# Patient Record
Sex: Male | Born: 1965 | Race: Black or African American | Hispanic: No | Marital: Married
Health system: Southern US, Community
[De-identification: ages and names within clinical notes are randomized; demographics above are authoritative.]

## PROBLEM LIST (undated history)

## (undated) DIAGNOSIS — I517 Cardiomegaly: Secondary | ICD-10-CM

## (undated) DIAGNOSIS — E559 Vitamin D deficiency, unspecified: Secondary | ICD-10-CM

## (undated) DIAGNOSIS — I219 Acute myocardial infarction, unspecified: Secondary | ICD-10-CM

## (undated) DIAGNOSIS — I639 Cerebral infarction, unspecified: Secondary | ICD-10-CM

## (undated) DIAGNOSIS — I10 Essential (primary) hypertension: Secondary | ICD-10-CM

## (undated) HISTORY — PX: ANTERIOR CRUCIATE LIGAMENT REPAIR: SHX115

## (undated) HISTORY — PX: LAPAROTOMY: SHX154

## (undated) HISTORY — PX: GASTRIC RESECTION: SHX5248

## (undated) HISTORY — DX: Cerebral infarction, unspecified: I63.9

---

## 2017-04-27 ENCOUNTER — Inpatient Hospital Stay
Admission: EM | Admit: 2017-04-27 | Discharge: 2017-04-27 | DRG: 304 | Disposition: A | Discharge status: Other Acute Inpatient Hospital | Payer: BC Managed Care – PPO | Attending: Internal Medicine | Admitting: Internal Medicine

## 2017-04-27 ENCOUNTER — Other Ambulatory Visit: Payer: Self-pay

## 2017-04-27 ENCOUNTER — Inpatient Hospital Stay: Admit: 2017-04-27 | Payer: BC Managed Care – PPO

## 2017-04-27 ENCOUNTER — Inpatient Hospital Stay: Payer: BC Managed Care – PPO

## 2017-04-27 ENCOUNTER — Emergency Department: Payer: BC Managed Care – PPO

## 2017-04-27 DIAGNOSIS — I7100 Dissection of unspecified site of aorta: Secondary | ICD-10-CM

## 2017-04-27 DIAGNOSIS — J90 Pleural effusion, not elsewhere classified: Secondary | ICD-10-CM | POA: Diagnosis present

## 2017-04-27 DIAGNOSIS — J810 Acute pulmonary edema: Secondary | ICD-10-CM | POA: Diagnosis not present

## 2017-04-27 DIAGNOSIS — N19 Unspecified kidney failure: Secondary | ICD-10-CM

## 2017-04-27 DIAGNOSIS — Z833 Family history of diabetes mellitus: Secondary | ICD-10-CM

## 2017-04-27 DIAGNOSIS — N179 Acute kidney failure, unspecified: Secondary | ICD-10-CM

## 2017-04-27 DIAGNOSIS — I161 Hypertensive emergency: Secondary | ICD-10-CM | POA: Diagnosis not present

## 2017-04-27 DIAGNOSIS — I10 Essential (primary) hypertension: Secondary | ICD-10-CM

## 2017-04-27 DIAGNOSIS — R778 Other specified abnormalities of plasma proteins: Secondary | ICD-10-CM

## 2017-04-27 DIAGNOSIS — I160 Hypertensive urgency: Secondary | ICD-10-CM | POA: Diagnosis not present

## 2017-04-27 DIAGNOSIS — R7989 Other specified abnormal findings of blood chemistry: Secondary | ICD-10-CM

## 2017-04-27 HISTORY — DX: Essential (primary) hypertension: I10

## 2017-04-27 LAB — CBC WITH DIFFERENTIAL/PLATELET
BASOS ABS: 0.1 10*3/uL (ref 0–0.1)
Basophils Relative: 1 %
Eosinophils Absolute: 0.5 10*3/uL (ref 0–0.7)
Eosinophils Relative: 4 %
HEMATOCRIT: 46.2 % (ref 40.0–52.0)
Hemoglobin: 15 g/dL (ref 13.0–18.0)
LYMPHS PCT: 13 %
Lymphs Abs: 1.5 10*3/uL (ref 1.0–3.6)
MCH: 27.5 pg (ref 26.0–34.0)
MCHC: 32.5 g/dL (ref 32.0–36.0)
MCV: 84.6 fL (ref 80.0–100.0)
MONO ABS: 0.8 10*3/uL (ref 0.2–1.0)
Monocytes Relative: 7 %
Neutro Abs: 8.7 10*3/uL — ABNORMAL HIGH (ref 1.4–6.5)
Neutrophils Relative %: 75 %
Platelets: 230 10*3/uL (ref 150–440)
RBC: 5.45 MIL/uL (ref 4.40–5.90)
RDW: 15.1 % — AB (ref 11.5–14.5)
WBC: 11.6 10*3/uL — ABNORMAL HIGH (ref 3.8–10.6)

## 2017-04-27 LAB — BASIC METABOLIC PANEL
Anion gap: 11 (ref 5–15)
BUN: 40 mg/dL — ABNORMAL HIGH (ref 6–20)
CO2: 18 mmol/L — AB (ref 22–32)
Calcium: 8.1 mg/dL — ABNORMAL LOW (ref 8.9–10.3)
Chloride: 106 mmol/L (ref 101–111)
Creatinine, Ser: 3.94 mg/dL — ABNORMAL HIGH (ref 0.61–1.24)
GFR calc Af Amer: 19 mL/min — ABNORMAL LOW (ref >60)
GFR, EST NON AFRICAN AMERICAN: 16 mL/min — AB (ref >60)
GLUCOSE: 131 mg/dL — AB (ref 65–99)
POTASSIUM: 4.3 mmol/L (ref 3.5–5.1)
Sodium: 135 mmol/L (ref 135–145)

## 2017-04-27 LAB — COMPREHENSIVE METABOLIC PANEL
ALBUMIN: 4.1 g/dL (ref 3.5–5.0)
ALT: 52 U/L (ref 17–63)
AST: 73 U/L — AB (ref 15–41)
Alkaline Phosphatase: 82 U/L (ref 38–126)
Anion gap: 11 (ref 5–15)
BUN: 41 mg/dL — AB (ref 6–20)
CHLORIDE: 108 mmol/L (ref 101–111)
CO2: 21 mmol/L — ABNORMAL LOW (ref 22–32)
Calcium: 8.7 mg/dL — ABNORMAL LOW (ref 8.9–10.3)
Creatinine, Ser: 4.26 mg/dL — ABNORMAL HIGH (ref 0.61–1.24)
GFR calc Af Amer: 17 mL/min — ABNORMAL LOW (ref >60)
GFR, EST NON AFRICAN AMERICAN: 15 mL/min — AB (ref >60)
GLUCOSE: 121 mg/dL — AB (ref 65–99)
POTASSIUM: 3.9 mmol/L (ref 3.5–5.1)
Sodium: 140 mmol/L (ref 135–145)
Total Bilirubin: 0.6 mg/dL (ref 0.3–1.2)
Total Protein: 6.8 g/dL (ref 6.5–8.1)

## 2017-04-27 LAB — TROPONIN I
TROPONIN I: 0.12 ng/mL — AB (ref <0.03)
TROPONIN I: 0.11 ng/mL — AB (ref <0.03)

## 2017-04-27 LAB — CBC
HCT: 43.1 % (ref 40.0–52.0)
Hemoglobin: 13.7 g/dL (ref 13.0–18.0)
MCH: 27 pg (ref 26.0–34.0)
MCHC: 31.7 g/dL — ABNORMAL LOW (ref 32.0–36.0)
MCV: 85.1 fL (ref 80.0–100.0)
PLATELETS: 202 10*3/uL (ref 150–440)
RBC: 5.06 MIL/uL (ref 4.40–5.90)
RDW: 15.2 % — ABNORMAL HIGH (ref 11.5–14.5)
WBC: 11.1 10*3/uL — ABNORMAL HIGH (ref 3.8–10.6)

## 2017-04-27 LAB — HEPARIN LEVEL (UNFRACTIONATED)

## 2017-04-27 LAB — PROTIME-INR
INR: 1
Prothrombin Time: 13.1 seconds (ref 11.4–15.2)

## 2017-04-27 LAB — MRSA PCR SCREENING: MRSA by PCR: NEGATIVE

## 2017-04-27 LAB — GLUCOSE, CAPILLARY: Glucose-Capillary: 117 mg/dL — ABNORMAL HIGH (ref 65–99)

## 2017-04-27 LAB — APTT: APTT: 37 s — AB (ref 24–36)

## 2017-04-27 MED ORDER — ACETAMINOPHEN 650 MG RE SUPP: 650.0000 mg | Freq: Four times a day (QID) | RECTAL | Status: DC | PRN

## 2017-04-27 MED ORDER — ACETAMINOPHEN 500 MG PO TABS
ORAL_TABLET | ORAL | Status: AC
  Administered 2017-04-27: 1000 mg via ORAL
  Filled 2017-04-27: qty 2

## 2017-04-27 MED ORDER — HEPARIN BOLUS VIA INFUSION
4000.0000 [IU] | Freq: Once | INTRAVENOUS | Status: AC
  Administered 2017-04-27: 4000 [IU] via INTRAVENOUS
  Filled 2017-04-27: qty 4000

## 2017-04-27 MED ORDER — LABETALOL HCL 5 MG/ML IV SOLN
20.0000 mg | Freq: Once | INTRAVENOUS | Status: AC
  Administered 2017-04-27: 20 mg via INTRAVENOUS
  Filled 2017-04-27: qty 4

## 2017-04-27 MED ORDER — SENNOSIDES-DOCUSATE SODIUM 8.6-50 MG PO TABS: 1.0000 | ORAL_TABLET | Freq: Every evening | ORAL | Status: DC | PRN

## 2017-04-27 MED ORDER — NICARDIPINE HCL IN NACL 20-0.86 MG/200ML-% IV SOLN
3.0000 mg/h | INTRAVENOUS | Status: DC
  Administered 2017-04-27: 8 mg/h via INTRAVENOUS
  Administered 2017-04-27: 10 mg/h via INTRAVENOUS
  Administered 2017-04-27: 5 mg/h via INTRAVENOUS
  Filled 2017-04-27 (×3): qty 200

## 2017-04-27 MED ORDER — SODIUM CHLORIDE 0.9 % IV SOLN: 250.0000 mL | INTRAVENOUS | Status: DC | PRN

## 2017-04-27 MED ORDER — ASPIRIN EC 81 MG PO TBEC: 81.0000 mg | DELAYED_RELEASE_TABLET | Freq: Every day | ORAL | Status: DC

## 2017-04-27 MED ORDER — ACETAMINOPHEN 500 MG PO TABS
1000.0000 mg | ORAL_TABLET | Freq: Once | ORAL | Status: AC
Start: 2017-04-27 — End: 2017-04-27
  Administered 2017-04-27: 1000 mg via ORAL

## 2017-04-27 MED ORDER — ONDANSETRON HCL 4 MG PO TABS: 4.0000 mg | ORAL_TABLET | Freq: Four times a day (QID) | ORAL | Status: DC | PRN

## 2017-04-27 MED ORDER — HYDRALAZINE HCL 20 MG/ML IJ SOLN: 10.0000 mg | INTRAMUSCULAR | Status: DC | PRN

## 2017-04-27 MED ORDER — SODIUM CHLORIDE 0.9% FLUSH: 3.0000 mL | Freq: Two times a day (BID) | INTRAVENOUS | Status: DC

## 2017-04-27 MED ORDER — ONDANSETRON HCL 4 MG/2ML IJ SOLN: 4.0000 mg | Freq: Four times a day (QID) | INTRAMUSCULAR | Status: DC | PRN

## 2017-04-27 MED ORDER — ACETAMINOPHEN 325 MG PO TABS: 650.0000 mg | ORAL_TABLET | Freq: Four times a day (QID) | ORAL | Status: DC | PRN

## 2017-04-27 MED ORDER — IOPAMIDOL (ISOVUE-370) INJECTION 76%
125.0000 mL | Freq: Once | INTRAVENOUS | Status: AC | PRN
  Administered 2017-04-27: 125 mL via INTRAVENOUS

## 2017-04-27 MED ORDER — NITROGLYCERIN IN D5W 200-5 MCG/ML-% IV SOLN
0.0000 ug/min | INTRAVENOUS | Status: DC
Start: 2017-04-27 — End: 2017-04-27
  Administered 2017-04-27: 5 ug/min via INTRAVENOUS
  Filled 2017-04-27: qty 250

## 2017-04-27 MED ORDER — NITROGLYCERIN IN D5W 200-5 MCG/ML-% IV SOLN: 0.0000 ug/min | INTRAVENOUS | Status: DC

## 2017-04-27 MED ORDER — SODIUM CHLORIDE 0.9% FLUSH: 3.0000 mL | INTRAVENOUS | Status: DC | PRN

## 2017-04-27 MED ORDER — HEPARIN (PORCINE) IN NACL 100-0.45 UNIT/ML-% IJ SOLN
1100.0000 [IU]/h | INTRAMUSCULAR | Status: DC
  Administered 2017-04-27: 1100 [IU]/h via INTRAVENOUS
  Filled 2017-04-27: qty 250

## 2017-04-27 NOTE — Consult Note (Signed)
Name: [name] MRN: [id number] DOB: 05/26/1965    ADMISSION DATE:  04/27/2017 CONSULTATION DATE: 04/27/2016  REFERRING MD : Dr. Estanislado Pandy   CHIEF COMPLAINT: Left arm numbness and tingling   BRIEF PATIENT DESCRIPTION:  52 yo male admitted 01/3 with possible amitriptyline reaction, acute renal failure, elevated troponin, and hypertensive urgency requiring nitroglycerin gtt   SIGNIFICANT EVENTS  01/3-Pt admitted to stepdown unit   STUDIES:  CT Angio Chest/Abd/Pel 01/3>>No evidence of aneurysm or dissection of the thoracic or abdominal aorta. Major aortic branch vessels are unremarkable. No evidence of significant pulmonary embolus. Small bilateral pleural effusions. Interstitial edema in the lung bases. Cardiac enlargement with reflux of contrast material into the hepatic veins suggesting right heart failure. No acute process demonstrated in the abdomen pelvis. CT Head 01/3>>Chronic small vessel ischemia without evidence of acute intracranial abnormality. Chronic right frontal sinusitis.  HISTORY OF PRESENT ILLNESS:   This is a 52 yo male with no PMH denies alcohol or recreation drug use and is not a tobacco user presented to [facility] ER 01/3 with c/o left arm numbness and tingling onset of symptoms 2 hours prior to arrival to the ER.  Per ER notes the pt stated he normally does not take any medications, however 5 days ago he had an upper respiratory infection resulting in insomnia.  Therefore, he went to Urgent Care on 01/2 and was prescribed amitriptyline for sleep.  He took his first dose of amitriptyline on 04/26/17 at 09:00 am and was able to fall asleep.  However, on the morning of 01/3 around 01:00 am he noticed left arm numbness and tingling prompting current ER visit.  In the ER his blood pressure was 255/177 and heart rate 118, therefore nitroglycerin gtt initiated. Lab results revealed the pt was in acute renal failure with creatinine 4.26, initial troponin 0.12, and CXR revealing  pulmonary edema.  He was subsequently admitted to the stepdown unit by hospitalist team for further workup and treatment.   PAST MEDICAL HISTORY :   has no past medical history on file.  has no past surgical history on file. Prior to Admission medications   Medication Sig Start Date End Date Taking? Authorizing Provider  amitriptyline (ELAVIL) 25 MG tablet Take 25 mg by mouth at bedtime.   Yes [provider]   No Known Allergies  FAMILY HISTORY:  family history is not on file. SOCIAL HISTORY:    REVIEW OF SYSTEMS: Positives in BOLD  Constitutional: Negative for fever, chills, weight loss, malaise/fatigue and diaphoresis.  HENT: Negative for hearing loss, ear pain, nosebleeds, congestion, sore throat, neck pain, tinnitus and ear discharge.   Eyes: Negative for blurred vision, double vision, photophobia, pain, discharge and redness.  Respiratory: Negative for cough, hemoptysis, sputum production, shortness of breath, wheezing and stridor.   Cardiovascular: Negative for chest pain, palpitations, orthopnea, claudication, leg swelling and PND.  Gastrointestinal: Negative for heartburn, nausea, vomiting, abdominal pain, diarrhea, constipation, blood in stool and melena.  Genitourinary: Negative for dysuria, urgency, frequency, hematuria and flank pain.  Musculoskeletal: Negative for myalgias, back pain, joint pain and falls.  Skin: Negative for itching and rash.  Neurological: left arm numbness and tingling, dizziness, tingling, tremors, sensory change, speech change, focal weakness, seizures, loss of consciousness, weakness and headaches.  Endo/Heme/Allergies: Negative for environmental allergies and polydipsia. Does not bruise/bleed easily.  SUBJECTIVE:  Pt primary complaint is intermittent headache.  VITAL SIGNS: Temp:  [98.1 F (36.7 C)] 98.1 F (36.7 C) (01/03 0223) Pulse Rate:  [  111-118] 111 (01/03 0300) Resp:  [18-20] 18 (01/03 0300) BP: (247-265)/(177-188) 247/181  (01/03 0300) SpO2:  [97 %-99 %] 97 % (01/03 0300) Weight:  [94.3 kg (208 lb)] 94.3 kg (208 lb) (01/03 0223)  PHYSICAL EXAMINATION: General: well developed, well nourished male, NAD  Neuro: alert and oriented, follows commands  HEENT: supple, no JVD Cardiovascular: sinus tach, no M/R/G Lungs: clear throughout, even, non labored  Abdomen: +BS x4, soft, non distended, non tender  Musculoskeletal: normal bulk and tone, no edema  Skin: intact no rashes or lesions   Recent Labs  Lab 04/27/17 0250  NA 140  K 3.9  CL 108  CO2 21*  BUN 41*  CREATININE 4.26*  GLUCOSE 121*   Recent Labs  Lab 04/27/17 0250  HGB 15.0  HCT 46.2  WBC 11.6*  PLT 230   Ct Head Wo Contrast  Result Date: 04/27/2017 CLINICAL DATA:  Altered level of consciousness. EXAM: CT HEAD WITHOUT CONTRAST TECHNIQUE: Contiguous axial images were obtained from the base of the skull through the vertex without intravenous contrast. COMPARISON:  None. FINDINGS: Brain: Brain volume is normal for age. Periventricular white matter hypodensity is nonspecific, commonly secondary to chronic small vessel ischemia. No intracranial hemorrhage, mass effect, or midline shift. No hydrocephalus. Cavum septum pellucidum, normal variant. The basilar cisterns are patent. No evidence of territorial infarct or acute ischemia. No extra-axial or intracranial fluid collection. Vascular: No hyperdense vessel or unexpected calcification. Skull: No fracture or focal lesion. Sinuses/Orbits: Complete opacification of left frontal sinus with internal septations and mild surrounding bony scleroses, a suggesting chronic sinusitis. Scattered mucosal thickening of the ethmoid air cells. Mastoid air cells are well-aerated. Visualized orbits are unremarkable. Other: None. IMPRESSION: 1. Chronic small vessel ischemia without evidence of acute intracranial abnormality. 2. Chronic right frontal sinusitis. Electronically Signed   By: Jeb Levering M.D.   On: 04/27/2017  03:48   Dg Chest Port 1 View  Result Date: 04/27/2017 CLINICAL DATA:  Initial evaluation for acute chest pain. EXAM: PORTABLE CHEST 1 VIEW COMPARISON:  None available. FINDINGS: Cardiomegaly.  Mediastinal silhouette normal. Lungs mildly hypoinflated. Mild basilar predominant interstitial edema. Suspected trace bilateral pleural effusions. No focal infiltrates. No pneumothorax. Visualized osseous structures within normal limits. IMPRESSION: 1. Cardiomegaly with mild basilar predominant interstitial edema. 2. Suspected small bilateral pleural effusions. Electronically Signed   By: Jeannine Boga M.D.   On: 04/27/2017 03:25   Ct Angio Chest/abd/pel For Dissection W And/or W/wo  Result Date: 04/27/2017 CLINICAL DATA:  Patient fell asleep laying on the left side and when he woke up his left arm was tingling and numb. This lasted for about 30 minutes. No residual symptoms. Elevated blood pressure. Chest pain. EXAM: CT ANGIOGRAPHY CHEST, ABDOMEN AND PELVIS TECHNIQUE: Multidetector CT imaging through the chest, abdomen and pelvis was performed using the standard protocol during bolus administration of intravenous contrast. Multiplanar reconstructed images and MIPs were obtained and reviewed to evaluate the vascular anatomy. CONTRAST:  174mL ISOVUE-370 IOPAMIDOL (ISOVUE-370) INJECTION 76% COMPARISON:  None. FINDINGS: CTA CHEST FINDINGS Cardiovascular: Noncontrast images of the chest demonstrate no evidence of significant aortic calcification or intramural hematoma. Images obtained during arterial phase of contrast injection demonstrate normal caliber thoracic aorta. No aortic dissection. Great vessel origins are patent. Cardiac enlargement. No pericardial effusion. Pulmonary artery's are well opacified. No evidence of significant pulmonary embolus. Mediastinum/Nodes: 1.5 cm diameter low-attenuation in the pretracheal space may represent a prominent lymph node or more likely pericardial reflection. No definite  evidence of significant  mediastinal lymphadenopathy. Esophagus is decompressed. Lungs/Pleura: Small bilateral pleural effusions with basilar atelectasis. Mild interstitial pattern to the lung bases may represent edema. Airways are patent. No pneumothorax. Musculoskeletal: Small lipoma in the right lateral chest wall inferiorly. No acute or significant osseous findings. Review of the MIP images confirms the above findings. CTA ABDOMEN AND PELVIS FINDINGS VASCULAR Aorta: Normal caliber aorta without aneurysm, dissection, vasculitis or significant stenosis. Celiac: Patent without evidence of aneurysm, dissection, vasculitis or significant stenosis. SMA: Patent without evidence of aneurysm, dissection, vasculitis or significant stenosis. Renals: Both renal arteries are patent without evidence of aneurysm, dissection, vasculitis, fibromuscular dysplasia or significant stenosis. IMA: Patent without evidence of aneurysm, dissection, vasculitis or significant stenosis. Inflow: Patent without evidence of aneurysm, dissection, vasculitis or significant stenosis. Veins: No obvious venous abnormality within the limitations of this arterial phase study. Review of the MIP images confirms the above findings. NON-VASCULAR Hepatobiliary: No focal liver abnormality is seen. No gallstones, gallbladder wall thickening, or biliary dilatation. Contrast material refluxed into the hepatic veins may represent right heart failure. Pancreas: Unremarkable. No pancreatic ductal dilatation or surrounding inflammatory changes. Spleen: Normal in size without focal abnormality. Adrenals/Urinary Tract: Adrenal glands are unremarkable. Kidneys are normal, without renal calculi, focal lesion, or hydronephrosis. Bladder is unremarkable. Stomach/Bowel: Stomach is within normal limits. Appendix appears normal. No evidence of bowel wall thickening, distention, or inflammatory changes. Lymphatic: No significant lymphadenopathy. Reproductive: Prostate is  unremarkable. Other: No abdominal wall hernia or abnormality. No abdominopelvic ascites. Musculoskeletal: No acute or significant osseous findings. Review of the MIP images confirms the above findings. IMPRESSION: 1. No evidence of aneurysm or dissection of the thoracic or abdominal aorta. Major aortic branch vessels are unremarkable. No evidence of significant pulmonary embolus. 2. Small bilateral pleural effusions. Interstitial edema in the lung bases. Cardiac enlargement with reflux of contrast material into the hepatic veins suggesting right heart failure. 3. No acute process demonstrated in the abdomen pelvis. Electronically Signed   By: Lucienne Capers M.D.   On: 04/27/2017 03:50    ASSESSMENT / PLAN: Hypertensive Urgency Acute renal failure Pulmonary edema  Possible medication reaction  P: Prn O2 for dyspnea and/or hypoxia Repeat CXR in am  Continuous telemetry monitoring Trend troponin's  Will discontinue nitroglycerin gtt and start nicardipine gtt systolic goal <353 Prn hydralazine to maintain systolic >614 and/or diastolic <431  Continue heparin gtt dosing per pharmacy  Echo pending  Nephrology and Cardiology consulted appreciate input  Trend CBC Monitor for s/sx of bleeding  Transfuse for hgb <7 Trend BMP Replace electrolytes as indicated  Monitor UOP Avoid nephrotoxic medications  Renal US results pending  Marda Stalker, [place] Pager [phone] (please enter 7 digits) PCCM Consult Pager [phone] (please enter 7 digits)

## 2017-04-27 NOTE — Progress Notes (Signed)
ANTICOAGULATION CONSULT NOTE - Initial Consult  Pharmacy Consult for heparin Indication: chest pain/ACS  No Known Allergies  Patient Measurements: Height: 6' (182.9 cm) Weight: 208 lb (94.3 kg) IBW/kg (Calculated) : 77.6 Heparin Dosing Weight: 94.3 kg  Vital Signs: Temp: 98.1 F (36.7 C) (01/03 0223) Temp Source: Oral (01/03 0223) BP: 215/170 (01/03 0435) Pulse Rate: 86 (01/03 0435)  Labs: Recent Labs    04/27/17 0250  HGB 15.0  HCT 46.2  PLT 230  CREATININE 4.26*  TROPONINI 0.12*    Estimated Creatinine Clearance: 23.9 mL/min (A) (by C-G formula based on SCr of 4.26 mg/dL (H)).   Medical History: History reviewed. No pertinent past medical history.  Medications:  Scheduled:  . heparin  4,000 Units Intravenous Once    Assessment: Patient admitted for tingling in right arm after falling asleep, CT negative for acute infarct. Patient is in hypertensive crisis w/ trops up to 0.12.  Patient is being started on heparin  Goal of Therapy:  Heparin level 0.3-0.7 units/ml Monitor platelets by anticoagulation protocol: Yes   Plan:  Will bolus w/ heparin 4000 units IV x 1 Will start drip @ 1100 units/hr  Will draw HL @ 1100. Baseline labs ordered. Will monitor daily CBC's and adjust per HL's  Tobie Lords, PharmD, BCPS Clinical Pharmacist 04/27/2017

## 2017-04-27 NOTE — ED Notes (Addendum)
Pt reports headache. MD made aware

## 2017-04-27 NOTE — Progress Notes (Addendum)
[place] up per [employer] for transfer to [facility] for Cardiovascular Thoracic Surgery consult and treatment. Family with patient. [place], who is out of the country, called and questions answered.

## 2017-04-27 NOTE — ED Notes (Signed)
Nitro titrated to 20 mcg/min.

## 2017-04-27 NOTE — Discharge Summary (Signed)
Physician Discharge Summary  Patient ID: OSCEOLA DEPAZ MRN: [id number] DOB/AGE: May 21, 1965 52 y.o.  Admit date: 04/27/2017 Discharge date: 04/27/2017  Admission Diagnoses:HTN emergency with aortic dissection  Discharge Diagnoses:  Active Problems:   Hypertensive urgency   Discharged Condition: stable  Hospital Course Admitted to ICU Nicardipine infusion    Significant Diagnostic Studies:  CTA chest +aortic dissection  Discharge Exam: Blood pressure 134/87, pulse 88, temperature 98.7 F (37.1 C), resp. rate 15, height 6' (1.829 m), weight 208 lb 1.8 oz (94.4 kg), SpO2 95 %.   Disposition TRANSFER TO [facility]  NICARDIPINE INFUSION BP goal SBP 120  Case discussed with [facility] at [facility], they refused patient transferred, [facility] CT surgery has accepted patient and will be transferred to [facility] ER   Signed: Flora Lipps 04/27/2017, 9:19 AM

## 2017-04-27 NOTE — Consult Note (Signed)
[facility] CONSULT NOTE    Date: 04/27/2017                  Patient Name:  AQEEL NORGAARD  MRN: [id number]  DOB: 1965-10-15  Age / Sex: 52 y.o., male         PCP: Tommy Medal, MD                 Service Requesting Consult: Pulmonary/critical care                 Reason for Consult: Acute renal failure            History of Present Illness: Patient is a 52 y.o. male with no significant past medical hisotry, who was admitted to [facility] on 04/27/2017 for evaluation of hypertensive crisis, left arm numbness.  The patient presented with extremely elevated blood pressure.  The highest blood pressure recording during his inpatient stay was 265/188.  He was subsequently started on a nicardipine drip.  His baseline renal function is unknown however his creatinine is currently 3.9.  Renal ultrasound was performed which showed increased echogenicity bilateral.  Patient also had a CT angiogram of the chest which reveals an unusual pattern for descending aortic dissection.  The patient is currently in the process of being transferred to [facility].  He remains on a nicardipine drip at the moment.  His blood pressure has come down significantly.   Medications: Outpatient medications: Medications Prior to Admission  Medication Sig Dispense Refill Last Dose  . amitriptyline (ELAVIL) 25 MG tablet Take 25 mg by mouth at bedtime.   04/26/2017 at Unknown time    Current medications: Current Facility-Administered Medications  Medication Dose Route Frequency Provider Last Rate Last Dose  . 0.9 %  sodium chloride infusion  250 mL Intravenous PRN Pyreddy, Reatha Harps, MD      . acetaminophen (TYLENOL) tablet 650 mg  650 mg Oral Q6H PRN Pyreddy, Reatha Harps, MD       Or  . acetaminophen (TYLENOL) suppository 650 mg  650 mg Rectal Q6H PRN Saundra Shelling, MD      . aspirin EC tablet 81 mg  81 mg Oral Daily Pyreddy, Pavan, MD      . heparin ADULT infusion 100 units/mL  (25000 units/255mL sodium chloride 0.45%)  1,100 Units/hr Intravenous Continuous Saundra Shelling, MD 11 mL/hr at 04/27/17 0638 1,100 Units/hr at 04/27/17 2703  . hydrALAZINE (APRESOLINE) injection 10 mg  10 mg Intravenous Q4H PRN Awilda Bill, NP      . nicardipine (CARDENE) 20mg  in 0.86% saline 261ml IV infusion (0.1 mg/ml)  3-15 mg/hr Intravenous Continuous Awilda Bill, NP 80 mL/hr at 04/27/17 0800 8 mg/hr at 04/27/17 0800  . ondansetron (ZOFRAN) tablet 4 mg  4 mg Oral Q6H PRN Pyreddy, Reatha Harps, MD       Or  . ondansetron (ZOFRAN) injection 4 mg  4 mg Intravenous Q6H PRN Pyreddy, Pavan, MD      . senna-docusate (Senokot-S) tablet 1 tablet  1 tablet Oral QHS PRN Pyreddy, Pavan, MD      . sodium chloride flush (NS) 0.9 % injection 3 mL  3 mL Intravenous Q12H Pyreddy, Pavan, MD      . sodium chloride flush (NS) 0.9 % injection 3 mL  3 mL Intravenous PRN Saundra Shelling, MD          Allergies: No Known Allergies    Past Medical History: History reviewed. No pertinent past  medical history.   Family History: Family History  Problem Relation Age of Onset  . Diabetes Mellitus II Mother   . Diabetes Mellitus II Father      Social History: Social History   Socioeconomic History  . Marital status: Married    Spouse name: Not on file  . Number of children: Not on file  . Years of education: Not on file  . Highest education level: Not on file  Social Needs  . Financial resource strain: Not on file  . Food insecurity - worry: Not on file  . Food insecurity - inability: Not on file  . Transportation needs - medical: Not on file  . Transportation needs - non-medical: Not on file  Occupational History  . Not on file  Tobacco Use  . Smoking status: Never Smoker  . Smokeless tobacco: Never Used  Substance and Sexual Activity  . Alcohol use: No    Frequency: Never  . Drug use: No  . Sexual activity: Not on file  Other Topics Concern  . Not on file  Social History Narrative   . Not on file     Review of Systems: Review of Systems  Constitutional: Positive for malaise/fatigue. Negative for chills, fever and weight loss.  HENT: Negative for hearing loss and nosebleeds.   Eyes: Negative for blurred vision and double vision.  Respiratory: Negative for cough, hemoptysis and sputum production.   Cardiovascular: Negative for chest pain, orthopnea and leg swelling.  Gastrointestinal: Negative for abdominal pain, heartburn, nausea and vomiting.  Genitourinary: Negative for dysuria, frequency and urgency.  Musculoskeletal: Negative for myalgias and neck pain.  Skin: Negative for itching and rash.  Neurological: Positive for tingling.  Endo/Heme/Allergies: Negative for polydipsia. Does not bruise/bleed easily.  Psychiatric/Behavioral: Negative for depression. The patient is nervous/anxious.      Vital Signs: Blood pressure 132/75, pulse 87, temperature 98.7 F (37.1 C), resp. rate (!) 21, height 6' (1.829 m), weight 94.4 kg (208 lb 1.8 oz), SpO2 96 %.  Weight trends: Filed Weights   04/27/17 0223 04/27/17 0628  Weight: 94.3 kg (208 lb) 94.4 kg (208 lb 1.8 oz)    Physical Exam: General: NAD, sitting up in bed  Head: Normocephalic, atraumatic.  Eyes: Anicteric, EOMI  Nose: Mucous membranes moist, not inflammed, nonerythematous.  Throat: Oropharynx nonerythematous, no exudate appreciated.   Neck: Supple, trachea midline.  Lungs:  Normal respiratory effort, basilar rales  Heart: RRR. S1 and S2 normal without gallop, murmur, or rubs.  Abdomen:  BS normoactive. Soft, Nondistended, non-tender.  No masses or organomegaly.  Extremities: No pretibial edema.  Neurologic: A&O X3, Motor strength is 5/5 in the all 4 extremities  Skin: No visible rashes, scars.    Lab results: Basic Metabolic Panel: Recent Labs  Lab 04/27/17 0250 04/27/17 0623  NA 140 135  K 3.9 4.3  CL 108 106  CO2 21* 18*  GLUCOSE 121* 131*  BUN 41* 40*  CREATININE 4.26* 3.94*   CALCIUM 8.7* 8.1*    Liver Function Tests: Recent Labs  Lab 04/27/17 0250  AST 73*  ALT 52  ALKPHOS 82  BILITOT 0.6  PROT 6.8  ALBUMIN 4.1   No results for input(s): LIPASE, AMYLASE in the last 168 hours. No results for input(s): AMMONIA in the last 168 hours.  CBC: Recent Labs  Lab 04/27/17 0250 04/27/17 0623  WBC 11.6* 11.1*  NEUTROABS 8.7*  --   HGB 15.0 13.7  HCT 46.2 43.1  MCV 84.6 85.1  PLT  230 202    Cardiac Enzymes: Recent Labs  Lab 04/27/17 0250 04/27/17 0623  TROPONINI 0.12* 0.11*    BNP: Invalid input(s): POCBNP  CBG: Recent Labs  Lab 04/27/17 0621  GLUCAP 117*    Microbiology: Results for orders placed or performed during the hospital encounter of 04/27/17  MRSA PCR Screening     Status: None   Collection Time: 04/27/17  6:21 AM  Result Value Ref Range Status   MRSA by PCR NEGATIVE NEGATIVE Final    Comment:        The GeneXpert MRSA Assay (FDA approved for NASAL specimens only), is one component of a comprehensive MRSA colonization surveillance program. It is not intended to diagnose MRSA infection nor to guide or monitor treatment for MRSA infections. Performed at [facility], [address] [place], [place] [address]     Coagulation Studies: Recent Labs    04/27/17 0250  LABPROT 13.1  INR 1.00    Urinalysis: No results for input(s): COLORURINE, LABSPEC, PHURINE, GLUCOSEU, HGBUR, BILIRUBINUR, KETONESUR, PROTEINUR, UROBILINOGEN, NITRITE, LEUKOCYTESUR in the last 72 hours.  Invalid input(s): APPERANCEUR    Imaging: Ct Head Wo Contrast  Result Date: 04/27/2017 CLINICAL DATA:  Altered level of consciousness. EXAM: CT HEAD WITHOUT CONTRAST TECHNIQUE: Contiguous axial images were obtained from the base of the skull through the vertex without intravenous contrast. COMPARISON:  None. FINDINGS: Brain: Brain volume is normal for age. Periventricular white matter hypodensity is nonspecific, commonly secondary to  chronic small vessel ischemia. No intracranial hemorrhage, mass effect, or midline shift. No hydrocephalus. Cavum septum pellucidum, normal variant. The basilar cisterns are patent. No evidence of territorial infarct or acute ischemia. No extra-axial or intracranial fluid collection. Vascular: No hyperdense vessel or unexpected calcification. Skull: No fracture or focal lesion. Sinuses/Orbits: Complete opacification of left frontal sinus with internal septations and mild surrounding bony scleroses, a suggesting chronic sinusitis. Scattered mucosal thickening of the ethmoid air cells. Mastoid air cells are well-aerated. Visualized orbits are unremarkable. Other: None. IMPRESSION: 1. Chronic small vessel ischemia without evidence of acute intracranial abnormality. 2. Chronic right frontal sinusitis. Electronically Signed   By: Jeb Levering M.D.   On: 04/27/2017 03:48   US Renal  Result Date: 04/27/2017 CLINICAL DATA:  Renal failure. EXAM: RENAL / URINARY TRACT ULTRASOUND COMPLETE COMPARISON:  None. FINDINGS: Right Kidney: Length: 9.7 cm. Increased renal echogenicity. Small cyst in the mid lateral lower pole measure less than 1 cm. No solid mass or hydronephrosis visualized. Left Kidney: Length: 9.6 cm. Increased renal echogenicity. No mass or hydronephrosis visualized. Bladder: Appears normal for degree of bladder distention. Both ureteral jets are visualized. Bilateral pleural effusions. IMPRESSION: 1. Increased bilateral renal echogenicity suggesting chronic medical renal disease. 2. No obstructive uropathy. Electronically Signed   By: Jeb Levering M.D.   On: 04/27/2017 06:39   Dg Chest Port 1 View  Result Date: 04/27/2017 CLINICAL DATA:  Initial evaluation for acute chest pain. EXAM: PORTABLE CHEST 1 VIEW COMPARISON:  None available. FINDINGS: Cardiomegaly.  Mediastinal silhouette normal. Lungs mildly hypoinflated. Mild basilar predominant interstitial edema. Suspected trace bilateral pleural  effusions. No focal infiltrates. No pneumothorax. Visualized osseous structures within normal limits. IMPRESSION: 1. Cardiomegaly with mild basilar predominant interstitial edema. 2. Suspected small bilateral pleural effusions. Electronically Signed   By: Jeannine Boga M.D.   On: 04/27/2017 03:25   Ct Angio Chest/abd/pel For Dissection W And/or W/wo  Addendum Date: 04/27/2017   ADDENDUM REPORT: 04/27/2017 08:18 ADDENDUM: Contrast within the descending thoracic aorta  and abdominal aorta has an unusual pattern and suggestive for an atypical aortic dissection. There is suggestion for a dissection flap involving the celiac trunk on sequence 5, image 100. There is also unusual low-density within the aorta near the celiac trunk and SMA on images 94 and 102. There may also be a dissection flap extending into the main right renal artery. Concern for dissection extending into the common iliac arteries bilaterally. Sagittal images suggest a dissection flap in the aortic arch on sequence 8, image 90. Difficult to exclude dissection involving the aortic root or ascending thoracic aorta due to the atypical appearance of the dissection elsewhere in the aorta. Consider further evaluation of the ascending thoracic aorta with echocardiography or ECG gated CTA or MRA. In summary, findings are highly concerning for an atypical aortic dissection involving the arch, descending thoracic aorta, abdominal aorta and iliac arteries. Difficult to exclude involvement of the ascending thoracic aorta due to the atypical appearance of this dissection. Critical Value/emergent results were called by telephone at the time of interpretation on 04/27/2017 at 8:17 am to Dr. Flora Lipps, who verbally acknowledged these results. Electronically Signed   By: Markus Daft M.D.   On: 04/27/2017 08:18   Result Date: 04/27/2017 CLINICAL DATA:  Patient fell asleep laying on the left side and when he woke up his left arm was tingling and numb. This lasted  for about 30 minutes. No residual symptoms. Elevated blood pressure. Chest pain. EXAM: CT ANGIOGRAPHY CHEST, ABDOMEN AND PELVIS TECHNIQUE: Multidetector CT imaging through the chest, abdomen and pelvis was performed using the standard protocol during bolus administration of intravenous contrast. Multiplanar reconstructed images and MIPs were obtained and reviewed to evaluate the vascular anatomy. CONTRAST:  176mL ISOVUE-370 IOPAMIDOL (ISOVUE-370) INJECTION 76% COMPARISON:  None. FINDINGS: CTA CHEST FINDINGS Cardiovascular: Noncontrast images of the chest demonstrate no evidence of significant aortic calcification or intramural hematoma. Images obtained during arterial phase of contrast injection demonstrate normal caliber thoracic aorta. No aortic dissection. Great vessel origins are patent. Cardiac enlargement. No pericardial effusion. Pulmonary artery's are well opacified. No evidence of significant pulmonary embolus. Mediastinum/Nodes: 1.5 cm diameter low-attenuation in the pretracheal space may represent a prominent lymph node or more likely pericardial reflection. No definite evidence of significant mediastinal lymphadenopathy. Esophagus is decompressed. Lungs/Pleura: Small bilateral pleural effusions with basilar atelectasis. Mild interstitial pattern to the lung bases may represent edema. Airways are patent. No pneumothorax. Musculoskeletal: Small lipoma in the right lateral chest wall inferiorly. No acute or significant osseous findings. Review of the MIP images confirms the above findings. CTA ABDOMEN AND PELVIS FINDINGS VASCULAR Aorta: Normal caliber aorta without aneurysm, dissection, vasculitis or significant stenosis. Celiac: Patent without evidence of aneurysm, dissection, vasculitis or significant stenosis. SMA: Patent without evidence of aneurysm, dissection, vasculitis or significant stenosis. Renals: Both renal arteries are patent without evidence of aneurysm, dissection, vasculitis, fibromuscular  dysplasia or significant stenosis. IMA: Patent without evidence of aneurysm, dissection, vasculitis or significant stenosis. Inflow: Patent without evidence of aneurysm, dissection, vasculitis or significant stenosis. Veins: No obvious venous abnormality within the limitations of this arterial phase study. Review of the MIP images confirms the above findings. NON-VASCULAR Hepatobiliary: No focal liver abnormality is seen. No gallstones, gallbladder wall thickening, or biliary dilatation. Contrast material refluxed into the hepatic veins may represent right heart failure. Pancreas: Unremarkable. No pancreatic ductal dilatation or surrounding inflammatory changes. Spleen: Normal in size without focal abnormality. Adrenals/Urinary Tract: Adrenal glands are unremarkable. Kidneys are normal, without renal calculi, focal lesion, or  hydronephrosis. Bladder is unremarkable. Stomach/Bowel: Stomach is within normal limits. Appendix appears normal. No evidence of bowel wall thickening, distention, or inflammatory changes. Lymphatic: No significant lymphadenopathy. Reproductive: Prostate is unremarkable. Other: No abdominal wall hernia or abnormality. No abdominopelvic ascites. Musculoskeletal: No acute or significant osseous findings. Review of the MIP images confirms the above findings. IMPRESSION: 1. No evidence of aneurysm or dissection of the thoracic or abdominal aorta. Major aortic branch vessels are unremarkable. No evidence of significant pulmonary embolus. 2. Small bilateral pleural effusions. Interstitial edema in the lung bases. Cardiac enlargement with reflux of contrast material into the hepatic veins suggesting right heart failure. 3. No acute process demonstrated in the abdomen pelvis. Electronically Signed: By: Lucienne Capers M.D. On: 04/27/2017 03:50      Assessment & Plan: Pt is a 52 y.o. male with no significant past medical hisotry, who was admitted to [facility] on 04/27/2017 for evaluation of hypertensive  crisis, left arm numbness.  1.  Acute renal failure, though suspect some element of chronicity. 2.  Hypertensive crisis, on nicardipine drip. 3.  Descending aortic dissection. 4.  Elevated troponin.   Plan:  We were asked to see the patient for acute renal failure.  This is in light of hypertensive crisis which is likely contributing to some element of renal insufficiency.  He will need monitoring of his renal function as he was exposed to contrast during this admission.  No urgent indication for dialysis at the moment.  He will require outpatient follow-up for his acute renal failure and suspected underlying chronic kidney disease as well.  He is in the process of being transferred to [facility].  I did advise the patient to follow-up in our office after discharge.  He verbalized understanding of this.  Continue nicardipine during the transfer process to [facility].  Thanks for consultation.

## 2017-04-27 NOTE — ED Provider Notes (Signed)
[facility] Emergency Department Provider Note  ____________________________________________   First MD Initiated Contact with Patient 04/27/17 0235     (approximate)  I have reviewed the triage vital signs and the nursing notes.   HISTORY  Chief Complaint Medication Reaction    HPI [name] is a 52 y.o. male who self presents to the emergency department with numbness and tingling to his left arm that began roughly 2 hours prior to arrival.  The patient has no past medical history and takes no medications normally.  He says for the past 5 days ago he has had an upper respiratory tract infection and has had difficulty going to sleep.  He went to an urgent care yesterday who prescribed him amitriptyline.  This morning at 9 he took his first dose of amitriptyline and he was able to get some sleep today.  He woke up this morning and 1 in the morning and he thought that he may have slept on his arm wrong because his arm was numb.  His symptoms were sudden onset when he awoke and have gradually improved on their own.  They seem to be worse by moving and somewhat improved with rest.  He denies chest pain or shortness of breath.  He denies abdominal pain nausea or vomiting.  He denies double vision or blurred vision.  He denies headache.  History reviewed. No pertinent past medical history.  There are no active problems to display for this patient.     Prior to Admission medications   Not on File    Allergies Patient has no known allergies.  No family history on file.  Social History Social History   Tobacco Use  . Smoking status: Not on file  Substance Use Topics  . Alcohol use: Not on file  . Drug use: Not on file    Review of Systems Constitutional: No fever/chills Eyes: No visual changes. ENT: No sore throat. Cardiovascular: Denies chest pain. Respiratory: Denies shortness of breath. Gastrointestinal: No abdominal pain.  No nausea, no  vomiting.  No diarrhea.  No constipation. Genitourinary: Negative for dysuria. Musculoskeletal: Negative for back pain. Skin: Negative for rash. Neurological: Positive for numbness and tingling   ____________________________________________   PHYSICAL EXAM:  VITAL SIGNS: ED Triage Vitals  Enc Vitals Group     BP 04/27/17 0225 (!) 255/177     Pulse Rate 04/27/17 0223 (!) 118     Resp 04/27/17 0223 20     Temp 04/27/17 0223 98.1 F (36.7 C)     Temp Source 04/27/17 0223 Oral     SpO2 04/27/17 0223 98 %     Weight 04/27/17 0223 208 lb (94.3 kg)     Height 04/27/17 0223 6' (1.829 m)     Head Circumference --      Peak Flow --      Pain Score --      Pain Loc --      Pain Edu? --      Excl. in [place]? --     Constitutional: Alert and oriented x4 pleasant cooperative speaks in full clear sentences no diaphoresis Head: Atraumatic. Nose: No congestion/rhinnorhea. Mouth/Throat: No trismus Neck: No stridor.   Cardiovascular: Tachycardic regular rhythm no murmurs Respiratory: Slightly increased respiratory effort with crackles at bilateral bases Gastrointestinal: Soft nontender Neurologic:  Normal speech and language. No gross focal neurologic deficits are appreciated.  Skin:  Skin is warm, dry and intact. No rash noted.    ____________________________________________  LABS (all labs ordered are listed, but only abnormal results are displayed)  Labs Reviewed  COMPREHENSIVE METABOLIC PANEL - Abnormal; Notable for the following components:      Result Value   CO2 21 (*)    Glucose, Bld 121 (*)    BUN 41 (*)    Creatinine, Ser 4.26 (*)    Calcium 8.7 (*)    AST 73 (*)    GFR calc non Af Amer 15 (*)    GFR calc Af Amer 17 (*)    All other components within normal limits  TROPONIN I - Abnormal; Notable for the following components:   Troponin I 0.12 (*)    All other components within normal limits  CBC WITH DIFFERENTIAL/PLATELET - Abnormal; Notable for the following  components:   WBC 11.6 (*)    RDW 15.1 (*)    Neutro Abs 8.7 (*)    All other components within normal limits    Lab work reviewed by me with acute kidney injury and elevated troponin __________________________________________  EKG  ED ECG REPORT I, Darel Hong, the attending physician, personally viewed and interpreted this ECG.  Date: 04/27/2017 EKG Time:  Rate: 111 Rhythm: Sinus tachycardia QRS Axis: normal Intervals: normal ST/T Wave abnormalities: Left ventricular hypertrophy with repolarization abnormalities.  He does have some inferior ST depression likely secondary to demand Narrative Interpretation: No obvious ST elevation although he does appear to have ST depression concerning for demand  ____________________________________________  RADIOLOGY  Chest x-ray reviewed by me with acute pulmonary edema CT angios reviewed by me with pulmonary edema and bilateral pleural effusions but no dissection Head CT reviewed by me with no acute disease ____________________________________________   DIFFERENTIAL includes but not limited to  Acute pulmonary edema, aortic dissection, acute kidney injury, medication reaction   PROCEDURES  Procedure(s) performed: no  .Critical Care Performed by: Darel Hong, MD Authorized by: Darel Hong, MD   Critical care provider statement:    Critical care time (minutes):  40   Critical care time was exclusive of:  Separately billable procedures and treating other patients   Critical care was necessary to treat or prevent imminent or life-threatening deterioration of the following conditions:  Renal failure and circulatory failure   Critical care was time spent personally by me on the following activities:  Development of treatment plan with patient or surrogate, discussions with consultants, evaluation of patient's response to treatment, examination of patient, obtaining history from patient or surrogate, ordering and  performing treatments and interventions, ordering and review of laboratory studies, ordering and review of radiographic studies, pulse oximetry, re-evaluation of patient's condition and review of old charts    Critical Care performed: Yes  Observation: no ____________________________________________   INITIAL IMPRESSION / ASSESSMENT AND PLAN / ED COURSE  Pertinent labs & imaging results that were available during my care of the patient were reviewed by me and considered in my medical decision making (see chart for details).  On arrival the patient is quite well-appearing although with some crackles at bilateral bases and she is extraordinarily hypertensive and tachycardic.  His extreme hypertension and left arm symptoms does raise concern for acute aortic dissection so I will get a CT angiogram prior to blood work.  The patient's CT of his head and his chest appear normal to me.  His blood work is extremely abnormal with acute kidney injury and elevated troponin raising concern for hypertensive emergency.  As he does have pulmonary edema as well as start him on  a nitro drip and he will require inpatient admission.  The patient verbalizes understanding and agree with the plan.  I discussed with the hospitalist who is graciously agreed to admit the patient to his service.      ____________________________________________   FINAL CLINICAL IMPRESSION(S) / ED DIAGNOSES  Final diagnoses:  Hypertension, unspecified type  Acute pulmonary edema (HCC)  Elevated troponin  Acute kidney injury ([place])      NEW MEDICATIONS STARTED DURING THIS VISIT:  This SmartLink is deprecated. Use AVSMEDLIST instead to display the medication list for a patient.   Note:  This document was prepared using Dragon voice recognition software and may include unintentional dictation errors.      Darel Hong, MD 04/27/17 [id number]

## 2017-04-27 NOTE — ED Triage Notes (Signed)
Pt started on amitriptyline today took it at 1400 and fell asleep. States was laying on left side and when woke up his left arm felt tingly and numb, states lasted for about 30 min. States no symptoms at this time, just worried about the sensation.

## 2017-04-27 NOTE — ED Notes (Signed)
Per Dr. Mable Paris, no additional treatment needed for elevated labs related to kidney function (CT contrast administered prior to lab results previously per Dr. Mable Paris) after CT contrast.

## 2017-04-27 NOTE — ED Notes (Signed)
Nitro titrated up to 10 mcg/min.

## 2017-04-27 NOTE — ED Notes (Signed)
Nitro titrated to 25 mcg/min.

## 2017-04-27 NOTE — ED Notes (Signed)
Patient transported to Ultrasound 

## 2017-04-27 NOTE — H&P (Addendum)
[facility] [place] NAME: [name]    MR#:  [id number]  DATE OF BIRTH:  22-May-1965  DATE OF ADMISSION:  04/27/2017  PRIMARY CARE PHYSICIAN: Tommy Medal, MD   REQUESTING/REFERRING PHYSICIAN:   CHIEF COMPLAINT:   Chief Complaint  Patient presents with  . Medication Reaction    HISTORY OF PRESENT ILLNESS: [name]  is a 52 y.o. male with no significant past medical history presented to the emergency room for possible medication reaction.  Patient went to urgent care clinic for upper respiratory tract infection 5 days ago.  He was prescribed oral amitriptyline.  After he took the first dose yesterday morning he was able to get some sleep.  He woke up this morning and around 1 AM he thought it he slept on his left arm and his left arm was numb.  He presented to the emergency room with above complaints.  No weakness in any part of the body.  The numbness in the left arm resolved after he came to the emergency room. No complaints of any slurred speech or any difficulty swallowing food.  His blood pressure was high systolic more than 789 and diastolic around 381 mmHg.  Patient was started on IV nitroglycerin drip for uncontrolled hypertension.  Workup in the emergency room also was revealed renal failure.  No complaints of any palpitations, chest pain, blurry vision, denies any headache.  Hospitalist service was consulted for further care.  First set of troponin was also elevated.  CT angiogram of chest showed no dissection and no pulmonary embolism.  Bilateral pleural effusions were noted.  PAST MEDICAL HISTORY:   No history of CAD, diabetes, copd  PAST SURGICAL HISTORY:  Anterior cruciate ligament repair Laparotomy  SOCIAL HISTORY:  Social History   Tobacco Use  . Smoking status: Never Smoker  . Smokeless tobacco: Never Used  Substance Use Topics  . Alcohol use: No    Frequency: Never    FAMILY HISTORY:  Family  History  Problem Relation Age of Onset  . Diabetes Mellitus II Mother   . Diabetes Mellitus II Father     DRUG ALLERGIES: No Known Allergies  REVIEW OF SYSTEMS:   CONSTITUTIONAL: No fever, fatigue or weakness.  EYES: No blurred or double vision.  EARS, NOSE, AND THROAT: No tinnitus or ear pain.  RESPIRATORY: No cough, shortness of breath, wheezing or hemoptysis.  CARDIOVASCULAR: No chest pain, orthopnea, edema.  GASTROINTESTINAL: No nausea, vomiting, diarrhea or abdominal pain.  GENITOURINARY: No dysuria, hematuria.  ENDOCRINE: No polyuria, nocturia,  HEMATOLOGY: No anemia, easy bruising or bleeding SKIN: No rash or lesion. MUSCULOSKELETAL: No joint pain or arthritis.   NEUROLOGIC: No tingling,left arm numbness,  No weakness.  PSYCHIATRY: No anxiety or depression.   MEDICATIONS AT HOME:  Prior to Admission medications   Medication Sig Start Date End Date Taking? Authorizing Provider  amitriptyline (ELAVIL) 25 MG tablet Take 25 mg by mouth at bedtime.   Yes [provider]      PHYSICAL EXAMINATION:   VITAL SIGNS: Blood pressure (!) 215/170, pulse 86, temperature 98.1 F (36.7 C), temperature source Oral, resp. rate (!) 22, height 6' (1.829 m), weight 94.3 kg (208 lb), SpO2 99 %.  GENERAL:  52 y.o.-year-old patient lying in the bed with no acute distress.  EYES: Pupils equal, round, reactive to light and accommodation. No scleral icterus. Extraocular muscles intact.  HEENT: Head atraumatic, normocephalic. Oropharynx and nasopharynx clear.  NECK:  Supple, no jugular venous distention. No thyroid enlargement, no tenderness.  LUNGS: Normal breath sounds bilaterally, no wheezing, rales,rhonchi or crepitation. No use of accessory muscles of respiration.  CARDIOVASCULAR: S1, S2 normal. No murmurs, rubs, or gallops.  ABDOMEN: Soft, nontender, nondistended. Bowel sounds present. No organomegaly or mass.  EXTREMITIES: No pedal edema, cyanosis, or clubbing.  NEUROLOGIC:  Cranial nerves II through XII are intact. Muscle strength 5/5 in all extremities. Sensation intact. Gait not checked.  PSYCHIATRIC: The patient is alert and oriented x 3.  SKIN: No obvious rash, lesion, or ulcer.   LABORATORY PANEL:   CBC Recent Labs  Lab 04/27/17 0250  WBC 11.6*  HGB 15.0  HCT 46.2  PLT 230  MCV 84.6  MCH 27.5  MCHC 32.5  RDW 15.1*  LYMPHSABS 1.5  MONOABS 0.8  EOSABS 0.5  BASOSABS 0.1   ------------------------------------------------------------------------------------------------------------------  Chemistries  Recent Labs  Lab 04/27/17 0250  NA 140  K 3.9  CL 108  CO2 21*  GLUCOSE 121*  BUN 41*  CREATININE 4.26*  CALCIUM 8.7*  AST 73*  ALT 52  ALKPHOS 82  BILITOT 0.6   ------------------------------------------------------------------------------------------------------------------ estimated creatinine clearance is 23.9 mL/min (A) (by C-G formula based on SCr of 4.26 mg/dL (H)). ------------------------------------------------------------------------------------------------------------------ No results for input(s): TSH, T4TOTAL, T3FREE, THYROIDAB in the last 72 hours.  Invalid input(s): FREET3   Coagulation profile Recent Labs  Lab 04/27/17 0250  INR 1.00   ------------------------------------------------------------------------------------------------------------------- No results for input(s): DDIMER in the last 72 hours. -------------------------------------------------------------------------------------------------------------------  Cardiac Enzymes Recent Labs  Lab 04/27/17 0250  TROPONINI 0.12*   ------------------------------------------------------------------------------------------------------------------ Invalid input(s): POCBNP  ---------------------------------------------------------------------------------------------------------------  Urinalysis No results found for: COLORURINE, APPEARANCEUR, LABSPEC,  PHURINE, GLUCOSEU, HGBUR, BILIRUBINUR, KETONESUR, PROTEINUR, UROBILINOGEN, NITRITE, LEUKOCYTESUR   RADIOLOGY: Ct Head Wo Contrast  Result Date: 04/27/2017 CLINICAL DATA:  Altered level of consciousness. EXAM: CT HEAD WITHOUT CONTRAST TECHNIQUE: Contiguous axial images were obtained from the base of the skull through the vertex without intravenous contrast. COMPARISON:  None. FINDINGS: Brain: Brain volume is normal for age. Periventricular white matter hypodensity is nonspecific, commonly secondary to chronic small vessel ischemia. No intracranial hemorrhage, mass effect, or midline shift. No hydrocephalus. Cavum septum pellucidum, normal variant. The basilar cisterns are patent. No evidence of territorial infarct or acute ischemia. No extra-axial or intracranial fluid collection. Vascular: No hyperdense vessel or unexpected calcification. Skull: No fracture or focal lesion. Sinuses/Orbits: Complete opacification of left frontal sinus with internal septations and mild surrounding bony scleroses, a suggesting chronic sinusitis. Scattered mucosal thickening of the ethmoid air cells. Mastoid air cells are well-aerated. Visualized orbits are unremarkable. Other: None. IMPRESSION: 1. Chronic small vessel ischemia without evidence of acute intracranial abnormality. 2. Chronic right frontal sinusitis. Electronically Signed   By: Jeb Levering M.D.   On: 04/27/2017 03:48   Dg Chest Port 1 View  Result Date: 04/27/2017 CLINICAL DATA:  Initial evaluation for acute chest pain. EXAM: PORTABLE CHEST 1 VIEW COMPARISON:  None available. FINDINGS: Cardiomegaly.  Mediastinal silhouette normal. Lungs mildly hypoinflated. Mild basilar predominant interstitial edema. Suspected trace bilateral pleural effusions. No focal infiltrates. No pneumothorax. Visualized osseous structures within normal limits. IMPRESSION: 1. Cardiomegaly with mild basilar predominant interstitial edema. 2. Suspected small bilateral pleural effusions.  Electronically Signed   By: Jeannine Boga M.D.   On: 04/27/2017 03:25   Ct Angio Chest/abd/pel For Dissection W And/or W/wo  Result Date: 04/27/2017 CLINICAL DATA:  Patient fell asleep laying on the left side and when he woke up his left  arm was tingling and numb. This lasted for about 30 minutes. No residual symptoms. Elevated blood pressure. Chest pain. EXAM: CT ANGIOGRAPHY CHEST, ABDOMEN AND PELVIS TECHNIQUE: Multidetector CT imaging through the chest, abdomen and pelvis was performed using the standard protocol during bolus administration of intravenous contrast. Multiplanar reconstructed images and MIPs were obtained and reviewed to evaluate the vascular anatomy. CONTRAST:  163mL ISOVUE-370 IOPAMIDOL (ISOVUE-370) INJECTION 76% COMPARISON:  None. FINDINGS: CTA CHEST FINDINGS Cardiovascular: Noncontrast images of the chest demonstrate no evidence of significant aortic calcification or intramural hematoma. Images obtained during arterial phase of contrast injection demonstrate normal caliber thoracic aorta. No aortic dissection. Great vessel origins are patent. Cardiac enlargement. No pericardial effusion. Pulmonary artery's are well opacified. No evidence of significant pulmonary embolus. Mediastinum/Nodes: 1.5 cm diameter low-attenuation in the pretracheal space may represent a prominent lymph node or more likely pericardial reflection. No definite evidence of significant mediastinal lymphadenopathy. Esophagus is decompressed. Lungs/Pleura: Small bilateral pleural effusions with basilar atelectasis. Mild interstitial pattern to the lung bases may represent edema. Airways are patent. No pneumothorax. Musculoskeletal: Small lipoma in the right lateral chest wall inferiorly. No acute or significant osseous findings. Review of the MIP images confirms the above findings. CTA ABDOMEN AND PELVIS FINDINGS VASCULAR Aorta: Normal caliber aorta without aneurysm, dissection, vasculitis or significant stenosis.  Celiac: Patent without evidence of aneurysm, dissection, vasculitis or significant stenosis. SMA: Patent without evidence of aneurysm, dissection, vasculitis or significant stenosis. Renals: Both renal arteries are patent without evidence of aneurysm, dissection, vasculitis, fibromuscular dysplasia or significant stenosis. IMA: Patent without evidence of aneurysm, dissection, vasculitis or significant stenosis. Inflow: Patent without evidence of aneurysm, dissection, vasculitis or significant stenosis. Veins: No obvious venous abnormality within the limitations of this arterial phase study. Review of the MIP images confirms the above findings. NON-VASCULAR Hepatobiliary: No focal liver abnormality is seen. No gallstones, gallbladder wall thickening, or biliary dilatation. Contrast material refluxed into the hepatic veins may represent right heart failure. Pancreas: Unremarkable. No pancreatic ductal dilatation or surrounding inflammatory changes. Spleen: Normal in size without focal abnormality. Adrenals/Urinary Tract: Adrenal glands are unremarkable. Kidneys are normal, without renal calculi, focal lesion, or hydronephrosis. Bladder is unremarkable. Stomach/Bowel: Stomach is within normal limits. Appendix appears normal. No evidence of bowel wall thickening, distention, or inflammatory changes. Lymphatic: No significant lymphadenopathy. Reproductive: Prostate is unremarkable. Other: No abdominal wall hernia or abnormality. No abdominopelvic ascites. Musculoskeletal: No acute or significant osseous findings. Review of the MIP images confirms the above findings. IMPRESSION: 1. No evidence of aneurysm or dissection of the thoracic or abdominal aorta. Major aortic branch vessels are unremarkable. No evidence of significant pulmonary embolus. 2. Small bilateral pleural effusions. Interstitial edema in the lung bases. Cardiac enlargement with reflux of contrast material into the hepatic veins suggesting right heart  failure. 3. No acute process demonstrated in the abdomen pelvis. Electronically Signed   By: Lucienne Capers M.D.   On: 04/27/2017 03:50    EKG: Orders placed or performed during the hospital encounter of 04/27/17  . ED EKG  . ED EKG  . EKG 12-Lead  . EKG 12-Lead    IMPRESSION AND PLAN: 52 year old male patient with no significant past medical history presented to the emergency room with elevated blood pressure.  Admitting diagnosis 1.  Hypertensive urgency 2.  Acute renal failure 3.  Elevated troponin 4.  Pleural effusions Treatment plan Admit patient to stepdown unit Check echocardiogram to assess for heart failure Control blood pressure with IV nitroglycerin drip Cycle troponin IV  heparin drip for anticoagulation Renal ultrasound Nephrology consultation Cardiology and intensivist consultation Intensivist on call notiifed  All the records are reviewed and case discussed with ED provider. Management plans discussed with the patient, family and they are in agreement.  CODE STATUS: Full code Code Status History    This patient does not have a recorded code status. Please follow your organizational policy for patients in this situation.       TOTAL CRITICAL CARE TIME TAKING CARE OF THIS PATIENT: 1 minutes   Pavan Pyreddy M.D on 04/27/2017 at 5:01 AM  Between 7am to 6pm - Pager - [phone]  After 6pm go to [email or web address] - password [email or web address] [facility]  Office  [phone]  CC: Primary care physician; Tommy Medal, MD

## 2017-04-27 NOTE — ED Notes (Signed)
Nitro titrated to 5mcg/min.

## 2017-04-27 NOTE — Progress Notes (Signed)
I have not seen the patient.  Patient got transferred to [facility] for aortic dissection today

## 2017-04-27 NOTE — Progress Notes (Addendum)
0830 Informed via radiologist that patient has an ascending aortic aneurism. Dr. Mortimer Fries spoke with radiologist to confirm. After being informed by Dr. Mortimer Fries of his issues patient reassured that he would receive thye best of care. All clothes ,jewelry wallet etc sent with dad home. Wife is presently out of the country. 0930 report given to life flight Customer service manager.

## 2017-04-27 NOTE — ED Notes (Signed)
Called lab to add on PT and PTT.

## 2017-04-28 LAB — HIV ANTIBODY (ROUTINE TESTING W REFLEX): HIV SCREEN 4TH GENERATION: NONREACTIVE

## 2017-04-28 MED ORDER — CARVEDILOL 12.5 MG PO TABS
12.50 | ORAL_TABLET | ORAL | Status: DC
Start: 2017-04-28 — End: 2017-04-28

## 2017-04-28 MED ORDER — LIDOCAINE HCL (PF) 1 % IJ SOLN
.50 | INTRAMUSCULAR | Status: DC
Start: ? — End: 2017-04-28

## 2017-07-28 ENCOUNTER — Encounter: Payer: Self-pay | Admitting: *Deleted

## 2017-07-28 ENCOUNTER — Other Ambulatory Visit: Payer: Self-pay

## 2017-07-28 ENCOUNTER — Emergency Department
Admission: EM | Admit: 2017-07-28 | Discharge: 2017-07-28 | Disposition: A | Discharge status: Home / Self Care | Payer: BC Managed Care – PPO | Attending: Emergency Medicine | Admitting: Emergency Medicine

## 2017-07-28 ENCOUNTER — Emergency Department: Payer: BC Managed Care – PPO

## 2017-07-28 DIAGNOSIS — I10 Essential (primary) hypertension: Secondary | ICD-10-CM | POA: Insufficient documentation

## 2017-07-28 DIAGNOSIS — Y9301 Activity, walking, marching and hiking: Secondary | ICD-10-CM | POA: Diagnosis not present

## 2017-07-28 DIAGNOSIS — S63283A Dislocation of proximal interphalangeal joint of left middle finger, initial encounter: Secondary | ICD-10-CM | POA: Insufficient documentation

## 2017-07-28 DIAGNOSIS — W010XXA Fall on same level from slipping, tripping and stumbling without subsequent striking against object, initial encounter: Secondary | ICD-10-CM | POA: Diagnosis not present

## 2017-07-28 DIAGNOSIS — Y998 Other external cause status: Secondary | ICD-10-CM | POA: Diagnosis not present

## 2017-07-28 DIAGNOSIS — Z79899 Other long term (current) drug therapy: Secondary | ICD-10-CM | POA: Insufficient documentation

## 2017-07-28 DIAGNOSIS — S63259A Unspecified dislocation of unspecified finger, initial encounter: Secondary | ICD-10-CM

## 2017-07-28 DIAGNOSIS — S6992XA Unspecified injury of left wrist, hand and finger(s), initial encounter: Secondary | ICD-10-CM | POA: Diagnosis present

## 2017-07-28 DIAGNOSIS — Y9226 Movie house or cinema as the place of occurrence of the external cause: Secondary | ICD-10-CM | POA: Insufficient documentation

## 2017-07-28 MED ORDER — LIDOCAINE HCL (PF) 1 % IJ SOLN: 5.0000 mL | Freq: Once | INTRAMUSCULAR | Status: DC

## 2017-07-28 MED ORDER — LIDOCAINE HCL (PF) 1 % IJ SOLN
INTRAMUSCULAR | Status: AC
  Filled 2017-07-28: qty 5

## 2017-07-28 MED ORDER — TRAMADOL HCL 50 MG PO TABS: 50.0000 mg | ORAL_TABLET | Freq: Four times a day (QID) | ORAL | 0 refills | Status: DC | PRN

## 2017-07-28 MED ORDER — MELOXICAM 15 MG PO TABS: 15.0000 mg | ORAL_TABLET | Freq: Every day | ORAL | 0 refills | Status: DC

## 2017-07-28 NOTE — Discharge Instructions (Signed)
Please follow up with your primary care provider in about a week to check the strength of your finger without the splint.   Return to the ER for symptoms that change or worsen or for new concerns if unable to schedule an appointment.

## 2017-07-28 NOTE — ED Provider Notes (Signed)
[facility] Emergency Department Provider Note ____________________________________________  Time seen: Approximately 9:13 PM  I have reviewed the triage vital signs and the nursing notes.   HISTORY  Chief Complaint Finger Injury    HPI [name] is a 52 y.o. male who presents to the emergency department for evaluation and treatment of left hand injury after a mechanical, non-syncopal fall while [place] of the movie theater prior to arrival.  He states that his foot slipped and he reached back to catch himself and landed awkwardly on his hand.  His left middle finger is now deformed.  No alleviating measures were attempted prior to arrival.  Past Medical History:  Diagnosis Date  . Hypertension     Patient Active Problem List   Diagnosis Date Noted  . Hypertensive urgency 04/27/2017    Past Surgical History:  Procedure Laterality Date  . ANTERIOR CRUCIATE LIGAMENT REPAIR    . LAPAROTOMY      Prior to Admission medications   Medication Sig Start Date End Date Taking? Authorizing Provider  amitriptyline (ELAVIL) 25 MG tablet Take 25 mg by mouth at bedtime.    [provider]  meloxicam (MOBIC) 15 MG tablet Take 1 tablet (15 mg total) by mouth daily. 07/28/17   Vena Bassinger, Johnette Abraham B, FNP  traMADol (ULTRAM) 50 MG tablet Take 1 tablet (50 mg total) by mouth every 6 (six) hours as needed. 07/28/17   Victorino Dike, FNP    Allergies Patient has no known allergies.  Family History  Problem Relation Age of Onset  . Diabetes Mellitus II Mother   . Diabetes Mellitus II Father     Social History Social History   Tobacco Use  . Smoking status: Never Smoker  . Smokeless tobacco: Never Used  Substance Use Topics  . Alcohol use: No    Frequency: Never  . Drug use: No    Review of Systems Constitutional: Negative for fever. Cardiovascular: Negative for chest pain. Respiratory: Negative for shortness of breath. Musculoskeletal:  Positive for left hand pain Skin: Positive for abrasion to the left ring finger.  Neurological: Negative for decrease in sensation  ____________________________________________   PHYSICAL EXAM:  VITAL SIGNS: ED Triage Vitals  Enc Vitals Group     BP 07/28/17 2018 (!) 181/107     Pulse Rate 07/28/17 2018 80     Resp 07/28/17 2018 20     Temp 07/28/17 2018 98.2 F (36.8 C)     Temp Source 07/28/17 2018 Oral     SpO2 07/28/17 2018 99 %     Weight 07/28/17 2020 190 lb (86.2 kg)     Height 07/28/17 2020 6' (1.829 m)     Head Circumference --      Peak Flow --      Pain Score 07/28/17 2019 2     Pain Loc --      Pain Edu? --      Excl. in [place]? --     Constitutional: Alert and oriented. Well appearing and in no acute distress. Eyes: Conjunctivae are clear without discharge or drainage Head: Atraumatic Neck: Supple Respiratory: No cough. Respirations are even and unlabored. Musculoskeletal: Obvious deformity of the left middle finger. Neurologic: Sensory intact  Skin: Superficial abrasion to the PIP of the left ring finger.  Psychiatric: Affect and behavior are appropriate.  ____________________________________________   LABS (all labs ordered are listed, but only abnormal results are displayed)  Labs Reviewed - No data to display ____________________________________________  RADIOLOGY  Obvious dislocation of the PIP of the long finger on the left hand per radiology. I, Sherrie George, personally viewed and evaluated these images (plain radiographs) as part of my medical decision making, as well as reviewing the written report by the radiologist.   ____________________________________________   PROCEDURES  Reduction of dislocation Date/Time: 07/28/2017 9:41 PM Performed by: Victorino Dike, FNP Authorized by: Victorino Dike, FNP  Consent: Verbal consent obtained. Consent given by: patient Patient understanding: patient states understanding of the procedure being  performed Imaging studies: imaging studies available Local anesthesia used: yes Anesthesia: digital block  Anesthesia: Local anesthesia used: yes Local Anesthetic: lidocaine 1% without epinephrine   __________________________________________   INITIAL IMPRESSION / ASSESSMENT AND PLAN / ED COURSE  [name] is a 52 y.o. who presents to the emergency department for treatment and evaluation of left hand injury after a mechanical, non-syncopal fall. Obvious dislocation of the middle finger was reduced after digital block. X-ray confirmed successful reduction. A static finger splint was applied by the ER tech.  Patient instructed to follow-up with his PCP in about a week.  He was also instructed to return to the emergency department for symptoms that change or worsen if unable schedule an appointment with orthopedics or primary care.  Medications  lidocaine (PF) (XYLOCAINE) 1 % injection (has no administration in time range)  lidocaine (PF) (XYLOCAINE) 1 % injection 5 mL (has no administration in time range)    Pertinent labs & imaging results that were available during my care of the patient were reviewed by me and considered in my medical decision making (see chart for details).  _________________________________________   FINAL CLINICAL IMPRESSION(S) / ED DIAGNOSES  Final diagnoses:  Dislocation of finger, initial encounter    ED Discharge Orders        Ordered    traMADol (ULTRAM) 50 MG tablet  Every 6 hours PRN     07/28/17 2148    meloxicam (MOBIC) 15 MG tablet  Daily     07/28/17 2148       If controlled substance prescribed during this visit, 12 month history viewed on the [place] prior to issuing an initial prescription for Schedule II or III opiod.    Victorino Dike, FNP 07/28/17 2346    Orbie Pyo, MD 08/02/17 [phone]

## 2017-07-28 NOTE — ED Triage Notes (Signed)
Pt slipped, caught self on L hand. Pt has deformity to L 3 digit. Pt fell from standing.

## 2018-04-10 ENCOUNTER — Encounter: Payer: Self-pay | Admitting: *Deleted

## 2018-04-11 ENCOUNTER — Encounter: Payer: Self-pay | Admitting: Anesthesiology

## 2018-04-11 ENCOUNTER — Other Ambulatory Visit: Payer: Self-pay

## 2018-04-11 ENCOUNTER — Ambulatory Visit: Payer: BC Managed Care – PPO | Admitting: Anesthesiology

## 2018-04-11 ENCOUNTER — Encounter
Admission: RE | Disposition: A | Discharge status: Home / Self Care | Payer: Self-pay | Source: Home / Self Care | Attending: Internal Medicine

## 2018-04-11 ENCOUNTER — Ambulatory Visit
Admission: RE | Admit: 2018-04-11 | Discharge: 2018-04-11 | Disposition: A | Discharge status: Home / Self Care | Payer: BC Managed Care – PPO | Attending: Internal Medicine | Admitting: Internal Medicine

## 2018-04-11 DIAGNOSIS — K633 Ulcer of intestine: Secondary | ICD-10-CM | POA: Diagnosis not present

## 2018-04-11 DIAGNOSIS — Z7982 Long term (current) use of aspirin: Secondary | ICD-10-CM | POA: Diagnosis not present

## 2018-04-11 DIAGNOSIS — Z888 Allergy status to other drugs, medicaments and biological substances status: Secondary | ICD-10-CM | POA: Insufficient documentation

## 2018-04-11 DIAGNOSIS — E559 Vitamin D deficiency, unspecified: Secondary | ICD-10-CM | POA: Diagnosis not present

## 2018-04-11 DIAGNOSIS — I10 Essential (primary) hypertension: Secondary | ICD-10-CM | POA: Diagnosis not present

## 2018-04-11 DIAGNOSIS — Z791 Long term (current) use of non-steroidal anti-inflammatories (NSAID): Secondary | ICD-10-CM | POA: Diagnosis not present

## 2018-04-11 DIAGNOSIS — Z1211 Encounter for screening for malignant neoplasm of colon: Secondary | ICD-10-CM | POA: Diagnosis present

## 2018-04-11 DIAGNOSIS — D120 Benign neoplasm of cecum: Secondary | ICD-10-CM | POA: Diagnosis not present

## 2018-04-11 DIAGNOSIS — Z79899 Other long term (current) drug therapy: Secondary | ICD-10-CM | POA: Diagnosis not present

## 2018-04-11 DIAGNOSIS — I252 Old myocardial infarction: Secondary | ICD-10-CM | POA: Diagnosis not present

## 2018-04-11 DIAGNOSIS — K640 First degree hemorrhoids: Secondary | ICD-10-CM | POA: Insufficient documentation

## 2018-04-11 HISTORY — DX: Acute myocardial infarction, unspecified: I21.9

## 2018-04-11 HISTORY — DX: Cardiomegaly: I51.7

## 2018-04-11 HISTORY — PX: COLONOSCOPY WITH PROPOFOL: SHX5780

## 2018-04-11 HISTORY — DX: Vitamin D deficiency, unspecified: E55.9

## 2018-04-11 SURGERY — COLONOSCOPY WITH PROPOFOL
Anesthesia: General

## 2018-04-11 MED ORDER — PROPOFOL 500 MG/50ML IV EMUL
INTRAVENOUS | Status: AC
  Filled 2018-04-11: qty 50

## 2018-04-11 MED ORDER — LIDOCAINE HCL (CARDIAC) PF 100 MG/5ML IV SOSY
PREFILLED_SYRINGE | INTRAVENOUS | Status: DC | PRN
Start: 2018-04-11 — End: 2018-04-11
  Administered 2018-04-11: 50 mg via INTRAVENOUS

## 2018-04-11 MED ORDER — LIDOCAINE HCL (PF) 2 % IJ SOLN
INTRAMUSCULAR | Status: AC
  Filled 2018-04-11: qty 10

## 2018-04-11 MED ORDER — PROPOFOL 500 MG/50ML IV EMUL
INTRAVENOUS | Status: DC | PRN
  Administered 2018-04-11: 200 ug/kg/min via INTRAVENOUS

## 2018-04-11 MED ORDER — SODIUM CHLORIDE 0.9 % IV SOLN
INTRAVENOUS | Status: DC
  Administered 2018-04-11: 14:00:00 via INTRAVENOUS

## 2018-04-11 MED ORDER — PROPOFOL 10 MG/ML IV BOLUS
INTRAVENOUS | Status: DC | PRN
  Administered 2018-04-11: 60 mg via INTRAVENOUS

## 2018-04-11 MED ORDER — MIDAZOLAM HCL 5 MG/5ML IJ SOLN
INTRAMUSCULAR | Status: DC | PRN
  Administered 2018-04-11: 2 mg via INTRAVENOUS

## 2018-04-11 MED ORDER — MIDAZOLAM HCL 2 MG/2ML IJ SOLN
INTRAMUSCULAR | Status: AC
  Filled 2018-04-11: qty 2

## 2018-04-11 NOTE — Anesthesia Post-op Follow-up Note (Signed)
Anesthesia QCDR form completed.        

## 2018-04-11 NOTE — Anesthesia Preprocedure Evaluation (Signed)
Anesthesia Evaluation  Patient identified by MRN, date of birth, ID band Patient awake    Reviewed: Allergy & Precautions, NPO status , Patient's Chart, lab work & pertinent test results, reviewed documented beta blocker date and time   Airway Mallampati: II  TM Distance: >3 FB     Dental  (+) Chipped   Pulmonary           Cardiovascular hypertension, Pt. on medications and Pt. on home beta blockers + Past MI       Neuro/Psych    GI/Hepatic   Endo/Other    Renal/GU      Musculoskeletal   Abdominal   Peds  Hematology   Anesthesia Other Findings Hx of severe Hypertension. LVH with inv Ts.  Reproductive/Obstetrics                             Anesthesia Physical Anesthesia Plan  ASA: III  Anesthesia Plan: General   Post-op Pain Management:    Induction: Intravenous  PONV Risk Score and Plan:   Airway Management Planned:   Additional Equipment:   Intra-op Plan:   Post-operative Plan:   Informed Consent: I have reviewed the patients History and Physical, chart, labs and discussed the procedure including the risks, benefits and alternatives for the proposed anesthesia with the patient or authorized representative who has indicated his/her understanding and acceptance.     Plan Discussed with: CRNA  Anesthesia Plan Comments:         Anesthesia Quick Evaluation

## 2018-04-11 NOTE — Op Note (Signed)
[facility] Gastroenterology Patient Name: [name] Procedure Date: 04/11/2018 2:12 PM MRN: [id number] Account #: [id number] Date of Birth: 25-Dec-1965 Admit Type: Outpatient Age: 52 Room: [facility] ENDO ROOM 3 Gender: Male Note Status: Finalized Procedure:            Colonoscopy Indications:          Screening for colorectal malignant neoplasm Providers:            Benay Pike. Alice Reichert MD, MD Referring MD:         No Local Md, MD (Referring MD) Medicines:            Propofol per Anesthesia Complications:        No immediate complications. Procedure:            Pre-Anesthesia Assessment:                       - The risks and benefits of the procedure and the                        sedation options and risks were discussed with the                        patient. All questions were answered and informed                        consent was obtained.                       - Patient identification and proposed procedure were                        verified prior to the procedure by the nurse. The                        procedure was verified in the procedure room.                       - ASA Grade Assessment: II - A patient with mild                        systemic disease.                       - After reviewing the risks and benefits, the patient                        was deemed in satisfactory condition to undergo the                        procedure.                       After obtaining informed consent, the colonoscope was                        passed under direct vision. Throughout the procedure,                        the patient's blood pressure, pulse, and oxygen  saturations were monitored continuously. The                        Colonoscope was introduced through the anus and                        advanced to the the cecum, identified by appendiceal                        orifice and ileocecal valve. The colonoscopy was             performed without difficulty. The patient tolerated the                        procedure well. The quality of the bowel preparation                        was good. Findings:      The perianal and digital rectal examinations were normal. Pertinent       negatives include normal sphincter tone and no palpable rectal lesions.      A 5 mm polyp was found in the cecum. The polyp was sessile. The polyp       was removed with a jumbo cold forceps. Resection and retrieval were       complete.      Non-bleeding internal hemorrhoids were found during retroflexion. The       hemorrhoids were Grade I (internal hemorrhoids that do not prolapse).      The exam was otherwise without abnormality. Impression:           - One 5 mm polyp in the cecum, removed with a jumbo                        cold forceps. Resected and retrieved.                       - Non-bleeding internal hemorrhoids.                       - The examination was otherwise normal. Recommendation:       - Patient has a contact number available for                        emergencies. The signs and symptoms of potential                        delayed complications were discussed with the patient.                        Return to normal activities tomorrow. Written discharge                        instructions were provided to the patient.                       - Resume previous diet.                       - Repeat colonoscopy is recommended for surveillance.  The colonoscopy date will be determined after pathology                        results from today's exam become available for review.                       - Return to GI office PRN.                       - The findings and recommendations were discussed with                        the patient and their family. Procedure Code(s):    --- Professional ---                       [phone], Colonoscopy, flexible; with biopsy, single or                         multiple Diagnosis Code(s):    --- Professional ---                       K64.0, First degree hemorrhoids                       D12.0, Benign neoplasm of cecum                       Z12.11, Encounter for screening for malignant neoplasm                        of colon CPT copyright 2018 American [employer]. All rights reserved. The codes documented in this report are preliminary and upon coder review may  be revised to meet current compliance requirements. Efrain Sella MD, MD 04/11/2018 2:42:45 PM This report has been signed electronically. Number of Addenda: 0 Note Initiated On: 04/11/2018 2:12 PM Scope Withdrawal Time: 0 hours 6 minutes 16 seconds  Total Procedure Duration: 0 hours 9 minutes 49 seconds       [facility]

## 2018-04-11 NOTE — H&P (Signed)
Outpatient short stay form Pre-procedure 04/11/2018 1:39 PM Teodoro K. Alice Reichert, M.D.  Primary Physician: Rudie Meyer, M.D.  Reason for visit:  Colon cancer screening.   History of present illness: Patient presents for colonoscopy for colon cancer screening. The patient denies complaints of abdominal pain, significant change in bowel habits, or rectal bleeding.    Current Facility-Administered Medications:  .  0.9 %  sodium chloride infusion, , Intravenous, Continuous, [place], Benay Pike, MD  Medications Prior to Admission  Medication Sig Dispense Refill Last Dose  . amLODipine (NORVASC) 10 MG tablet Take 10 mg by mouth daily.   04/11/2018 at 0600  . aspirin 81 MG tablet Take 81 mg by mouth daily.     . carvedilol (COREG) 25 MG tablet Take 25 mg by mouth 2 (two) times daily with a meal.   04/11/2018 at [id number] time  . chlorthalidone (HYGROTON) 25 MG tablet Take 25 mg by mouth daily.   04/10/2018 at 0600  . Cholecalciferol 25 MCG (1000 UT) capsule Take 1,000 Units by mouth daily.   04/10/2018 at Unknown time  . hydrALAZINE (APRESOLINE) 10 MG tablet Take 10 mg by mouth 3 (three) times daily.   04/11/2018 at 0600  . amitriptyline (ELAVIL) 25 MG tablet Take 25 mg by mouth at bedtime.   Not Taking at Unknown time  . meloxicam (MOBIC) 15 MG tablet Take 1 tablet (15 mg total) by mouth daily. (Patient not taking: Reported on 04/11/2018) 30 tablet 0 Not Taking at Unknown time  . traMADol (ULTRAM) 50 MG tablet Take 1 tablet (50 mg total) by mouth every 6 (six) hours as needed. (Patient not taking: Reported on 04/11/2018) 12 tablet 0 Not Taking at Unknown time     Allergies  Allergen Reactions  . Elavil [Amitriptyline Hcl]      Past Medical History:  Diagnosis Date  . AKI (acute kidney injury) ([place])   . Hypertension   . LVH (left ventricular hypertrophy)   . Myocardial infarction ([place])   . Vitamin D deficiency     Review of systems:  Otherwise negative.    Physical  Exam  Gen: Alert, oriented. Appears stated age.  HEENT: [place]/AT. PERRLA. Lungs: CTA, no wheezes. CV: RR nl S1, S2. Abd: soft, benign, no masses. BS+ Ext: No edema. Pulses 2+    Planned procedures: Proceed with colonoscopy. The patient understands the nature of the planned procedure, indications, risks, alternatives and potential complications including but not limited to bleeding, infection, perforation, damage to internal organs and possible oversedation/side effects from anesthesia. The patient agrees and gives consent to proceed.  Please refer to procedure notes for findings, recommendations and patient disposition/instructions.     Teodoro K. Alice Reichert, M.D. Gastroenterology 04/11/2018  1:39 PM

## 2018-04-11 NOTE — Transfer of Care (Signed)
Immediate Anesthesia Transfer of Care Note  Patient: [name]  Procedure(s) Performed: COLONOSCOPY WITH PROPOFOL (N/A )  Patient Location: PACU  Anesthesia Type:General  Level of Consciousness: drowsy  Airway & Oxygen Therapy: Patient Spontanous Breathing and Patient connected to nasal cannula oxygen  Post-op Assessment: Report given to RN and Post -op Vital signs reviewed and stable  Post vital signs: Reviewed and stable  Last Vitals:  Vitals Value Taken Time  BP 101/64 04/11/2018  2:43 PM  Temp 36.6 C 04/11/2018  2:42 PM  Pulse 73 04/11/2018  2:43 PM  Resp 15 04/11/2018  2:43 PM  SpO2 100 % 04/11/2018  2:43 PM  Vitals shown include unvalidated device data.  Last Pain:  Vitals:   04/11/18 1442  TempSrc: Tympanic  PainSc:          Complications: No apparent anesthesia complications

## 2018-04-11 NOTE — Interval H&P Note (Signed)
History and Physical Interval Note:  04/11/2018 1:40 PM  [name]  has presented today for surgery, with the diagnosis of Colon cancer screening  The various methods of treatment have been discussed with the patient and family. After consideration of risks, benefits and other options for treatment, the patient has consented to  Procedure(s): COLONOSCOPY WITH PROPOFOL (N/A) as a surgical intervention .  The patient's history has been reviewed, patient examined, no change in status, stable for surgery.  I have reviewed the patient's chart and labs.  Questions were answered to the patient's satisfaction.     [place], [place]

## 2018-04-11 NOTE — Anesthesia Postprocedure Evaluation (Signed)
Anesthesia Post Note  Patient: [name]  Procedure(s) Performed: COLONOSCOPY WITH PROPOFOL (N/A )  Patient location during evaluation: Endoscopy Anesthesia Type: General Level of consciousness: awake and alert Pain management: pain level controlled Vital Signs Assessment: post-procedure vital signs reviewed and stable Respiratory status: spontaneous breathing, nonlabored ventilation, respiratory function stable and patient connected to nasal cannula oxygen Cardiovascular status: blood pressure returned to baseline and stable Postop Assessment: no apparent nausea or vomiting Anesthetic complications: no     Last Vitals:  Vitals:   04/11/18 1404 04/11/18 1442  BP: (!) 155/92 101/64  Pulse:    Resp:  15  Temp:  36.6 C  SpO2:      Last Pain:  Vitals:   04/11/18 1442  TempSrc: Tympanic  PainSc:                  [name] S

## 2018-04-13 LAB — SURGICAL PATHOLOGY

## 2018-07-29 IMAGING — DX DG FINGER MIDDLE 2+V*L*
3 series · 3 of 3 positions shown · non-contrast
Comparison: None.

CLINICAL DATA: Fall from standing height, catching with the left
hand. Deformity of the left middle finger.

EXAM:
LEFT MIDDLE FINGER 2+V

[finger ap]
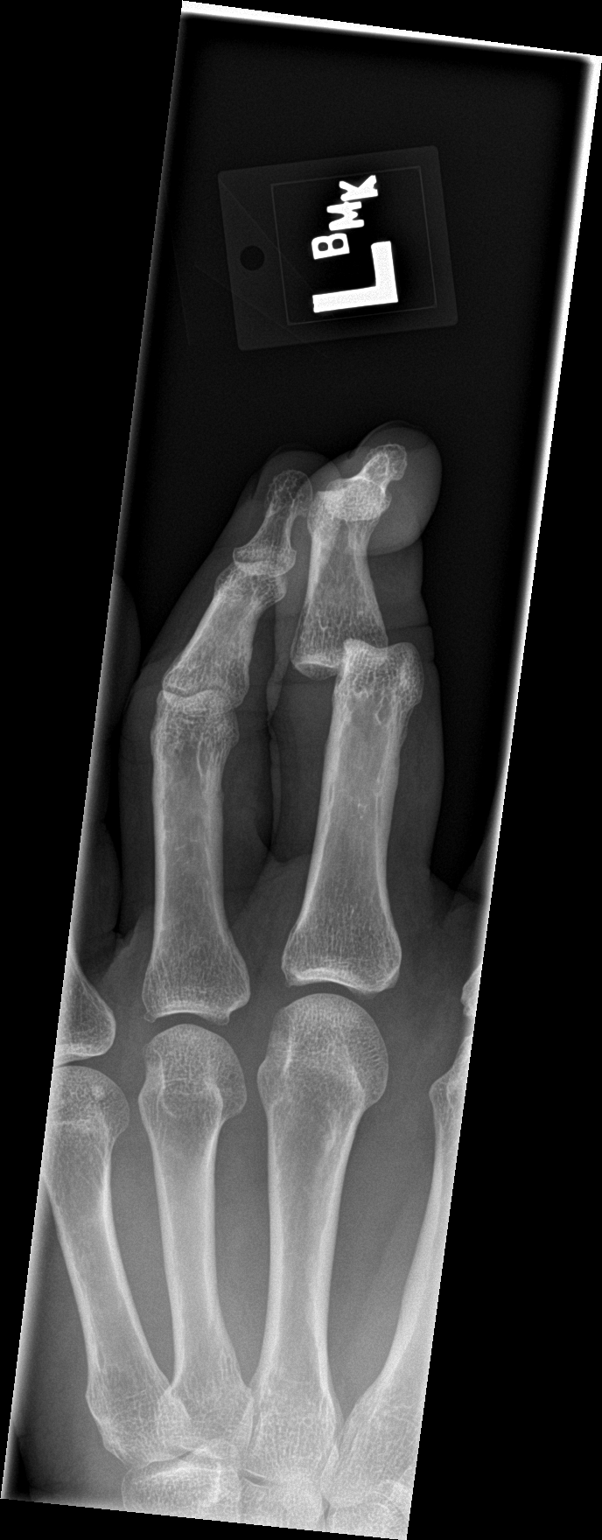

[finger obl]
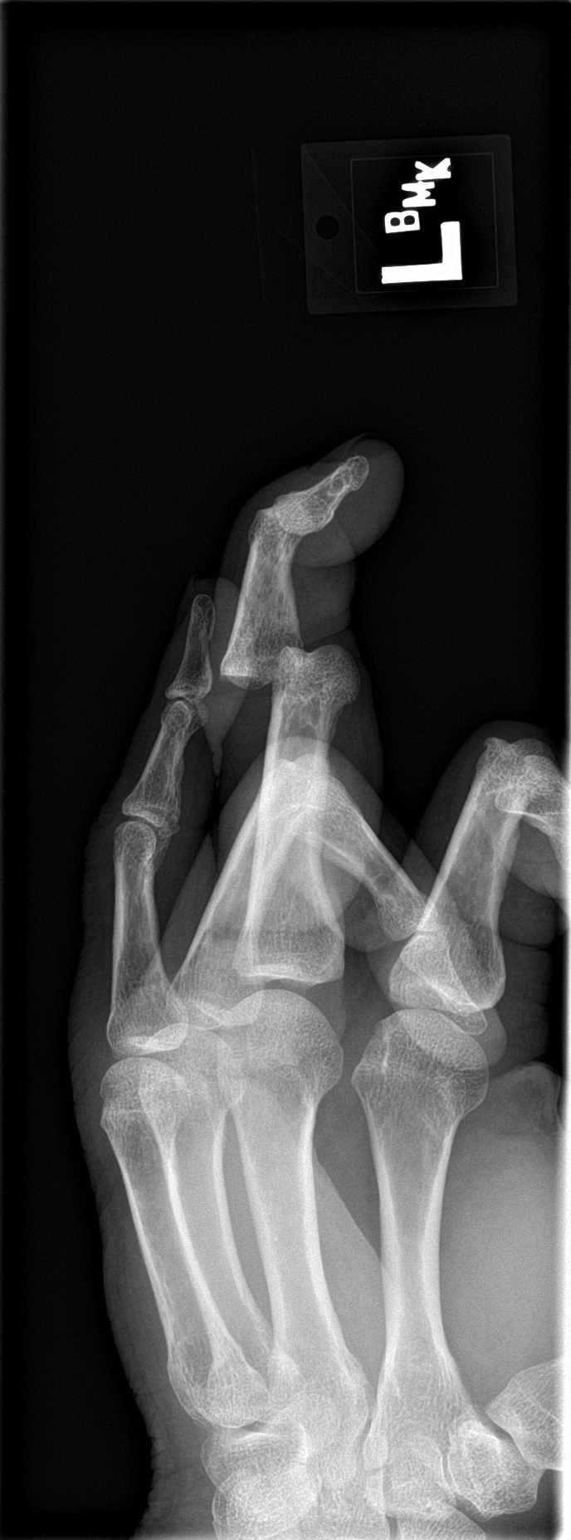

[finger lat]
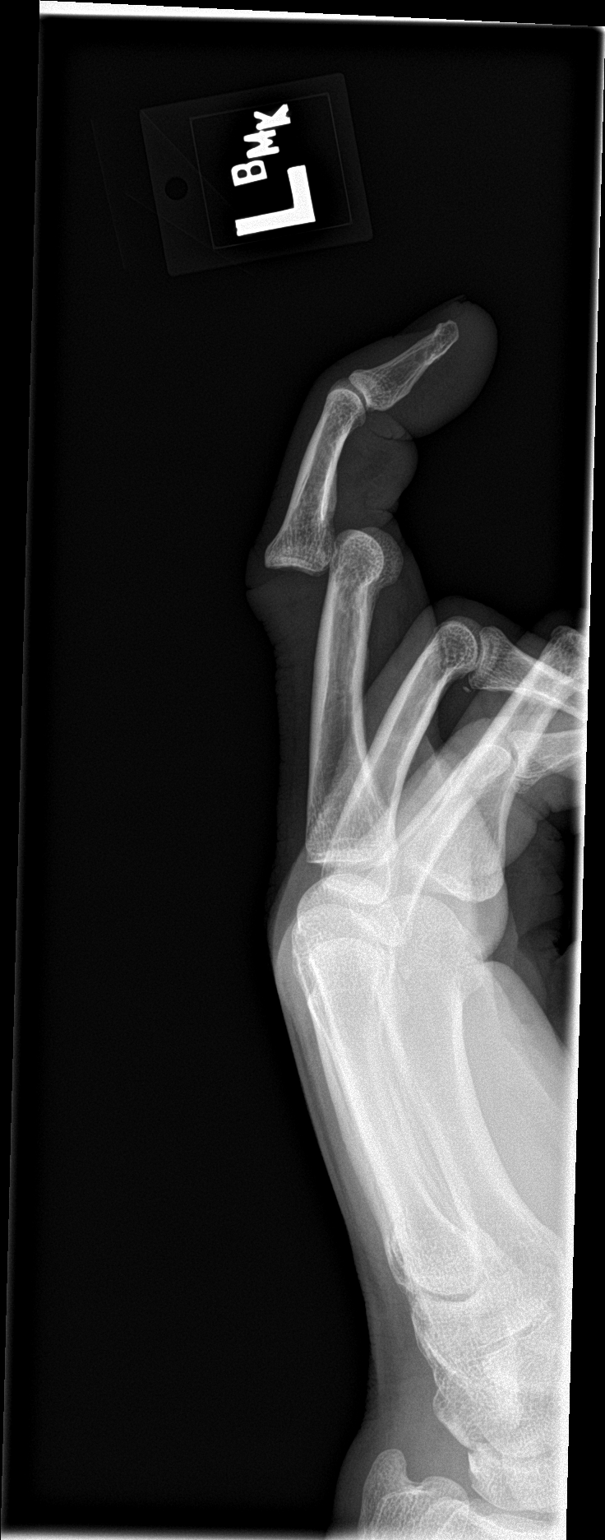

[3 of 3 positions shown; findings below may reference images not displayed]

FINDINGS: The middle phalanx of the middle finger is dislocated posteriorly
with respect to the proximal phalanx. 9 mm bony overlap. The middle
phalanx is also displaced about 7 mm medially.

No well-defined fracture.
IMPRESSION: 1. Dislocated proximal interphalangeal joint, with 1 bone width
posterior displacement of the middle phalanx with respect to
proximal, and about 7 mm medial displacement. There is 9 mm of bony
overlap. No well-defined fracture.

## 2018-07-29 IMAGING — DX DG FINGER MIDDLE 2+V*L*
3 series · 3 of 3 positions shown · non-contrast
Comparison: 07/28/2017

CLINICAL DATA: Postreduction

EXAM:
LEFT MIDDLE FINGER 2+V

[finger ap]
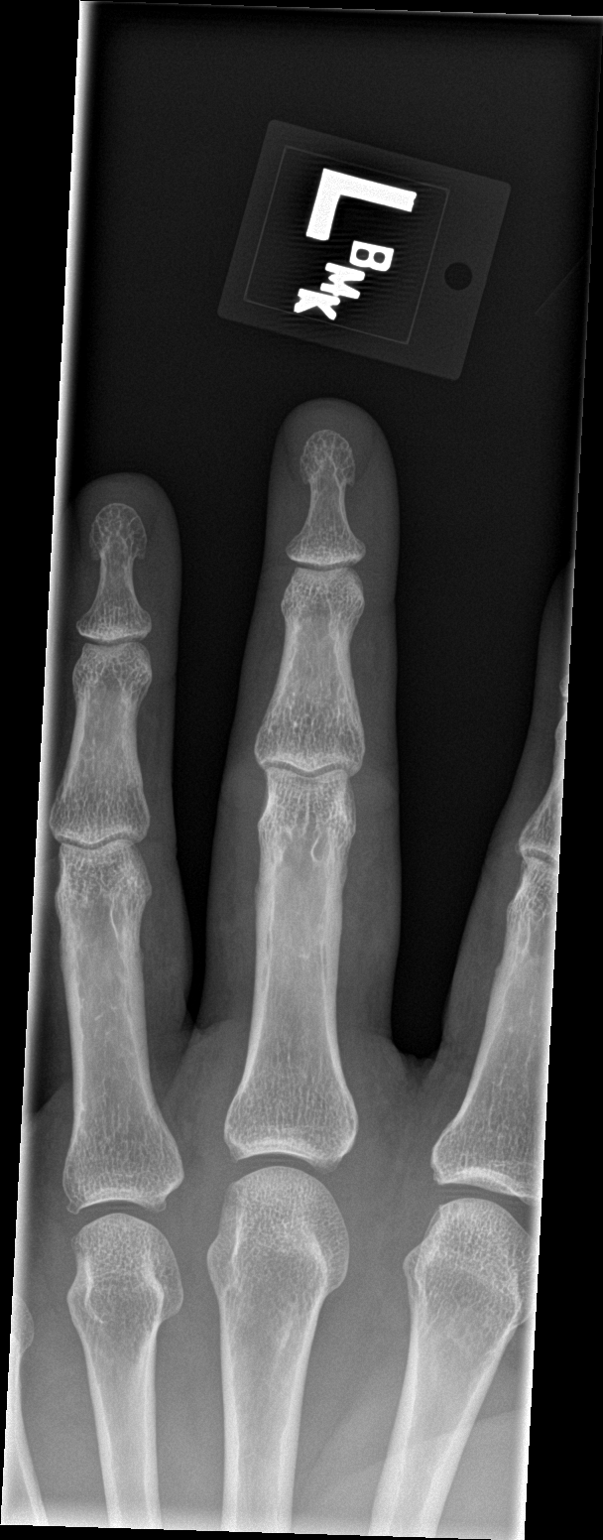

[finger obl]
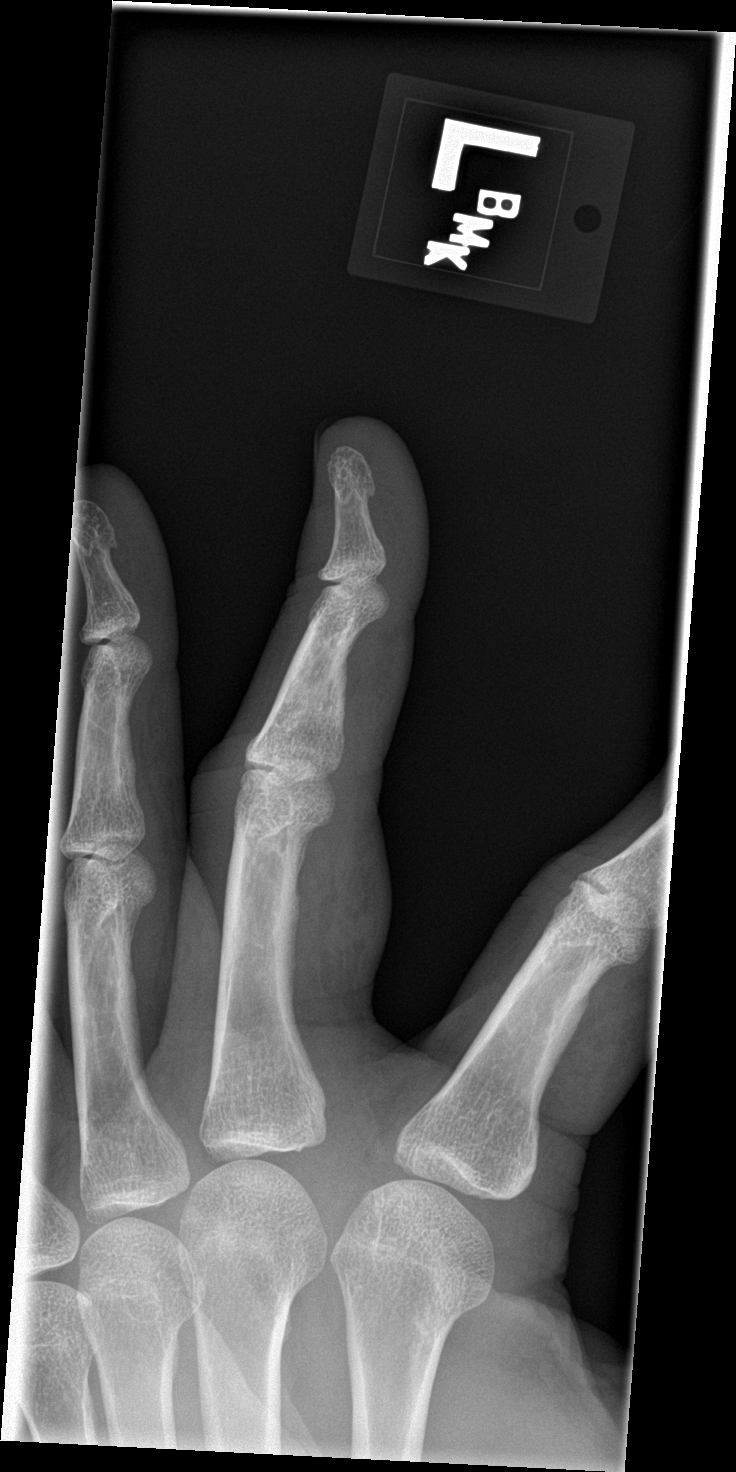

[finger lat]
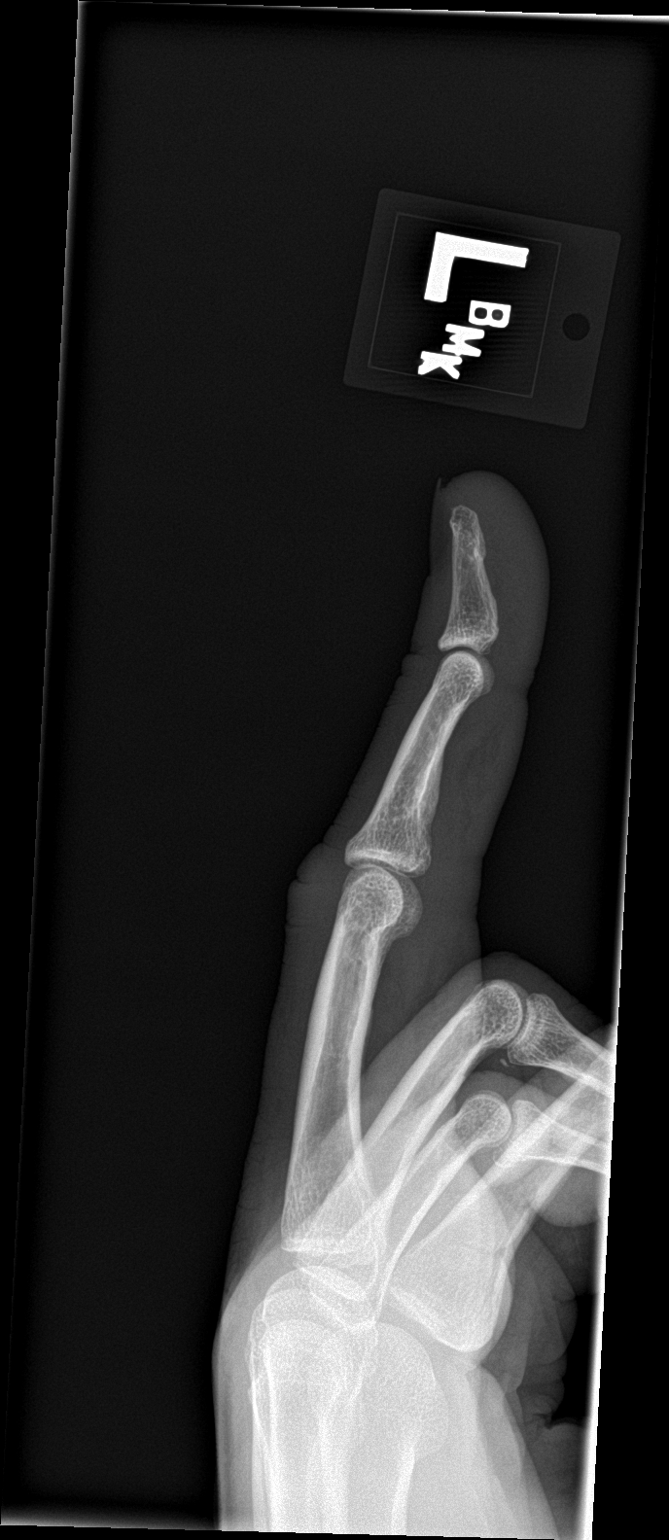

[3 of 3 positions shown; findings below may reference images not displayed]

FINDINGS: Normal anatomic position of the proximal interphalangeal joint of
the left third finger postreduction. No fractures identified.
Diffuse soft tissue swelling.
IMPRESSION: Relocation of the proximal interphalangeal joint of the left third
finger after reduction. Soft tissue swelling.

## 2022-08-14 ENCOUNTER — Inpatient Hospital Stay (HOSPITAL_COMMUNITY)
Admission: AD | Admit: 2022-08-14 | Discharge: 2022-08-18 | DRG: 040 | Disposition: A | Discharge status: Rehabilitation Facility | Payer: BC Managed Care – PPO | Source: Other Acute Inpatient Hospital | Attending: Student in an Organized Health Care Education/Training Program | Admitting: Student in an Organized Health Care Education/Training Program

## 2022-08-14 ENCOUNTER — Emergency Department: Payer: BC Managed Care – PPO

## 2022-08-14 ENCOUNTER — Inpatient Hospital Stay (HOSPITAL_COMMUNITY): Payer: BC Managed Care – PPO | Admitting: Registered Nurse

## 2022-08-14 ENCOUNTER — Encounter (HOSPITAL_COMMUNITY)
Admission: AD | Disposition: A | Discharge status: Rehabilitation Facility | Payer: Self-pay | Source: Other Acute Inpatient Hospital | Attending: Neurology

## 2022-08-14 ENCOUNTER — Emergency Department
Admission: EM | Admit: 2022-08-14 | Discharge: 2022-08-14 | Disposition: A | Discharge status: Other Acute Inpatient Hospital | Payer: BC Managed Care – PPO | Attending: Emergency Medicine | Admitting: Emergency Medicine

## 2022-08-14 ENCOUNTER — Inpatient Hospital Stay
Admit: 2022-08-14 | Payer: BC Managed Care – PPO | Admitting: Student in an Organized Health Care Education/Training Program

## 2022-08-14 ENCOUNTER — Encounter (HOSPITAL_COMMUNITY): Payer: Self-pay

## 2022-08-14 DIAGNOSIS — I9581 Postprocedural hypotension: Secondary | ICD-10-CM | POA: Diagnosis not present

## 2022-08-14 DIAGNOSIS — E669 Obesity, unspecified: Secondary | ICD-10-CM | POA: Diagnosis present

## 2022-08-14 DIAGNOSIS — Z888 Allergy status to other drugs, medicaments and biological substances status: Secondary | ICD-10-CM

## 2022-08-14 DIAGNOSIS — E1165 Type 2 diabetes mellitus with hyperglycemia: Secondary | ICD-10-CM | POA: Diagnosis present

## 2022-08-14 DIAGNOSIS — R531 Weakness: Secondary | ICD-10-CM | POA: Diagnosis present

## 2022-08-14 DIAGNOSIS — D509 Iron deficiency anemia, unspecified: Secondary | ICD-10-CM | POA: Diagnosis present

## 2022-08-14 DIAGNOSIS — T45615A Adverse effect of thrombolytic drugs, initial encounter: Secondary | ICD-10-CM | POA: Diagnosis present

## 2022-08-14 DIAGNOSIS — Z9282 Status post administration of tPA (rtPA) in a different facility within the last 24 hours prior to admission to current facility: Secondary | ICD-10-CM

## 2022-08-14 DIAGNOSIS — G8191 Hemiplegia, unspecified affecting right dominant side: Secondary | ICD-10-CM | POA: Diagnosis present

## 2022-08-14 DIAGNOSIS — J95821 Acute postprocedural respiratory failure: Secondary | ICD-10-CM | POA: Diagnosis not present

## 2022-08-14 DIAGNOSIS — E785 Hyperlipidemia, unspecified: Secondary | ICD-10-CM | POA: Diagnosis present

## 2022-08-14 DIAGNOSIS — I2510 Atherosclerotic heart disease of native coronary artery without angina pectoris: Secondary | ICD-10-CM | POA: Diagnosis present

## 2022-08-14 DIAGNOSIS — I252 Old myocardial infarction: Secondary | ICD-10-CM | POA: Diagnosis not present

## 2022-08-14 DIAGNOSIS — I63412 Cerebral infarction due to embolism of left middle cerebral artery: Secondary | ICD-10-CM | POA: Diagnosis present

## 2022-08-14 DIAGNOSIS — N184 Chronic kidney disease, stage 4 (severe): Secondary | ICD-10-CM | POA: Diagnosis present

## 2022-08-14 DIAGNOSIS — G9349 Other encephalopathy: Secondary | ICD-10-CM | POA: Diagnosis present

## 2022-08-14 DIAGNOSIS — Z6830 Body mass index (BMI) 30.0-30.9, adult: Secondary | ICD-10-CM

## 2022-08-14 DIAGNOSIS — I639 Cerebral infarction, unspecified: Secondary | ICD-10-CM

## 2022-08-14 DIAGNOSIS — D689 Coagulation defect, unspecified: Secondary | ICD-10-CM | POA: Diagnosis present

## 2022-08-14 DIAGNOSIS — Z7982 Long term (current) use of aspirin: Secondary | ICD-10-CM

## 2022-08-14 DIAGNOSIS — I10 Essential (primary) hypertension: Secondary | ICD-10-CM | POA: Diagnosis present

## 2022-08-14 DIAGNOSIS — Z5309 Procedure and treatment not carried out because of other contraindication: Secondary | ICD-10-CM | POA: Diagnosis not present

## 2022-08-14 DIAGNOSIS — I129 Hypertensive chronic kidney disease with stage 1 through stage 4 chronic kidney disease, or unspecified chronic kidney disease: Secondary | ICD-10-CM | POA: Diagnosis present

## 2022-08-14 DIAGNOSIS — N179 Acute kidney failure, unspecified: Secondary | ICD-10-CM | POA: Diagnosis present

## 2022-08-14 DIAGNOSIS — Q2112 Patent foramen ovale: Secondary | ICD-10-CM

## 2022-08-14 DIAGNOSIS — M109 Gout, unspecified: Secondary | ICD-10-CM | POA: Diagnosis present

## 2022-08-14 DIAGNOSIS — Z7682 Awaiting organ transplant status: Secondary | ICD-10-CM

## 2022-08-14 DIAGNOSIS — Y900 Blood alcohol level of less than 20 mg/100 ml: Secondary | ICD-10-CM | POA: Diagnosis not present

## 2022-08-14 DIAGNOSIS — I63511 Cerebral infarction due to unspecified occlusion or stenosis of right middle cerebral artery: Secondary | ICD-10-CM | POA: Diagnosis not present

## 2022-08-14 DIAGNOSIS — E1122 Type 2 diabetes mellitus with diabetic chronic kidney disease: Secondary | ICD-10-CM | POA: Diagnosis present

## 2022-08-14 DIAGNOSIS — R29710 NIHSS score 10: Secondary | ICD-10-CM | POA: Diagnosis present

## 2022-08-14 DIAGNOSIS — Z79899 Other long term (current) drug therapy: Secondary | ICD-10-CM

## 2022-08-14 DIAGNOSIS — J9600 Acute respiratory failure, unspecified whether with hypoxia or hypercapnia: Secondary | ICD-10-CM | POA: Diagnosis not present

## 2022-08-14 DIAGNOSIS — I63512 Cerebral infarction due to unspecified occlusion or stenosis of left middle cerebral artery: Secondary | ICD-10-CM | POA: Diagnosis not present

## 2022-08-14 DIAGNOSIS — T783XXA Angioneurotic edema, initial encounter: Secondary | ICD-10-CM | POA: Diagnosis not present

## 2022-08-14 DIAGNOSIS — R4701 Aphasia: Secondary | ICD-10-CM | POA: Diagnosis present

## 2022-08-14 DIAGNOSIS — R5381 Other malaise: Secondary | ICD-10-CM | POA: Diagnosis not present

## 2022-08-14 DIAGNOSIS — G4733 Obstructive sleep apnea (adult) (pediatric): Secondary | ICD-10-CM | POA: Diagnosis present

## 2022-08-14 DIAGNOSIS — E119 Type 2 diabetes mellitus without complications: Secondary | ICD-10-CM

## 2022-08-14 DIAGNOSIS — R451 Restlessness and agitation: Secondary | ICD-10-CM | POA: Diagnosis present

## 2022-08-14 HISTORY — DX: Essential (primary) hypertension: I10

## 2022-08-14 HISTORY — PX: RADIOLOGY WITH ANESTHESIA: SHX6223

## 2022-08-14 LAB — COMPREHENSIVE METABOLIC PANEL
ALT: 19 U/L (ref 0–44)
AST: 30 U/L (ref 15–41)
Alkaline Phosphatase: 64 U/L (ref 38–126)
BUN: 44 mg/dL — ABNORMAL HIGH (ref 6–20)
CO2: 21 mmol/L — ABNORMAL LOW (ref 22–32)
Chloride: 108 mmol/L (ref 98–111)
Creatinine, Ser: 3.9 mg/dL — ABNORMAL HIGH (ref 0.61–1.24)
Glucose, Bld: 111 mg/dL — ABNORMAL HIGH (ref 70–99)
Potassium: 4.2 mmol/L (ref 3.5–5.1)
Total Bilirubin: 0.8 mg/dL (ref 0.3–1.2)
Total Protein: 7.7 g/dL (ref 6.5–8.1)

## 2022-08-14 LAB — APTT: aPTT: 34 seconds (ref 24–36)

## 2022-08-14 LAB — PROTIME-INR
INR: 1.1 (ref 0.8–1.2)
Prothrombin Time: 14.4 seconds (ref 11.4–15.2)

## 2022-08-14 LAB — DIFFERENTIAL
Basophils Absolute: 0.1 10*3/uL (ref 0.0–0.1)
Basophils Relative: 1 %
Eosinophils Absolute: 0.3 10*3/uL (ref 0.0–0.5)
Eosinophils Relative: 3 %
Immature Granulocytes: 1 %
Lymphocytes Relative: 21 %
Lymphs Abs: 2 10*3/uL (ref 0.7–4.0)
Monocytes Absolute: 0.8 10*3/uL (ref 0.1–1.0)
Neutro Abs: 6.4 10*3/uL (ref 1.7–7.7)
Neutrophils Relative %: 66 %

## 2022-08-14 LAB — CBC
HCT: 38.2 % — ABNORMAL LOW (ref 39.0–52.0)
Hemoglobin: 12.1 g/dL — ABNORMAL LOW (ref 13.0–17.0)
MCHC: 31.7 g/dL (ref 30.0–36.0)
RDW: 14.3 % (ref 11.5–15.5)
WBC: 9.5 10*3/uL (ref 4.0–10.5)
nRBC: 0 % (ref 0.0–0.2)

## 2022-08-14 LAB — ETHANOL: Alcohol, Ethyl (B): <10 mg/dL (ref <10)

## 2022-08-14 LAB — CBG MONITORING, ED: Glucose-Capillary: 99 mg/dL (ref 70–99)

## 2022-08-14 SURGERY — IR WITH ANESTHESIA
Anesthesia: General

## 2022-08-14 MED ORDER — ACETAMINOPHEN 325 MG PO TABS: 650.0000 mg | ORAL_TABLET | ORAL | Status: DC | PRN

## 2022-08-14 MED ORDER — ALTEPLASE 100 MG IV SOLR
INTRAVENOUS | Status: AC
  Filled 2022-08-14: qty 100

## 2022-08-14 MED ORDER — PANTOPRAZOLE SODIUM 40 MG IV SOLR
40.0000 mg | Freq: Every day | INTRAVENOUS | Status: DC
Start: 2022-08-15 — End: 2022-08-15

## 2022-08-14 MED ORDER — LACTATED RINGERS IV SOLN: INTRAVENOUS | Status: DC | PRN

## 2022-08-14 MED ORDER — IOHEXOL 350 MG/ML SOLN
100.0000 mL | Freq: Once | INTRAVENOUS | Status: AC | PRN
  Administered 2022-08-14: 100 mL via INTRAVENOUS

## 2022-08-14 MED ORDER — ACETAMINOPHEN 650 MG RE SUPP: 650.0000 mg | RECTAL | Status: DC | PRN

## 2022-08-14 MED ORDER — TENECTEPLASE FOR STROKE
PACK | INTRAVENOUS | Status: AC
  Administered 2022-08-14: 24.5 mg via INTRAVENOUS
  Filled 2022-08-14: qty 10

## 2022-08-14 MED ORDER — ACETAMINOPHEN 160 MG/5ML PO SOLN: 650.0000 mg | ORAL | Status: DC | PRN

## 2022-08-14 MED ORDER — TENECTEPLASE FOR STROKE: 0.2500 mg/kg | PACK | Freq: Once | INTRAVENOUS | Status: AC

## 2022-08-14 MED ORDER — SENNOSIDES-DOCUSATE SODIUM 8.6-50 MG PO TABS: 1.0000 | ORAL_TABLET | Freq: Every evening | ORAL | Status: DC | PRN

## 2022-08-14 MED ORDER — FENTANYL CITRATE (PF) 100 MCG/2ML IJ SOLN
INTRAMUSCULAR | Status: AC
  Filled 2022-08-14: qty 2

## 2022-08-14 MED ORDER — LABETALOL HCL 5 MG/ML IV SOLN
INTRAVENOUS | Status: AC
  Filled 2022-08-14: qty 4

## 2022-08-14 MED ORDER — STROKE: EARLY STAGES OF RECOVERY BOOK
Freq: Once | Status: AC
  Filled 2022-08-14 (×2): qty 1

## 2022-08-14 MED ORDER — SODIUM CHLORIDE 0.9 % IV SOLN: INTRAVENOUS | Status: DC

## 2022-08-14 NOTE — ED Notes (Addendum)
Pt opened mouth and blood was noted, Pt wife reported new small swollen spot to face, ERP notified and examine. [facility] at bedside. No new orders.

## 2022-08-15 ENCOUNTER — Inpatient Hospital Stay (HOSPITAL_COMMUNITY): Payer: BC Managed Care – PPO

## 2022-08-15 ENCOUNTER — Encounter (HOSPITAL_COMMUNITY): Payer: Self-pay | Admitting: Interventional Radiology

## 2022-08-15 DIAGNOSIS — I63511 Cerebral infarction due to unspecified occlusion or stenosis of right middle cerebral artery: Secondary | ICD-10-CM

## 2022-08-15 HISTORY — PX: IR ANGIO INTRA EXTRACRAN SEL INTERNAL CAROTID UNI L MOD SED: IMG5361

## 2022-08-15 HISTORY — PX: IR US GUIDE VASC ACCESS RIGHT: IMG2390

## 2022-08-15 LAB — BASIC METABOLIC PANEL
BUN: 43 mg/dL — ABNORMAL HIGH (ref 6–20)
CO2: 18 mmol/L — ABNORMAL LOW (ref 22–32)
Calcium: 8.8 mg/dL — ABNORMAL LOW (ref 8.9–10.3)
Chloride: 109 mmol/L (ref 98–111)
Creatinine, Ser: 3.65 mg/dL — ABNORMAL HIGH (ref 0.61–1.24)

## 2022-08-15 LAB — ECHOCARDIOGRAM COMPLETE
AV Peak grad: 3.1 mmHg
Ao pk vel: 0.88 m/s
Area-P 1/2: 3.77 cm2
Height: 72 in
Weight: 3456.81 oz

## 2022-08-15 LAB — LIPID PANEL
Cholesterol: 172 mg/dL (ref 0–200)
HDL: 36 mg/dL — ABNORMAL LOW (ref >40)
LDL Cholesterol: 96 mg/dL (ref 0–99)
Total CHOL/HDL Ratio: 4.8 RATIO
Triglycerides: 199 mg/dL — ABNORMAL HIGH (ref <150)

## 2022-08-15 LAB — HIV ANTIBODY (ROUTINE TESTING W REFLEX): HIV Screen 4th Generation wRfx: NONREACTIVE

## 2022-08-15 LAB — GLUCOSE, CAPILLARY: Glucose-Capillary: 124 mg/dL — ABNORMAL HIGH (ref 70–99)

## 2022-08-15 LAB — HEMOGLOBIN A1C: Hgb A1c MFr Bld: 6.3 % — ABNORMAL HIGH (ref 4.8–5.6)

## 2022-08-15 LAB — MRSA NEXT GEN BY PCR, NASAL: MRSA by PCR Next Gen: NOT DETECTED

## 2022-08-15 MED ORDER — PROPOFOL 500 MG/50ML IV EMUL
INTRAVENOUS | Status: DC | PRN
  Administered 2022-08-15: 50 ug/kg/min via INTRAVENOUS

## 2022-08-15 MED ORDER — DIPHENHYDRAMINE HCL 50 MG/ML IJ SOLN
12.5000 mg | Freq: Four times a day (QID) | INTRAMUSCULAR | Status: DC
  Administered 2022-08-15 – 2022-08-16 (×7): 12.5 mg via INTRAVENOUS
  Filled 2022-08-15 (×8): qty 1

## 2022-08-15 MED ORDER — FENTANYL CITRATE (PF) 100 MCG/2ML IJ SOLN
INTRAMUSCULAR | Status: DC | PRN
  Administered 2022-08-15: 100 ug via INTRAVENOUS

## 2022-08-15 MED ORDER — ACETAMINOPHEN 160 MG/5ML PO SOLN
650.0000 mg | ORAL | Status: DC | PRN
  Administered 2022-08-16: 650 mg
  Filled 2022-08-15: qty 20.3

## 2022-08-15 MED ORDER — DEXAMETHASONE SODIUM PHOSPHATE 4 MG/ML IJ SOLN
4.0000 mg | Freq: Four times a day (QID) | INTRAMUSCULAR | Status: AC
  Administered 2022-08-15 – 2022-08-16 (×8): 4 mg via INTRAVENOUS
  Filled 2022-08-15 (×8): qty 1

## 2022-08-15 MED ORDER — ORAL CARE MOUTH RINSE
15.0000 mL | OROMUCOSAL | Status: DC
  Administered 2022-08-15 – 2022-08-16 (×14): 15 mL via OROMUCOSAL

## 2022-08-15 MED ORDER — CLOPIDOGREL BISULFATE 75 MG PO TABS: 300.0000 mg | ORAL_TABLET | Freq: Once | ORAL | Status: DC

## 2022-08-15 MED ORDER — INSULIN ASPART 100 UNIT/ML IJ SOLN
0.0000 [IU] | INTRAMUSCULAR | Status: DC
  Administered 2022-08-16 (×4): 1 [IU] via SUBCUTANEOUS

## 2022-08-15 MED ORDER — LACTATED RINGERS IV SOLN: INTRAVENOUS | Status: DC | PRN

## 2022-08-15 MED ORDER — CHLORHEXIDINE GLUCONATE CLOTH 2 % EX PADS
6.0000 | MEDICATED_PAD | Freq: Every day | CUTANEOUS | Status: DC
  Administered 2022-08-15 – 2022-08-16 (×2): 6 via TOPICAL

## 2022-08-15 MED ORDER — PROPOFOL 1000 MG/100ML IV EMUL
INTRAVENOUS | Status: AC
  Administered 2022-08-15: 50 ug
  Filled 2022-08-15: qty 100

## 2022-08-15 MED ORDER — PROPOFOL 10 MG/ML IV BOLUS
INTRAVENOUS | Status: DC | PRN
  Administered 2022-08-15: 50 mg via INTRAVENOUS

## 2022-08-15 MED ORDER — DIPHENHYDRAMINE HCL 50 MG/ML IJ SOLN
INTRAMUSCULAR | Status: DC | PRN
  Administered 2022-08-15: 12.5 mg via INTRAVENOUS

## 2022-08-15 MED ORDER — DIPHENHYDRAMINE HCL 50 MG/ML IJ SOLN
50.0000 mg | INTRAMUSCULAR | Status: AC
  Administered 2022-08-15: 50 mg via INTRAVENOUS
  Filled 2022-08-15: qty 1

## 2022-08-15 MED ORDER — LIDOCAINE 2% (20 MG/ML) 5 ML SYRINGE
INTRAMUSCULAR | Status: DC | PRN
  Administered 2022-08-15: 60 mg via INTRAVENOUS

## 2022-08-15 MED ORDER — HEPARIN SODIUM (PORCINE) 5000 UNIT/ML IJ SOLN
5000.0000 [IU] | Freq: Three times a day (TID) | INTRAMUSCULAR | Status: DC
  Administered 2022-08-15 – 2022-08-18 (×9): 5000 [IU] via SUBCUTANEOUS
  Filled 2022-08-15 (×9): qty 1

## 2022-08-15 MED ORDER — DEXAMETHASONE SODIUM PHOSPHATE 10 MG/ML IJ SOLN
INTRAMUSCULAR | Status: DC | PRN
  Administered 2022-08-15: 10 mg via INTRAVENOUS

## 2022-08-15 MED ORDER — PROPOFOL 1000 MG/100ML IV EMUL
5.0000 ug/kg/min | INTRAVENOUS | Status: DC
  Administered 2022-08-15: 60 ug/kg/min via INTRAVENOUS
  Administered 2022-08-15: 30 ug/kg/min via INTRAVENOUS
  Administered 2022-08-15: 50 ug/kg/min via INTRAVENOUS
  Administered 2022-08-15 – 2022-08-16 (×3): 40 ug/kg/min via INTRAVENOUS
  Administered 2022-08-16: 35 ug/kg/min via INTRAVENOUS
  Filled 2022-08-15 (×7): qty 100

## 2022-08-15 MED ORDER — PHENYLEPHRINE HCL-NACL 20-0.9 MG/250ML-% IV SOLN
0.0000 ug/min | INTRAVENOUS | Status: DC
  Administered 2022-08-15 (×2): 20 ug/min via INTRAVENOUS
  Filled 2022-08-15 (×2): qty 250

## 2022-08-15 MED ORDER — IOHEXOL 300 MG/ML  SOLN: 150.0000 mL | Freq: Once | INTRAMUSCULAR | Status: DC | PRN

## 2022-08-15 MED ORDER — FAMOTIDINE IN NACL 20-0.9 MG/50ML-% IV SOLN
20.0000 mg | INTRAVENOUS | Status: AC
  Administered 2022-08-15: 20 mg via INTRAVENOUS
  Filled 2022-08-15: qty 50

## 2022-08-15 MED ORDER — ROCURONIUM BROMIDE 10 MG/ML (PF) SYRINGE
PREFILLED_SYRINGE | INTRAVENOUS | Status: DC | PRN
  Administered 2022-08-15: 50 mg via INTRAVENOUS

## 2022-08-15 MED ORDER — DIPHENHYDRAMINE HCL 50 MG/ML IJ SOLN
INTRAMUSCULAR | Status: AC
  Filled 2022-08-15: qty 1

## 2022-08-15 MED ORDER — FENTANYL CITRATE PF 50 MCG/ML IJ SOSY
25.0000 ug | PREFILLED_SYRINGE | INTRAMUSCULAR | Status: DC | PRN
  Administered 2022-08-15: 25 ug via INTRAVENOUS
  Filled 2022-08-15: qty 1

## 2022-08-15 MED ORDER — INSULIN ASPART 100 UNIT/ML IJ SOLN: 0.0000 [IU] | INTRAMUSCULAR | Status: DC

## 2022-08-15 MED ORDER — ACETAMINOPHEN 650 MG RE SUPP: 650.0000 mg | RECTAL | Status: DC | PRN

## 2022-08-15 MED ORDER — CLEVIDIPINE BUTYRATE 0.5 MG/ML IV EMUL: 0.0000 mg/h | INTRAVENOUS | Status: DC

## 2022-08-15 MED ORDER — IOHEXOL 300 MG/ML  SOLN
50.0000 mL | Freq: Once | INTRAMUSCULAR | Status: AC | PRN
  Administered 2022-08-15: 25 mL via INTRA_ARTERIAL

## 2022-08-15 MED ORDER — PHENYLEPHRINE HCL-NACL 20-0.9 MG/250ML-% IV SOLN
INTRAVENOUS | Status: DC | PRN
  Administered 2022-08-15: 30 ug/min via INTRAVENOUS

## 2022-08-15 MED ORDER — ASPIRIN 300 MG RE SUPP: 300.0000 mg | Freq: Every day | RECTAL | Status: DC

## 2022-08-15 MED ORDER — FAMOTIDINE IN NACL 20-0.9 MG/50ML-% IV SOLN
20.0000 mg | INTRAVENOUS | Status: DC
  Administered 2022-08-16: 20 mg via INTRAVENOUS
  Filled 2022-08-15: qty 50

## 2022-08-15 MED ORDER — METHYLPREDNISOLONE SODIUM SUCC 125 MG IJ SOLR
125.0000 mg | INTRAMUSCULAR | Status: AC
  Administered 2022-08-15: 125 mg via INTRAVENOUS
  Filled 2022-08-15: qty 2

## 2022-08-15 MED ORDER — FAMOTIDINE IN NACL 20-0.9 MG/50ML-% IV SOLN
20.0000 mg | Freq: Two times a day (BID) | INTRAVENOUS | Status: DC
  Administered 2022-08-15: 20 mg via INTRAVENOUS
  Filled 2022-08-15 (×2): qty 50

## 2022-08-15 MED ORDER — INSULIN ASPART 100 UNIT/ML IJ SOLN
0.0000 [IU] | Freq: Three times a day (TID) | INTRAMUSCULAR | Status: DC
  Administered 2022-08-15 (×2): 2 [IU] via SUBCUTANEOUS
  Administered 2022-08-15: 1 [IU] via SUBCUTANEOUS

## 2022-08-15 MED ORDER — ASPIRIN 81 MG PO TBEC: 81.0000 mg | DELAYED_RELEASE_TABLET | Freq: Every day | ORAL | Status: DC

## 2022-08-15 MED ORDER — SUCCINYLCHOLINE CHLORIDE 200 MG/10ML IV SOSY
PREFILLED_SYRINGE | INTRAVENOUS | Status: DC | PRN
  Administered 2022-08-15: 100 mg via INTRAVENOUS

## 2022-08-15 MED ORDER — CLOPIDOGREL BISULFATE 75 MG PO TABS
300.0000 mg | ORAL_TABLET | Freq: Once | ORAL | Status: AC
  Administered 2022-08-15: 300 mg
  Filled 2022-08-15: qty 4

## 2022-08-15 MED ORDER — ASPIRIN 81 MG PO CHEW: 81.0000 mg | CHEWABLE_TABLET | Freq: Every day | ORAL | Status: DC

## 2022-08-15 MED ORDER — ORAL CARE MOUTH RINSE: 15.0000 mL | OROMUCOSAL | Status: DC | PRN

## 2022-08-15 MED ORDER — ACETAMINOPHEN 325 MG PO TABS
650.0000 mg | ORAL_TABLET | ORAL | Status: DC | PRN
  Administered 2022-08-16: 650 mg via ORAL
  Filled 2022-08-15: qty 2

## 2022-08-15 MED ORDER — MIDAZOLAM HCL 2 MG/2ML IJ SOLN
1.0000 mg | Freq: Once | INTRAMUSCULAR | Status: DC
  Filled 2022-08-15: qty 2

## 2022-08-15 MED ORDER — FENTANYL CITRATE PF 50 MCG/ML IJ SOSY
25.0000 ug | PREFILLED_SYRINGE | INTRAMUSCULAR | Status: DC | PRN
  Administered 2022-08-15 – 2022-08-16 (×4): 25 ug via INTRAVENOUS
  Filled 2022-08-15 (×5): qty 1

## 2022-08-15 MED ORDER — FENTANYL CITRATE (PF) 250 MCG/5ML IJ SOLN
INTRAMUSCULAR | Status: DC | PRN
  Administered 2022-08-15: 100 ug via INTRAVENOUS

## 2022-08-15 MED ORDER — ASPIRIN 81 MG PO CHEW
81.0000 mg | CHEWABLE_TABLET | Freq: Every day | ORAL | Status: DC
  Administered 2022-08-15: 81 mg
  Filled 2022-08-15: qty 1

## 2022-08-16 ENCOUNTER — Encounter (HOSPITAL_COMMUNITY): Payer: BC Managed Care – PPO

## 2022-08-16 ENCOUNTER — Other Ambulatory Visit (HOSPITAL_COMMUNITY): Payer: BC Managed Care – PPO

## 2022-08-16 DIAGNOSIS — I63511 Cerebral infarction due to unspecified occlusion or stenosis of right middle cerebral artery: Secondary | ICD-10-CM | POA: Diagnosis not present

## 2022-08-16 LAB — BASIC METABOLIC PANEL
Anion gap: 13 (ref 5–15)
CO2: 18 mmol/L — ABNORMAL LOW (ref 22–32)
Calcium: 8.4 mg/dL — ABNORMAL LOW (ref 8.9–10.3)
Creatinine, Ser: 4.25 mg/dL — ABNORMAL HIGH (ref 0.61–1.24)
Potassium: 4.7 mmol/L (ref 3.5–5.1)
Sodium: 137 mmol/L (ref 135–145)

## 2022-08-16 LAB — RAPID URINE DRUG SCREEN, HOSP PERFORMED
Barbiturates: NOT DETECTED
Cocaine: NOT DETECTED
Opiates: NOT DETECTED
Tetrahydrocannabinol: NOT DETECTED

## 2022-08-16 LAB — GLUCOSE, CAPILLARY
Glucose-Capillary: 127 mg/dL — ABNORMAL HIGH (ref 70–99)
Glucose-Capillary: 139 mg/dL — ABNORMAL HIGH (ref 70–99)
Glucose-Capillary: 138 mg/dL — ABNORMAL HIGH (ref 70–99)
Glucose-Capillary: 139 mg/dL — ABNORMAL HIGH (ref 70–99)

## 2022-08-16 LAB — C1 ESTERASE INHIBITOR: C1INH SerPl-mCnc: 35 mg/dL (ref 21–39)

## 2022-08-16 LAB — TRIGLYCERIDES: Triglycerides: 125 mg/dL (ref <150)

## 2022-08-16 LAB — C4 COMPLEMENT: Complement C4, Body Fluid: 28 mg/dL (ref 12–38)

## 2022-08-16 MED ORDER — ASPIRIN 81 MG PO CHEW
81.0000 mg | CHEWABLE_TABLET | Freq: Every day | ORAL | Status: DC
  Administered 2022-08-16 – 2022-08-18 (×3): 81 mg via ORAL
  Filled 2022-08-16 (×3): qty 1

## 2022-08-16 MED ORDER — CLOPIDOGREL BISULFATE 75 MG PO TABS
75.0000 mg | ORAL_TABLET | Freq: Every day | ORAL | Status: DC
  Administered 2022-08-16 – 2022-08-18 (×3): 75 mg via ORAL
  Filled 2022-08-16 (×3): qty 1

## 2022-08-16 MED ORDER — ORAL CARE MOUTH RINSE: 15.0000 mL | OROMUCOSAL | Status: DC | PRN

## 2022-08-16 MED ORDER — FAMOTIDINE 20 MG PO TABS
20.0000 mg | ORAL_TABLET | Freq: Once | ORAL | Status: AC
  Administered 2022-08-17: 20 mg via ORAL
  Filled 2022-08-16: qty 1

## 2022-08-16 MED ORDER — DIPHENHYDRAMINE HCL 12.5 MG/5ML PO ELIX
12.5000 mg | ORAL_SOLUTION | Freq: Four times a day (QID) | ORAL | Status: DC
  Administered 2022-08-16 – 2022-08-17 (×3): 12.5 mg via ORAL
  Filled 2022-08-16 (×4): qty 5

## 2022-08-16 MED ORDER — INSULIN ASPART 100 UNIT/ML IJ SOLN
0.0000 [IU] | Freq: Three times a day (TID) | INTRAMUSCULAR | Status: DC
  Administered 2022-08-17 (×2): 1 [IU] via SUBCUTANEOUS
  Administered 2022-08-18: 2 [IU] via SUBCUTANEOUS

## 2022-08-16 MED ORDER — CHLORHEXIDINE GLUCONATE CLOTH 2 % EX PADS
6.0000 | MEDICATED_PAD | Freq: Every day | CUTANEOUS | Status: DC
  Administered 2022-08-17 – 2022-08-18 (×2): 6 via TOPICAL

## 2022-08-16 MED ORDER — CARVEDILOL 12.5 MG PO TABS
12.5000 mg | ORAL_TABLET | Freq: Two times a day (BID) | ORAL | Status: DC
  Administered 2022-08-16 – 2022-08-18 (×5): 12.5 mg via ORAL
  Filled 2022-08-16 (×5): qty 1

## 2022-08-16 MED ORDER — ATORVASTATIN CALCIUM 10 MG PO TABS
20.0000 mg | ORAL_TABLET | Freq: Every day | ORAL | Status: DC
  Administered 2022-08-16 – 2022-08-18 (×3): 20 mg via ORAL
  Filled 2022-08-16 (×3): qty 2

## 2022-08-16 MED ORDER — ASPIRIN 300 MG RE SUPP: 300.0000 mg | Freq: Every day | RECTAL | Status: DC

## 2022-08-17 ENCOUNTER — Inpatient Hospital Stay (HOSPITAL_COMMUNITY): Payer: BC Managed Care – PPO

## 2022-08-17 ENCOUNTER — Other Ambulatory Visit: Payer: Self-pay

## 2022-08-17 DIAGNOSIS — I63511 Cerebral infarction due to unspecified occlusion or stenosis of right middle cerebral artery: Secondary | ICD-10-CM | POA: Diagnosis not present

## 2022-08-17 DIAGNOSIS — I639 Cerebral infarction, unspecified: Secondary | ICD-10-CM | POA: Diagnosis not present

## 2022-08-17 LAB — BASIC METABOLIC PANEL
Anion gap: 10 (ref 5–15)
Calcium: 8.3 mg/dL — ABNORMAL LOW (ref 8.9–10.3)
Creatinine, Ser: 3.82 mg/dL — ABNORMAL HIGH (ref 0.61–1.24)
GFR, Estimated: 17 mL/min — ABNORMAL LOW (ref >60)
Potassium: 4.2 mmol/L (ref 3.5–5.1)
Sodium: 137 mmol/L (ref 135–145)

## 2022-08-17 LAB — GLUCOSE, CAPILLARY
Glucose-Capillary: 121 mg/dL — ABNORMAL HIGH (ref 70–99)
Glucose-Capillary: 126 mg/dL — ABNORMAL HIGH (ref 70–99)
Glucose-Capillary: 107 mg/dL — ABNORMAL HIGH (ref 70–99)

## 2022-08-18 ENCOUNTER — Encounter (HOSPITAL_COMMUNITY)
Admission: AD | Disposition: A | Discharge status: Rehabilitation Facility | Payer: Self-pay | Source: Other Acute Inpatient Hospital | Attending: Neurology

## 2022-08-18 ENCOUNTER — Inpatient Hospital Stay (HOSPITAL_COMMUNITY): Payer: BC Managed Care – PPO

## 2022-08-18 ENCOUNTER — Encounter (HOSPITAL_COMMUNITY): Payer: Self-pay | Admitting: Student in an Organized Health Care Education/Training Program

## 2022-08-18 ENCOUNTER — Inpatient Hospital Stay (HOSPITAL_COMMUNITY)
Admission: RE | Admit: 2022-08-18 | Discharge: 2022-08-24 | DRG: 945 | Disposition: A | Discharge status: Home / Self Care | Payer: BC Managed Care – PPO | Source: Intra-hospital | Attending: Physical Medicine & Rehabilitation | Admitting: Physical Medicine & Rehabilitation

## 2022-08-18 ENCOUNTER — Encounter: Payer: Self-pay | Admitting: Anesthesiology

## 2022-08-18 ENCOUNTER — Inpatient Hospital Stay (HOSPITAL_COMMUNITY): Payer: BC Managed Care – PPO | Admitting: Certified Registered"

## 2022-08-18 DIAGNOSIS — I2510 Atherosclerotic heart disease of native coronary artery without angina pectoris: Secondary | ICD-10-CM | POA: Diagnosis present

## 2022-08-18 DIAGNOSIS — I63512 Cerebral infarction due to unspecified occlusion or stenosis of left middle cerebral artery: Secondary | ICD-10-CM | POA: Diagnosis not present

## 2022-08-18 DIAGNOSIS — I639 Cerebral infarction, unspecified: Secondary | ICD-10-CM

## 2022-08-18 DIAGNOSIS — I129 Hypertensive chronic kidney disease with stage 1 through stage 4 chronic kidney disease, or unspecified chronic kidney disease: Secondary | ICD-10-CM | POA: Diagnosis present

## 2022-08-18 DIAGNOSIS — R5381 Other malaise: Secondary | ICD-10-CM | POA: Diagnosis present

## 2022-08-18 DIAGNOSIS — G4733 Obstructive sleep apnea (adult) (pediatric): Secondary | ICD-10-CM | POA: Diagnosis present

## 2022-08-18 DIAGNOSIS — I63511 Cerebral infarction due to unspecified occlusion or stenosis of right middle cerebral artery: Secondary | ICD-10-CM | POA: Diagnosis not present

## 2022-08-18 DIAGNOSIS — E669 Obesity, unspecified: Secondary | ICD-10-CM | POA: Diagnosis present

## 2022-08-18 DIAGNOSIS — Q2112 Patent foramen ovale: Secondary | ICD-10-CM

## 2022-08-18 DIAGNOSIS — N184 Chronic kidney disease, stage 4 (severe): Secondary | ICD-10-CM | POA: Diagnosis present

## 2022-08-18 DIAGNOSIS — Z7982 Long term (current) use of aspirin: Secondary | ICD-10-CM | POA: Diagnosis not present

## 2022-08-18 DIAGNOSIS — I69390 Apraxia following cerebral infarction: Secondary | ICD-10-CM

## 2022-08-18 DIAGNOSIS — E1122 Type 2 diabetes mellitus with diabetic chronic kidney disease: Secondary | ICD-10-CM | POA: Diagnosis present

## 2022-08-18 DIAGNOSIS — I69320 Aphasia following cerebral infarction: Secondary | ICD-10-CM

## 2022-08-18 DIAGNOSIS — E785 Hyperlipidemia, unspecified: Secondary | ICD-10-CM | POA: Diagnosis present

## 2022-08-18 DIAGNOSIS — J9600 Acute respiratory failure, unspecified whether with hypoxia or hypercapnia: Secondary | ICD-10-CM | POA: Diagnosis not present

## 2022-08-18 DIAGNOSIS — Z888 Allergy status to other drugs, medicaments and biological substances status: Secondary | ICD-10-CM

## 2022-08-18 DIAGNOSIS — D631 Anemia in chronic kidney disease: Secondary | ICD-10-CM | POA: Diagnosis present

## 2022-08-18 DIAGNOSIS — I10 Essential (primary) hypertension: Secondary | ICD-10-CM | POA: Diagnosis present

## 2022-08-18 DIAGNOSIS — E119 Type 2 diabetes mellitus without complications: Secondary | ICD-10-CM

## 2022-08-18 DIAGNOSIS — Z79899 Other long term (current) drug therapy: Secondary | ICD-10-CM | POA: Diagnosis not present

## 2022-08-18 HISTORY — PX: LOOP RECORDER INSERTION: EP1214

## 2022-08-18 HISTORY — PX: TEE WITHOUT CARDIOVERSION: SHX5443

## 2022-08-18 LAB — GLUCOSE, CAPILLARY
Glucose-Capillary: 110 mg/dL — ABNORMAL HIGH (ref 70–99)
Glucose-Capillary: 102 mg/dL — ABNORMAL HIGH (ref 70–99)
Glucose-Capillary: 157 mg/dL — ABNORMAL HIGH (ref 70–99)

## 2022-08-18 LAB — CBC
HCT: 35.2 % — ABNORMAL LOW (ref 39.0–52.0)
Hemoglobin: 11.5 g/dL — ABNORMAL LOW (ref 13.0–17.0)
MCH: 27.7 pg (ref 26.0–34.0)
MCHC: 32.7 g/dL (ref 30.0–36.0)
MCV: 84.8 fL (ref 80.0–100.0)
Platelets: 218 10*3/uL (ref 150–400)
RBC: 4.15 MIL/uL — ABNORMAL LOW (ref 4.22–5.81)
RDW: 14 % (ref 11.5–15.5)
WBC: 9.1 10*3/uL (ref 4.0–10.5)
nRBC: 0 % (ref 0.0–0.2)

## 2022-08-18 LAB — CREATININE, SERUM
Creatinine, Ser: 3.27 mg/dL — ABNORMAL HIGH (ref 0.61–1.24)
GFR, Estimated: 21 mL/min — ABNORMAL LOW (ref >60)

## 2022-08-18 LAB — ECHO TEE

## 2022-08-18 SURGERY — ECHOCARDIOGRAM, TRANSESOPHAGEAL
Anesthesia: Monitor Anesthesia Care

## 2022-08-18 SURGERY — LOOP RECORDER INSERTION

## 2022-08-18 MED ORDER — CLOPIDOGREL BISULFATE 75 MG PO TABS: 75.0000 mg | ORAL_TABLET | Freq: Every day | ORAL | Status: DC

## 2022-08-18 MED ORDER — ATORVASTATIN CALCIUM 10 MG PO TABS
20.0000 mg | ORAL_TABLET | Freq: Every day | ORAL | Status: DC
  Administered 2022-08-19 – 2022-08-24 (×6): 20 mg via ORAL
  Filled 2022-08-18 (×6): qty 2

## 2022-08-18 MED ORDER — HEPARIN SODIUM (PORCINE) 5000 UNIT/ML IJ SOLN: 5000.0000 [IU] | Freq: Three times a day (TID) | INTRAMUSCULAR | Status: DC

## 2022-08-18 MED ORDER — ASPIRIN 81 MG PO CHEW: 81.0000 mg | CHEWABLE_TABLET | Freq: Every day | ORAL | Status: DC

## 2022-08-18 MED ORDER — ATORVASTATIN CALCIUM 20 MG PO TABS: 20.0000 mg | ORAL_TABLET | Freq: Every day | ORAL | Status: DC

## 2022-08-18 MED ORDER — BUTAMBEN-TETRACAINE-BENZOCAINE 2-2-14 % EX AERO
INHALATION_SPRAY | CUTANEOUS | Status: DC | PRN
  Administered 2022-08-18: 1 via TOPICAL

## 2022-08-18 MED ORDER — LIDOCAINE HCL (CARDIAC) PF 100 MG/5ML IV SOSY
PREFILLED_SYRINGE | INTRAVENOUS | Status: DC | PRN
  Administered 2022-08-18: 60 mg via INTRAVENOUS

## 2022-08-18 MED ORDER — SODIUM CHLORIDE 0.9 % IV SOLN: INTRAVENOUS | Status: DC

## 2022-08-18 MED ORDER — ACETAMINOPHEN 325 MG PO TABS: 650.0000 mg | ORAL_TABLET | ORAL | Status: DC | PRN

## 2022-08-18 MED ORDER — PHENYLEPHRINE 80 MCG/ML (10ML) SYRINGE FOR IV PUSH (FOR BLOOD PRESSURE SUPPORT)
PREFILLED_SYRINGE | INTRAVENOUS | Status: DC | PRN
  Administered 2022-08-18 (×5): 80 ug via INTRAVENOUS

## 2022-08-18 MED ORDER — SORBITOL 70 % SOLN: 30.0000 mL | Freq: Every day | Status: DC | PRN

## 2022-08-18 MED ORDER — TRAZODONE HCL 50 MG PO TABS: 25.0000 mg | ORAL_TABLET | Freq: Every evening | ORAL | Status: DC | PRN

## 2022-08-18 MED ORDER — LIDOCAINE-EPINEPHRINE 1 %-1:100000 IJ SOLN
INTRAMUSCULAR | Status: DC | PRN
  Administered 2022-08-18: 30 mL

## 2022-08-18 MED ORDER — CARVEDILOL 12.5 MG PO TABS
12.5000 mg | ORAL_TABLET | Freq: Two times a day (BID) | ORAL | Status: DC
  Administered 2022-08-18 – 2022-08-24 (×12): 12.5 mg via ORAL
  Filled 2022-08-18 (×12): qty 1

## 2022-08-18 MED ORDER — LIDOCAINE-EPINEPHRINE 1 %-1:100000 IJ SOLN
INTRAMUSCULAR | Status: AC
  Filled 2022-08-18: qty 1

## 2022-08-18 MED ORDER — INSULIN ASPART 100 UNIT/ML IJ SOLN
0.0000 [IU] | Freq: Three times a day (TID) | INTRAMUSCULAR | Status: DC
  Administered 2022-08-20 – 2022-08-22 (×3): 1 [IU] via SUBCUTANEOUS

## 2022-08-18 MED ORDER — DEXMEDETOMIDINE HCL IN NACL 80 MCG/20ML IV SOLN
INTRAVENOUS | Status: DC | PRN
  Administered 2022-08-18: 4 ug via INTRAVENOUS
  Administered 2022-08-18: 8 ug via INTRAVENOUS

## 2022-08-18 MED ORDER — FLEET ENEMA 7-19 GM/118ML RE ENEM: 1.0000 | ENEMA | Freq: Once | RECTAL | Status: DC | PRN

## 2022-08-18 MED ORDER — METHOCARBAMOL 500 MG PO TABS: 500.0000 mg | ORAL_TABLET | Freq: Four times a day (QID) | ORAL | Status: DC | PRN

## 2022-08-18 MED ORDER — ASPIRIN 300 MG RE SUPP
300.0000 mg | Freq: Every day | RECTAL | Status: DC
  Filled 2022-08-18: qty 1

## 2022-08-18 MED ORDER — SENNOSIDES-DOCUSATE SODIUM 8.6-50 MG PO TABS: 1.0000 | ORAL_TABLET | Freq: Every evening | ORAL | Status: DC | PRN

## 2022-08-18 MED ORDER — ALUM & MAG HYDROXIDE-SIMETH 200-200-20 MG/5ML PO SUSP: 30.0000 mL | ORAL | Status: DC | PRN

## 2022-08-18 MED ORDER — HEPARIN SODIUM (PORCINE) 5000 UNIT/ML IJ SOLN
5000.0000 [IU] | Freq: Three times a day (TID) | INTRAMUSCULAR | Status: DC
  Administered 2022-08-18 – 2022-08-23 (×15): 5000 [IU] via SUBCUTANEOUS
  Filled 2022-08-18 (×15): qty 1

## 2022-08-18 MED ORDER — POLYETHYLENE GLYCOL 3350 17 G PO PACK: 17.0000 g | PACK | Freq: Every day | ORAL | Status: DC | PRN

## 2022-08-18 MED ORDER — GUAIFENESIN-DM 100-10 MG/5ML PO SYRP: 5.0000 mL | ORAL_SOLUTION | Freq: Four times a day (QID) | ORAL | Status: DC | PRN

## 2022-08-18 MED ORDER — PROPOFOL 500 MG/50ML IV EMUL
INTRAVENOUS | Status: DC | PRN
  Administered 2022-08-18: 150 ug/kg/min via INTRAVENOUS

## 2022-08-18 MED ORDER — INSULIN ASPART 100 UNIT/ML IJ SOLN: 0.0000 [IU] | Freq: Three times a day (TID) | INTRAMUSCULAR | 11 refills | Status: DC

## 2022-08-18 MED ORDER — ASPIRIN 81 MG PO CHEW
81.0000 mg | CHEWABLE_TABLET | Freq: Every day | ORAL | Status: DC
  Administered 2022-08-19 – 2022-08-24 (×6): 81 mg via ORAL
  Filled 2022-08-18 (×6): qty 1

## 2022-08-18 MED ORDER — CLOPIDOGREL BISULFATE 75 MG PO TABS
75.0000 mg | ORAL_TABLET | Freq: Every day | ORAL | Status: DC
  Administered 2022-08-19 – 2022-08-24 (×6): 75 mg via ORAL
  Filled 2022-08-18 (×6): qty 1

## 2022-08-18 MED ORDER — PROPOFOL 10 MG/ML IV BOLUS
INTRAVENOUS | Status: DC | PRN
  Administered 2022-08-18: 60 mg via INTRAVENOUS
  Administered 2022-08-18 (×3): 20 mg via INTRAVENOUS
  Administered 2022-08-18: 10 mg via INTRAVENOUS
  Administered 2022-08-18: 20 mg via INTRAVENOUS
  Administered 2022-08-18: 10 mg via INTRAVENOUS
  Administered 2022-08-18: 20 mg via INTRAVENOUS
  Administered 2022-08-18: 10 mg via INTRAVENOUS
  Administered 2022-08-18 (×2): 20 mg via INTRAVENOUS

## 2022-08-18 SURGICAL SUPPLY — 2 items
PACK LOOP INSERTION (CUSTOM PROCEDURE TRAY) ×1 IMPLANT
SYSTEM MONITOR REVEAL LINQ II (Prosthesis & Implant Heart) IMPLANT

## 2022-08-18 NOTE — Progress Notes (Signed)
Attempted to see patient prior to admission to IPR x3; was out of room getting TEE, then loop recorder. Records reviewed, discussed with family expectations of IPR, all questions answered. Per chart review and pending post-op recovery, patient still cleared for admission to IPR.    Angelina Sheriff, DO 08/18/2022

## 2022-08-18 NOTE — Progress Notes (Signed)
Physical Therapy Treatment Patient Details Name: [name] MRN: [id number] DOB: 1965-06-14 Today's Date: 08/18/2022   History of Present Illness 57 yo male transferred to [facility] from OSH on 4/22 for endovascular thrombectomy. Wife found him in the bathroom weak on the R, confused, and unable to talk. 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 Pt also with angioedema s/p TNK. PMH: CKD4, htn, hyperlipidemia, t2dm and gout    PT Comments    Pt received in bed. Mod I bed mobility, supervision transfers, and min guard assist amb 300' without AD. Pt continues to demo R drift during gait with decreased awareness of obstacles on R. Pt following simple commands 60% of trials. Pt more successful with demonstration cues as opposed to verbal cues. Pt very motivated to progress to PLOF. He has good family support. Pt scheduled for TEE today. Current POC remains appropriate.    Recommendations for follow up therapy are one component of a multi-disciplinary discharge planning process, led by the attending physician.  Recommendations may be updated based on patient status, additional functional criteria and insurance authorization.  Follow Up Recommendations       Assistance Recommended at Discharge Frequent or constant Supervision/Assistance  Patient can return home with the following A little help with walking and/or transfers;A little help with bathing/dressing/bathroom;Direct supervision/assist for medications management;Direct supervision/assist for financial management;Assist for transportation;Help with stairs or ramp for entrance;Assistance with cooking/housework   Equipment Recommendations  None recommended by PT    Recommendations for Other Services       Precautions / Restrictions Precautions Precautions: Fall;Other (comment) Precaution Comments: expressive and receptive difficulties     Mobility  Bed Mobility Overal bed mobility: Needs Assistance Bed Mobility:  Supine to Sit, Sit to Supine Rolling: Modified independent (Device/Increase time)   Supine to sit: Supervision Sit to supine: Supervision   General bed mobility comments: supervision/cues for initiation    Transfers Overall transfer level: Needs assistance Equipment used: None Transfers: Sit to/from Stand Sit to Stand: Min guard   Step pivot transfers: Min guard       General transfer comment: cues for initiation    Ambulation/Gait Ambulation/Gait assistance: Min guard Gait Distance (Feet): 300 Feet Assistive device: None Gait Pattern/deviations: Step-through pattern, Drifts right/left Gait velocity: mildly decreased Gait velocity interpretation: 1.31 - 2.62 ft/sec, indicative of limited community ambulator   General Gait Details: Drifting R. Decreased awareness of obstacles on R, requiring verbal/tactile cues to avoid.   Stairs             Wheelchair Mobility    Modified Rankin (Stroke Patients Only) Modified Rankin (Stroke Patients Only) Pre-Morbid Rankin Score: No symptoms Modified Rankin: Moderate disability     Balance Overall balance assessment: Needs assistance Sitting-balance support: Feet supported, No upper extremity supported Sitting balance-Leahy Scale: Normal     Standing balance support: No upper extremity supported, During functional activity Standing balance-Leahy Scale: Good                              Cognition Arousal/Alertness: Awake/alert Behavior During Therapy: Flat affect, Impulsive Overall Cognitive Status: Difficult to assess Area of Impairment: Safety/judgement, Awareness, Problem solving, Following commands                       Following Commands: Follows one step commands inconsistently Safety/Judgement: Decreased awareness of safety, Decreased awareness of deficits Awareness: Emergent Problem Solving:  Requires tactile cues, Difficulty sequencing General Comments: Following simple commands 60%  of trials. Does best with demonstration as opposed to verbal cues. Verbal and motor perseveration noted but appears to be improving.        Exercises      General Comments General comments (skin integrity, edema, etc.): VSS on RA      Pertinent Vitals/Pain Pain Assessment Pain Assessment: Faces Faces Pain Scale: No hurt    Home Living                          Prior Function            PT Goals (current goals can now be found in the care plan section) Acute Rehab PT Goals Patient Stated Goal: home per wife Progress towards PT goals: Progressing toward goals    Frequency    Min 4X/week      PT Plan Current plan remains appropriate    Co-evaluation              AM-PAC PT "6 Clicks" Mobility   Outcome Measure  Help needed turning from your back to your side while in a flat bed without using bedrails?: None Help needed moving from lying on your back to sitting on the side of a flat bed without using bedrails?: None Help needed moving to and from a bed to a chair (including a wheelchair)?: A Little Help needed standing up from a chair using your arms (e.g., wheelchair or bedside chair)?: A Little Help needed to walk in hospital room?: A Little Help needed climbing 3-5 steps with a railing? : A Lot 6 Click Score: 19    End of Session Equipment Utilized During Treatment: Gait belt Activity Tolerance: Patient tolerated treatment well Patient left: in bed;with call bell/phone within reach;with family/visitor present Nurse Communication: Mobility status PT Visit Diagnosis: Unsteadiness on feet (R26.81);Difficulty in walking, not elsewhere classified (R26.2);Other symptoms and signs involving the nervous system (Z61.096)     Time: 0454-0981 PT Time Calculation (min) (ACUTE ONLY): 15 min  Charges:  $Gait Training: 8-22 mins                     [name]., PT  Office # [phone]    [name] 08/18/2022, 8:47 AM

## 2022-08-18 NOTE — Progress Notes (Signed)
  Echocardiogram Echocardiogram Transesophageal has been performed.  Janalyn Harder 08/18/2022, 12:39 PM

## 2022-08-18 NOTE — CV Procedure (Signed)
INDICATIONS: Stroke  PROCEDURE:   Informed consent was obtained prior to the procedure. The risks, benefits and alternatives for the procedure were discussed and the patient comprehended these risks.  Risks include, but are not limited to, cough, sore throat, vomiting, nausea, somnolence, esophageal and stomach trauma or perforation, bleeding, low blood pressure, aspiration, pneumonia, infection, trauma to the teeth and death.    After a procedural time-out, the oropharynx was anesthetized with 20% benzocaine spray.   During this procedure the patient was administered 300 mg propofol to achieve and maintain moderate conscious sedation.  The patient's heart rate, blood pressure, and oxygen saturationweare monitored continuously during the procedure. The period of conscious sedation was 15 minutes, of which I was present face-to-face 100% of this time.  The transesophageal probe was inserted in the esophagus and stomach without difficulty and multiple views were obtained.  The patient was kept under observation until the patient left the procedure room.  The patient left the procedure room in stable condition.   Agitated microbubble saline contrast was administered.  COMPLICATIONS:    There were no immediate complications.  FINDINGS:  Normal LV and RV function No significant valve dysfunction No LAA thrombus Small PFO   RECOMMENDATIONS:     Continue with loop recorder implantation  Can consider structural eval for small PFO closure  Time Spent Directly with the Patient:  15 minutes   [name] 08/18/2022, 12:21 PM

## 2022-08-18 NOTE — Anesthesia Postprocedure Evaluation (Signed)
Anesthesia Post Note  Patient: [name]  Procedure(s) Performed: TRANSESOPHAGEAL ECHOCARDIOGRAM     Patient location during evaluation: PACU Anesthesia Type: MAC Level of consciousness: awake and alert Pain management: pain level controlled Vital Signs Assessment: post-procedure vital signs reviewed and stable Respiratory status: spontaneous breathing, nonlabored ventilation, respiratory function stable and patient connected to nasal cannula oxygen Cardiovascular status: stable and blood pressure returned to baseline Postop Assessment: no apparent nausea or vomiting Anesthetic complications: no  No notable events documented.  Last Vitals:  Vitals:   08/18/22 1220 08/18/22 1225  BP: 112/81 107/83  Pulse: 72 76  Resp: 14 17  Temp:    SpO2: 96% 96%    Last Pain:  Vitals:   08/18/22 1215  TempSrc: Temporal  PainSc: Asleep    LLE Motor Response: Responds to commands (08/18/22 1223)   RLE Motor Response: Responds to commands (08/18/22 1223)        Crit Obremski S

## 2022-08-18 NOTE — Progress Notes (Signed)
0115 patient asleep wife at bedside

## 2022-08-18 NOTE — Anesthesia Preprocedure Evaluation (Addendum)
Anesthesia Evaluation  Patient identified by MRN, date of birth, ID band Patient awake    Reviewed: Allergy & Precautions, H&P , NPO status , Patient's Chart, lab work & pertinent test results  Airway Mallampati: II  TM Distance: >3 FB Neck ROM: Full    Dental no notable dental hx.    Pulmonary sleep apnea    Pulmonary exam normal breath sounds clear to auscultation       Cardiovascular hypertension, Normal cardiovascular exam Rhythm:Regular Rate:Normal     Neuro/Psych aphasic CVA, Residual Symptoms  negative psych ROS   GI/Hepatic negative GI ROS, Neg liver ROS,,,  Endo/Other  negative endocrine ROS    Renal/GU negative Renal ROS  negative genitourinary   Musculoskeletal negative musculoskeletal ROS (+)    Abdominal   Peds negative pediatric ROS (+)  Hematology negative hematology ROS (+)   Anesthesia Other Findings   Reproductive/Obstetrics negative OB ROS                             Anesthesia Physical Anesthesia Plan  ASA: 3  Anesthesia Plan: MAC   Post-op Pain Management: Minimal or no pain anticipated   Induction: Intravenous  PONV Risk Score and Plan: 1 and Propofol infusion and Treatment may vary due to age or medical condition  Airway Management Planned: Simple Face Mask  Additional Equipment:   Intra-op Plan:   Post-operative Plan:   Informed Consent: I have reviewed the patients History and Physical, chart, labs and discussed the procedure including the risks, benefits and alternatives for the proposed anesthesia with the patient or authorized representative who has indicated his/her understanding and acceptance.     Dental advisory given  Plan Discussed with: CRNA and Surgeon  Anesthesia Plan Comments:        Anesthesia Quick Evaluation

## 2022-08-18 NOTE — Progress Notes (Signed)
Inpatient Rehabilitation Admission Medication Review by a Pharmacist  A complete drug regimen review was completed for this patient to identify any potential clinically significant medication issues.  High Risk Drug Classes Is patient taking? Indication by Medication  Antipsychotic No   Anticoagulant Yes Heparin- vte pps  Antibiotic No   Opioid No   Antiplatelet Yes Asa, plavix- CVA ppx x3 weeks f/b Plavix alone (last day of ASA 09/05/2022)  Hypoglycemics/insulin Yes Insulin- T2DM  Vasoactive Medication Yes Coreg- HTN  Chemotherapy No   Other Yes Lipitor- HLD Trazodone- sleep     Type of Medication Issue Identified Description of Issue Recommendation(s)  Drug Interaction(s) (clinically significant)     Duplicate Therapy     Allergy     No Medication Administration End Date     Incorrect Dose     Additional Drug Therapy Needed     Significant med changes from prior encounter (inform family/care partners about these prior to discharge).    Other  PTA meds: Norvasc Lisinopril Febuxostat Vitamin D [place] Restart PTA meds when and if necessary during CIR admission or at time of discharge, if warranted     Clinically significant medication issues were identified that warrant physician communication and completion of prescribed/recommended actions by midnight of the next day:  No   Time spent performing this drug regimen review (minutes):  30   Sabastion Hrdlicka BS, PharmD, BCPS Clinical Pharmacist 08/18/2022 7:01 PM  Contact: [phone] after 3 PM  "Be curious, not judgmental..." -Debbora Dus

## 2022-08-18 NOTE — Progress Notes (Addendum)
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 Active Problems: Expressive and receptive aphasia   Essential hypertension, benign   Hyperlipidemia   DM (diabetes mellitus), type 2   Obesity (BMI 30-39.9)   CAD (coronary artery disease)   OSA (obstructive sleep apnea) PFO Hyperlipidemia    Medications to be continued on Rehab Allergies as of 08/18/2022       Reactions   Amitriptyline Anaphylaxis   Wife reported he had a cardiac arrest in the setting of starting amitriptyline in 2019   Tenecteplase Anaphylaxis   Hives and oral edema necessitating intubation following administration of Tenecteplase   Lisinopril Other (See Comments)   Angioedema suspected from TNK d/t timing of reaction; however, adding lisinopril so providers could be informed/cautious with future care (was on lisinopril pta).        Medication List     STOP taking these medications    amLODipine 10 MG tablet Commonly known as: NORVASC   aspirin EC 81 MG tablet Replaced by: aspirin 81 MG chewable tablet   chlorthalidone 25 MG tablet Commonly known as: HYGROTON   febuxostat 40 MG tablet Commonly known as: ULORIC   lisinopril 10 MG tablet Commonly known as: ZESTRIL   VITAMIN D PO       TAKE these medications    acetaminophen 325 MG tablet Commonly known as: TYLENOL Take 2 tablets (650 mg total) by mouth every 4 (four) hours as needed for mild pain (or temp > 37.5 C (99.5 F)). What changed:  medication strength how much to take when to take this reasons to take  this   aspirin 81 MG chewable tablet Chew 1 tablet (81 mg total) by mouth daily. Start taking on: August 19, 2022 Replaces: aspirin EC 81 MG tablet   atorvastatin 20 MG tablet Commonly known as: LIPITOR Take 1 tablet (20 mg total) by mouth daily. Start taking on: August 19, 2022   carvedilol 25 MG tablet Commonly known as: COREG Take 12.5 mg by mouth 2 (two) times daily.   clopidogrel 75 MG tablet Commonly known as: PLAVIX Take 1 tablet (75 mg total) by mouth daily. Start taking on: August 19, 2022   heparin 5000 UNIT/ML injection Inject 1 mL (5,000 Units total) into the skin every 8 (eight) hours.   insulin aspart 100 UNIT/ML injection Commonly known as: novoLOG Inject 0-9 Units into the skin 4 (four) times daily -  with meals and at bedtime.   senna-docusate 8.6-50 MG tablet Commonly known as: Senokot-S Take 1 tablet by mouth at bedtime as needed for moderate constipation or mild constipation.        LABORATORY STUDIES CBC    Component Value Date/Time   WBC 9.5 08/14/2022 2216   RBC 4.40 08/14/2022 2216   HGB 12.1 (L) 08/14/2022 2216   HCT 38.2 (L) 08/14/2022 2216   PLT 235 08/14/2022 2216   MCV 86.8 08/14/2022 2216   MCH 27.5 08/14/2022 2216   MCHC 31.7 08/14/2022 2216   RDW 14.3 08/14/2022 2216   LYMPHSABS 2.0 08/14/2022 2216   MONOABS 0.8 08/14/2022  2216   EOSABS 0.3 08/14/2022 2216   BASOSABS 0.1 08/14/2022 2216   CMP    Component Value Date/Time   NA 137 08/17/2022 0740   K 4.2 08/17/2022 0740   CL 107 08/17/2022 0740   CO2 20 (L) 08/17/2022 0740   GLUCOSE 134 (H) 08/17/2022 0740   BUN 59 (H) 08/17/2022 0740   CREATININE 3.82 (H) 08/17/2022 0740   CALCIUM 8.3 (L) 08/17/2022 0740   PROT 7.7 08/14/2022 2216   ALBUMIN 4.3 08/14/2022 2216   AST 30 08/14/2022 2216   ALT 19 08/14/2022 2216   ALKPHOS 64 08/14/2022 2216   BILITOT 0.8 08/14/2022 2216   GFRNONAA 17 (L) 08/17/2022 0740   COAGS Lab Results  Component Value Date   INR 1.1 08/14/2022    Lipid Panel    Component Value Date/Time   CHOL 172 08/15/2022 0424   TRIG 125 08/16/2022 0647   HDL 36 (L) 08/15/2022 0424   CHOLHDL 4.8 08/15/2022 0424   VLDL 40 08/15/2022 0424   LDLCALC 96 08/15/2022 0424   HgbA1C  Lab Results  Component Value Date   HGBA1C 6.3 (H) 08/15/2022   Urinalysis No results found for: "COLORURINE", "APPEARANCEUR", "LABSPEC", "PHURINE", "GLUCOSEU", "HGBUR", "BILIRUBINUR", "KETONESUR", "PROTEINUR", "UROBILINOGEN", "NITRITE", "LEUKOCYTESUR" Urine Drug Screen     Component Value Date/Time   LABOPIA NONE DETECTED 08/16/2022 0718   COCAINSCRNUR NONE DETECTED 08/16/2022 0718   LABBENZ NONE DETECTED 08/16/2022 0718   AMPHETMU NONE DETECTED 08/16/2022 0718   THCU NONE DETECTED 08/16/2022 0718   LABBARB NONE DETECTED 08/16/2022 0718    Alcohol Level    Component Value Date/Time   ETH <10 08/14/2022 2216     SIGNIFICANT DIAGNOSTIC STUDIES EP STUDY  Result Date: 08/18/2022 See surgical note for result.  VAS [place] LOWER EXTREMITY VENOUS (DVT)  Result Date: 08/17/2022  Lower Venous DVT Study Patient Name:  [name]  Date of Exam:   08/17/2022 Medical Rec #: [id number]               Accession #:    [id number] Date of Birth: 01-28-1966              Patient Gender: M Patient Age:   57 years Exam Location:  [facility] Procedure:      VAS [place] LOWER EXTREMITY VENOUS (DVT) Referring Phys: Hetty Blend --------------------------------------------------------------------------------  Indications: Stroke. Positive TCD bubble study.  Comparison Study: No prior studies. Performing Technologist: Jean Rosenthal RDMS, RVT  Examination Guidelines: A complete evaluation includes B-mode imaging, spectral Doppler, color Doppler, and power Doppler as needed of all accessible portions of each vessel. Bilateral testing is considered an integral part of a complete examination. Limited examinations for reoccurring indications may be performed as noted. The reflux  portion of the exam is performed with the patient in reverse Trendelenburg.  +---------+---------------+---------+-----------+----------+--------------+ RIGHT    CompressibilityPhasicitySpontaneityPropertiesThrombus Aging +---------+---------------+---------+-----------+----------+--------------+ CFV      Full           Yes      Yes                                 +---------+---------------+---------+-----------+----------+--------------+ SFJ      Full                                                        +---------+---------------+---------+-----------+----------+--------------+  FV Prox  Full                                                        +---------+---------------+---------+-----------+----------+--------------+ FV Mid   Full                                                        +---------+---------------+---------+-----------+----------+--------------+ FV DistalFull                                                        +---------+---------------+---------+-----------+----------+--------------+ PFV      Full                                                        +---------+---------------+---------+-----------+----------+--------------+ POP      Full           Yes      Yes                                 +---------+---------------+---------+-----------+----------+--------------+ PTV      Full                                                        +---------+---------------+---------+-----------+----------+--------------+ PERO     Full                                                        +---------+---------------+---------+-----------+----------+--------------+   +---------+---------------+---------+-----------+----------+--------------+ LEFT     CompressibilityPhasicitySpontaneityPropertiesThrombus Aging +---------+---------------+---------+-----------+----------+--------------+ CFV      Full           Yes      Yes                                  +---------+---------------+---------+-----------+----------+--------------+ SFJ      Full                                                        +---------+---------------+---------+-----------+----------+--------------+ FV Prox  Full                                                        +---------+---------------+---------+-----------+----------+--------------+  FV Mid   Full                                                        +---------+---------------+---------+-----------+----------+--------------+ FV DistalFull                                                        +---------+---------------+---------+-----------+----------+--------------+ PFV      Full                                                        +---------+---------------+---------+-----------+----------+--------------+ POP      Full           Yes      Yes                                 +---------+---------------+---------+-----------+----------+--------------+ PTV      Full                                                        +---------+---------------+---------+-----------+----------+--------------+ PERO     Full                                                        +---------+---------------+---------+-----------+----------+--------------+     Summary: RIGHT: - There is no evidence of deep vein thrombosis in the lower extremity.  - No cystic structure found in the popliteal fossa.  LEFT: - There is no evidence of deep vein thrombosis in the lower extremity.  - No cystic structure found in the popliteal fossa.  *See table(s) above for measurements and observations. Electronically signed by Sherald Hess MD on 08/17/2022 at 4:25:35 PM.    Final    VAS [place] TRANSCRANIAL DOPPLER W BUBBLES  Result Date: 08/17/2022  Transcranial Doppler with Bubble Patient Name:  [name]  Date of Exam:   08/17/2022 Medical Rec #: [id number]                Accession #:    [id number] Date of Birth: 28-Mar-1966              Patient Gender: M Patient Age:   40 years Exam Location:  [facility] Procedure:      VAS [place] TRANSCRANIAL DOPPLER W BUBBLES Referring Phys: Hetty Blend --------------------------------------------------------------------------------  Indications: Stroke. Limitations: Unable to insonate transtemporal windows Comparison Study: No prior studies. Performing Technologist: Jean Rosenthal RDMS, RVT  Examination Guidelines: A complete evaluation includes B-mode imaging, spectral Doppler, color Doppler, and power Doppler as needed of all accessible portions of each vessel. Bilateral testing is considered an integral part of a  complete examination. Limited examinations for reoccurring indications may be performed as noted.   Summary:  A vascular evaluation was performed. The left vertebral and carotid siphon was studied. An IV was inserted into the patient's right forearm. Verbal informed consent was obtained.  A full curtain of high intensity transient signals (HITS) was observed with valsalva, indicating a Spencer Grade 5 patent foramen ovale (PFO). *See table(s) above for TCD measurements and observations.    Preliminary    DG Abd Portable 1V  Result Date: 08/15/2022 CLINICAL DATA:  Enteric catheter placement EXAM: PORTABLE ABDOMEN - 1 VIEW COMPARISON:  None Available. FINDINGS: Frontal view of the lower chest and upper abdomen demonstrates enteric catheter passing below diaphragm, tip and side port projecting over the gastric fundus. Visualized bowel gas is unremarkable. Lung bases are clear. IMPRESSION: 1. Enteric catheter tip projecting over the gastric fundus. Electronically Signed   By: Sharlet Salina M.D.   On: 08/15/2022 16:39   MR BRAIN WO CONTRAST  Result Date: 08/15/2022 CLINICAL DATA:  Stroke, hemorrhagic. EXAM: MRI HEAD WITHOUT CONTRAST TECHNIQUE: Multiplanar, multiecho pulse sequences of the brain and surrounding structures were  obtained without intravenous contrast. COMPARISON:  CTA head/neck 08/14/2022. FINDINGS: Brain: Acute cortical infarcts along the left frontal operculum and left anterior insula with additional small area of acute cortical infarct within left parietal lobe. Other areas of faint cortical DWI hyperintensity throughout the left MCA territory, including the left putamen, with corresponding decreased signal on ADC may reflect additional areas of infarct or ischemia. Moderate chronic small-vessel disease. No hydrocephalus or extra-axial collection. No foci of abnormal susceptibility. Vascular: Loss of the left transverse sinus flow void is favored to reflect slow flow. Skull and upper cervical spine: Normal marrow signal. Sinuses/Orbits: Unremarkable. Other: None. IMPRESSION: 1. Acute cortical infarcts along the left frontal operculum and left anterior insula with additional small area of acute cortical infarct within left parietal lobe. Other areas of faint cortical DWI hyperintensity throughout the left MCA territory, including the left putamen, may reflect additional areas of infarct or ischemia. 2. Loss of the left transverse sinus flow void is favored to reflect slow flow. If clinical suspicion for thrombosis, CT venography could be performed for further characterization. Electronically Signed   By: Orvan Falconer M.D.   On: 08/15/2022 13:50   IR US Guide Vasc Access Right  Result Date: 08/15/2022 INDICATION: 57 year old male presenting [facility] with right-sided weakness and aphasia; NIHSS 20. His last known well was 9 p.m. on 08/14/2022. Past medical history significant for CKD 4, hypertension, type 2 diabetes, hypertension, hyperlipidemia, gout; modified Rankin scale 0. Head CT showed early ischemic changes in the left frontal operculum, insula and temporal lobe (ASPECTS 7) and CT angiogram of the head and neck showed occlusion of the left M1/MCA segment with 111 mL of penumbra. He received intravenous TNK and was  then transferred to [facility] for mechanical thrombectomy. At arrival, improvement of his NIHSS was 10. EXAM: ULTRASOUND-GUIDED VASCULAR ACCESS DIAGNOSTIC CEREBRAL ANGIOGRAM COMPARISON:  CT/CT angiogram head neck August 14, 2022. MEDICATIONS: No antibiotic was administered within 1 hour of the procedure. ANESTHESIA/SEDATION: The procedure was performed under general anesthesia. CONTRAST:  25 mL of Omnipaque 300 milligram/mL. FLUOROSCOPY: Radiation Exposure Index (as provided by the fluoroscopic device): 218 mGy Kerma COMPLICATIONS: None immediate. TECHNIQUE: Informed written consent was obtained from the patient's wife after a thorough discussion of the procedural risks, benefits and alternatives. All questions were addressed. Maximal Sterile Barrier Technique was utilized including caps, mask, sterile gowns,  sterile gloves, sterile drape, hand hygiene and skin antiseptic. A timeout was performed prior to the initiation of the procedure. The right groin was prepped and draped in the usual sterile fashion. Using a micropuncture kit and the modified Seldinger technique, access was gained to the right common femoral artery and an 8 French sheath was placed. Real-time ultrasound guidance was utilized for vascular access including the acquisition of a permanent ultrasound image documenting patency of the accessed vessel. Under fluoroscopy, an 8 [place] Walrus balloon guide catheter was navigated over a 6 [place] Berenstein 2 catheter and a 0.035" Terumo Glidewire into the aortic arch. The catheter was placed into the left common carotid artery and then advanced into the left internal carotid artery. The diagnostic catheter was removed. Frontal and lateral angiograms of the head were obtained followed by magnified left anterior oblique and magnified Schuller's view. The catheter was subsequently withdrawn. Right common femoral artery angiogram was obtained in right anterior oblique view. The puncture is at the level of the  common femoral artery. The artery has normal caliber, adequate for closure device. The sheath was exchanged over the wire for a Perclose prostyle which was utilized for access closure. Immediate hemostasis was achieved. FINDINGS: Left ICA angiograms: There is brisk vascular contrast filling of the left ICA, left ACA and MCA vascular trees without evidence of occlusion, stenosis or filling defect. Luminal caliber is smooth and tapering. No aneurysms or abnormally high-flow, early draining veins are seen. No regions of abnormal hypervascularity are noted. The visualized dural sinuses are patent. Right common femoral artery angiograms: The access is at the level of the mid right common femoral artery. The femoral artery has normal caliber, adequate for closure device. PROCEDURE: No intervention performed. IMPRESSION: Interval recanalization of the left M1/MCA occlusion seen on prior CT angiogram likely as a result of TNK administration. Therefore, no intervention performed. PLAN: Transfer patient to ICU under mechanical ventilation due to angioedema. Electronically Signed   By: Baldemar Lenis M.D.   On: 08/15/2022 13:35   IR ANGIO INTRA EXTRACRAN SEL INTERNAL CAROTID UNI L MOD SED  Result Date: 08/15/2022 INDICATION: 57 year old male presenting [facility] with right-sided weakness and aphasia; NIHSS 20. His last known well was 9 p.m. on 08/14/2022. Past medical history significant for CKD 4, hypertension, type 2 diabetes, hypertension, hyperlipidemia, gout; modified Rankin scale 0. Head CT showed early ischemic changes in the left frontal operculum, insula and temporal lobe (ASPECTS 7) and CT angiogram of the head and neck showed occlusion of the left M1/MCA segment with 111 mL of penumbra. He received intravenous TNK and was then transferred to [facility] for mechanical thrombectomy. At arrival, improvement of his NIHSS was 10. EXAM: ULTRASOUND-GUIDED VASCULAR ACCESS DIAGNOSTIC CEREBRAL ANGIOGRAM  COMPARISON:  CT/CT angiogram head neck August 14, 2022. MEDICATIONS: No antibiotic was administered within 1 hour of the procedure. ANESTHESIA/SEDATION: The procedure was performed under general anesthesia. CONTRAST:  25 mL of Omnipaque 300 milligram/mL. FLUOROSCOPY: Radiation Exposure Index (as provided by the fluoroscopic device): 218 mGy Kerma COMPLICATIONS: None immediate. TECHNIQUE: Informed written consent was obtained from the patient's wife after a thorough discussion of the procedural risks, benefits and alternatives. All questions were addressed. Maximal Sterile Barrier Technique was utilized including caps, mask, sterile gowns, sterile gloves, sterile drape, hand hygiene and skin antiseptic. A timeout was performed prior to the initiation of the procedure. The right groin was prepped and draped in the usual sterile fashion. Using a micropuncture kit and the modified Seldinger technique,  access was gained to the right common femoral artery and an 8 French sheath was placed. Real-time ultrasound guidance was utilized for vascular access including the acquisition of a permanent ultrasound image documenting patency of the accessed vessel. Under fluoroscopy, an 8 [place] Walrus balloon guide catheter was navigated over a 6 [place] Berenstein 2 catheter and a 0.035" Terumo Glidewire into the aortic arch. The catheter was placed into the left common carotid artery and then advanced into the left internal carotid artery. The diagnostic catheter was removed. Frontal and lateral angiograms of the head were obtained followed by magnified left anterior oblique and magnified Schuller's view. The catheter was subsequently withdrawn. Right common femoral artery angiogram was obtained in right anterior oblique view. The puncture is at the level of the common femoral artery. The artery has normal caliber, adequate for closure device. The sheath was exchanged over the wire for a Perclose prostyle which was utilized for access  closure. Immediate hemostasis was achieved. FINDINGS: Left ICA angiograms: There is brisk vascular contrast filling of the left ICA, left ACA and MCA vascular trees without evidence of occlusion, stenosis or filling defect. Luminal caliber is smooth and tapering. No aneurysms or abnormally high-flow, early draining veins are seen. No regions of abnormal hypervascularity are noted. The visualized dural sinuses are patent. Right common femoral artery angiograms: The access is at the level of the mid right common femoral artery. The femoral artery has normal caliber, adequate for closure device. PROCEDURE: No intervention performed. IMPRESSION: Interval recanalization of the left M1/MCA occlusion seen on prior CT angiogram likely as a result of TNK administration. Therefore, no intervention performed. PLAN: Transfer patient to ICU under mechanical ventilation due to angioedema. Electronically Signed   By: Baldemar Lenis M.D.   On: 08/15/2022 13:35   ECHOCARDIOGRAM COMPLETE  Result Date: 08/15/2022    ECHOCARDIOGRAM REPORT   Patient Name:   [name] Date of Exam: 08/15/2022 Medical Rec #:  [id number]              Height:       72.0 in Accession #:    [id number]             Weight:       216.0 lb Date of Birth:  [birth year]             BSA:          2.202 m Patient Age:    56 years               BP:           105/79 mmHg Patient Gender: M                      HR:           79 bpm. Exam Location:  Inpatient Procedure: 2D Echo, Cardiac Doppler and Color Doppler Indications:    Stroke I63.9  History:        Patient has no prior history of Echocardiogram examinations.                 Stroke; Risk Factors:Hypertension.  Sonographer:    Lucendia Herrlich Referring Phys: [id number] ASHISH ARORA IMPRESSIONS  1. Left ventricular ejection fraction, by estimation, is 60 to 65%. The left ventricle has normal function. The left ventricle has no regional wall motion abnormalities. Left ventricular diastolic  parameters were normal.  2. Right ventricular systolic function is normal. The right ventricular size  is normal.  3. The mitral valve is normal in structure. Trivial mitral valve regurgitation. No evidence of mitral stenosis.  4. The aortic valve is normal in structure. Aortic valve regurgitation is not visualized. No aortic stenosis is present.  5. The inferior vena cava is normal in size with greater than 50% respiratory variability, suggesting right atrial pressure of 3 mmHg.  6. Technically limited study due to poor sound wave transmission. FINDINGS  Left Ventricle: Left ventricular ejection fraction, by estimation, is 60 to 65%. The left ventricle has normal function. The left ventricle has no regional wall motion abnormalities. The left ventricular internal cavity size was normal in size. There is  no left ventricular hypertrophy. Left ventricular diastolic parameters were normal. Right Ventricle: The right ventricular size is normal. No increase in right ventricular wall thickness. Right ventricular systolic function is normal. Left Atrium: Left atrial size was normal in size. Right Atrium: Right atrial size was normal in size. Pericardium: There is no evidence of pericardial effusion. Mitral Valve: The mitral valve is normal in structure. There is mild calcification of the mitral valve leaflet(s). Trivial mitral valve regurgitation. No evidence of mitral valve stenosis. Tricuspid Valve: The tricuspid valve is normal in structure. Tricuspid valve regurgitation is not demonstrated. No evidence of tricuspid stenosis. Aortic Valve: The aortic valve is normal in structure. Aortic valve regurgitation is not visualized. No aortic stenosis is present. Aortic valve peak gradient measures 3.1 mmHg. Pulmonic Valve: The pulmonic valve was normal in structure. Pulmonic valve regurgitation is not visualized. No evidence of pulmonic stenosis. Aorta: The aortic root is normal in size and structure. Venous: The inferior vena  cava is normal in size with greater than 50% respiratory variability, suggesting right atrial pressure of 3 mmHg. IAS/Shunts: No atrial level shunt detected by color flow Doppler.  LEFT VENTRICLE PLAX 2D LVIDd:         4.30 cm Diastology LVIDs:         3.20 cm LV e' medial:    4.29 cm/s LV PW:         0.80 cm LV E/e' medial:  11.7 LV IVS:        1.00 cm LV e' lateral:   7.36 cm/s                        LV E/e' lateral: 6.8  RIGHT VENTRICLE             IVC RV S prime:     11.10 cm/s  IVC diam: 1.70 cm TAPSE (M-mode): 1.3 cm LEFT ATRIUM           Index       RIGHT ATRIUM          Index LA diam:      2.80 cm 1.27 cm/m  RA Area:     9.72 cm LA Vol (A2C): 16.4 ml 7.45 ml/m  RA Volume:   16.00 ml 7.27 ml/m  AORTIC VALVE AV Vmax:      88.00 cm/s AV Peak Grad: 3.1 mmHg LVOT Vmax:    71.50 cm/s LVOT Vmean:   45.767 cm/s LVOT VTI:     0.120 m MITRAL VALVE MV Area (PHT): 3.77 cm    SHUNTS MV Decel Time: 201 msec    Systemic VTI: 0.12 m MV E velocity: 50.30 cm/s MV A velocity: 65.10 cm/s MV E/A ratio:  0.77 Arvilla Meres MD Electronically signed by Arvilla Meres MD Signature Date/Time: 08/15/2022/11:36:00 AM  Final    CT ANGIO HEAD NECK W WO CM W PERF (CODE STROKE)  Addendum Date: 08/15/2022   ADDENDUM REPORT: 08/15/2022 00:55 ADDENDUM: CT Brain Perfusion Findings: CBF (<30%) Volume: 43mL Perfusion (Tmax>6.0s) volume: Mismatch Volume: Infarction Location:Left MCA territory Electronically Signed   By: Deatra Robinson M.D.   On: 08/15/2022 00:55   Result Date: 08/15/2022 CLINICAL DATA:  Right-sided weakness EXAM: CT ANGIOGRAPHY HEAD AND NECK TECHNIQUE: Multidetector CT imaging of the head and neck was performed using the standard protocol during bolus administration of intravenous contrast. Multiplanar CT image reconstructions and MIPs were obtained to evaluate the vascular anatomy. Carotid stenosis measurements (when applicable) are obtained utilizing NASCET criteria, using the distal internal carotid  diameter as the denominator. RADIATION DOSE REDUCTION: This exam was performed according to the departmental dose-optimization program which includes automated exposure control, adjustment of the mA and/or kV according to patient size and/or use of iterative reconstruction technique. CONTRAST:  OMNIPAQUE IOHEXOL 350 MG/ML SOLN COMPARISON:  None Available. FINDINGS: CTA NECK FINDINGS SKELETON: There is no bony spinal canal stenosis. No lytic or blastic lesion. OTHER NECK: Normal pharynx, larynx and major salivary glands. No cervical lymphadenopathy. Unremarkable thyroid gland. UPPER CHEST: No pneumothorax or pleural effusion. No nodules or masses. AORTIC ARCH: There is no calcific atherosclerosis of the aortic arch. There is no aneurysm, dissection or hemodynamically significant stenosis of the visualized portion of the aorta. Conventional 3 vessel aortic branching pattern. The visualized proximal subclavian arteries are widely patent. RIGHT CAROTID SYSTEM: Normal without aneurysm, dissection or stenosis. LEFT CAROTID SYSTEM: Normal without aneurysm, dissection or stenosis. VERTEBRAL ARTERIES: Left dominant configuration. Both origins are clearly patent. There is no dissection, occlusion or flow-limiting stenosis to the skull base (V1-V3 segments). CTA HEAD FINDINGS POSTERIOR CIRCULATION: --Vertebral arteries: Normal V4 segments. --Inferior cerebellar arteries: Normal. --Basilar artery: Normal. --Superior cerebellar arteries: Normal. --Posterior cerebral arteries (PCA): Normal. ANTERIOR CIRCULATION: --Intracranial internal carotid arteries: Normal. --Anterior cerebral arteries (ACA): Normal. Both A1 segments are present. Patent anterior communicating artery (a-comm). --Middle cerebral arteries (MCA): Proximal occlusion of the left MCA M1 segment with poor collateralization throughout the MCA territory. VENOUS SINUSES: As permitted by contrast timing, patent. ANATOMIC VARIANTS: Fetal origins of both posterior  cerebral arteries. Review of the MIP images confirms the above findings. IMPRESSION: Proximal occlusion of the left MCA M1 segment with poor collateralization throughout the MCA territory. Critical Value/emergent results were called by telephone at the time of interpretation on 08/14/2022 at 10:47 pm to provider QUALE , who verbally acknowledged these results. Electronically Signed: By: Deatra Robinson M.D. On: 08/14/2022 22:47   CT HEAD CODE STROKE WO CONTRAST  Result Date: 08/14/2022 CLINICAL DATA:  Code stroke.  Right-sided neglect EXAM: CT HEAD WITHOUT CONTRAST TECHNIQUE: Contiguous axial images were obtained from the base of the skull through the vertex without intravenous contrast. RADIATION DOSE REDUCTION: This exam was performed according to the departmental dose-optimization program which includes automated exposure control, adjustment of the mA and/or kV according to patient size and/or use of iterative reconstruction technique. COMPARISON:  None Available. FINDINGS: Brain: There is no mass, hemorrhage or extra-axial collection. The size and configuration of the ventricles and extra-axial CSF spaces are normal. Decreased gray-white differentiation at the left insular cortex. Vascular: Mild hyperdensity of the left MCA. Skull: The visualized skull base, calvarium and extracranial soft tissues are normal. Sinuses/Orbits: No fluid levels or advanced mucosal thickening of the visualized paranasal sinuses. No mastoid or middle ear effusion. The orbits are  normal. ASPECTS (Alberta Stroke Program Early CT Score) - Ganglionic level infarction (caudate, lentiform nuclei, internal capsule, insula, M1-M3 cortex): 6 (left insula) - Supraganglionic infarction (M4-M6 cortex): 3 Total score (0-10 with 10 being normal): 9 IMPRESSION: 1. Decreased gray-white differentiation at the left insular cortex, concerning for acute/subacute infarct. ASPECTS is 9. 2. Mild hyperdensity of the left MCA could indicate thrombus.  Recommend CTA. 3. No acute hemorrhage These results were called by telephone at the time of interpretation on 08/14/2022 at 10:07 pm to provider MARK QUALE , who verbally acknowledged these results. Electronically Signed   By: Deatra Robinson M.D.   On: 08/14/2022 22:09       HISTORY OF PRESENT ILLNESS Mr. Beckett Maden is a 57 y.o. male with history of CKD 4, on kidney transplant list, hypertension, DM 2, hyperlipidemia BIB EMS 4/21 due to sudden onset of confusion, inability to talk and weakness on the right side.  Was 2100.  TNK was admitted admitted ministered at outside hospital and patient was transferred to [facility] for IR procedure.  On first run of the arteriogram it appeared that the left M1 occlusion had spontaneously recanalized, likely secondary to TNK and no thrombectomy was required.  Patient remained intubated postprocedure due to angioedema, possibly due to reaction from TNK.    HOSPITAL COURSE Left acute frontal, left anterior insula acute cortical infarcts Small acute cortical small acute left parietal cortical infarct Left M1 Proximal Occlusion with TNK revascularization Etiology: Cryptogenic.   Code Stroke CT head  Decreased gray-white differentiation at the left insular cortex, concerning for acute/subacute infarct. ASPECTS on read is noted as 9, but on review this looks to be more like 7. Mild hyperdensity of the left MCA could indicate thrombus. No acute hemorrhage CTA head & neck/Perfusion Proximal occlusion of the left MCA M1 segment with poor collateralization throughout the MCA territory. Left MCA infarction. Mismatch volume: . MRI: Acute cortical infarcts along the left frontal operculum and left anterior insula with additional small area of acute cortical infarct within left parietal lobe. Other areas of faint cortical DWI hyperintensity throughout the left MCA territory, including the left putamen, may reflect additional areas of infarct or ischemia.    2D Echo: EF 60 to 65%, trivial MVR. TEE scheduled small PFO.  No other cardiac source of embolism  TCD [place] w/bubbles: Positive for PFO  LE DVT US: Negative Loop recorder to be placed prior to rehab LDL 96 HgbA1c 6.3 VTE prophylaxis - heparin SQ  aspirin 81 mg daily prior to admission, now on aspirin 81mg  and clopidogrel daily.  Recommend aspirin and Plavix for 3 weeks then Plavix alone. (Patient previously on aspirin at home before stroke) Therapy recommendations: AIR Disposition:  pending   Acute Respiratory Failure, post-procedure requiring mechanical ventiliation Angioedema s/p TNK Extubated 4/23 AM Improved swelling Patient on lisinopril at home with no history of angioedema   Hypertension Home meds: Amlodipine 10 mg, carvedilol 25 mg, lisinopril 10 mg Coreg 25mg  reordered. Stable SBP goal <180, avoid hypotension.  Long-term BP goal normotensive   Hyperlipidemia Home meds:  none LDL 96, goal < 70 Add Lipitor 20mg   Continue statin at discharge   Diabetes type II Home meds:  none HgbA1c 6.3, goal < 7.0 CBGs Recent Labs (last 2 labs)       Recent Labs    08/16/22 1546 08/16/22 2216 08/17/22 0749  GLUCAP 137* 139* 121*       SSI Recommend close follow-up with PCP as patient is  close to the DM diagnosis level.    Other Stroke Risk Factors Obesity, BMI: 29.30 BMI >/= 30 associated with increased stroke risk, recommend weight loss, diet and exercise as appropriate  OSA Pending sleep study consent (wife)   Other Active Problems CKD 4 No dialysis currently, on transplant list per wife Follows with [facility]    DISCHARGE EXAM Blood pressure (!) 121/99, pulse 72, temperature 98 F (36.7 C), temperature source Temporal, resp. rate 18, SpO2 96 %.  PHYSICAL EXAM Patient awake and alert. Global aphasia.Intermittently follows very simple commands, but consistently imitates/mimics. EOMI, tracks examiner. Symmetric facial movement. Spontaneous movement of all  extremities with no drift present. No ataxia or tremors seen. FTN intact bilaterally.     Discharge Diet      There are no active orders of the following types: Diet, Nourishments.   liquids  DISCHARGE PLAN Disposition:  Transfer to [facility] for ongoing PT, OT and ST Aspirin 81 mg daily and Plavix 75 mg daily for secondary stroke prevention for 3 weeks then aspirin 81 mg daily alone. Recommend ongoing stroke risk factor control by Primary Care Physician at time of discharge from inpatient rehabilitation. Follow-up PCP Gauger, Hermenia Fiscal, NP in 2 weeks following discharge from rehab. Follow-up in [facility] with Dr. Caffie Damme in 8 weeks following discharge from rehab, office to schedule an appointment.  Outpatient follow-up with Dr. Excell Seltzer interventional cardiologist for elective PFO closure  50 minutes were spent preparing discharge  Gevena Mart DNP, ACNPC-AG  [facility]  I have personally obtained history,examined this patient, reviewed notes, independently viewed imaging studies, participated in medical decision making and plan of care.ROS completed by me personally and pertinent positives fully documented  I have made any additions or clarifications directly to the above note. Agree with note above.    Delia Heady, MD Medical Director [facility] Pager: [phone] 08/18/2022 2:35 PM

## 2022-08-18 NOTE — Consult Note (Addendum)
ELECTROPHYSIOLOGY CONSULT NOTE  Patient ID: [name] MRN: [id number], DOB/AGE: Jun 23, 1965   Admit date: 08/14/2022 Date of Consult: 08/18/2022  Primary Physician: Myrene Buddy, NP Primary Cardiologist: None  Primary Electrophysiologist: New to Dr. Elberta Fortis Reason for Consultation: Cryptogenic stroke; recommendations regarding Implantable Loop Recorder Insurance: BC[employer]  History of Present Illness EP has been asked to evaluate [name] Age for placement of an implantable loop recorder to monitor for atrial fibrillation by Dr Pearlean Brownie.  The patient was admitted on 08/14/2022 with sudden confusion, R sided weakness, and aphasia.    TNK was administered at outside hospital and transferred to [facility] for IR procedure. On first run of the arteriogram it appeared that the left M1 occlusion had spontaneously recanalized, likely secondary to TNK and no thrombectomy was required. Patient remained intubated postprocedure due to angioedema, possibly due to reaction from TNK.   Imaging demonstrated Left acute frontal, left anterior insula acute cortical infarcts ;  Small acute cortical small acute left parietal cortical infarct ; Left M1 Proximal Occlusion with TNK revascularization  He has undergone workup for stroke including:  Code Stroke CT head  Decreased gray-white differentiation at the left insular cortex, concerning for acute/subacute infarct. ASPECTS on read is noted as 9, but on review this looks to be more like 7. Mild hyperdensity of the left MCA could indicate thrombus. No acute hemorrhage CTA head & neck/Perfusion Proximal occlusion of the left MCA M1 segment with poor collateralization throughout the MCA territory. Left MCA infarction. Mismatch volume: . MRI: Acute cortical infarcts along the left frontal operculum and left anterior insula with additional small area of acute cortical infarct within left parietal lobe. Other areas of faint cortical DWI  hyperintensity throughout the left MCA territory, including the left putamen, may reflect additional areas of infarct or ischemia.   2D Echo: EF 60 to 65%, trivial MVR. TEE scheduled for 4/25 TCD [place] w/bubbles: pending LE DVT US: pending Possible Loop Recorder at discharge LDL 96 HgbA1c 6.3 VTE prophylaxis - heparin SQ aspirin 81 mg daily prior to admission, now on aspirin  and clopidogrel daily.  Recommend aspirin and Plavix for 3 weeks then Plavix alone. (Patient previously on aspirin at home before stroke) Therapy recommendations: AIR Disposition:  CIR today.   The patient has been monitored on telemetry which has demonstrated sinus rhythm with no arrhythmias.  Inpatient stroke work-up Tokiko Diefenderfer require a TEE per Neurology.   Echocardiogram as above. Lab work is reviewed.  Prior to admission, the patient denies chest pain, shortness of breath, dizziness, palpitations, or syncope.  He is recovering from his stroke with plans to attend CIR  at discharge.  Allergies, Past Medical, Surgical, Social, and Family Histories have been reviewed and are referenced here-in when relevant for medical decision making.   Inpatient Medications:   aspirin  81 mg Oral Daily   Or   aspirin  300 mg Rectal Daily   atorvastatin  20 mg Oral Daily   carvedilol  12.5 mg Oral BID   Chlorhexidine Gluconate Cloth  6 each Topical Q0600   clopidogrel  75 mg Oral Daily   heparin injection (subcutaneous)  5,000 Units Subcutaneous Q8H   insulin aspart  0-9 Units Subcutaneous TID WC & HS    Physical Exam: Vitals:   08/17/22 2058 08/18/22 0027 08/18/22 0412 08/18/22 0900  BP: (!) 153/95 (!) 125/92 (!) 144/97 (!) 133/102  Pulse: 83 66 61 75  Resp: Temp: 98.9 F (37.2  C) 98.4 F (36.9 C) 98 F (36.7 C) 98.3 F (36.8 C)  TempSrc: Oral Oral Oral Oral  SpO2: 99% 100% 99% 100%    GEN- NAD. A&O x 3. Normal affect. HEENT: Normocephalic, atraumatic Lungs- CTAB, Normal effort.  Heart- Regular  rate and rhythm rate and rhythm. No M/G/R.  Extremities- No peripheral edema. no clubbing or cyanosis Skin- warm and dry, no rash or lesion. Neuro - Global aphasia   12-lead ECG on arrival showed NSR (personally reviewed) No prior EKG's in EPIC. No reported h/o AF  Telemetry NSR 60-80s (personally reviewed)  Assessment and Plan:  Cryptogenic stroke EF normal DVT US negative; would plan loop even if PFO present, which TCD was suggestive of.  The patient presents with cryptogenic stroke.  The patient does have a TEE planned for this AM.  I spoke at length with the patient about monitoring for afib with an implantable loop recorder.  Risks, benefits, and alteratives to implantable loop recorder were discussed with the patient today.   At this time, the patient is very clear in their decision to proceed with implantable loop recorder.   CKD IV On kidney transplant list per chart.  Wound care was reviewed with the patient (keep incision clean and dry for 3 days). Please call with questions.   [name], PA-C 08/18/2022 11:16 AM  I have seen and examined this patient with [name].  Agree with above, note added to reflect my findings.  On exam, RRR, no murmurs, lungs clear.  Patient presented to the hospital with cryptogenic stroke. To date, no cause has been found. TEE negative for thrombus. [name] plan for LINQ monitor to look for atrial fibrillation. Risks and benefits discussed. Risks include but not limited to bleeding and infection. The patient understands the risks and has agreed to the procedure.  [name] MD 08/18/2022 2:49 PM

## 2022-08-18 NOTE — Interval H&P Note (Signed)
History and Physical Interval Note:  08/18/2022 11:30 AM  [name]  has presented today for surgery, with the diagnosis of STROKE.  The various methods of treatment have been discussed with the patient and family. After consideration of risks, benefits and other options for treatment, the patient has consented to  Procedure(s): TRANSESOPHAGEAL ECHOCARDIOGRAM (N/A) as a surgical intervention.  The patient's history has been reviewed, patient examined, no change in status, stable for surgery.  I have reviewed the patient's chart and labs.  Questions were answered to the patient's satisfaction.     [name]

## 2022-08-18 NOTE — Transfer of Care (Signed)
Immediate Anesthesia Transfer of Care Note  Patient: [name]  Procedure(s) Performed: TRANSESOPHAGEAL ECHOCARDIOGRAM  Patient Location: PACU and Cath Lab  Anesthesia Type:MAC  Level of Consciousness: drowsy  Airway & Oxygen Therapy: Patient Spontanous Breathing and Patient connected to nasal cannula oxygen  Post-op Assessment: Report given to RN and Post -op Vital signs reviewed and stable  Post vital signs: Reviewed and stable  Last Vitals:  Vitals Value Taken Time  BP    Temp    Pulse    Resp    SpO2      Last Pain:  Vitals:   08/18/22 0900  TempSrc: Oral  PainSc: 0-No pain      Patients Stated Pain Goal: 0 (08/18/22 0900)  Complications: No notable events documented.

## 2022-08-18 NOTE — Significant Event (Signed)
Patient is discharged to inpatient rehab, room 10. Ambulated there with spouse and NT. All personal belongings taken with patient's spouse. Report given to receiving RN.

## 2022-08-18 NOTE — Plan of Care (Signed)
Problem: Education: Goal: Knowledge of disease or condition will improve 08/18/2022 0131 by Aretta Nip, RN Outcome: Progressing 08/18/2022 0130 by Aretta Nip, RN Outcome: Not Progressing Goal: Knowledge of secondary prevention will improve (MUST DOCUMENT ALL) 08/18/2022 0131 by Aretta Nip, RN Outcome: Progressing 08/18/2022 0130 by Aretta Nip, RN Outcome: Not Progressing Goal: Knowledge of patient specific risk factors will improve Loraine Leriche N/A or DELETE if not current risk factor) 08/18/2022 0131 by Aretta Nip, RN Outcome: Progressing 08/18/2022 0130 by Aretta Nip, RN Outcome: Not Progressing   Problem: Ischemic Stroke/TIA Tissue Perfusion: Goal: Complications of ischemic stroke/TIA will be minimized 08/18/2022 0131 by Aretta Nip, RN Outcome: Progressing 08/18/2022 0130 by Aretta Nip, RN Outcome: Not Progressing   Problem: Coping: Goal: Will verbalize positive feelings about self 08/18/2022 0131 by Aretta Nip, RN Outcome: Progressing 08/18/2022 0130 by Aretta Nip, RN Outcome: Not Progressing Goal: Will identify appropriate support needs 08/18/2022 0131 by Aretta Nip, RN Outcome: Progressing 08/18/2022 0130 by Aretta Nip, RN Outcome: Not Progressing   Problem: Health Behavior/Discharge Planning: Goal: Ability to manage health-related needs will improve 08/18/2022 0131 by Aretta Nip, RN Outcome: Progressing 08/18/2022 0130 by Aretta Nip, RN Outcome: Not Progressing Goal: Goals will be collaboratively established with patient/family 08/18/2022 0131 by Aretta Nip, RN Outcome: Progressing 08/18/2022 0130 by Aretta Nip, RN Outcome: Not Progressing   Problem: Self-Care: Goal: Ability to participate in self-care as condition permits will improve 08/18/2022 0131 by Aretta Nip, RN Outcome: Progressing 08/18/2022 0130 by Aretta Nip, RN Outcome: Not Progressing Goal: Verbalization of feelings and  concerns over difficulty with self-care will improve 08/18/2022 0131 by Aretta Nip, RN Outcome: Progressing 08/18/2022 0130 by Aretta Nip, RN Outcome: Not Progressing Goal: Ability to communicate needs accurately will improve 08/18/2022 0131 by Aretta Nip, RN Outcome: Progressing 08/18/2022 0130 by Aretta Nip, RN Outcome: Not Progressing   Problem: Nutrition: Goal: Risk of aspiration will decrease 08/18/2022 0131 by Aretta Nip, RN Outcome: Progressing 08/18/2022 0130 by Aretta Nip, RN Outcome: Not Progressing Goal: Dietary intake will improve 08/18/2022 0131 by Aretta Nip, RN Outcome: Progressing 08/18/2022 0130 by Aretta Nip, RN Outcome: Not Progressing   Problem: Education: Goal: Understanding of CV disease, CV risk reduction, and recovery process will improve 08/18/2022 0131 by Aretta Nip, RN Outcome: Progressing 08/18/2022 0130 by Aretta Nip, RN Outcome: Not Progressing Goal: Individualized Educational Video(s) 08/18/2022 0131 by Aretta Nip, RN Outcome: Progressing 08/18/2022 0130 by Aretta Nip, RN Outcome: Not Progressing   Problem: Cardiovascular: Goal: Ability to achieve and maintain adequate cardiovascular perfusion will improve 08/18/2022 0131 by Aretta Nip, RN Outcome: Progressing 08/18/2022 0130 by Aretta Nip, RN Outcome: Not Progressing Goal: Vascular access site(s) Level 0-1 will be maintained 08/18/2022 0131 by Aretta Nip, RN Outcome: Progressing 08/18/2022 0130 by Aretta Nip, RN Outcome: Not Progressing   Problem: Activity: Goal: Ability to return to baseline activity level will improve 08/18/2022 0131 by Aretta Nip, RN Outcome: Progressing 08/18/2022 0130 by Aretta Nip, RN Outcome: Not Progressing   Problem: Health Behavior/Discharge Planning: Goal: Ability to safely manage health-related needs after discharge will improve 08/18/2022 0131 by Aretta Nip, RN Outcome:  Progressing 08/18/2022 0130 by Aretta Nip, RN Outcome: Not Progressing   Problem: Education: Goal: Ability to describe self-care measures that may prevent or decrease complications (Diabetes Survival Skills  Education) will improve 08/18/2022 0131 by Aretta Nip, RN Outcome: Progressing 08/18/2022 0130 by Aretta Nip, RN Outcome: Not Progressing Goal: Individualized Educational Video(s) 08/18/2022 0131 by Aretta Nip, RN Outcome: Progressing 08/18/2022 0130 by Aretta Nip, RN Outcome: Not Progressing   Problem: Coping: Goal: Ability to adjust to condition or change in health will improve 08/18/2022 0131 by Aretta Nip, RN Outcome: Progressing 08/18/2022 0130 by Aretta Nip, RN Outcome: Not Progressing   Problem: Fluid Volume: Goal: Ability to maintain a balanced intake and output will improve 08/18/2022 0131 by Aretta Nip, RN Outcome: Progressing 08/18/2022 0130 by Aretta Nip, RN Outcome: Not Progressing   Problem: Health Behavior/Discharge Planning: Goal: Ability to identify and utilize available resources and services will improve 08/18/2022 0131 by Aretta Nip, RN Outcome: Progressing 08/18/2022 0130 by Aretta Nip, RN Outcome: Not Progressing Goal: Ability to manage health-related needs will improve 08/18/2022 0131 by Aretta Nip, RN Outcome: Progressing 08/18/2022 0130 by Aretta Nip, RN Outcome: Not Progressing   Problem: Metabolic: Goal: Ability to maintain appropriate glucose levels will improve 08/18/2022 0131 by Aretta Nip, RN Outcome: Progressing 08/18/2022 0130 by Aretta Nip, RN Outcome: Not Progressing   Problem: Nutritional: Goal: Maintenance of adequate nutrition will improve 08/18/2022 0131 by Aretta Nip, RN Outcome: Progressing 08/18/2022 0130 by Aretta Nip, RN Outcome: Not Progressing Goal: Progress toward achieving an optimal weight will improve 08/18/2022 0131 by Aretta Nip,  RN Outcome: Progressing 08/18/2022 0130 by Aretta Nip, RN Outcome: Not Progressing   Problem: Skin Integrity: Goal: Risk for impaired skin integrity will decrease 08/18/2022 0131 by Aretta Nip, RN Outcome: Progressing 08/18/2022 0130 by Aretta Nip, RN Outcome: Not Progressing   Problem: Tissue Perfusion: Goal: Adequacy of tissue perfusion will improve 08/18/2022 0131 by Aretta Nip, RN Outcome: Progressing 08/18/2022 0130 by Aretta Nip, RN Outcome: Not Progressing   Problem: Education: Goal: Knowledge of General Education information will improve Description: Including pain rating scale, medication(s)/side effects and non-pharmacologic comfort measures 08/18/2022 0131 by Aretta Nip, RN Outcome: Progressing 08/18/2022 0130 by Aretta Nip, RN Outcome: Not Progressing   Problem: Health Behavior/Discharge Planning: Goal: Ability to manage health-related needs will improve 08/18/2022 0131 by Aretta Nip, RN Outcome: Progressing 08/18/2022 0130 by Aretta Nip, RN Outcome: Not Progressing   Problem: Clinical Measurements: Goal: Ability to maintain clinical measurements within normal limits will improve 08/18/2022 0131 by Aretta Nip, RN Outcome: Progressing 08/18/2022 0130 by Aretta Nip, RN Outcome: Not Progressing Goal: Will remain free from infection 08/18/2022 0131 by Aretta Nip, RN Outcome: Progressing 08/18/2022 0130 by Aretta Nip, RN Outcome: Not Progressing Goal: Diagnostic test results will improve 08/18/2022 0131 by Aretta Nip, RN Outcome: Progressing 08/18/2022 0130 by Aretta Nip, RN Outcome: Not Progressing Goal: Respiratory complications will improve 08/18/2022 0131 by Aretta Nip, RN Outcome: Progressing 08/18/2022 0130 by Aretta Nip, RN Outcome: Not Progressing Goal: Cardiovascular complication will be avoided 08/18/2022 0131 by Aretta Nip, RN Outcome: Progressing 08/18/2022 0130 by  Aretta Nip, RN Outcome: Not Progressing   Problem: Activity: Goal: Risk for activity intolerance will decrease 08/18/2022 0131 by Aretta Nip, RN Outcome: Progressing 08/18/2022 0130 by Aretta Nip, RN Outcome: Not Progressing   Problem: Nutrition: Goal: Adequate nutrition will be maintained 08/18/2022 0131 by Aretta Nip, RN Outcome: Progressing 08/18/2022 0130 by Aretta Nip, RN Outcome:  Not Progressing   Problem: Coping: Goal: Level of anxiety will decrease 08/18/2022 0131 by Aretta Nip, RN Outcome: Progressing 08/18/2022 0130 by Aretta Nip, RN Outcome: Not Progressing   Problem: Elimination: Goal: Will not experience complications related to bowel motility 08/18/2022 0131 by Aretta Nip, RN Outcome: Progressing 08/18/2022 0130 by Aretta Nip, RN Outcome: Not Progressing Goal: Will not experience complications related to urinary retention 08/18/2022 0131 by Aretta Nip, RN Outcome: Progressing 08/18/2022 0130 by Aretta Nip, RN Outcome: Not Progressing   Problem: Pain Managment: Goal: General experience of comfort will improve 08/18/2022 0131 by Aretta Nip, RN Outcome: Progressing 08/18/2022 0130 by Aretta Nip, RN Outcome: Not Progressing   Problem: Safety: Goal: Ability to remain free from injury will improve 08/18/2022 0131 by Aretta Nip, RN Outcome: Progressing 08/18/2022 0130 by Aretta Nip, RN Outcome: Not Progressing   Problem: Skin Integrity: Goal: Risk for impaired skin integrity will decrease 08/18/2022 0131 by Aretta Nip, RN Outcome: Progressing 08/18/2022 0130 by Aretta Nip, RN Outcome: Not Progressing

## 2022-08-18 NOTE — Progress Notes (Signed)
Inpatient Rehabilitation Admissions Coordinator   I have insurance approval and CIR bed to admit patient today after TEE and possible LOOP placement. I met at bedside with patient , wife and Dr Pearlean Brownie. I will make the arrangements.   Ottie Glazier, RN, MSN Rehab Admissions Coordinator [phone] 08/18/2022 10:35 AM

## 2022-08-19 ENCOUNTER — Telehealth: Payer: Self-pay

## 2022-08-19 ENCOUNTER — Encounter (HOSPITAL_COMMUNITY): Payer: Self-pay | Admitting: Cardiology

## 2022-08-19 DIAGNOSIS — I63512 Cerebral infarction due to unspecified occlusion or stenosis of left middle cerebral artery: Secondary | ICD-10-CM

## 2022-08-19 LAB — GLUCOSE, CAPILLARY
Glucose-Capillary: 115 mg/dL — ABNORMAL HIGH (ref 70–99)
Glucose-Capillary: 99 mg/dL (ref 70–99)
Glucose-Capillary: 113 mg/dL — ABNORMAL HIGH (ref 70–99)
Glucose-Capillary: 109 mg/dL — ABNORMAL HIGH (ref 70–99)

## 2022-08-19 LAB — CBC WITH DIFFERENTIAL/PLATELET
Abs Immature Granulocytes: 0.07 10*3/uL (ref 0.00–0.07)
Basophils Absolute: 0 10*3/uL (ref 0.0–0.1)
Basophils Relative: 0 %
Eosinophils Absolute: 0.2 10*3/uL (ref 0.0–0.5)
Eosinophils Relative: 2 %
HCT: 36.2 % — ABNORMAL LOW (ref 39.0–52.0)
Hemoglobin: 11.7 g/dL — ABNORMAL LOW (ref 13.0–17.0)
Immature Granulocytes: 1 %
Lymphocytes Relative: 19 %
Lymphs Abs: 1.9 10*3/uL (ref 0.7–4.0)
MCH: 27.5 pg (ref 26.0–34.0)
MCHC: 32.3 g/dL (ref 30.0–36.0)
MCV: 85.2 fL (ref 80.0–100.0)
Monocytes Absolute: 1.2 10*3/uL — ABNORMAL HIGH (ref 0.1–1.0)
Monocytes Relative: 11 %
Neutro Abs: 6.9 10*3/uL (ref 1.7–7.7)
Neutrophils Relative %: 67 %
Platelets: 206 10*3/uL (ref 150–400)
RBC: 4.25 MIL/uL (ref 4.22–5.81)
RDW: 14 % (ref 11.5–15.5)
WBC: 10.2 10*3/uL (ref 4.0–10.5)
nRBC: 0 % (ref 0.0–0.2)

## 2022-08-19 LAB — COMPREHENSIVE METABOLIC PANEL
ALT: 18 U/L (ref 0–44)
AST: 27 U/L (ref 15–41)
Albumin: 3.5 g/dL (ref 3.5–5.0)
Alkaline Phosphatase: 50 U/L (ref 38–126)
Anion gap: 9 (ref 5–15)
BUN: 50 mg/dL — ABNORMAL HIGH (ref 6–20)
CO2: 21 mmol/L — ABNORMAL LOW (ref 22–32)
Calcium: 8.8 mg/dL — ABNORMAL LOW (ref 8.9–10.3)
Chloride: 110 mmol/L (ref 98–111)
Creatinine, Ser: 3.33 mg/dL — ABNORMAL HIGH (ref 0.61–1.24)
GFR, Estimated: 21 mL/min — ABNORMAL LOW (ref >60)
Glucose, Bld: 117 mg/dL — ABNORMAL HIGH (ref 70–99)
Potassium: 4.1 mmol/L (ref 3.5–5.1)
Sodium: 140 mmol/L (ref 135–145)
Total Bilirubin: 0.8 mg/dL (ref 0.3–1.2)
Total Protein: 6.5 g/dL (ref 6.5–8.1)

## 2022-08-19 NOTE — Telephone Encounter (Signed)
Pt remains admitted.  Continue to follow.  Loop Recorder Follow up   Is patient connected to [facility]? Pt is in [facility], but has not yet transmitted.  Have steri-strips fallen off or been removed?   Does the patient need in office follow up? No   Please continue to monitor your cardiac device site for redness, swelling, and drainage. Call the device clinic at [phone] if you experience these symptoms, fever/chills, or have questions about your device.   Remote monitoring is used to monitor your cardiac device from home. This monitoring is scheduled every month by our office. It allows [place] to keep an eye on the functioning of your device to ensure it is working properly.

## 2022-08-19 NOTE — Progress Notes (Signed)
Inpatient Rehabilitation Center Individual Statement of Services  Patient Name:  [name]  Date:  08/19/2022  Welcome to the Inpatient Rehabilitation Center.  Our goal is to provide you with an individualized program based on your diagnosis and situation, designed to meet your specific needs.  With this comprehensive rehabilitation program, you will be expected to participate in at least 3 hours of rehabilitation therapies Monday-Friday, with modified therapy programming on the weekends.  Your rehabilitation program will include the following services:  Physical Therapy (PT), Occupational Therapy (OT), Speech Therapy (ST), 24 hour per day rehabilitation nursing, Therapeutic Recreaction (TR), Neuropsychology, Care Coordinator, Rehabilitation Medicine, Nutrition Services, Pharmacy Services, and Other  Weekly team conferences will be held on Wednesdays to discuss your progress.  Your Inpatient Rehabilitation Care Coordinator will talk with you frequently to get your input and to update you on team discussions.  Team conferences with you and your family in attendance may also be held.  Expected length of stay: 5-7 Days  Overall anticipated outcome: mod I to supervision   Depending on your progress and recovery, your program may change. Your Inpatient Rehabilitation Care Coordinator will coordinate services and will keep you informed of any changes. Your Inpatient Rehabilitation Care Coordinator's name and contact numbers are listed  below.  The following services may also be recommended but are not provided by the Inpatient Rehabilitation Center:   Home Health Rehabiltiation Services Outpatient Rehabilitation Services    Arrangements will be made to provide these services after discharge if needed.  Arrangements include referral to agencies that provide these services.  Your insurance has been verified to be:   uninsured Your primary doctor is:   Librarian, academic, Hermenia Fiscal, NP  Pertinent  information will be shared with your doctor and your insurance company.  Inpatient Rehabilitation Care Coordinator:  Lavera Guise, [place] [phone] or [phone]  Information discussed with and copy given to patient by: Andria Rhein, 08/19/2022, 11:38 AM

## 2022-08-19 NOTE — Plan of Care (Signed)
  Problem: RH Bathing Goal: LTG Patient will bathe all body parts with assist levels (OT) Description: LTG: Patient will bathe all body parts with assist levels (OT) Flowsheets (Taken 08/19/2022 1254) LTG: Pt will perform bathing with assistance level/cueing: Independent LTG: Position pt will perform bathing: Shower   Problem: RH Dressing Goal: LTG Patient will perform lower body dressing w/assist (OT) Description: LTG: Patient will perform lower body dressing with assist, with/without cues in positioning using equipment (OT) Flowsheets (Taken 08/19/2022 1254) LTG: Pt will perform lower body dressing with assistance level of: Independent   Problem: RH Toileting Goal: LTG Patient will perform toileting task (3/3 steps) with assistance level (OT) Description: LTG: Patient will perform toileting task (3/3 steps) with assistance level (OT)  Flowsheets (Taken 08/19/2022 1254) LTG: Pt will perform toileting task (3/3 steps) with assistance level: Independent   Problem: RH Simple Meal Prep Goal: LTG Patient will perform simple meal prep w/assist (OT) Description: LTG: Patient will perform simple meal prep with assistance, with/without cues (OT). Flowsheets (Taken 08/19/2022 1254) LTG: Pt will perform simple meal prep with assistance level of: Supervision/Verbal cueing LTG: Pt will perform simple meal prep w/level of: Ambulate without device   Problem: RH Light Housekeeping Goal: LTG Patient will perform light housekeeping w/assist (OT) Description: LTG: Patient will perform light housekeeping with assistance, with/without cues (OT). Flowsheets (Taken 08/19/2022 1254) LTG: Pt will perform light housekeeping with assistance level of: Supervision/Verbal cueing LTG: Pt will perform light housekeeping w/level of: Ambulate without device   Problem: RH Toilet Transfers Goal: LTG Patient will perform toilet transfers w/assist (OT) Description: LTG: Patient will perform toilet transfers with assist,  with/without cues using equipment (OT) Flowsheets (Taken 08/19/2022 1254) LTG: Pt will perform toilet transfers with assistance level of: Independent   Problem: RH Tub/Shower Transfers Goal: LTG Patient will perform tub/shower transfers w/assist (OT) Description: LTG: Patient will perform tub/shower transfers with assist, with/without cues using equipment (OT) Flowsheets (Taken 08/19/2022 1254) LTG: Pt will perform tub/shower stall transfers with assistance level of: Independent LTG: Pt will perform tub/shower transfers from: Tub/shower combination

## 2022-08-19 NOTE — Progress Notes (Signed)
Patient ID: [name], male   DOB: 1965/06/02, 57 y.o.   MRN: [id number] Met with the patient and wife to review current situation, rehab process,team conference and plan of care.Discussed secondary risks including HTN on coreg, HLD  (LDL 96/Trig 125) on lipitor, prediabetes (A1C 6.5) and CKD (Nephrologist following). Reviewed DAPT x 3 weeks then Plavix solo per neurology. Discussed dietary modification recommendations, stroke support group. Also reviewed loop recorder care with phone monitoring set up in place. Continue to follow along to address educational needs to facilitate preparation for discharge. [name]

## 2022-08-19 NOTE — H&P (Signed)
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 ED on 08/14/2022 noted by wife to be very weak and confused and halfway enter out of the bathtub at home.  Symptoms were sudden in onset.  Last known well 9 PM.  Patient presented with global aphasia.  Code stroke activated.  CT head negative for hemorrhage, aspects of 9, mild hyperdensity left MCA could indicate thrombus.  Tenecteplase administered.  CTA showed left M1 occlusion and neurointerventional radiology contacted.  Patient transferred to [facility] for thrombectomy.  On route he was noted to have periorbital welts and uvular swelling.  Was concern of possible angioedema secondary to tenecteplase.  He was intubated for airway protection and underwent cerebral angiogram by Dr. Tommie Sams. Recanalization of the left MCA vascular tree noted.  Critical care medicine consulted for management and placed on low-dose Neo-Synephrine infusion.  Extubated on 4/23.  2D echo with EF of approximately 60 to 65% with trivial MVR.  LDL 96, hemoglobin A1c 6.3%.  He was started on heparin subcutaneously for VTE prophylaxis.  The patient was maintained on aspirin 81 mg daily prior to admission and was started on Plavix and aspirin restarted.  Recommend aspirin and Plavix for 3 weeks then Plavix alone.  Cleared for oral diet and therapy evaluations carried out.  He underwent TCD with bubble study on 4/24.  Bilateral lower extremity venous duplex negative for DVT.  His transcranial Doppler was significant for Spencer grade 5 patent foramen ovale. EP consult obtained on 4/25 and TEE performed. Mod I bed mobility, supervision transfers, and min guard assist amb 300' without AD. Pt continues to demo R drift during gait with decreased awareness of obstacles  on R. Pt following simple commands 60% of trials. 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     Review of Systems  Constitutional:  Negative for chills and fever.  HENT:  Negative for hearing loss and sore throat.   Eyes:  Negative for blurred vision and double vision.  Respiratory:  Negative for cough and shortness of breath.   Cardiovascular:  Negative for chest pain and palpitations.  Gastrointestinal:  Negative for nausea and vomiting.  Genitourinary:  Negative for dysuria and urgency.  Musculoskeletal:  Negative for myalgias and neck pain.  Neurological:  Negative for dizziness and headaches.  Psychiatric/Behavioral:  Negative for depression. The patient does not have insomnia.         Past Medical History:  Diagnosis Date   Hypertension       The histories are not reviewed yet. Please review them in the "History" navigator section and refresh this SmartLink. No family history on file. Social History:  reports that he does not currently use alcohol. He reports that he does not currently use drugs. No history on file for tobacco use. Allergies:       Allergies  Allergen Reactions   Amitriptyline Anaphylaxis      Wife reported he had a cardiac arrest in the setting of starting amitriptyline in 2019   Tenecteplase Anaphylaxis      Hives and oral edema necessitating intubation following administration of Tenecteplase   Lisinopril Other (See Comments)  Angioedema suspected from TNK d/t timing of reaction; however, adding lisinopril so providers could be informed/cautious with future care (was on lisinopril pta).          Medications Prior to Admission  Medication Sig Dispense Refill   acetaminophen (TYLENOL) 500 MG tablet Take 500 mg by mouth every 6 (six) hours as needed for moderate pain or  mild pain.       amLODipine (NORVASC) 10 MG tablet Take 10 mg by mouth daily.       aspirin EC 81 MG tablet Take 81 mg by mouth daily.       carvedilol (COREG) 25 MG tablet Take 12.5 mg by mouth 2 (two) times daily.       chlorthalidone (HYGROTON) 25 MG tablet Take 12.5 mg by mouth daily.       febuxostat (ULORIC) 40 MG tablet Take 40 mg by mouth daily.       lisinopril (ZESTRIL) 10 MG tablet Take 10 mg by mouth daily.       VITAMIN D PO Take 1 tablet by mouth daily.              Home: Home Living Family/patient expects to be discharged to:: Private residence Living Arrangements: Spouse/significant other Available Help at Discharge: Family, Available 24 hours/day Type of Home: House Home Access: Stairs to enter [employer] of Steps: 3 Entrance Stairs-Rails: Can reach both Home Layout: Two level, Able to live on main level with bedroom/bathroom Alternate Level Stairs-Number of Steps: flight Bathroom Shower/Tub: Associate Professor: Yes Home Equipment: Crutches  Lives With: Spouse, Son, Daughter   Functional History: Prior Function Prior Level of Function : Independent/Modified Independent, Working/employed Mobility Comments: indep ADLs Comments: indep   Functional Status:  Mobility: Bed Mobility Overal bed mobility: Needs Assistance Bed Mobility: Supine to Sit, Sit to Supine Rolling: Modified independent (Device/Increase time) Sidelying to sit: Min assist Supine to sit: Supervision Sit to supine: Supervision General bed mobility comments: supervision/cues for initiation Transfers Overall transfer level: Needs assistance Equipment used: None Transfers: Sit to/from Stand Sit to Stand: Min guard Bed to/from chair/wheelchair/BSC transfer type:: Step pivot Step pivot transfers: Min guard General transfer comment: cues for initiation Ambulation/Gait Ambulation/Gait assistance: Min guard Gait Distance (Feet): 300  Feet Assistive device: None Gait Pattern/deviations: Step-through pattern, Drifts right/left General Gait Details: Drifting R. Decreased awareness of obstacles on R, requiring verbal/tactile cues to avoid. Gait velocity: mildly decreased Gait velocity interpretation: 1.31 - 2.62 ft/sec, indicative of limited community ambulator   ADL: ADL Overall ADL's : Needs assistance/impaired Eating/Feeding: Set up, Sitting Grooming: Oral care, Sitting, Minimal assistance Upper Body Bathing Details (indicate cue type and reason): setup simulated Lower Body Bathing: Sit to/from stand, Minimal assistance Lower Body Bathing Details (indicate cue type and reason): simulated Lower Body Dressing: Minimal assistance Lower Body Dressing Details (indicate cue type and reason): sitting to donn gripper socks Toilet Transfer: Ambulation, Minimal assistance Toilet Transfer Details (indicate cue type and reason): simulated Functional mobility during ADLs: Minimal assistance (hand held assist for ambulation around the unit) General ADL Comments: Pt with good motor movement noted in the RUE.  He did exhibit dropping of the toothbrush when attempting to hold it and remove the cap with the RUE.  When given items such as toothbrush and gripper socks he was able to integrate them appropriately, however when asked to tie his gown he was unable to attempt, even with therapist demonstration.  Needs visual cueing to follow for most  tasks.  Oxygen 100% on room air with HR at 115 BPM in sitting increasing into the mid 130s.  BP 148/96 in sitting.   Cognition: Cognition Overall Cognitive Status: Difficult to assess Arousal/Alertness: Awake/alert Orientation Level: Other (comment), Oriented to person, Oriented to time, Oriented to place, Oriented to situation (nods to questions) Attention: Sustained Sustained Attention: Impaired Sustained Attention Impairment: Functional basic Cognition Arousal/Alertness:  Awake/alert Behavior During Therapy: Flat affect, Impulsive Overall Cognitive Status: Difficult to assess Area of Impairment: Safety/judgement, Awareness, Problem solving, Following commands Following Commands: Follows one step commands inconsistently Safety/Judgement: Decreased awareness of safety, Decreased awareness of deficits Awareness: Emergent Problem Solving: Requires tactile cues, Difficulty sequencing General Comments: Following simple commands 60% of trials. Does best with demonstration as opposed to verbal cues. Verbal and motor perseveration noted but appears to be improving. Difficult to assess due to: Impaired communication   Physical Exam: Blood pressure (!) 133/102, pulse 75, temperature 98.3 F (36.8 C), temperature source Oral, resp. rate 18, SpO2 100 %. Physical Exam Constitutional:      General: He is not in acute distress.    Appearance: He is normal weight.  HENT:     Head: Normocephalic and atraumatic.  Eyes:     Extraocular Movements: Extraocular movements intact.     Pupils: Pupils are equal, round, and reactive to light.  Cardiovascular:     Rate and Rhythm: Normal rate and regular rhythm.  Pulmonary:     Effort: Pulmonary effort is normal.     Breath sounds: Normal breath sounds.  Abdominal:     General: Bowel sounds are normal.     Palpations: Abdomen is soft.  Musculoskeletal:     Right lower leg: No edema.     Left lower leg: No edema.  Neurological:     Mental Status: He is oriented to person, place, and time.     Comments: aphasic  Psychiatric:        Mood and Affect: Mood normal.        Behavior: Behavior normal.     Neuro:  Eyes without evidence of nystagmus  Tone is normal without evidence of spasticity Cerebellar exam shows no evidence of ataxia on finger nose finger or heel to shin testing No evidence of trunkal ataxia  Motor strength is 5/5 in bilateral deltoid, biceps, triceps, finger flexors and extensors, wrist flexors and  extensors, hip flexors, knee flexors and extensors, ankle dorsiflexors, plantar flexors, invertors and evertors, toe flexors and extensors Unable to close eyes to verbal command  Sensory exam is normal to pinprick, proprioception and light touch in the upper and lower limbs   Cranial nerves II- cannot assess III- no evidence of ptosis, upward, downward and medial gaze intact IV- no vertical diplopia or head tilt V- cannot assess VI- no pupil abduction weakness VII- no facial droop, good lid closure VII-cannot assess  X- limited verbal output assessment limited no hoarseness XI- no trap or SCM weakness XII- no glossal weakness     Lab Results Last 48 Hours        Results for orders placed or performed during the hospital encounter of 08/14/22 (from the past 48 hour(s))  Glucose, capillary     Status: Abnormal    Collection Time: 08/16/22 11:14 AM  Result Value Ref Range    Glucose-Capillary 138 (H) 70 - 99 mg/dL      Comment: Glucose reference range applies only to samples taken after fasting for at least 8 hours.  Glucose, capillary  Status: Abnormal    Collection Time: 08/16/22  3:46 PM  Result Value Ref Range    Glucose-Capillary 137 (H) 70 - 99 mg/dL      Comment: Glucose reference range applies only to samples taken after fasting for at least 8 hours.  Glucose, capillary     Status: Abnormal    Collection Time: 08/16/22 10:16 PM  Result Value Ref Range    Glucose-Capillary 139 (H) 70 - 99 mg/dL      Comment: Glucose reference range applies only to samples taken after fasting for at least 8 hours.  Basic metabolic panel     Status: Abnormal    Collection Time: 08/17/22  7:40 AM  Result Value Ref Range    Sodium 137 135 - 145 mmol/L    Potassium 4.2 3.5 - 5.1 mmol/L    Chloride 107 98 - 111 mmol/L    CO2 20 (L) 22 - 32 mmol/L    Glucose, Bld 134 (H) 70 - 99 mg/dL      Comment: Glucose reference range applies only to samples taken after fasting for at least 8 hours.     BUN 59 (H) 6 - 20 mg/dL    Creatinine, Ser 8.46 (H) 0.61 - 1.24 mg/dL    Calcium 8.3 (L) 8.9 - 10.3 mg/dL    GFR, Estimated 17 (L) >60 mL/min      Comment: (NOTE) Calculated using the CKD-EPI Creatinine Equation (2021)      Anion gap 10 5 - 15      Comment: Performed at [facility], [address] [place], [place] [address]  Glucose, capillary     Status: Abnormal    Collection Time: 08/17/22  7:49 AM  Result Value Ref Range    Glucose-Capillary 121 (H) 70 - 99 mg/dL      Comment: Glucose reference range applies only to samples taken after fasting for at least 8 hours.  Glucose, capillary     Status: Abnormal    Collection Time: 08/17/22 12:06 PM  Result Value Ref Range    Glucose-Capillary 126 (H) 70 - 99 mg/dL      Comment: Glucose reference range applies only to samples taken after fasting for at least 8 hours.  Glucose, capillary     Status: Abnormal    Collection Time: 08/17/22  5:02 PM  Result Value Ref Range    Glucose-Capillary 107 (H) 70 - 99 mg/dL      Comment: Glucose reference range applies only to samples taken after fasting for at least 8 hours.  Glucose, capillary     Status: Abnormal    Collection Time: 08/17/22  8:55 PM  Result Value Ref Range    Glucose-Capillary 107 (H) 70 - 99 mg/dL      Comment: Glucose reference range applies only to samples taken after fasting for at least 8 hours.    Comment 1 Notify RN         Imaging Results (Last 48 hours)  VAS [place] LOWER EXTREMITY VENOUS (DVT)   Result Date: 08/17/2022  Lower Venous DVT Study Patient Name:  DEEPAK BLESS Decarli  Date of Exam:   08/17/2022 Medical Rec #: [id number]               Accession #:    [id number] Date of Birth: [birth year]              Patient Gender: M Patient Age:   8 years Exam Location:  [facility] Procedure:  VAS [place] LOWER EXTREMITY VENOUS (DVT) Referring Phys: Hetty Blend --------------------------------------------------------------------------------  Indications:  Stroke. Positive TCD bubble study.  Comparison Study: No prior studies. Performing Technologist: Jean Rosenthal RDMS, RVT  Examination Guidelines: A complete evaluation includes B-mode imaging, spectral Doppler, color Doppler, and power Doppler as needed of all accessible portions of each vessel. Bilateral testing is considered an integral part of a complete examination. Limited examinations for reoccurring indications may be performed as noted. The reflux portion of the exam is performed with the patient in reverse Trendelenburg.  +---------+---------------+---------+-----------+----------+--------------+ RIGHT    CompressibilityPhasicitySpontaneityPropertiesThrombus Aging +---------+---------------+---------+-----------+----------+--------------+ CFV      Full           Yes      Yes                                 +---------+---------------+---------+-----------+----------+--------------+ SFJ      Full                                                        +---------+---------------+---------+-----------+----------+--------------+ FV Prox  Full                                                        +---------+---------------+---------+-----------+----------+--------------+ FV Mid   Full                                                        +---------+---------------+---------+-----------+----------+--------------+ FV DistalFull                                                        +---------+---------------+---------+-----------+----------+--------------+ PFV      Full                                                        +---------+---------------+---------+-----------+----------+--------------+ POP      Full           Yes      Yes                                 +---------+---------------+---------+-----------+----------+--------------+ PTV      Full                                                         +---------+---------------+---------+-----------+----------+--------------+ PERO     Full                                                        +---------+---------------+---------+-----------+----------+--------------+   +---------+---------------+---------+-----------+----------+--------------+  LEFT     CompressibilityPhasicitySpontaneityPropertiesThrombus Aging +---------+---------------+---------+-----------+----------+--------------+ CFV      Full           Yes      Yes                                 +---------+---------------+---------+-----------+----------+--------------+ SFJ      Full                                                        +---------+---------------+---------+-----------+----------+--------------+ FV Prox  Full                                                        +---------+---------------+---------+-----------+----------+--------------+ FV Mid   Full                                                        +---------+---------------+---------+-----------+----------+--------------+ FV DistalFull                                                        +---------+---------------+---------+-----------+----------+--------------+ PFV      Full                                                        +---------+---------------+---------+-----------+----------+--------------+ POP      Full           Yes      Yes                                 +---------+---------------+---------+-----------+----------+--------------+ PTV      Full                                                        +---------+---------------+---------+-----------+----------+--------------+ PERO     Full                                                        +---------+---------------+---------+-----------+----------+--------------+     Summary: RIGHT: - There is no evidence of deep vein thrombosis in the lower extremity.  - No cystic structure found in  the popliteal fossa.  LEFT: - There is no evidence of deep vein thrombosis in the lower extremity.  - No  cystic structure found in the popliteal fossa.  *See table(s) above for measurements and observations. Electronically signed by Sherald Hess MD on 08/17/2022 at 4:25:35 PM.    Final     VAS [place] TRANSCRANIAL DOPPLER W BUBBLES   Result Date: 08/17/2022  Transcranial Doppler with Bubble Patient Name:  COLLIN RENGEL Linzy  Date of Exam:   08/17/2022 Medical Rec #: [id number]               Accession #:    [id number] Date of Birth: [birth year]              Patient Gender: M Patient Age:   89 years Exam Location:  [facility] Procedure:      VAS [place] TRANSCRANIAL DOPPLER W BUBBLES Referring Phys: Hetty Blend --------------------------------------------------------------------------------  Indications: Stroke. Limitations: Unable to insonate transtemporal windows Comparison Study: No prior studies. Performing Technologist: Jean Rosenthal RDMS, RVT  Examination Guidelines: A complete evaluation includes B-mode imaging, spectral Doppler, color Doppler, and power Doppler as needed of all accessible portions of each vessel. Bilateral testing is considered an integral part of a complete examination. Limited examinations for reoccurring indications may be performed as noted.   Summary:  A vascular evaluation was performed. The left vertebral and carotid siphon was studied. An IV was inserted into the patient's right forearm. Verbal informed consent was obtained.  A full curtain of high intensity transient signals (HITS) was observed with valsalva, indicating a Spencer Grade 5 patent foramen ovale (PFO). *See table(s) above for TCD measurements and observations.    Preliminary            Blood pressure (!) 133/102, pulse 75, temperature 98.3 F (36.8 C), temperature source Oral, resp. rate 18, SpO2 100 %.   Medical Problem List and Plan: 1. Functional deficits secondary to left frontal and left anterior  insula acute cortical infarcts with additional small left parietal infarct, PFO. S/p tenecteplase, thrombectomy; has global aphasia             -patient may  shower             -ELOS/Goals: 5-7 d   2.  Antithrombotics: -DVT/anticoagulation:  Pharmaceutical: Heparin             -antiplatelet therapy: Aspirin and Plavix for three weeks followed by Plavix alone (DAPT started 4/22)   3. Pain Management: Tylenol as needed   4. Mood/Behavior/Sleep: LCSW to evaluate and provide emotional support             -antipsychotic agents: n/a   5. Neuropsych/cognition: This patient is not  capable of making decisions on his  own behalf.   6. Skin/Wound Care: Routine skin care checks   7. Fluids/Electrolytes/Nutrition: Routine Is and Os and follow-up chemistries   8: Hypertension: monitor TID and prn (Home meds: Amlodipine 10 mg, carvedilol 25 mg, lisinopril 10 mg)             -continue carvedilol 12.5 mg BID             -lisinopril now on allergy list due to #15   9: Hyperlipidemia: continue statin   10: Global aphasia: SLP eval   11: OSA: continue CPAP at night   12: DM-2: A1c 6.3%; CBGs better off steroids (no home meds)             -carb modified diet             -? DC SSI   13: CKD stage IV: baseline  serum creatinine ~3.5                 Latest Ref Rng & Units 08/19/2022    6:23 AM 08/18/2022    7:12 PM 08/17/2022    7:40 AM  BMP  Glucose 70 - 99 mg/dL 161   096   BUN 6 - 20 mg/dL 50   59   Creatinine 0.45 - 1.24 mg/dL 4.09  8.11  9.14   Sodium 135 - 145 mmol/L 140   137   Potassium 3.5 - 5.1 mmol/L 4.1   4.2   Chloride 98 - 111 mmol/L 110   107   CO2 22 - 32 mmol/L 21   20   Calcium 8.9 - 10.3 mg/dL 8.8   8.3    Stable 7/82   14: PFO: Spencer grade 5 s/p TEE- per Dr Excell Seltzer will f/u in office ~56mo, if no Afib, likely will need PFO closure    15: Angioedema s/p tenecteplase admin>>resolved   16: Anemia; mild: follow-up CBC   17.  Receptive aphasia plus severe speech apraxia   Milinda Antis, PA-C 08/18/2022  "I have personally performed a face to face diagnostic evaluation of this patient.  Additionally, I have reviewed and concur with the physician assistant's documentation above." Erick Colace M.D. [facility] Fellow Am Acad of Phys Med and Rehab Diplomate Am Board of Electrodiagnostic Med Fellow Am Board of Interventional Pain

## 2022-08-19 NOTE — Progress Notes (Signed)
With patient's consent, admission questions asked with patient and Wife due to patient's expressive aphasia.    Jon Dome, LPN

## 2022-08-19 NOTE — Progress Notes (Signed)
Angelina Sheriff, DO  Physician Physical Medicine and Rehabilitation   PMR Pre-admission     Signed   Date of Service: 08/17/2022  4:14 PM  Related encounter: Admission (Discharged) from 08/14/2022 in [facility]   Signed      Show:Clear all [x] Written[x] Templated[] Copied  Added by: [x] Beckie Salts Tye [place], RN[x] Angelina Sheriff, DO  [] Hover for details PMR Admission Coordinator Pre-Admission Assessment   Patient: [name] is an 57 y.o., male MRN: [id number] DOB: [birth year] Height:   Weight:     Insurance Information HMO:     PPO:      PCP:      IPA:      80/20:      OTHER:  PRIMARY: [place] [employer] [place]      Policy#: [id number]      Subscriber: pt CM Name: via fax      Phone#: [phone]     Fax#: [phone] Pre-Cert#: [id number] approved 4/24 until 5/7      Employer: [employer] Benefits:  Phone #: [phone]     Name: 4/24 Eff. Date: 04/25/22     Deduct: $1500      Out of Pocket Max: $5900      Life Max: none CIR: 70%      SNF: 70% 100 days Outpatient: 0 to $72 per visit     Co-Pay: visits per medical neccesity Home Health: 70%      Co-Pay: visits per medical neccesity DME: 70%     Co-Pay: 30% Providers: in network  SECONDARY: none   Financial Counselor:       Phone#:    The Data processing manager" for patients in Inpatient Rehabilitation Facilities with attached "Privacy Act Statement-Health Care Records" was provided and verbally reviewed with: N/A   Emergency Contact Information Contact Information       Name Relation Home Work Mobile    Krawczyk,Nicole Spouse     [phone]    Newland,Deja Daughter     [phone]         Current Medical History  Patient Admitting Diagnosis: CVA   History of Present Illness: 57 year old male with history of CKD stage 4, HTN, type 2 DM, HTN, HLD and gout. Found by wife weak on the right side, confused and unable to talk. Presented to [facility] via EMS on 4/21. Tele  stroke consulted, TNK administered and transferred to [facility] for endovascular thrombectomy but the CT angiogram showed a right M1 occlusion.    Imaging revealed left acute frontal, left anterior insula acute cortical infarcts. Small acute cortical small acute left parietal cortical infarct. ON first run of arteriogram it appeared that the left M1 occlusion had spontaneously recanalized, likely secondary to the TNK and no thrombectomy ws required. Initially remained intubated after procedure due to angioedema  felt possible due to reaction from TNK.   2D echo EF 60 to 65%, trivial MVR. TEE scheduled for 4/25 pending prior to admit to CIR. TCD bubble study positive/ LE DVT negative. Possible LOOP recorder at discharge. ASA prior to admit, now on ASA and clopidogrel daily. For 3 weeks followed by Plavix only. Extubated 4/23. Patient on lisinopril at home with no history of angioedema. Home med for HTN amlodipine, carvedilol and Lisinopril. LDL 96 with Lipitor added. Nop meds for type 2 DM, Hgb A1c 6.3. Possible OSA with possible sleep study during hospitalization. CKD 4 follow with [facility] and possible tranplant list.  Complete NIHSS TOTAL: 6   Patient's medical record from [facility] and [facility] has been reviewed by the rehabilitation admission coordinator and physician.   Past Medical History      Past Medical History:  Diagnosis Date   Hypertension      Has the patient had major surgery during 100 days prior to admission? No   Family History   family history is not on file.   Current Medications   Current Facility-Administered Medications:    0.9 %  sodium chloride infusion, , Intravenous, Continuous, Yvonna Alanis L, PA-C   acetaminophen (TYLENOL) tablet 650 mg, 650 mg, Oral, Q4H PRN, 650 mg at 08/16/22 1305 **OR** acetaminophen (TYLENOL) 160 MG/5ML solution 650 mg, 650 mg, Per Tube, Q4H PRN, 650 mg at 08/16/22 0738 **OR** acetaminophen (TYLENOL) suppository 650  mg, 650 mg, Rectal, Q4H PRN, de Melchor Amour, Jerilynn Mages, MD   aspirin chewable tablet 81 mg, 81 mg, Oral, Daily, 81 mg at 08/17/22 1032 **OR** aspirin suppository 300 mg, 300 mg, Rectal, Daily, Sethi, Pramod S, MD   atorvastatin (LIPITOR) tablet 20 mg, 20 mg, Oral, Daily, Richardo Priest, Erin C, NP, 20 mg at 08/17/22 1032   carvedilol (COREG) tablet 12.5 mg, 12.5 mg, Oral, BID, Richardo Priest, Erin C, NP, 12.5 mg at 08/17/22 2101   Chlorhexidine Gluconate Cloth 2 % PADS 6 each, 6 each, Topical, Q0600, Bhagat, Srishti L, MD, 6 each at 08/17/22 0800   clopidogrel (PLAVIX) tablet 75 mg, 75 mg, Oral, Daily, Pearlean Brownie, Pramod S, MD, 75 mg at 08/17/22 1032   fentaNYL (SUBLIMAZE) injection 25 mcg, 25 mcg, Intravenous, Q1H PRN, Carilyn Goodpasture, MD, 25 mcg at 08/16/22 0423   heparin injection 5,000 Units, 5,000 Units, Subcutaneous, Q8H, Lehner, Erin C, NP, 5,000 Units at 08/18/22 9147   insulin aspart (novoLOG) injection 0-9 Units, 0-9 Units, Subcutaneous, TID WC & HS, Paliwal, Aditya, MD, 1 Units at 08/17/22 1212   iohexol (OMNIPAQUE) 300 MG/ML solution 150 mL, 150 mL, Intra-arterial, Once PRN, de Melchor Amour, Jerilynn Mages, MD   Oral care mouth rinse, 15 mL, Mouth Rinse, PRN, Bhagat, Srishti L, MD   senna-docusate (Senokot-S) tablet 1 tablet, 1 tablet, Oral, QHS PRN, Milon Dikes, MD   Patients Current Diet:  Diet Order                  Diet NPO time specified Except for: Sips with Meds  Diet effective midnight                     NPO for TEE 4/25 only.   Precautions / Restrictions Precautions Precautions: Fall, Other (comment) Precaution Comments: expressive and receptive difficulties Restrictions Weight Bearing Restrictions: No    Has the patient had 2 or more falls or a fall with injury in the past year? 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 Independent   Indoor Mobility: Did the patient need assistance with walking from room to room (with or without device)? Independent   Stairs: Did the patient need assistance with internal or external stairs (with or without device)? Independent   Functional Cognition: Did the patient need help planning regular tasks such as shopping or remembering to take medications? Independent   Patient Information Are you of Hispanic, Latino/a,or Spanish origin?: X. Patient unable to respond (white states black) What is your race?:  B. Black or African American Do you need or want an interpreter to communicate with a doctor or health care staff?: 9. Unable to respond (wife states no)   Patient's Response To:  Health Literacy and Transportation Is the patient able to respond to health literacy and transportation needs?: No Health Literacy - How often do you need to have someone help you when you read instructions, pamphlets, or other written material from your doctor or pharmacy?: Patient unable to respond In the past 12 months, has lack of transportation kept you from medical appointments or from getting medications?: No (wife states no) In the past 12 months, has lack of transportation kept you from meetings, work, or from getting things needed for daily living?: No (wife states no)   Journalist, newspaper / Equipment Home Equipment: Crutches   Prior Device Use: Indicate devices/aids used by the patient prior to current illness, exacerbation or injury? None of the above   Current Functional Level Cognition   Arousal/Alertness: Awake/alert Overall Cognitive Status: Difficult to assess Difficult to assess due to: Impaired communication Orientation Level: Other (comment), Oriented to person, Oriented to time, Oriented to place, Oriented to situation (nods to questions) Following Commands: Follows one step commands inconsistently Safety/Judgement: Decreased awareness of safety,  Decreased awareness of deficits General Comments: Following simple commands 60% of trials. Does best with demonstration as opposed to verbal cues. Verbal and motor perseveration noted but appears to be improving. Attention: Sustained Sustained Attention: Impaired Sustained Attention Impairment: Functional basic    Extremity Assessment (includes Sensation/Coordination)   Upper Extremity Assessment: RUE deficits/detail RUE Deficits / Details: slight decreased functional use noted with pt dropping the toothbrush when attempting to hold it in his hand while unscrewing the cap on the toothpaste.  Strength overall at least 4/5 but difficult to accurately assess secondary to redeptive deficits. RUE Sensation:  (difficult to determine secondary to receptive deficits.) RUE Coordination: decreased fine motor  Lower Extremity Assessment: Defer to PT evaluation RLE Deficits / Details: weaker than L based on functional assessment, noted R knee weakness during ambulation, tested functionally as pt unable to follow commands for MMT RLE Coordination: decreased gross motor (heavy footed with R LE during ambulation)     ADLs   Overall ADL's : Needs assistance/impaired Eating/Feeding: Set up, Sitting Grooming: Oral care, Sitting, Minimal assistance Upper Body Bathing Details (indicate cue type and reason): setup simulated Lower Body Bathing: Sit to/from stand, Minimal assistance Lower Body Bathing Details (indicate cue type and reason): simulated Lower Body Dressing: Minimal assistance Lower Body Dressing Details (indicate cue type and reason): sitting to donn gripper socks Toilet Transfer: Ambulation, Minimal assistance Toilet Transfer Details (indicate cue type and reason): simulated Functional mobility during ADLs: Minimal assistance (hand held assist for ambulation around the unit) General ADL Comments: Pt with good motor movement noted in the RUE.  He did exhibit dropping of the toothbrush when  attempting to hold it and remove the cap with the RUE.  When given items such as toothbrush and gripper socks he was able to integrate them appropriately, however when asked to tie his gown he was unable to attempt, even with therapist demonstration.  Needs visual cueing to follow for most tasks.  Oxygen 100% on room air with HR at 115 BPM in sitting increasing into the mid 130s.  BP 148/96 in sitting.     Mobility   Overal bed mobility: Needs Assistance Bed Mobility: Supine to Sit, Sit to Supine Rolling: Modified independent (Device/Increase time) Sidelying  to sit: Min assist Supine to sit: Supervision Sit to supine: Supervision General bed mobility comments: supervision/cues for initiation     Transfers   Overall transfer level: Needs assistance Equipment used: None Transfers: Sit to/from Stand Sit to Stand: Min guard Bed to/from chair/wheelchair/BSC transfer type:: Step pivot Step pivot transfers: Min guard General transfer comment: cues for initiation     Ambulation / Gait / Stairs / Wheelchair Mobility   Ambulation/Gait Ambulation/Gait assistance: Land (Feet): 300 Feet Assistive device: None Gait Pattern/deviations: Step-through pattern, Drifts right/left General Gait Details: Drifting R. Decreased awareness of obstacles on R, requiring verbal/tactile cues to avoid. Gait velocity: mildly decreased Gait velocity interpretation: 1.31 - 2.62 ft/sec, indicative of limited community ambulator     Posture / Balance Dynamic Sitting Balance Sitting balance - Comments: pt able to don socks, min guard due to quick movements Balance Overall balance assessment: Needs assistance Sitting-balance support: Feet supported, No upper extremity supported Sitting balance-Leahy Scale: Normal Sitting balance - Comments: pt able to don socks, min guard due to quick movements Standing balance support: No upper extremity supported, During functional activity Standing balance-Leahy  Scale: Good Standing balance comment: Pt able to stand at the sink and maintain balance with completion of oral hygiene at the sink.     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    Previous Home Environment  Living Arrangements: Spouse/significant other  Lives With: Spouse, Son, Daughter Available Help at Discharge: Family, Available 24 hours/day Type of Home: House Home Layout: Two level, Able to live on main level with bedroom/bathroom Alternate Level Stairs-Number of Steps: flight Home Access: Stairs to enter Entrance Stairs-Rails: Can reach both Entrance Stairs-Number of Steps: 3 Bathroom Shower/Tub: Associate Professor: Yes How Accessible: Accessible via walker Home Care Services: No   Discharge Living Setting Plans for Discharge Living Setting: Patient's home, Lives with (comment) (wife son, and daughter) Type of Home at Discharge: House Discharge Home Layout: Two level, Able to live on main level with bedroom/bathroom Alternate Level Stairs-Number of Steps: flight Discharge Home Access: Stairs to enter Entrance Stairs-Rails: Can reach both Entrance Stairs-Number of Steps: 3 Discharge Bathroom Shower/Tub: Tub/shower unit Discharge Bathroom Toilet: Standard Discharge Bathroom Accessibility: Yes How Accessible: Accessible via walker Does the patient have any problems obtaining your medications?: No   Social/Family/Support Systems Patient Roles: Spouse, Parent, Other (Comment) (employee) Contact Information: wife, Joni Reining Anticipated Caregiver: wife and daughter Anticipated Industrial/product designer Information: see contacts Ability/Limitations of Caregiver: wife can work remote; dtr can Public house manager Availability: 24/7 Discharge Plan Discussed with Primary Caregiver: Yes Is Caregiver In Agreement with Plan?: Yes Does Caregiver/Family have Issues with Lodging/Transportation  while Pt is in Rehab?: No   Goals Patient/Family Goal for Rehab: mod I to supervision with PT, OT and SLP Expected length of stay: ELOS 5 to 7 days Pt/Family Agrees to Admission and willing to participate: Yes Program Orientation Provided & Reviewed with Pt/Caregiver Including Roles  & Responsibilities: Yes   Decrease burden of Care through IP rehab admission: n/a   Possible need for SNF placement upon discharge: not anticipated   Patient Condition: I have reviewed medical records from [facility] and [facility]  hospital, spoken with  patient, spouse, and daughter. I met with patient at the bedside for inpatient rehabilitation assessment.  Patient will benefit from ongoing PT, OT, and SLP, can actively participate in 3 hours of therapy a day 5 days of the week,  and can make measurable gains during the admission.  Patient will also benefit from the coordinated team approach during an Inpatient Acute Rehabilitation admission.  The patient will receive intensive therapy as well as Rehabilitation physician, nursing, social worker, and care management interventions.  Due to bladder management, bowel management, safety, skin/wound care, disease management, medication administration, pain management, and patient education the patient requires 24 hour a day rehabilitation nursing.  The patient is currently min guard assist overall with global aphasia with mobility and basic ADLs.  Discharge setting and therapy post discharge at home with outpatient is anticipated.  Patient has agreed to participate in the Acute Inpatient Rehabilitation Program and will admit today.   Preadmission Screen Completed By:  Clois Dupes, RN MSN 08/18/2022 10:42 AM ______________________________________________________________________   Discussed status with Dr. Shearon Stalls on 08/18/22 at 1043 and received approval for admission today.   Admission Coordinator:  Clois Dupes, RN MSN time 1043 Date 08/18/22     Assessment/Plan: Diagnosis: Does the need for close, 24 hr/day Medical supervision in concert with the patient's rehab needs make it unreasonable for this patient to be served in a less intensive setting? 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 Yes Does the patient require coordinated care of a physician, rehab nurse, PT, OT, and SLP to address physical and functional deficits in the context of the above medical diagnosis(es)? Yes Addressing deficits in the following areas: balance, endurance, locomotion, strength, transferring, bowel/bladder control, bathing, dressing, feeding, grooming, toileting, cognition, speech, and language Can the patient actively participate in an intensive therapy program of at least 3 hrs of therapy 5 days a week? Yes The potential for patient to make measurable gains while on inpatient rehab is good Anticipated functional outcomes upon discharge from inpatient rehab: supervision PT, supervision OT, supervision SLP Estimated rehab length of stay to reach the above functional goals is: 5-7 days Anticipated discharge destination: Home 10. Overall Rehab/Functional Prognosis: good     MD Signature:   Angelina Sheriff, DO 08/18/2022          Revision History

## 2022-08-19 NOTE — Evaluation (Signed)
Occupational Therapy Assessment and Plan  Patient Details  Name: [name] MRN: [id number] Date of Birth: 21-Mar-1966  OT Diagnosis: altered mental status, apraxia, and cognitive deficits Rehab Potential: Rehab Potential (ACUTE ONLY): Good ELOS: 5-7 days   Today's Date: 08/19/2022 OT Individual Time: 2841-3244 OT Individual Time Calculation (min): 75 min     Hospital Problem: Principal Problem:   Acute ischemic left MCA stroke [facility])   Past Medical History:  Past Medical History:  Diagnosis Date   Hypertension    LVH (left ventricular hypertrophy)    Myocardial infarction (HCC)    Vitamin D deficiency    Past Surgical History:  Past Surgical History:  Procedure Laterality Date   ANTERIOR CRUCIATE LIGAMENT REPAIR     COLONOSCOPY WITH PROPOFOL N/A 04/11/2018   Procedure: COLONOSCOPY WITH PROPOFOL;  Surgeon: Toledo, Boykin Nearing, MD;  Location: [facility] ENDOSCOPY;  Service: Gastroenterology;  Laterality: N/A;   GASTRIC RESECTION     IR ANGIO INTRA EXTRACRAN SEL INTERNAL CAROTID UNI L MOD SED  08/15/2022   IR US GUIDE VASC ACCESS RIGHT  08/15/2022   LAPAROTOMY     LOOP RECORDER INSERTION N/A 08/18/2022   Procedure: LOOP RECORDER INSERTION;  Surgeon: Regan Lemming, MD;  Location: [facility] INVASIVE CV LAB;  Service: Cardiovascular;  Laterality: N/A;   RADIOLOGY WITH ANESTHESIA N/A 08/14/2022   Procedure: IR WITH ANESTHESIA;  Surgeon: Julieanne Cotton, MD;  Location: [facility] OR;  Service: Radiology;  Laterality: N/A;   TEE WITHOUT CARDIOVERSION N/A 08/18/2022   Procedure: TRANSESOPHAGEAL ECHOCARDIOGRAM;  Surgeon: [name], MD;  Location: [facility] INVASIVE CV LAB;  Service: Cardiovascular;  Laterality: N/A;    Assessment & Plan Clinical Impression: [name] is 57 year old male who presented to the [facility] ED on 08/14/2022 noted by wife to be very weak and confused and halfway enter out of the bathtub at home. Symptoms were sudden in onset. Last known well 9 PM. Patient presented with  global aphasia. Code stroke activated. CT head negative for hemorrhage, aspects of 9, mild hyperdensity left MCA could indicate thrombus. Tenecteplase administered. CTA showed left M1 occlusion and neurointerventional radiology contacted. Patient transferred to [facility] for thrombectomy. On route he was noted to have periorbital welts and uvular swelling. Was concern of possible angioedema secondary to tenecteplase. He was intubated for airway protection and underwent cerebral angiogram by Dr. Tommie Sams. Recanalization of the left MCA vascular tree noted. Critical care medicine consulted for management and placed on low-dose Neo-Synephrine infusion. Extubated on 4/23. 2D echo with EF of approximately 60 to 65% with trivial MVR. LDL 96, hemoglobin A1c 6.3%. He was started on heparin subcutaneously for VTE prophylaxis. The patient was maintained on aspirin 81 mg daily prior to admission and was started on Plavix and aspirin restarted. Recommend aspirin and Plavix for 3 weeks then Plavix alone. Cleared for oral diet and therapy evaluations carried out. He underwent TCD with bubble study on 4/24. Bilateral lower extremity venous duplex negative for DVT. His transcranial Doppler was significant for Spencer grade 5 patent foramen ovale. EP consult obtained on 4/25 and TEE performed. Mod I bed mobility, supervision transfers, and min guard assist amb 300' without AD. Pt continues to demo R drift during gait with decreased awareness of obstacles on R. Pt following simple commands 60% of trials. 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 Patient  transferred to CIR on 08/18/2022 .    Patient currently requires  CGA  with basic self-care skills and IADL secondary to decreased cardiorespiratoy endurance, motor apraxia and  decreased motor planning, ideational apraxia, decreased safety awareness, and decreased standing balance and decreased balance strategies.  Prior to hospitalization, patient could complete BADLs, IADLs, work with independent .  Patient will benefit from skilled intervention to decrease level of assist with basic self-care skills, increase independence with basic self-care skills, and increase level of independence with iADL prior to discharge home with care partner.  Anticipate patient will require intermittent supervision and no further OT follow recommended.  OT - End of Session Activity Tolerance: Improving Endurance Deficit: Yes Endurance Deficit Description: mild OT Assessment Rehab Potential (ACUTE ONLY): Good OT Patient demonstrates impairments in the following area(s): Safety;Balance;Cognition;Skin Integrity;Motor;Perception OT Basic ADL's Functional Problem(s): Bathing;Dressing;Toileting;Grooming OT Advanced ADL's Functional Problem(s): Laundry;Light Housekeeping;Simple Meal Preparation OT Transfers Functional Problem(s): Tub/Shower OT Plan OT Intensity: Minimum of 1-2 x/day, 45 to 90 minutes OT Frequency: 5 out of 7 days OT Duration/Estimated Length of Stay: 5-7 days OT Treatment/Interventions: Balance/vestibular training;Discharge planning;Pain management;Self Care/advanced ADL retraining;Therapeutic Activities;UE/LE Coordination activities;Cognitive remediation/compensation;Functional mobility training;Patient/family education;Skin care/wound managment;Therapeutic Exercise;Visual/perceptual remediation/compensation;Community reintegration;DME/adaptive equipment instruction;Neuromuscular re-education;Psychosocial support;UE/LE Strength taining/ROM OT Self Feeding Anticipated Outcome(s): Independent OT Basic Self-Care Anticipated Outcome(s): Independent OT Toileting Anticipated Outcome(s): Independent OT Bathroom Transfers Anticipated Outcome(s): Independent OT  Recommendation Patient destination: Home Follow Up Recommendations: None Equipment Recommended: To be determined   OT Evaluation Precautions/Restrictions  Precautions Precautions: Fall;Other (comment) Precaution Comments: global aphasia, loop recorder - keep cellphone with pt Restrictions Weight Bearing Restrictions: No General Chart Reviewed: Yes Additional Pertinent History: CKD stage 4, HTN, type 2 DM, HTN, HLD and gout Family/Caregiver Present: Yes (wife, nikki) Vital Signs  Pain Pain Assessment Pain Scale: 0-10 Pain Score: 0-No pain Home Living/Prior Functioning Home Living Family/patient expects to be discharged to:: Private residence Living Arrangements: Spouse/significant other, Children Available Help at Discharge: Family, Available 24 hours/day Type of Home: House Home Access: Stairs to enter [employer] of Steps: 3 Entrance Stairs-Rails: Can reach both Home Layout: Two level, Able to live on main level with bedroom/bathroom Alternate Level Stairs-Number of Steps: flight Bathroom Shower/Tub: Armed forces operational officer Accessibility: Yes  Lives With: Spouse, Son, Daughter IADL History Homemaking Responsibilities: Yes Meal Prep Responsibility: Secondary Laundry Responsibility: Secondary Cleaning Responsibility: Secondary Bill Paying/Finance Responsibility: Secondary Shopping Responsibility: Secondary Child Care Responsibility: No Current License: Yes Mode of Transportation: Car Occupation: Full time employment Type of Occupation: Clinical biochemist Leisure and Hobbies: Enjoys coaching basketball Prior Function Level of Independence: Independent with basic ADLs, Independent with homemaking with ambulation  Able to Take Stairs?: Yes Driving: Yes Vocation: Full time employment Vision Baseline Vision/History: 1 Wears glasses Ability to See in Adequate Light: 0 Adequate Patient Visual Report: Other (comment) (Pt unable to  report d/t aphasia) Vision Assessment?: Yes Eye Alignment: Within Functional Limits Ocular Range of Motion: Within Functional Limits Alignment/Gaze Preference: Within Defined Limits Tracking/Visual Pursuits: Able to track stimulus in all quads without difficulty Saccades: Within functional limits Convergence: Within functional limits Visual Fields: No apparent deficits Additional Comments: Unable to accurately assess d/t aphasia Perception  Perception: Impaired Praxis Praxis: Impaired (potential for mild motor planning defict or ideation apraxia; required visual model for finger to nose assessment. Difficult to assess motor praxis vs. aphasia) Praxis Impairment Details: Ideation;Motor planning Cognition Cognition Overall Cognitive Status: Difficult to assess Arousal/Alertness: Awake/alert Orientation Level: Other(comment) (presents to be alert, difficult to  assess d/t aphasia) Memory: Appears intact Attention: Selective Selective Attention: Appears intact Awareness: Appears intact Problem Solving: Appears intact Executive Function:  (diffiuclt to assess d/t aphaisa, will continue to evaluate during hospial stay) Safety/Judgment: Impaired (VB cues required to sit during LB dressing) Brief Interview for Mental Status (BIMS) Repetition of Three Words (First Attempt): Nonsensical (difficult to complete d/t aphasia) Temporal Orientation: Year: Nonsensical Temporal Orientation: Month: Nonsensical Temporal Orientation: Day: Nonsensical Recall: "Sock": Nonsensical Recall: "Blue": Nonsensical Recall: "Bed": Nonsensical BIMS Summary Score: 99 Sensation Sensation Light Touch: Appears Intact Hot/Cold: Appears Intact Proprioception: Appears Intact Stereognosis: Not tested Coordination Fine Motor Movements are Fluid and Coordinated: Yes Finger Nose Finger Test: Smooth equal movements BUEs Motor  Motor Motor: Within Functional Limits  Trunk/Postural Assessment  Cervical  Assessment Cervical Assessment: Within Functional Limits Thoracic Assessment Thoracic Assessment: Within Functional Limits Lumbar Assessment Lumbar Assessment: Within Functional Limits Postural Control Postural Control: Within Functional Limits  Balance Balance Balance Assessed: Yes Static Sitting Balance Static Sitting - Balance Support: Feet supported Static Sitting - Level of Assistance: 6: Modified independent (Device/Increase time) Dynamic Sitting Balance Dynamic Sitting - Level of Assistance: 5: Stand by assistance Static Standing Balance Static Standing - Level of Assistance: 5: Stand by assistance Dynamic Standing Balance Dynamic Standing - Level of Assistance: 5: Stand by assistance Extremity/Trunk Assessment RUE Assessment RUE Assessment: Within Functional Limits LUE Assessment LUE Assessment: Within Functional Limits  Care Tool Care Tool Self Care Eating   Eating Assist Level: Set up assist    Oral Care    Oral Care Assist Level: Supervision/Verbal cueing    Bathing   Body parts bathed by patient: Right arm;Left arm;Chest;Abdomen;Front perineal area;Buttocks;Right upper leg;Left upper leg;Face;Left lower leg;Right lower leg     Assist Level: Supervision/Verbal cueing    Upper Body Dressing(including orthotics)   What is the patient wearing?: Pull over shirt   Assist Level: Supervision/Verbal cueing    Lower Body Dressing (excluding footwear)   What is the patient wearing?: Pants Assist for lower body dressing: Supervision/Verbal cueing    Putting on/Taking off footwear   What is the patient wearing?: Socks;Shoes Assist for footwear: Supervision/Verbal cueing       Care Tool Toileting Toileting activity   Assist for toileting: Contact Guard/Touching assist     Care Tool Bed Mobility Roll left and right activity   Roll left and right assist level: Supervision/Verbal cueing    Sit to lying activity   Sit to lying assist level: Supervision/Verbal  cueing    Lying to sitting on side of bed activity   Lying to sitting on side of bed assist level: the ability to move from lying on the back to sitting on the side of the bed with no back support.: Supervision/Verbal cueing     Care Tool Transfers Sit to stand transfer   Sit to stand assist level: Supervision/Verbal cueing    Chair/bed transfer   Chair/bed transfer assist level: Contact Guard/Touching assist     Toilet transfer   Assist Level: Contact Guard/Touching assist     Care Tool Cognition  Expression of Ideas and Wants Expression of Ideas and Wants: 1. Rarely/Never expressess or very difficult - rarely/never expresses self or speech is very difficult to understand  Understanding Verbal and Non-Verbal Content Understanding Verbal and Non-Verbal Content: 2. Sometimes understands - understands only basic conversations or simple, direct phrases. Frequently requires cues to understand   Memory/Recall Ability Memory/Recall Ability :  (difficult to assess d/t phasia)   Refer to  Care Plan for Long Term Goals  SHORT TERM GOAL WEEK 1 OT Short Term Goal 1 (Week 1): STG=LTG d/t Pt ELOS  Recommendations for other services: None    Skilled Therapeutic Intervention Skilled Therapeutic Interventions/Progress Updates:  1:1 OT evaluation and treatment initiated with skilled education provided on OT role, goals, and POC. Pt's wife present throughout evaluation to provide PLOF information d/t Pt continuing to present with global aphasia with expressive>receptive. Utilized gestural cues, simple statements, and pictures to support communication. Completed BIMs, MMT, sensation, strength, and coordination assessments at beginning of session. Pt able to complete BADLs at levels listed below completing sponge bath this session d/t recent loop recorder placement. Pt completed functional mobility this session without AD close supervision to occasional CGA.  Pt completed grip strength assessment seated  EOM in therapy gym. Although RUE impacted by CVA, no hemiparesis present as noted by grip strength assessment.  L Nondominant Hand:  Trial 1: 102 lbs Trial 2: 102 lbs Trial 3: 95 lbs Average: 99.66 lbs R Dominant Hand:  Trial 1: 100 lbs Trial 2: 115 lbs Trial 3: 110lbs Average: 108.33 lbs Pt completed 9-hole peg test as a measure for FM coordination while seated in chair at table.  R Hand:  Trial 1: 30.75 sec Trail 2: 20. 43 sec Trial 3: 22.03 sec Average: 24.4 sec L Hand:  Trail 1: 28.45 sec Trial 2: 21.31 sec Trial 3: 23.43 sec Average: 24.4 sec Pt able to ambulate to and from therapy gym without AD with no SOB or LOB noted and rest break provided following. Pt left resting in bed at end of session with call bell in reach, bed alarm on, and all needs met.   ADL ADL Eating: Set up Where Assessed-Eating: Edge of bed Grooming: Supervision/safety;Contact guard Where Assessed-Grooming: Standing at sink Upper Body Bathing: Supervision/safety Where Assessed-Upper Body Bathing: Sitting at sink Lower Body Bathing: Contact guard Where Assessed-Lower Body Bathing: Sitting at sink;Standing at sink Upper Body Dressing: Supervision/safety Where Assessed-Upper Body Dressing: Edge of bed Lower Body Dressing: Supervision/safety Where Assessed-Lower Body Dressing: Edge of bed Toileting: Contact guard;Supervision/safety Where Assessed-Toileting: Teacher, adult education: Furniture conservator/restorer Method: Proofreader: Engineer, technical sales: Not assessed Film/video editor: Administrator, arts Method: Designer, industrial/product: Grab bars Mobility  Bed Mobility Bed Mobility: Rolling Right;Rolling Left;Right Sidelying to [employer] Right: Supervision/verbal cueing Rolling Left: Supervision/Verbal cueing Right Sidelying to Sit: Supervision/Verbal cueing Transfers Sit to Stand: Supervision/Verbal cueing Stand to Sit:  Supervision/Verbal cueing   Discharge Criteria: Patient will be discharged from OT if patient refuses treatment 3 consecutive times without medical reason, if treatment goals not met, if there is a change in medical status, if patient makes no progress towards goals or if patient is discharged from hospital.  The above assessment, treatment plan, treatment alternatives and goals were discussed and mutually agreed upon: by patient and by family  Army Fossa 08/19/2022, 12:50 PM

## 2022-08-19 NOTE — Plan of Care (Signed)
  Problem: RH Comprehension Communication Goal: LTG Patient will comprehend basic/complex auditory (SLP) Description: LTG: Patient will comprehend basic/complex auditory information with cues (SLP). Flowsheets (Taken 08/19/2022 1401) LTG: Patient will comprehend auditory information with cueing (SLP): Minimal Assistance - Patient > 75%   Problem: RH Expression Communication Goal: LTG Patient will express needs/wants via multi-modal(SLP) Description: LTG:  Patient will express needs/wants via multi-modal communication (gestures/written, etc) with cues (SLP) Flowsheets (Taken 08/19/2022 1401) LTG: Patient will express needs/wants via multimodal communication (gestures/written, etc) with cueing (SLP): Minimal Assistance - Patient > 75% Goal: LTG Patient will verbally express basic/complex needs(SLP) Description: LTG:  Patient will verbally express basic/complex needs, wants or ideas with cues  (SLP) Flowsheets (Taken 08/19/2022 1401) LTG: Patient will verbally express basic/complex needs, wants or ideas (SLP): Minimal Assistance - Patient > 75% Goal: LTG Patient will increase word finding of common (SLP) Description: LTG:  Patient will increase word finding of common objects/daily info/abstract thoughts with cues using compensatory strategies (SLP). Flowsheets (Taken 08/19/2022 1401) LTG: Patient will increase word finding of common (SLP): Minimal Assistance - Patient > 75%

## 2022-08-19 NOTE — Evaluation (Signed)
Speech Language Pathology Assessment and Plan  Patient Details  Name: [name] MRN: [id number] Date of Birth: [birth year]  SLP Diagnosis: Aphasia;Apraxia;Speech and Language deficits  Rehab Potential: Good ELOS: 5-7 days    Today's Date: 08/19/2022 SLP Individual Time: 4098-1191 SLP Individual Time Calculation (min): 57 min   Hospital Problem: Principal Problem:   Acute ischemic left MCA stroke [facility])  Past Medical History:  Past Medical History:  Diagnosis Date   Hypertension    LVH (left ventricular hypertrophy)    Myocardial infarction (HCC)    Vitamin D deficiency    Past Surgical History:  Past Surgical History:  Procedure Laterality Date   ANTERIOR CRUCIATE LIGAMENT REPAIR     COLONOSCOPY WITH PROPOFOL N/A 04/11/2018   Procedure: COLONOSCOPY WITH PROPOFOL;  Surgeon: Toledo, Boykin Nearing, MD;  Location: [facility] ENDOSCOPY;  Service: Gastroenterology;  Laterality: N/A;   GASTRIC RESECTION     IR ANGIO INTRA EXTRACRAN SEL INTERNAL CAROTID UNI L MOD SED  08/15/2022   IR US GUIDE VASC ACCESS RIGHT  08/15/2022   LAPAROTOMY     LOOP RECORDER INSERTION N/A 08/18/2022   Procedure: LOOP RECORDER INSERTION;  Surgeon: Regan Lemming, MD;  [facility]: [facility] INVASIVE CV LAB;  Service: Cardiovascular;  Laterality: N/A;   RADIOLOGY WITH ANESTHESIA N/A 08/14/2022   Procedure: IR WITH ANESTHESIA;  Surgeon: Julieanne Cotton, MD;  Location: [facility] OR;  Service: Radiology;  Laterality: N/A;   TEE WITHOUT CARDIOVERSION N/A 08/18/2022   Procedure: TRANSESOPHAGEAL ECHOCARDIOGRAM;  Surgeon: [name], MD;  Location: [facility] INVASIVE CV LAB;  Service: Cardiovascular;  Laterality: N/A;    Assessment / Plan / Recommendation Clinical Impression CC: Functional deficits secondary to left frontal and left anterior insula acute cortical infarcts with additional small left parietal infarct    [name] is 57 year old male who presented to the [facility] ED on 08/14/2022 noted by wife to be very weak and  confused and halfway enter out of the bathtub at home. Symptoms were sudden in onset. Last known well 9 PM. Patient presented with global aphasia. Code stroke activated. CT head negative for hemorrhage, aspects of 9, mild hyperdensity left MCA could indicate thrombus. Tenecteplase administered. CTA showed left M1 occlusion and neurointerventional radiology contacted. Patient transferred to [facility] for thrombectomy. On route he was noted to have periorbital welts and uvular swelling. Was concern of possible angioedema secondary to tenecteplase. He was intubated for airway protection and underwent cerebral angiogram by Dr. Tommie Sams. Recanalization of the left MCA vascular tree noted. Critical care medicine consulted for management and placed on low-dose Neo-Synephrine infusion. Extubated on 4/23. 2D echo with EF of approximately 60 to 65% with trivial MVR. LDL 96, hemoglobin A1c 6.3%. He was started on heparin subcutaneously for VTE prophylaxis. The patient was maintained on aspirin 81 mg daily prior to admission and was started on Plavix and aspirin restarted. Recommend aspirin and Plavix for 3 weeks then Plavix alone. Cleared for oral diet and therapy evaluations carried out. He underwent TCD with bubble study on 4/24. Bilateral lower extremity venous duplex negative for DVT. His transcranial Doppler was significant for Spencer grade 5 patent foramen ovale. EP consult obtained on 4/25 and TEE performed. Mod I bed mobility, supervision transfers, and min guard assist amb 300' without AD. Pt continues to demo R drift during gait with decreased awareness of obstacles on R. Pt following simple commands 60% of trials. The patient requires inpatient medicine and rehabilitation evaluations and services for ongoing dysfunction  secondary to left frontal and left anterior insula acute cortical infarcts with additional small left parietal infarct.    Skilled Therapeutic Interventions          Pt seen for  skilled SLP speech and language evaluation. Evaluation conducted with informal measures. Due to the severity of pt's aphasia, pt unable to complete formal aphasia assessment at this time.   Pt presents with expressive and receptive aphasia most consistent with global aphasia in addition to suspicion for severe apraxia of speech.   Expressive language deficits characterized by effortful verbalizations that were often unintelligible an jargon-like. Pt able to complete simple automatic sequences such as counting 1-10 and a portion of days of the week when given max cues to initiate sequence. He is able to complete repetition tasks with good approximation at the word level. Pt often observed to perseverate on prior target and needed mod cues to discontinue perseveration. Concerns for apraxia of speech included inconsistent speech sound errors, difficulty initiating speech, speech distortions, and oral groping.   Receptive language deficits characterized by reduced visual and auditory comprehension of basic commands and single words. Comprehension slightly improved when command was paired with gesture or direct demonstration. Pt often says "yes" to all questions, therefore, unable to gauge comprehension of Y/N questions. Pt's wife reported f=2 choice has been helpful for determining pt's basic needs.   Pt left sitting upright in bed with wife at bedside and bed alarm activated. All needs in reach. Continue SLP PoC.    SLP Assessment  Patient will need skilled Speech Lanaguage Pathology Services during CIR admission    Recommendations  SLP Diet Recommendations: Age appropriate regular solids;Thin (continue current diet) Medication Administration: Whole meds with liquid Oral Care Recommendations: Oral care BID Patient destination: Home Follow up Recommendations: Outpatient SLP Equipment Recommended: None recommended by SLP    SLP Frequency 1 to 3 out of 7 days   SLP Duration  SLP Intensity  SLP  Treatment/Interventions 5-7 days  Minumum of 1-2 x/day, 30 to 90 minutes   Receptive, expressive language, Motor speech    Pain Pain Assessment Pain Scale: 0-10 Pain Score: 0-No pain  Prior Functioning Cognitive/Linguistic Baseline: Within functional limits Type of Home: House  Lives With: Spouse;Son;Daughter Available Help at Discharge: Family;Available 24 hours/day Vocation: Full time employment  SLP Evaluation Cognition Overall Cognitive Status: Difficult to assess Arousal/Alertness: Awake/alert Orientation Level:  (unable to assess due to global aphasia) Attention: Selective Selective Attention: Appears intact Memory: Appears intact Awareness: Appears intact Problem Solving: Appears intact Executive Function:  (diffiuclt to assess d/t aphaisa, will continue to evaluate during hospial stay) Safety/Judgment: Impaired (VB cues required to sit during LB dressing)  Comprehension Auditory Comprehension Overall Auditory Comprehension: Impaired Yes/No Questions: Impaired Basic Biographical Questions: 0-25% accurate Basic Immediate Environment Questions: 0-24% accurate Commands: Impaired One Step Basic Commands: 25-49% accurate Conversation: Simple Interfering Components: Motor planning EffectiveTechniques: Pausing;Visual/Gestural cues Visual Recognition/Discrimination Common Objects: Unable to indentify Reading Comprehension Reading Status: Not tested Expression Expression Primary Mode of Expression: Verbal Verbal Expression Overall Verbal Expression: Impaired Automatic Speech: Name;Counting;Day of week;Social Response Level of Generative/Spontaneous Verbalization: Word Repetition: Impaired Level of Impairment: Word level;Phrase level Naming: Impairment Responsive: 0-25% accurate Confrontation: Impaired Common Objects: Unable to indentify Verbal Errors: Perseveration;Phonemic paraphasias Pragmatics: No impairment Effective Techniques: Phonemic cues;Sentence  completion;Articulatory cues Non-Verbal Means of Communication: Gestures Written Expression Dominant Hand: Right Written Expression: Not tested Oral Motor Motor Speech Overall Motor Speech: Impaired Respiration: Within functional limits Resonance: Within functional limits Articulation: Impaired (  suspect apraxia of speech vs dysarthria) Level of Impairment: Word Motor Planning: Impaired Level of Impairment: Word Motor Speech Errors: Groping for words  Care Tool Care Tool Cognition Ability to hear (with hearing aid or hearing appliances if normally used Ability to hear (with hearing aid or hearing appliances if normally used): 0. Adequate - no difficulty in normal conservation, social interaction, listening to TV   Expression of Ideas and Wants Expression of Ideas and Wants: 1. Rarely/Never expressess or very difficult - rarely/never expresses self or speech is very difficult to understand   Understanding Verbal and Non-Verbal Content Understanding Verbal and Non-Verbal Content: 2. Sometimes understands - understands only basic conversations or simple, direct phrases. Frequently requires cues to understand  Memory/Recall Ability Memory/Recall Ability :  (unable to assess due to aphasia)   Swallow screen:  Pt's wife reported good tolerance of regular consistency diet and thin liquids. She denied need for formal clinical swallow assessment at this time. Clinical swallow assessment completed on 08/17/22 in acute care with recommendation of regular diet and thin liquids.   Short Term Goals: Week 1: SLP Short Term Goal 1 (Week 1): STG=LTGs due to ELOS  Refer to Care Plan for Long Term Goals  Recommendations for other services: None   Discharge Criteria: Patient will be discharged from SLP if patient refuses treatment 3 consecutive times without medical reason, if treatment goals not met, if there is a change in medical status, if patient makes no progress towards goals or if patient is  discharged from hospital.  The above assessment, treatment plan, treatment alternatives and goals were discussed and mutually agreed upon: by patient and by family  Ellery Plunk 08/19/2022, 2:23 PM

## 2022-08-19 NOTE — Progress Notes (Addendum)
Cell phone at bedside ( loop recorder app).   Tilden Dome, LPN

## 2022-08-19 NOTE — Progress Notes (Signed)
Inpatient Rehabilitation Care Coordinator Assessment and Plan Patient Details  Name: [name] MRN: [id number] Date of Birth: 02-23-1966  Today's Date: 08/19/2022  Hospital Problems: Principal Problem:   Acute ischemic left MCA stroke [facility])  Past Medical History:  Past Medical History:  Diagnosis Date   Hypertension    LVH (left ventricular hypertrophy)    Myocardial infarction (HCC)    Vitamin D deficiency    Past Surgical History:  Past Surgical History:  Procedure Laterality Date   ANTERIOR CRUCIATE LIGAMENT REPAIR     COLONOSCOPY WITH PROPOFOL N/A 04/11/2018   Procedure: COLONOSCOPY WITH PROPOFOL;  Surgeon: Toledo, Boykin Nearing, MD;  Location: [facility] ENDOSCOPY;  Service: Gastroenterology;  Laterality: N/A;   GASTRIC RESECTION     IR ANGIO INTRA EXTRACRAN SEL INTERNAL CAROTID UNI L MOD SED  08/15/2022   IR US GUIDE VASC ACCESS RIGHT  08/15/2022   LAPAROTOMY     LOOP RECORDER INSERTION N/A 08/18/2022   Procedure: LOOP RECORDER INSERTION;  Surgeon: Regan Lemming, MD;  Location: [facility] INVASIVE CV LAB;  Service: Cardiovascular;  Laterality: N/A;   RADIOLOGY WITH ANESTHESIA N/A 08/14/2022   Procedure: IR WITH ANESTHESIA;  Surgeon: Julieanne Cotton, MD;  Location: [facility] OR;  Service: Radiology;  Laterality: N/A;   TEE WITHOUT CARDIOVERSION N/A 08/18/2022   Procedure: TRANSESOPHAGEAL ECHOCARDIOGRAM;  Surgeon: [name], MD;  Location: [facility] INVASIVE CV LAB;  Service: Cardiovascular;  Laterality: N/A;   Social History:  reports that he has an unknown smoking status. He has never used smokeless tobacco. He reports that he does not currently use alcohol. He reports that he does not currently use drugs.  Family / Support Systems Patient Roles: Spouse Spouse/Significant Other: Lynden Ang Children: Deja, daughter Other Supports: N/A Anticipated Caregiver: Spouse and daughter. 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[employer], counseling and coach Return to Work Plans: TBD Marine scientist Issues: N/A Guardian/Conservator: Occupational hygienist   Abuse/Neglect Abuse/Neglect Assessment Can Be Completed: Yes (Spouse and daughter at bedside) Physical Abuse: Denies Verbal Abuse: Denies Sexual Abuse: Denies Exploitation of patient/patient's resources: Denies Self-Neglect: Denies  Patient response to: Social Isolation - How often do you feel lonely or isolated from those around you?: Patient unable to respond  Emotional Status Recent Psychosocial Issues: coping Psychiatric History: N/A Substance Abuse History: N/A  Patient / Family Perceptions, Expectations & Goals Pt/Family understanding of illness & functional limitations: yes Premorbid pt/family roles/activities: Independent Anticipated changes in roles/activities/participation: Spouse and daughter able to assist at d/c Pt/family expectations/goals: Supervision to MOD I  [employer]: None Premorbid Home Care/DME Agencies: None Transportation available at discharge: spouse Is the patient able to respond to transportation needs?: No Resource referrals recommended: Neuropsychology  Discharge Planning Living Arrangements: Spouse/significant other, Children Support Systems: Spouse/significant other, Children Type of Residence: Private residence (2 level home. 3 steps to enter) Insurance Resources: Media planner (specify) Financial Resources: Employment Financial Screen Referred: No Living  Expenses: Psychologist, sport and exercise Management: Patient, Spouse Does the patient have any problems obtaining your medications?: No Home Management: Independent Patient/Family Preliminary Plans: Family able to assist  Care Coordinator Barriers to Discharge: Decreased caregiver support, Insurance for SNF coverage Care Coordinator Anticipated Follow Up Needs: HH/OP Expected length of stay: 5-7 Days  Clinical Impression SW met with patient, spouse and daughter in room. Introduced self and explained role. Patient anticipates discharging home with assistance from spouse (anticipates [facility]), daughter and son. Patient and family live in a 2 level home, 3 steps to enter the home. Patient has no previously owned DME. No additional questions or concerns.   Andria Rhein 08/19/2022, 1:15 PM

## 2022-08-19 NOTE — Evaluation (Incomplete)
Physical Therapy Assessment and Plan  Patient Details  Name: [name] MRN: [id number] Date of Birth: [birth year]  PT Diagnosis: Coordination disorder and Impaired cognition Rehab Potential: Good ELOS: 7 days   Today's Date: 08/19/2022 PT Individual Time: 4098-1191 PT Individual Time Calculation (min): 74 min    Hospital Problem: Principal Problem:   Acute ischemic left MCA stroke [facility])   Past Medical History:  Past Medical History:  Diagnosis Date   Hypertension    LVH (left ventricular hypertrophy)    Myocardial infarction (HCC)    Vitamin D deficiency    Past Surgical History:  Past Surgical History:  Procedure Laterality Date   ANTERIOR CRUCIATE LIGAMENT REPAIR     COLONOSCOPY WITH PROPOFOL N/A 04/11/2018   Procedure: COLONOSCOPY WITH PROPOFOL;  Surgeon: Toledo, Boykin Nearing, MD;  Location: [facility] ENDOSCOPY;  Service: Gastroenterology;  Laterality: N/A;   GASTRIC RESECTION     IR ANGIO INTRA EXTRACRAN SEL INTERNAL CAROTID UNI L MOD SED  08/15/2022   IR US GUIDE VASC ACCESS RIGHT  08/15/2022   LAPAROTOMY     LOOP RECORDER INSERTION N/A 08/18/2022   Procedure: LOOP RECORDER INSERTION;  Surgeon: Regan Lemming, MD;  Location: [facility] INVASIVE CV LAB;  Service: Cardiovascular;  Laterality: N/A;   RADIOLOGY WITH ANESTHESIA N/A 08/14/2022   Procedure: IR WITH ANESTHESIA;  Surgeon: Julieanne Cotton, MD;  Location: [facility] OR;  Service: Radiology;  Laterality: N/A;   TEE WITHOUT CARDIOVERSION N/A 08/18/2022   Procedure: TRANSESOPHAGEAL ECHOCARDIOGRAM;  Surgeon: [name], MD;  Location: [facility] INVASIVE CV LAB;  Service: Cardiovascular;  Laterality: N/A;    Assessment & Plan Clinical Impression: Patient is a 57 y.o. ymale who presented to the [facility] ED on 08/14/2022 noted by wife to be very weak and confused and halfway enter out of the bathtub at home. Symptoms were sudden in onset. Last known well 9 PM. Patient presented with global aphasia. Code stroke activated. CT head negative for  hemorrhage, aspects of 9, mild hyperdensity left MCA could indicate thrombus. Tenecteplase administered. CTA showed left M1 occlusion and neurointerventional radiology contacted. Patient transferred to [facility] for thrombectomy. On route he was noted to have periorbital welts and uvular swelling. Was concern of possible angioedema secondary to tenecteplase. He was intubated for airway protection and underwent cerebral angiogram by Dr. Tommie Sams. Recanalization of the left MCA vascular tree noted. Critical care medicine consulted for management and placed on low-dose Neo-Synephrine infusion. Extubated on 4/23. 2D echo with EF of approximately 60 to 65% with trivial MVR. LDL 96, hemoglobin A1c 6.3%. He was started on heparin subcutaneously for VTE prophylaxis. The patient was maintained on aspirin 81 mg daily prior to admission and was started on Plavix and aspirin restarted. Recommend aspirin and Plavix for 3 weeks then Plavix alone. Cleared for oral diet and therapy evaluations carried out. He underwent TCD with bubble study on 4/24. Bilateral lower extremity venous duplex negative for DVT. His transcranial Doppler was significant for Spencer grade 5 patent foramen ovale. EP consult obtained on 4/25 and TEE performed. Mod I bed mobility, supervision transfers, and min guard assist amb 300' without AD. Pt continues to demo R drift during gait with decreased awareness of obstacles on R. Pt following simple commands 60% of trials. 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   Patient transferred to CIR on 08/18/2022 .  Patient currently requires supervision assist with mobility secondary to decreased coordination, ,, decreased problem solving, decreased memory, and delayed processing, and , .  Prior to hospitalization,  patient was independent  with mobility and lived with Spouse, Son, Daughter in a House home.  Home access is 3Stairs to enter.  Patient will benefit from skilled PT intervention to maximize safe functional mobility, minimize fall risk, and decrease caregiver burden for planned discharge home with 24 hour supervision.  Anticipate patient will benefit from follow up HH at discharge.  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) PT Patient demonstrates impairments in the following area(s): Motor;Safety;Perception PT Transfers Functional Problem(s): Bed to Chair;Furniture PT Locomotion Functional Problem(s): Ambulation;Stairs PT Plan PT Intensity: Minimum of 1-2 x/day ,45 to 90 minutes PT Frequency: 5 out of 7 days PT Duration Estimated Length of Stay: 7 days PT Treatment/Interventions: Ambulation/gait training;Community reintegration;Neuromuscular re-education;Psychosocial support;Stair training;UE/LE Strength taining/ROM;Balance/vestibular training;Discharge planning;Therapeutic Activities;UE/LE Coordination activities;Functional mobility training;Patient/family education;Therapeutic Exercise PT Transfers Anticipated Outcome(s): IND PT Locomotion Anticipated Outcome(s): IND PT Recommendation Follow Up Recommendations: 24 hour supervision/assistance Patient destination: Home Equipment Recommended: None recommended by PT   PT Evaluation Precautions/Restrictions Precautions Precautions: Fall;Other (comment) Precaution Comments: 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 Does not apply - I have not had any pain or hurting in the past 5 days Pain Interference with Therapy Activities: 0. Does not apply - I have not received rehabilitationtherapy in the past 5 days Pain Interference with Day-to-Day Activities: 1. Rarely or not at all Home Living/Prior Functioning Home Living Available Help at Discharge: Family;Available 24 hours/day Type of Home: House Home Access: Stairs to enter [employer] of Steps: 3 Entrance Stairs-Rails: Can reach both Home Layout: Two level;Able to live on main level with bedroom/bathroom Alternate Level Stairs-Number of Steps: 16 Bathroom Shower/Tub: Engineer, manufacturing systems: Standard Bathroom Accessibility: Yes  Lives With: Spouse;Son;Daughter Prior Function Level of Independence: Independent with basic ADLs;Independent with homemaking with ambulation  Able to Take Stairs?: Yes Driving: Yes Vocation: Full time employment 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; 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 Vision/Perception  Vision - History Ability to See in Adequate Light: 0 Adequate Vision - Assessment Eye Alignment: Within Functional Limits Ocular Range of Motion: Within Functional Limits Alignment/Gaze Preference: Within Defined Limits Tracking/Visual Pursuits: Able to track stimulus in all quads without difficulty Convergence: Within functional limits Perception Perception: Within Functional Limits Praxis Praxis: Impaired Praxis-Other Comments: rote activites with no apparent apraxia; pt unable to follow verbal instructions for new tasks and requires modeling to perform  Cognition Overall Cognitive Status: Difficult to assess Arousal/Alertness: Awake/alert Orientation Level: Oriented to person Attention: Selective;Focused Focused Attention: Impaired Sustained Attention:  Impaired Memory: Appears intact Awareness: Appears intact Problem Solving: Appears intact Executive Function:  (diffiuclt to assess d/t aphaisa, will continue to evaluate during hospial stay) Safety/Judgment: Appears intact Sensation Sensation Light Touch: Appears Intact Coordination Gross Motor Movements are Fluid and Coordinated: Yes Fine Motor Movements are Fluid and Coordinated: Yes Heel Shin Test: unable to perform with vc only. Then able to mimic with modleing of therapist and  perform [facility] Motor  Motor Motor: Within Functional Limits   Trunk/Postural Assessment  Cervical Assessment Cervical Assessment: Within Functional Limits Thoracic Assessment Thoracic Assessment: Within Functional Limits Lumbar Assessment Lumbar Assessment: Within Functional Limits Postural Control Postural Control: Within Functional Limits  Balance Balance Balance Assessed: Yes Static Sitting Balance Static Sitting - Balance Support: Feet supported Static Sitting - Level of Assistance: 7: Independent Dynamic Sitting Balance Dynamic Sitting - Balance Support: No upper extremity supported;Feet supported Dynamic Sitting - Level of Assistance: 5: Stand by assistance Static Standing Balance Static Standing - Balance Support: During functional activity Static Standing - Level of Assistance: 5: Stand by assistance Dynamic Standing Balance Dynamic Standing - Balance Support: During functional activity Dynamic Standing - Level of Assistance: 5: Stand by assistance;Other (comment) (CGA) Extremity Assessment      RLE Assessment RLE Assessment: Within Functional Limits LLE Assessment LLE Assessment: Within Functional Limits  Care Tool Care Tool Bed Mobility Roll left and right activity   Roll left and right assist level: Supervision/Verbal cueing    Sit to lying activity   Sit to lying assist level: Supervision/Verbal cueing    Lying to sitting on side of bed activity   Lying to sitting on side  of bed assist level: the ability to move from lying on the back to sitting on the side of the bed with no back support.: Supervision/Verbal cueing     Care Tool Transfers Sit to stand transfer   Sit to stand assist level: Supervision/Verbal cueing    Chair/bed transfer   Chair/bed transfer assist level: Supervision/Verbal cueing     Toilet transfer   Assist Level: Supervision/Verbal cueing    Car transfer   Car transfer assist level: Supervision/Verbal cueing      Care Tool Locomotion Ambulation   Assist level: Contact Guard/Touching assist Assistive device: No Device Max distance: 400 ft  Walk 10 feet activity   Assist level: Supervision/Verbal cueing Assistive device: No Device   Walk 50 feet with 2 turns activity   Assist level: Contact Guard/Touching assist Assistive device: No Device  Walk 150 feet activity   Assist level: Contact Guard/Touching assist Assistive device: No Device  Walk 10 feet on uneven surfaces activity   Assist level: Contact Guard/Touching assist Assistive device: Other (comment) (no device)  Stairs   Assist level: Contact Guard/Touching assist Stairs assistive device: 2 hand rails Max number of stairs: 20  Walk up/down 1 step activity   Walk up/down 1 step (curb) assist level: Supervision/Verbal cueing Walk up/down 1 step or curb assistive device: No device  Walk up/down 4 steps activity   Walk up/down 4 steps assist level: Supervision/Verbal cueing Walk up/down 4 steps assistive device: 2 hand rails  Walk up/down 12 steps activity   Walk up/down 12 steps assist level: Contact Guard/Touching assist Walk up/down 12 steps assistive device: 2 hand rails  Pick up small objects from floor   Pick up small object from the floor assist level: Supervision/Verbal cueing    Wheelchair Is the patient using a wheelchair?: No   Wheelchair activity did not occur: N/A      Wheel 50 feet with 2 turns activity Wheelchair 50 feet with 2 turns activity  did not occur: N/A    Wheel 150 feet activity Wheelchair 150 feet activity did not occur: N/A      Refer to Care Plan for Long Term Goals  SHORT TERM GOAL WEEK 1 PT Short Term Goal 1 (Week 1): STG = LTG d/t ELOS  Recommendations for other services: None   Skilled Therapeutic Intervention Mobility Bed Mobility Bed Mobility: Rolling Right;Rolling Left;Right Sidelying to Sit Rolling Right: Supervision/verbal cueing Rolling Left: Supervision/Verbal cueing Right Sidelying to Sit: Supervision/Verbal cueing Transfers Transfers: Sit to Stand;Stand to Sit;Stand Pivot Transfers Sit to Stand: Supervision/Verbal cueing Stand to Sit: Supervision/Verbal cueing Stand Pivot Transfers: Supervision/Verbal cueing;Contact Guard/Touching assist Transfer (Assistive device): None Locomotion  Gait Ambulation: Yes Gait Distance (Feet): 400 Feet Assistive device: None Gait Gait: Yes Gait Pattern: Within Functional Limits Gait velocity: mildly decreased for age 50 / Additional Locomotion Stairs: Yes Stairs Assistance: Supervision/Verbal cueing;Contact Guard/Touching assist Stair Management Technique: Two rails Number of Stairs: 20 Height of Stairs: 6 Ramp: Supervision/Verbal cueing Curb: Supervision/Verbal cueing Wheelchair Mobility Wheelchair Mobility: No  Skilled Intervention: PT Evaluation completed; see above for results. PT educated patient in roles of PT vs OT, PT POC, rehab potential, rehab goals, and discharge recommendations along with recommendation for follow-up rehabilitation services. Individual treatment initiated:  Patient completing toileting with wife providing supervision upon PT arrival. Patient alert and agreeable to PT session. No pain complaint during session. D/t difficulty with speaking, pt demonstrates wide opening of his mouth intermittently as though he is working through numbness after a dental visit.   Therapeutic Activity: Pt is able to perform bed mobility  and transfers with distant supervision. With rote activity, he follows instructions well. With more novel activities, he is unable to perform with verbal cueing only and does require visual demonstration in order to model.   Gait Training:  Patient ambulated community distances around department including climbing stairwell to 5th floor and back down to 4th floor with supervision. Although he doesn't appear to require it, he does reach for both handrails on ascent. One HR for descent. With head turns, pt does demo some mild adjustment in speed and path.   During distance gait, pt asked if he wants to return home soon and pt responds perfectly with "you know it".   Relates that he has a tall vehicle, possibly a truck. Performs car transfer in/ out of a tall vehicle with distant supervision.   Neuromuscular Re-ed: NMR facilitated during session with focus on producing speech, balance, following instruction. Pt guided in stance on lateral balance board. He quickly finds balance and is able to reach cones on L and pass to R hand to place on table to R without LOB. With introduction of grasping called color, pt is unable to determine any color correctly. Is able to match color with vc and minimal visual demonstration. Pt demo'd extreme difficulty in following instructions to step off of balance board. Therapist holds board to assist with stepping off but pt unable to follow instructions to step off. Instead demos ability to stand on one LE with arms out and contralateral leg outstretched posteriorly. Requires repositioning of therapist in front of pt and facilitation of forward weight shift in order to promote step forward to floor.    BITS used for attempt to sequence alphabet when locating letters on screen, but pt is unable to complete past "C" without vc for next letter and only finds ~75% of called letters correctly. Performed second time on [employer] with similar results. No LOB noted.   Performed  retrieval of single cone from seated position on bed with cones laid behind pt. Grabs one at a time, unable to state color, but is able to search room for matching color dot and place on dot. With repeat of word after therapist stating, pt is  able to attempt and comes close with all. Demos difficulty with "l" sound in blue. With description of candy sucker, instructed pt to repeat "lollipop" and is able to perfectly state.   Pt also noted to demo verbal perseveration when he identifies his basketball. Is able to enunciate "basketball" with emphasis but then answers the next following questions with "basketball" until corrected.   Demos bounce pass, chest pass with vc only, then vc/ visual cues for around the body pass of ball from hand to hand, then figure 8 around BLE. No LOB.  NMR performed for improvements in motor control and coordination, balance, sequencing, judgement, and self confidence/ efficacy in performing all aspects of mobility at highest level of independence.   Patient supine  in bed at end of session with brakes locked, bed alarm set, and all needs within reach. Dtr and wife are cleared to supervise pt to bathroom - showed dtr how to turn off bed alarm when pt needs to toilet.    Discharge Criteria: Patient will be discharged from PT if patient refuses treatment 3 consecutive times without medical reason, if treatment goals not met, if there is a change in medical status, if patient makes no progress towards goals or if patient is discharged from hospital.  The above assessment, treatment plan, treatment alternatives and goals were discussed and mutually agreed upon: by patient and by family  Loel Dubonnet PT, DPT, CSRS 08/19/2022, 7:04 PM

## 2022-08-19 NOTE — Progress Notes (Signed)
Inpatient Rehabilitation  Patient information reviewed and entered into eRehab system by Stacyann Mcconaughy M. Kamarri Fischetti, M.A., CCC/SLP, PPS Coordinator.  Information including medical coding, functional ability and quality indicators will be reviewed and updated through discharge.    

## 2022-08-19 NOTE — IPOC Note (Signed)
Overall Plan of Care [facility]) Patient Details Name: [name] MRN: [id number] DOB: July 06, 1965  Admitting Diagnosis: Acute ischemic left MCA stroke [facility])  Hospital Problems: Principal Problem:   Acute ischemic left MCA stroke [facility])     Functional Problem List: Nursing Bowel, Safety, Medication Management, Endurance, Pain  PT Motor, Safety, Perception  OT Safety, Balance, Cognition, Skin Integrity, Motor, Perception  SLP Linguistic, Motor  TR         Basic ADL's: OT Bathing, Dressing, Toileting, Grooming     Advanced  ADL's: OT Laundry, Light Housekeeping, Simple Meal Preparation     Transfers: PT Bed to Chair, Furniture  OT Tub/Shower     Locomotion: PT Ambulation, Stairs     Additional Impairments: OT    SLP Communication comprehension, expression    TR      Anticipated Outcomes Item Anticipated Outcome  Self Feeding Independent  Swallowing      Basic self-care  Independent  Toileting  Independent   Bathroom Transfers Independent  Bowel/Bladder  manage bowel w mod I assist  Transfers  IND  Locomotion  IND  Communication  Min assist  Cognition     Pain  < 4 with prns  Safety/Judgment  manage w cues   Therapy Plan: PT Intensity: Minimum of 1-2 x/day ,45 to 90 minutes PT Frequency: 5 out of 7 days PT Duration Estimated Length of Stay: 7 days OT Intensity: Minimum of 1-2 x/day, 45 to 90 minutes OT Frequency: 5 out of 7 days OT Duration/Estimated Length of Stay: 5-7 days SLP Intensity: Minumum of 1-2 x/day, 30 to 90 minutes SLP Frequency: 1 to 3 out of 7 days SLP Duration/Estimated Length of Stay: 5-7 days   Team Interventions: Nursing Interventions Patient/Family Education, Bowel Management, Pain Management, Bladder Management, Disease Management/Prevention, Medication Management, Discharge Planning  PT interventions Ambulation/gait training, Community reintegration, Neuromuscular re-education, Psychosocial support, Stair training, UE/LE  Strength taining/ROM, Warden/ranger, Discharge planning, Therapeutic Activities, UE/LE Coordination activities, Functional mobility training, Patient/family education, Therapeutic Exercise  OT Interventions Balance/vestibular training, Discharge planning, Pain management, Self Care/advanced ADL retraining, Therapeutic Activities, UE/LE Coordination activities, Cognitive remediation/compensation, Functional mobility training, Patient/family education, Skin care/wound managment, Therapeutic Exercise, Visual/perceptual remediation/compensation, Community reintegration, Fish farm manager, Neuromuscular re-education, Psychosocial support, UE/LE Strength taining/ROM  SLP Interventions    TR Interventions    SW/CM Interventions Discharge Planning, Psychosocial Support, Patient/Family Education, Disease Management/Prevention   Barriers to Discharge MD   Aphasia, labile BP  Nursing Decreased caregiver support, Home environment access/layout 2 level main B+B, 3ste bil rails w wife  PT Home environment access/layout, Incontinence, Insurance for SNF coverage, Other (comments) (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)    OT      SLP      SW Decreased caregiver support, Community education officer for SNF coverage     Team Discharge Planning: Destination: PT-Home ,OT- Home , SLP-Home Projected Follow-up: PT-24 hour supervision/assistance, OT-  None, SLP-Outpatient SLP Projected Equipment Needs: PT-None recommended by PT, OT- To be determined, SLP-None recommended by SLP Equipment Details: PT- , OT-  Patient/family involved in discharge planning: PT- Family member/caregiver,  OT-Patient, Family member/caregiver, SLP-Patient, Family member/caregiver  MD ELOS: 5-7d Medical Rehab Prognosis:  Good Assessment: The patient has been admitted for CIR therapies with the diagnosis of Left MCA infarct . The team will be addressing functional mobility, strength, stamina, balance, safety, adaptive techniques and  equipment, self-care, bowel and bladder mgt, patient and caregiver education, Aphasia. Goals have been set at Mod I. Anticipated discharge destination is Home .  See Team Conference Notes for weekly updates to the plan of care

## 2022-08-20 DIAGNOSIS — I63512 Cerebral infarction due to unspecified occlusion or stenosis of left middle cerebral artery: Secondary | ICD-10-CM | POA: Diagnosis not present

## 2022-08-20 LAB — GLUCOSE, CAPILLARY
Glucose-Capillary: 120 mg/dL — ABNORMAL HIGH (ref 70–99)
Glucose-Capillary: 107 mg/dL — ABNORMAL HIGH (ref 70–99)
Glucose-Capillary: 103 mg/dL — ABNORMAL HIGH (ref 70–99)
Glucose-Capillary: 130 mg/dL — ABNORMAL HIGH (ref 70–99)

## 2022-08-20 NOTE — Plan of Care (Signed)
  Problem: RH Bed to Chair Transfers Goal: LTG Patient will perform bed/chair transfers w/assist (PT) Description: LTG: Patient will perform bed to chair transfers with assistance (PT). Flowsheets (Taken 08/19/2022 1827) LTG: Pt will perform Bed to Chair Transfers with assistance level: Independent   Problem: RH Car Transfers Goal: LTG Patient will perform car transfers with assist (PT) Description: LTG: Patient will perform car transfers with assistance (PT). Flowsheets (Taken 08/19/2022 1827) LTG: Pt will perform car transfers with assist:: Independent   Problem: RH Furniture Transfers Goal: LTG Patient will perform furniture transfers w/assist (OT/PT) Description: LTG: Patient will perform furniture transfers  with assistance (OT/PT). Flowsheets (Taken 08/20/2022 1327) LTG: Pt will perform furniture transfers with assist:: Independent   Problem: RH Ambulation Goal: LTG Patient will ambulate in community environment (PT) Description: LTG: Patient will ambulate in community environment, # of feet with assistance (PT). Flowsheets (Taken 08/19/2022 1827) LTG: Pt will ambulate in community environ  assist needed:: Independent LTG: Ambulation distance in community environment: >1000 ft with no AD over all surfaces   Problem: RH Stairs Goal: LTG Patient will ambulate up and down stairs w/assist (PT) Description: LTG: Patient will ambulate up and down # of stairs with assistance (PT) Flowsheets (Taken 08/19/2022 1827) LTG: Pt will ambulate up/down stairs assist needed:: Independent LTG: Pt will  ambulate up and down number of stairs: at least 16 without use of HR   Problem: RH Pre-functional/Other (Specify) Goal: RH LTG PT (Specify) 1 Description:  RH LTG PT (Specify) 1 Flowsheets (Taken 08/19/2022 1827) LTG: Other PT (Specify) 1: Pt will demonstrate dynamic balance with IND during dual task activities with vc and 50% visual demonstration required. Goal: RH LTG PT (Specify) 2 Description:  RH LTG PT (Specify) 2 Flowsheets (Taken 08/19/2022 1827) LTG: Other PT (Specify) 2: Pt will demonstrate ability to perform novel tasks correctly with vc and <50% need for visual demonstration.

## 2022-08-20 NOTE — Progress Notes (Signed)
Physical Therapy Session Note  Patient Details  Name: [name] MRN: [id number] Date of Birth: 1965-05-23  Today's Date: 08/20/2022 PT Individual Time: 0807-0903 PT Individual Time Calculation (min): 56 min   Short Term Goals: Week 1:  PT Short Term Goal 1 (Week 1): STG = LTG d/t ELOS  Skilled Therapeutic Interventions/Progress Updates:    Pt received long sitting in bed with his wife, [name], present and pt agreeable to therapy session. Transitioned to sitting EOB mod-I. Sit<>stands with distant supervision progressed to independent throughout session. Gait training >1,041ft throughout CIR, no AD, with distant supervision. Participated in dynamic balance and dual-task of shooting a basketball from numbered 2, 3, 4 targets on the ground with pt having to say the number prior to shooting - pt able to retrieve his rebound with supervision. Reviewed the numbers prior to adding the dual-task and pt frequently requires therapist to say the number before him to improve accuracy otherwise he perseverates on the wrong words such as "forty-five." Transitioned to gait training in ADL apartment while participating in dual-task of naming familiar objects such as fork, spoon, bowl, and plate with pt having a difficult time making "s" and "p" sounds, often starting with an "f" sound instead as well as adding a "d" sound at end of "bowl" and having challenge with "k" or "c" sounds. Therapist wrote the words down and would say the word for him to help motor plan how to say it correctly - eventually was able to say "fork" and "plate" correctly but not successful with the others. Dynamic gait training task of forward fast walking, backwards walking, and side stepping at least 73ft of each with supervision and 1x slight instability when side stepping towards R. At end of session, pt left sitting EOB with his wife and MD present for morning assessment.  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   Therapy/Group: Individual Therapy  Ginny Forth , PT, DPT, NCS, CSRS 08/20/2022, 7:48 AM

## 2022-08-20 NOTE — Evaluation (Incomplete)
Physical Therapy Assessment and Plan  Patient Details  Name: [name] MRN: [id number] Date of Birth: 1965-06-29  PT Diagnosis: Coordination disorder and Impaired cognition Rehab Potential: Good ELOS: 7 days   Today's Date: 08/19/2022 PT Individual Time: 4098-1191 PT Individual Time Calculation (min): 74 min    Hospital Problem: Principal Problem:   Acute ischemic left MCA stroke [facility])   Past Medical History:  Past Medical History:  Diagnosis Date  . Hypertension   . LVH (left ventricular hypertrophy)   . Myocardial infarction (HCC)   . Vitamin D deficiency    Past Surgical History:  Past Surgical History:  Procedure Laterality Date  . ANTERIOR CRUCIATE LIGAMENT REPAIR    . COLONOSCOPY WITH PROPOFOL N/A 04/11/2018   Procedure: COLONOSCOPY WITH PROPOFOL;  Surgeon: Toledo, Boykin Nearing, MD;  Location: [facility] ENDOSCOPY;  Service: Gastroenterology;  Laterality: N/A;  . GASTRIC RESECTION    . IR ANGIO INTRA EXTRACRAN SEL INTERNAL CAROTID UNI L MOD SED  08/15/2022  . IR US GUIDE VASC ACCESS RIGHT  08/15/2022  . LAPAROTOMY    . LOOP RECORDER INSERTION N/A 08/18/2022   Procedure: LOOP RECORDER INSERTION;  Surgeon: Regan Lemming, MD;  Location: [facility] INVASIVE CV LAB;  Service: Cardiovascular;  Laterality: N/A;  . RADIOLOGY WITH ANESTHESIA N/A 08/14/2022   Procedure: IR WITH ANESTHESIA;  Surgeon: Julieanne Cotton, MD;  Location: [facility] OR;  Service: Radiology;  Laterality: N/A;  . TEE WITHOUT CARDIOVERSION N/A 08/18/2022   Procedure: TRANSESOPHAGEAL ECHOCARDIOGRAM;  Surgeon: [name], MD;  Location: [facility] INVASIVE CV LAB;  Service: Cardiovascular;  Laterality: N/A;    Assessment & Plan Clinical Impression: Patient is a 57 y.o. ymale who presented to the [facility] ED on 08/14/2022 noted by wife to be very weak and confused and halfway enter out of the bathtub at home. Symptoms were sudden in onset. Last known well 9 PM. Patient presented with global aphasia. Code stroke activated. CT head  negative for hemorrhage, aspects of 9, mild hyperdensity left MCA could indicate thrombus. Tenecteplase administered. CTA showed left M1 occlusion and neurointerventional radiology contacted. Patient transferred to [facility] for thrombectomy. On route he was noted to have periorbital welts and uvular swelling. Was concern of possible angioedema secondary to tenecteplase. He was intubated for airway protection and underwent cerebral angiogram by Dr. Tommie Sams. Recanalization of the left MCA vascular tree noted. Critical care medicine consulted for management and placed on low-dose Neo-Synephrine infusion. Extubated on 4/23. 2D echo with EF of approximately 60 to 65% with trivial MVR. LDL 96, hemoglobin A1c 6.3%. He was started on heparin subcutaneously for VTE prophylaxis. The patient was maintained on aspirin 81 mg daily prior to admission and was started on Plavix and aspirin restarted. Recommend aspirin and Plavix for 3 weeks then Plavix alone. Cleared for oral diet and therapy evaluations carried out. He underwent TCD with bubble study on 4/24. Bilateral lower extremity venous duplex negative for DVT. His transcranial Doppler was significant for Spencer grade 5 patent foramen ovale. EP consult obtained on 4/25 and TEE performed. Mod I bed mobility, supervision transfers, and min guard assist amb 300' without AD. Pt continues to demo R drift during gait with decreased awareness of obstacles on R. Pt following simple commands 60% of trials. 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   Patient transferred to CIR on 08/18/2022 .  Patient currently requires supervision assist with mobility secondary to decreased coordination, ,, decreased problem solving, decreased memory, and delayed processing, and , .  Prior to  hospitalization, patient was independent  with mobility and lived with Spouse, Son, Daughter in a House home.  Home access is 3Stairs to enter.  Patient will benefit from skilled PT intervention to maximize safe functional mobility, minimize fall risk, and decrease caregiver burden for planned discharge home with 24 hour supervision.  Anticipate patient will benefit from follow up HH at discharge.  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) PT Patient demonstrates impairments in the following area(s): Motor;Safety;Perception PT Transfers Functional Problem(s): Bed to Chair;Furniture PT Locomotion Functional Problem(s): Ambulation;Stairs PT Plan PT Intensity: Minimum of 1-2 x/day ,45 to 90 minutes PT Frequency: 5 out of 7 days PT Duration Estimated Length of Stay: 7 days PT Treatment/Interventions: Ambulation/gait training;Community reintegration;Neuromuscular re-education;Psychosocial support;Stair training;UE/LE Strength taining/ROM;Balance/vestibular training;Discharge planning;Therapeutic Activities;UE/LE Coordination activities;Functional mobility training;Patient/family education;Therapeutic Exercise PT Transfers Anticipated Outcome(s): IND PT Locomotion Anticipated Outcome(s): IND PT Recommendation Follow Up Recommendations: 24 hour supervision/assistance Patient destination: Home Equipment Recommended: None recommended by PT   PT Evaluation Precautions/Restrictions Precautions Precautions: Fall;Other (comment) Precaution Comments: 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 Does not apply - I have not had any pain or hurting in the past 5 days Pain Interference with Therapy Activities: 0. Does not apply - I have not received rehabilitationtherapy in the past 5 days Pain Interference with Day-to-Day Activities: 1. Rarely or not at all Home Living/Prior Functioning Home Living Available Help at Discharge: Family;Available 24 hours/day Type of Home: House Home Access: Stairs to enter [employer] of Steps: 3 Entrance Stairs-Rails: Can reach both Home Layout: Two level;Able to live on main level with bedroom/bathroom Alternate Level Stairs-Number of Steps: 16 Bathroom Shower/Tub: Engineer, manufacturing systems: Standard Bathroom Accessibility: Yes  Lives With: Spouse;Son;Daughter Prior Function Level of Independence: Independent with basic ADLs;Independent with homemaking with ambulation  Able to Take Stairs?: Yes Driving: Yes Vocation: Full time employment 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; 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 Vision/Perception  Vision - History Ability to See in Adequate Light: 0 Adequate Vision - Assessment Eye Alignment: Within Functional Limits Ocular Range of Motion: Within Functional Limits Alignment/Gaze Preference: Within Defined Limits Tracking/Visual Pursuits: Able to track stimulus in all quads without difficulty Convergence: Within functional limits Perception Perception: Within Functional Limits Praxis Praxis: Impaired Praxis-Other Comments: rote activites with no apparent apraxia; pt unable to follow verbal instructions for new tasks and requires modeling to perform  Cognition Overall Cognitive Status: Difficult to assess Arousal/Alertness: Awake/alert Orientation Level: Oriented to person Attention: Selective;Focused Focused Attention: Impaired Sustained  Attention: Impaired Memory: Appears intact Awareness: Appears intact Problem Solving: Appears intact Executive Function:  (diffiuclt to assess d/t aphaisa, will continue to evaluate during hospial stay) Safety/Judgment: Appears intact Sensation Sensation Light Touch: Appears Intact Coordination Gross Motor Movements are Fluid and Coordinated: Yes Fine Motor Movements are Fluid and Coordinated: Yes Heel Shin Test: unable to perform with vc only. Then able to mimic with modleing of therapist and  perform [facility] Motor  Motor Motor: Within Functional Limits   Trunk/Postural Assessment  Cervical Assessment Cervical Assessment: Within Functional Limits Thoracic Assessment Thoracic Assessment: Within Functional Limits Lumbar Assessment Lumbar Assessment: Within Functional Limits Postural Control Postural Control: Within Functional Limits  Balance Balance Balance Assessed: Yes Static Sitting Balance Static Sitting - Balance Support: Feet supported Static Sitting - Level of Assistance: 7: Independent Dynamic Sitting Balance Dynamic Sitting - Balance Support: No upper extremity supported;Feet supported Dynamic Sitting - Level of Assistance: 5: Stand by assistance Static Standing Balance Static Standing - Balance Support: During functional activity Static Standing - Level of Assistance: 5: Stand by assistance Dynamic Standing Balance Dynamic Standing - Balance Support: During functional activity Dynamic Standing - Level of Assistance: 5: Stand by assistance;Other (comment) (CGA) Extremity Assessment      RLE Assessment RLE Assessment: Within Functional Limits LLE Assessment LLE Assessment: Within Functional Limits  Care Tool Care Tool Bed Mobility Roll left and right activity   Roll left and right assist level: Supervision/Verbal cueing    Sit to lying activity   Sit to lying assist level: Supervision/Verbal cueing    Lying to sitting on side of bed activity   Lying to  sitting on side of bed assist level: the ability to move from lying on the back to sitting on the side of the bed with no back support.: Supervision/Verbal cueing     Care Tool Transfers Sit to stand transfer   Sit to stand assist level: Supervision/Verbal cueing    Chair/bed transfer   Chair/bed transfer assist level: Supervision/Verbal cueing     Toilet transfer   Assist Level: Supervision/Verbal cueing    Car transfer   Car transfer assist level: Supervision/Verbal cueing      Care Tool Locomotion Ambulation   Assist level: Contact Guard/Touching assist Assistive device: No Device Max distance: 400 ft  Walk 10 feet activity   Assist level: Supervision/Verbal cueing Assistive device: No Device   Walk 50 feet with 2 turns activity   Assist level: Contact Guard/Touching assist Assistive device: No Device  Walk 150 feet activity   Assist level: Contact Guard/Touching assist Assistive device: No Device  Walk 10 feet on uneven surfaces activity   Assist level: Contact Guard/Touching assist Assistive device: Other (comment) (no device)  Stairs   Assist level: Contact Guard/Touching assist Stairs assistive device: 2 hand rails Max number of stairs: 20  Walk up/down 1 step activity   Walk up/down 1 step (curb) assist level: Supervision/Verbal cueing Walk up/down 1 step or curb assistive device: No device  Walk up/down 4 steps activity   Walk up/down 4 steps assist level: Supervision/Verbal cueing Walk up/down 4 steps assistive device: 2 hand rails  Walk up/down 12 steps activity   Walk up/down 12 steps assist level: Contact Guard/Touching assist Walk up/down 12 steps assistive device: 2 hand rails  Pick up small objects from floor   Pick up small object from the floor assist level: Supervision/Verbal cueing    Wheelchair Is the patient using a wheelchair?: No   Wheelchair activity did not occur: N/A      Wheel 50 feet with 2 turns activity Wheelchair 50 feet with 2  turns activity did not occur: N/A    Wheel 150 feet activity Wheelchair 150 feet activity did not occur: N/A      Refer to Care Plan for Long Term Goals  SHORT TERM GOAL WEEK 1 PT Short Term Goal 1 (Week 1): STG = LTG d/t ELOS  Recommendations for other services: None   Skilled Therapeutic Intervention Mobility Bed Mobility Bed Mobility: Rolling Right;Rolling Left;Right Sidelying to Sit Rolling Right: Supervision/verbal cueing Rolling Left: Supervision/Verbal cueing Right Sidelying to Sit: Supervision/Verbal cueing Transfers Transfers: Sit to Stand;Stand to Sit;Stand Pivot Transfers Sit to Stand: Supervision/Verbal cueing Stand to Sit: Supervision/Verbal cueing Stand Pivot Transfers: Supervision/Verbal cueing;Contact Guard/Touching assist Transfer (Assistive device): None Locomotion  Gait Ambulation: Yes Gait Distance (Feet): 400 Feet Assistive device: None Gait Gait: Yes Gait Pattern: Within Functional Limits Gait velocity: mildly decreased for age 62 / Additional Locomotion Stairs: Yes Stairs Assistance: Supervision/Verbal cueing;Contact Guard/Touching assist Stair Management Technique: Two rails Number of Stairs: 20 Height of Stairs: 6 Ramp: Supervision/Verbal cueing Curb: Supervision/Verbal cueing Wheelchair Mobility Wheelchair Mobility: No    Discharge Criteria: Patient will be discharged from PT if patient refuses treatment 3 consecutive times without medical reason, if treatment goals not met, if there is a change in medical status, if patient makes no progress towards goals or if patient is discharged from hospital.  The above assessment, treatment plan, treatment alternatives and goals were discussed and mutually agreed upon: by patient and by family  Loel Dubonnet PT, DPT, CSRS 08/19/2022, 7:04 PM

## 2022-08-20 NOTE — Progress Notes (Signed)
Speech Language Pathology Daily Session Note  Patient Details  Name: REI MEDLEN MRN: [id number] Date of Birth: 1965-06-05  Today's Date: 08/20/2022 SLP Individual Time: 1116-1202 SLP Individual Time Calculation (min): 46 min  Short Term Goals: Week 1: SLP Short Term Goal 1 (Week 1): STG=LTGs due to ELOS  Skilled Therapeutic Interventions: Pt seen for skilled ST with focus on expressive and receptive language goals, pt in room and agreeable to ambulate to Speech room for tx session. Wife present throughout. Initiated session with automatics, pt able to count 1-12 x3, unable to say "13" and starts back at "1". Pt able to state first name with some distortion, able to say last name with max multimodal cues. Pt benefits from mirror during remainder of session to increase awareness of proper placement of articulators. Pt with most difficulty with labials and bilabials. With mod-max multimodal cues and mass practice, pt able to inconsistently produce simple speech sounds (no blends, affricates, etc) in initial, medial and final position. Pt able to repeat CVC words with max cues ~60% accuracy. Pt able to match object to picture in Fo6 100% accurate, able to match object to word in Fo6 100% accurate. Pt continues with significant difficulty answering yes/no questions accuracy despite max cues and yes/no communication board, inconsistent accuracy and perseveration. Discussed with patient and wife ongoing independent practice in room to promote expressive language. At end of session, pt repeating "young, strong, man!"X2. Pt walked back to room and left with all needs met, cont ST POC.   Pain Pain Assessment Pain Scale: 0-10 Pain Score: 0-No pain  Therapy/Group: Individual Therapy  Tacey Ruiz 08/20/2022, 12:07 PM

## 2022-08-20 NOTE — Progress Notes (Signed)
Occupational Therapy Session Note  Patient Details  Name: [name] MRN: [id number] Date of Birth: [birth year]  Today's Date: 08/20/2022 AM Session:  OT Individual Time: 1000-1045 OT Individual Time Calculation (min): 45 min   PM session:  OT Individual Time: 1350-1445 OT Individual Time Calculation (min): 55 min  Short Term Goals: Week 1:  OT Short Term Goal 1 (Week 1): STG=LTG d/t Pt ELOS  Skilled Therapeutic Interventions/Progress Updates:     AM Session: Pt received sitting up in bed with wife, [name], present in room. Pt in good spirits reporting 0/10 pain and receptive to skilled OT session with focus functional cognition. Pt continuing to present with expressing and receptive aphasia. Pt transitioned to EOB and ambulated to ADL apartment mod I without AD. While walking through hallway, engaged Pt in letter identification activity with Pt unable to initially state numbers, however able to repeat numbers after hearing pronunciation from therapist with multiple attempts provided. In ADL apartment, engaged Pt in word identification/object identification activity using common household items (fork, cup, spoon, plate, bowl). Initially instructed Pt to read and repeat word after therapist with multiple attempts required to say correct word. Pt often saying the word "often" vs. requested word on paper. Worked on written word recognition and matching words to same object with Pt able to correctly match all words to items after reviewing objects with therapists with min A to correct mistake of mixing up "fork" and "spoon". Pt emotional during activity, requiring multiple breaks to rest and reset before attempting activity again. Transitioned to therapy gym for remainder of session for color identification activity. Provided written words of "blue, red, green, yellow" and instructed to match colored legos to same colored words with Pt able to complete with 100% accuracy. Practiced repeating colors  after therapist with Pt perseverating on color "yellow" requiring max VB cues to transition to repeating new word. Pt ambulated back to room mod I while therapist engaged Pt in counting activity. Pt initially repeated each number after therapist counting from 1-20 then Pt was able to independently count from 1-13. Pt tearful following activity and hugging therapist with OT providing therapeutic support and motivation. Pt left resting in bed at end of session with call bell in reach, wife present in room, and all needs met.   PM Session:  Pt received sitting up in bed with family present in room. Pt in good spirits and receptive to skilled OT session reporting 0/10 pain. Pt's wife inquiring about grounds pass at beginning of session. Provided education on purpose of grounds pass and hospital policies with both Pt and Pt's wife receptive to education. Messaged PA who granted approval for grounds pass for remainder of Pt's hospital stay. Pt receptive to completing session outside to provide change in environment and support Pt moral and motivation for therapy session. Engaged Pt and Pt's wife in light hearted conversation discussing pt's interest with Pt able to verbalize "basket ball" and answer yes/no questions  with ~50% accuracy this session. Worked on matching written words to item outdoors such as "flower, rock, tree, leaf, bench, sun, etc." with Pt able to complete with 100% accuracy. Then engaged pt in pronouncing each word with massed practice and OT modeling letter articulation. Massed practice of color pronunciation to support carryover from previous sessions and also add additional colors such as "purple and orange" with Pt requiring max modeling cues and therapist providing words broken down into syllables. Pt presenting with challenges pronouncing "green" and the second half of "  yellow" (low) despite maximal cueing and modeling. Pt able to say "wow", "like", and "dang" this sessio when demonstrating  frustration. Educated Pt and Pt's wife on importance of continuing to practice expressive language outside of therapy sessions and listening/singing along to Pt desired music as simple strategy for language return. Also discussed strategy of using mirror to provide visual feedback to Pt when attempting to articulate words. Pt ambulated back to room mod I and was left resting in bed with call bell in reach, family present in room, and all needs met.   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

## 2022-08-20 NOTE — Progress Notes (Signed)
PROGRESS NOTE   Subjective/Complaints:  Pt reports "yes" and nods head- wife reports LBM last night.  Pt "doesn't have DM" so was asking if needs to get CBGs checked- has prediabetes, so will con't BID-    ROS: Limited due to aphasia/communication  Objective:   No results found. Recent Labs    08/18/22 1912 08/19/22 0623  WBC 9.1 10.2  HGB 11.5* 11.7*  HCT 35.2* 36.2*  PLT 218 206   Recent Labs    08/18/22 1912 08/19/22 0623  NA  --  140  K  --  4.1  CL  --  110  CO2  --  21*  GLUCOSE  --  117*  BUN  --  50*  CREATININE 3.27* 3.33*  CALCIUM  --  8.8*    Intake/Output Summary (Last 24 hours) at 08/20/2022 1555 Last data filed at 08/20/2022 1300 Gross per 24 hour  Intake 708 ml  Output --  Net 708 ml        Physical Exam: Vital Signs Blood pressure (!) 160/93, pulse 91, temperature 98.3 F (36.8 C), temperature source Oral, resp. rate 18, height 6' (1.829 m), weight 92.4 kg, SpO2 100 %.   General: awake, alert, constantly nodding head; wife at bedside; came back to room with PT; NAD HENT: ; oropharynx moist CV: regular rate; no JVD Pulmonary: CTA B/L; no W/R/R- good air movement GI: soft, NT, ND, (+)BS Psychiatric: calm; nods head to respond or says yes, no other responses Neurological: nonverbal except yes/nodding head- severe aphasia Musculoskeletal:     Right lower leg: No edema.     Left lower leg: No edema.  Neurological:     Comments: aphasic  Psychiatric:        Mood and Affect: Mood normal.        Behavior: Behavior normal.     Neuro:  Eyes without evidence of nystagmus  Tone is normal without evidence of spasticity Cerebellar exam shows no evidence of ataxia on finger nose finger or heel to shin testing No evidence of trunkal ataxia   Motor strength is 5/5 in bilateral deltoid, biceps, triceps, finger flexors and extensors, wrist flexors and extensors, hip flexors, knee flexors  and extensors, ankle dorsiflexors, plantar flexors, invertors and evertors, toe flexors and extensors Unable to close eyes to verbal command   Sensory exam is normal to pinprick, proprioception and light touch in the upper and lower limbs    Cranial nerves II- cannot assess III- no evidence of ptosis, upward, downward and medial gaze intact IV- no vertical diplopia or head tilt V- cannot assess VI- no pupil abduction weakness VII- no facial droop, good lid closure VII-cannot assess   X- limited verbal output assessment limited no hoarseness XI- no trap or SCM weakness XII- no glossal weakness  Assessment/Plan: 1. Functional deficits which require 3+ hours per day of interdisciplinary therapy in a comprehensive inpatient rehab setting. Physiatrist is providing close team supervision and 24 hour management of active medical problems listed below. Physiatrist and rehab team continue to assess barriers to discharge/monitor patient progress toward functional and medical goals  Care Tool:  Bathing    Body parts bathed by patient:  Right arm, Left arm, Chest, Abdomen, Front perineal area, Buttocks, Right upper leg, Left upper leg, Face, Left lower leg, Right lower leg         Bathing assist Assist Level: Supervision/Verbal cueing     Upper Body Dressing/Undressing Upper body dressing   What is the patient wearing?: Pull over shirt    Upper body assist Assist Level: Supervision/Verbal cueing    Lower Body Dressing/Undressing Lower body dressing      What is the patient wearing?: Pants     Lower body assist Assist for lower body dressing: Supervision/Verbal cueing     Toileting Toileting    Toileting assist Assist for toileting: Supervision/Verbal cueing     Transfers Chair/bed transfer  Transfers assist     Chair/bed transfer assist level: Supervision/Verbal cueing     Locomotion Ambulation   Ambulation assist      Assist level: Contact Guard/Touching  assist Assistive device: No Device Max distance: 400 ft   Walk 10 feet activity   Assist     Assist level: Supervision/Verbal cueing Assistive device: No Device   Walk 50 feet activity   Assist    Assist level: Contact Guard/Touching assist Assistive device: No Device    Walk 150 feet activity   Assist    Assist level: Contact Guard/Touching assist Assistive device: No Device    Walk 10 feet on uneven surface  activity   Assist     Assist level: Contact Guard/Touching assist Assistive device: Other (comment) (no device)   Wheelchair     Assist Is the patient using a wheelchair?: No   Wheelchair activity did not occur: N/A         Wheelchair 50 feet with 2 turns activity    Assist    Wheelchair 50 feet with 2 turns activity did not occur: N/A       Wheelchair 150 feet activity     Assist  Wheelchair 150 feet activity did not occur: N/A       Blood pressure (!) 160/93, pulse 91, temperature 98.3 F (36.8 C), temperature source Oral, resp. rate 18, height 6' (1.829 m), weight 92.4 kg, SpO2 100 %.  Medical Problem List and Plan: 1. 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 S/p tenecteplase, thrombectomy; has global aphasia             -patient may  shower             -ELOS/Goals: 5-7 d   First day of evaluations- con't CIR, PT, OT and SLP 2.  Antithrombotics: -DVT/anticoagulation:  Pharmaceutical: Heparin             -antiplatelet therapy: Aspirin and Plavix for three weeks followed by Plavix alone (DAPT started 4/22)   3. Pain Management: Tylenol as needed   4. Mood/Behavior/Sleep: LCSW to evaluate and provide emotional support             -antipsychotic agents: n/a   5. Neuropsych/cognition: This patient is not  capable of making decisions on his  own behalf.   6. Skin/Wound Care: Routine skin care checks   7.  Fluids/Electrolytes/Nutrition: Routine Is and Os and follow-up chemistries   8: Hypertension: monitor TID and prn (Home meds: Amlodipine 10 mg, carvedilol 25 mg, lisinopril 10 mg)             -continue carvedilol 12.5 mg BID             -  lisinopril now on allergy list due to #15   4/27- usually controlled, however somewhat elevated this Afternoon at 160s systolic- but was 120s-130s- con't to monitor trend 9: Hyperlipidemia: continue statin   10: Global aphasia: SLP eval   11: OSA: continue CPAP at night   12: DM-2: A1c 6.3%; CBGs better off steroids (no home meds)             -carb modified diet             -? DC SSI   4/27- switched to CBGs BID-AC- will let primary team on Monday assess.  13: CKD stage IV: baseline serum creatinine ~3.5                  Latest Ref Rng & Units 08/19/2022    6:23 AM 08/18/2022    7:12 PM 08/17/2022    7:40 AM  BMP  Glucose 70 - 99 mg/dL 440    102   BUN 6 - 20 mg/dL 50    59   Creatinine 7.25 - 1.24 mg/dL 3.66  4.40  3.47   Sodium 135 - 145 mmol/L 140    137   Potassium 3.5 - 5.1 mmol/L 4.1    4.2   Chloride 98 - 111 mmol/L 110    107   CO2 22 - 32 mmol/L 21    20   Calcium 8.9 - 10.3 mg/dL 8.8    8.3     4/25- Cr 3.33- will recheck Monday- BUN 50- both are better than when admitted to acute hospital 14: PFO: Spencer grade 5 s/p TEE- per Dr Excell Seltzer will f/u in office ~57mo, if no Afib, likely will need PFO closure    15: Angioedema s/p tenecteplase admin>>resolved   16: Anemia; mild: follow-up CBC    17.  Receptive aphasia plus severe speech apraxia   18. Will d/c IV since not using  I spent a total of 35   minutes on total care today- >50% coordination of care- due to  Discussing pt's aphasia with wife and ways to work on it.   LOS: 2 days A FACE TO FACE EVALUATION WAS PERFORMED  Wolfgang Finigan 08/20/2022, 3:55 PM

## 2022-08-21 DIAGNOSIS — I63512 Cerebral infarction due to unspecified occlusion or stenosis of left middle cerebral artery: Secondary | ICD-10-CM | POA: Diagnosis not present

## 2022-08-21 LAB — GLUCOSE, CAPILLARY
Glucose-Capillary: 112 mg/dL — ABNORMAL HIGH (ref 70–99)
Glucose-Capillary: 129 mg/dL — ABNORMAL HIGH (ref 70–99)

## 2022-08-21 NOTE — Progress Notes (Addendum)
PROGRESS NOTE   Subjective/Complaints:  Pt keeps saying yes Wife noted pt ate all tray except 1/2 of the banana.  Had loop placed Thursday- asking about shower?  LBM yesterday  ROS: Limited due to communication/aphasia Objective:   No results found. Recent Labs    08/18/22 1912 08/19/22 0623  WBC 9.1 10.2  HGB 11.5* 11.7*  HCT 35.2* 36.2*  PLT 218 206   Recent Labs    08/18/22 1912 08/19/22 0623  NA  --  140  K  --  4.1  CL  --  110  CO2  --  21*  GLUCOSE  --  117*  BUN  --  50*  CREATININE 3.27* 3.33*  CALCIUM  --  8.8*    Intake/Output Summary (Last 24 hours) at 08/21/2022 1101 Last data filed at 08/21/2022 1610 Gross per 24 hour  Intake 952 ml  Output --  Net 952 ml        Physical Exam: Vital Signs Blood pressure (!) 119/95, pulse 69, temperature 97.6 F (36.4 C), temperature source Oral, resp. rate 16, height 6' (1.829 m), weight 92.4 kg, SpO2 100 %.    General: awake, alert, watching TV; pt lying in bed; wife at bedside; NAD HENT: conjugate gaze; oropharynx moist CV: regular rate; no JVD Pulmonary: CTA B/L; no W/R/R- good air movement GI: soft, NT, ND, (+)BS Psychiatric: calm Neurological: kept saying yes- perseveration- couldn't shake head no or say no Musculoskeletal:     Right lower leg: No edema.     Left lower leg: No edema.  Neurological:     Comments: aphasic  Psychiatric:        Mood and Affect: Mood normal.        Behavior: Behavior normal.     Neuro:  Eyes without evidence of nystagmus  Tone is normal without evidence of spasticity Cerebellar exam shows no evidence of ataxia on finger nose finger or heel to shin testing No evidence of trunkal ataxia   Motor strength is 5/5 in bilateral deltoid, biceps, triceps, finger flexors and extensors, wrist flexors and extensors, hip flexors, knee flexors and extensors, ankle dorsiflexors, plantar flexors, invertors and evertors,  toe flexors and extensors Unable to close eyes to verbal command   Sensory exam is normal to pinprick, proprioception and light touch in the upper and lower limbs    Cranial nerves II- cannot assess III- no evidence of ptosis, upward, downward and medial gaze intact IV- no vertical diplopia or head tilt V- cannot assess VI- no pupil abduction weakness VII- no facial droop, good lid closure VII-cannot assess   X- limited verbal output assessment limited no hoarseness XI- no trap or SCM weakness XII- no glossal weakness  Assessment/Plan: 1. Functional deficits which require 3+ hours per day of interdisciplinary therapy in a comprehensive inpatient rehab setting. Physiatrist is providing close team supervision and 24 hour management of active medical problems listed below. Physiatrist and rehab team continue to assess barriers to discharge/monitor patient progress toward functional and medical goals  Care Tool:  Bathing    Body parts bathed by patient: Right arm, Left arm, Chest, Abdomen, Front perineal area, Buttocks, Right upper leg, Left  upper leg, Face, Left lower leg, Right lower leg         Bathing assist Assist Level: Supervision/Verbal cueing     Upper Body Dressing/Undressing Upper body dressing   What is the patient wearing?: Pull over shirt    Upper body assist Assist Level: Supervision/Verbal cueing    Lower Body Dressing/Undressing Lower body dressing      What is the patient wearing?: Pants     Lower body assist Assist for lower body dressing: Supervision/Verbal cueing     Toileting Toileting    Toileting assist Assist for toileting: Supervision/Verbal cueing     Transfers Chair/bed transfer  Transfers assist     Chair/bed transfer assist level: Supervision/Verbal cueing     Locomotion Ambulation   Ambulation assist      Assist level: Contact Guard/Touching assist Assistive device: No Device Max distance: 400 ft   Walk 10 feet  activity   Assist     Assist level: Supervision/Verbal cueing Assistive device: No Device   Walk 50 feet activity   Assist    Assist level: Contact Guard/Touching assist Assistive device: No Device    Walk 150 feet activity   Assist    Assist level: Contact Guard/Touching assist Assistive device: No Device    Walk 10 feet on uneven surface  activity   Assist     Assist level: Contact Guard/Touching assist Assistive device: Other (comment) (no device)   Wheelchair     Assist Is the patient using a wheelchair?: No   Wheelchair activity did not occur: N/A         Wheelchair 50 feet with 2 turns activity    Assist    Wheelchair 50 feet with 2 turns activity did not occur: N/A       Wheelchair 150 feet activity     Assist  Wheelchair 150 feet activity did not occur: N/A       Blood pressure (!) 119/95, pulse 69, temperature 97.6 F (36.4 C), temperature source Oral, resp. rate 16, height 6' (1.829 m), weight 92.4 kg, SpO2 100 %.  Medical Problem List and Plan: 1. 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 S/p tenecteplase, thrombectomy; has global aphasia             -patient may  shower on Monday 4/29             -ELOS/Goals: 5-7 d   Con't CIR PT, OT and SLP- will get communication board? 2.  Antithrombotics: -DVT/anticoagulation:  Pharmaceutical: Heparin             -antiplatelet therapy: Aspirin and Plavix for three weeks followed by Plavix alone (DAPT started 4/22)   3. Pain Management: Tylenol as needed   4. Mood/Behavior/Sleep: LCSW to evaluate and provide emotional support             -antipsychotic agents: n/a   5. Neuropsych/cognition: This patient is not  capable of making decisions on his  own behalf.   6. Skin/Wound Care: Routine skin care checks   7. Fluids/Electrolytes/Nutrition: Routine Is and Os and follow-up  chemistries   8: Hypertension: monitor TID and prn (Home meds: Amlodipine 10 mg, carvedilol 25 mg, lisinopril 10 mg)             -continue carvedilol 12.5 mg BID             -lisinopril now on allergy list due to #15  4/27- usually controlled, however somewhat elevated this Afternoon at 160s systolic- but was 120s-130s- con't to monitor trend  4/28- Doing better- but DBP somewhat elevated- will con't to monitor trend 9: Hyperlipidemia: continue statin   10: Global aphasia: SLP eval   11: OSA: continue CPAP at night   12: DM-2: A1c 6.3%; CBGs better off steroids (no home meds)             -carb modified diet             -? DC SSI   4/27- switched to CBGs BID-AC- will let primary team on Monday assess.  13: CKD stage IV: baseline serum creatinine ~3.5                  Latest Ref Rng & Units 08/19/2022    6:23 AM 08/18/2022    7:12 PM 08/17/2022    7:40 AM  BMP  Glucose 70 - 99 mg/dL 098    119   BUN 6 - 20 mg/dL 50    59   Creatinine 1.47 - 1.24 mg/dL 8.29  5.62  1.30   Sodium 135 - 145 mmol/L 140    137   Potassium 3.5 - 5.1 mmol/L 4.1    4.2   Chloride 98 - 111 mmol/L 110    107   CO2 22 - 32 mmol/L 21    20   Calcium 8.9 - 10.3 mg/dL 8.8    8.3     8/65- Cr 3.33- will recheck Monday- BUN 50- both are better than when admitted to acute hospital 14: PFO: Spencer grade 5 s/p TEE- per Dr Excell Seltzer will f/u in office ~8mo, if no Afib, likely will need PFO closure    15: Angioedema s/p tenecteplase admin>>resolved   16: Anemia; mild: follow-up CBC    17.  Receptive aphasia plus severe speech apraxia  4/28- asked nursing to get communication board to see if he could use   18. Will d/c IV since not using  I spent a total of 36    minutes on total care today- >50% coordination of care- due to  D/w pt's wife as well as nursing about communication board and loop recorder- can have shower in AM  LOS: 3 days A FACE TO FACE EVALUATION WAS PERFORMED  Doshie Maggi 08/21/2022, 11:01 AM

## 2022-08-21 NOTE — Progress Notes (Signed)
Speech Language Pathology Daily Session Note  Patient Details  Name: [name] MRN: [id number] Date of Birth: Nov 15, 1965  Today's Date: 08/21/2022 SLP Individual Time: 1030-1115 1400- 1445 SLP Individual Time Calculation (min): 45 min 45 min  Short Term Goals: Week 1: SLP Short Term Goal 1 (Week 1): STG=LTGs due to ELOS  Skilled Therapeutic Interventions:   Session 1: Pt was seen in am to address aphasia. Pt was alert and encountered in his room with his wife present. Pt agreeable for ST session and transitioned to ST office with wife accompanying and participating intermittently throughout. SLP addressing pt response to biographical yes/ no questions with ~15% acc with max verbal and visual cues including writing key word. Session transitioned to completion of automatic speech, pt counted to 100 given min A. He also recited days of week with min A. Pt presents with mostly spared repetition occasionally impacted by motor planning. Pt verbalized his name throughout session with min A for hitting articulatory targets. SLP also addressed verbalizing his wife's name and DOB consistently requiring min A with cues unable to be faded. Pt given intermittent breaks with SLP playing familiar songs. Pt able to sing the lyrics spontaneously. In additional minutes of session, SLP addressed identifying objects. Pt identified objects in a FO2 with 71% acc. He identified objects given FO3 with 82% acc improving to 100% with ability to self correct given repetition and additional time. Pt transitioned back to his room with his wife present and call button within reach.   Session 2: Pt seen in PM to address aphasia. Again, pt and his wife transitioned to ST office for completion of session. SLP educated pt and his wife on apraxia in relation to aphasia. SLP trialed low tech AAC core board with pt identifying symbols with min to mod A on core board with FO12 with 70% acc with mod A. Pt may benefit from continued  training with core board for successful communication. Per wife she has been able to anticipate some of pt's needs as well as pt providing some gestures for needs. In additional minutes of session, mirror provided to facilitate visual feedback and support hitting articulatory targets. Pt verbalized his name, wife's name, and DOB with min to mod A. Utilizing the Language Activity Resource Kit [name]) and only 4 objects, SLP focused on naming items. Pt completed task with ~45% acc with mod to max cues including carrier phrase, phonemic and/ or syllabic cues. Education presented to pt and his wife regarding some community resources as well as apps that may Technical brewer. Pt was transitioned back to his room where his wife was present. SLP to continue POC.   Pain  No pain  Therapy/Group: Individual Therapy  Renaee Munda 08/21/2022, 3:55 PM

## 2022-08-22 DIAGNOSIS — I63512 Cerebral infarction due to unspecified occlusion or stenosis of left middle cerebral artery: Secondary | ICD-10-CM | POA: Diagnosis not present

## 2022-08-22 LAB — GLUCOSE, CAPILLARY
Glucose-Capillary: 128 mg/dL — ABNORMAL HIGH (ref 70–99)
Glucose-Capillary: 102 mg/dL — ABNORMAL HIGH (ref 70–99)
Glucose-Capillary: 123 mg/dL — ABNORMAL HIGH (ref 70–99)
Glucose-Capillary: 117 mg/dL — ABNORMAL HIGH (ref 70–99)

## 2022-08-22 LAB — BASIC METABOLIC PANEL
Anion gap: 10 (ref 5–15)
BUN: 49 mg/dL — ABNORMAL HIGH (ref 6–20)
CO2: 20 mmol/L — ABNORMAL LOW (ref 22–32)
Calcium: 8.8 mg/dL — ABNORMAL LOW (ref 8.9–10.3)
Chloride: 107 mmol/L (ref 98–111)
Creatinine, Ser: 3.22 mg/dL — ABNORMAL HIGH (ref 0.61–1.24)
GFR, Estimated: 21 mL/min — ABNORMAL LOW (ref >60)
Glucose, Bld: 122 mg/dL — ABNORMAL HIGH (ref 70–99)
Potassium: 4.1 mmol/L (ref 3.5–5.1)
Sodium: 137 mmol/L (ref 135–145)

## 2022-08-22 LAB — CBC
HCT: 36.4 % — ABNORMAL LOW (ref 39.0–52.0)
Hemoglobin: 12.2 g/dL — ABNORMAL LOW (ref 13.0–17.0)
MCH: 28 pg (ref 26.0–34.0)
MCHC: 33.5 g/dL (ref 30.0–36.0)
MCV: 83.5 fL (ref 80.0–100.0)
Platelets: 234 10*3/uL (ref 150–400)
RBC: 4.36 MIL/uL (ref 4.22–5.81)
RDW: 14.1 % (ref 11.5–15.5)
WBC: 13.7 10*3/uL — ABNORMAL HIGH (ref 4.0–10.5)
nRBC: 0 % (ref 0.0–0.2)

## 2022-08-22 NOTE — Telephone Encounter (Signed)
Pt was D/C from hospital today, maybe try ILR Post implant fu 08/23/22 to allow patient to get home and settled.

## 2022-08-22 NOTE — Discharge Instructions (Signed)
Inpatient Rehab Discharge Instructions  [facility] Discharge date and time:  08/24/2022  Activities/Precautions/ Functional Status: Activity: no lifting, driving, or strenuous exercise until cleared by MD Diet: diabetic diet Wound Care: keep wound clean and dry  Functional status:  ___ No restrictions     ___ Walk up steps independently _X__ 24/7 supervision/assistance   ___ Walk up steps with assistance ___ Intermittent supervision/assistance  ___ Bathe/dress independently ___ Walk with walker     ___ Bathe/dress with assistance ___ Walk Independently    ___ Shower independently ___ Walk with assistance    __x_ Shower with assistance __x_ No alcohol  ___ Return to work/school ________  Special Instructions:  No driving, alcohol consumption or tobacco use. 2.  Family needs to manage/administer medications 3. Aspirin will stop after 11 days. Will need to continue taking Plavix indefinitely to prevent another stroke.    COMMUNITY REFERRALS UPON DISCHARGE:     Outpatient: PT     OT    ST                Agency: [facility]  Phone:[phone]              Appointment Date/Time: Please allow 3-5 days for scheduling to reach out.     STROKE/TIA DISCHARGE INSTRUCTIONS SMOKING Cigarette smoking nearly doubles your risk of having a stroke & is the single most alterable risk factor  If you smoke or have smoked in the last 12 months, you are advised to quit smoking for your health. Most of the excess cardiovascular risk related to smoking disappears within a year of stopping. Ask you doctor about anti-smoking medications  Quit Line: 1-800-QUIT NOW Free Smoking Cessation Classes [phone]  CHOLESTEROL Know your levels; limit fat & cholesterol in your diet  Lipid Panel     Component Value Date/Time   CHOL 172 08/15/2022 0424   TRIG 125 08/16/2022 0647   HDL 36 (L) 08/15/2022 0424   CHOLHDL 4.8 08/15/2022 0424   VLDL 40 08/15/2022 0424   LDLCALC 96 08/15/2022  0424     Many patients benefit from treatment even if their cholesterol is at goal. Goal: Total Cholesterol (CHOL) less than 160 Goal:  Triglycerides (TRIG) less than 150 Goal:  HDL greater than 40 Goal:  LDL (LDLCALC) less than 100   BLOOD PRESSURE American Stroke Association blood pressure target is less that 120/80 mm/Hg  Your discharge blood pressure is:  BP: 111/89 Monitor your blood pressure Limit your salt and alcohol intake Many individuals will require more than one medication for high blood pressure  DIABETES (A1c is a blood sugar average for last 3 months) Goal HGBA1c is under 7% (HBGA1c is blood sugar average for last 3 months)  Diabetes: Diagnosis of diabetes:  Your A1c:6.3 %    Lab Results  Component Value Date   HGBA1C 6.3 (H) 08/15/2022    Your HGBA1c can be lowered with medications, healthy diet, and exercise. Check your blood sugar as directed by your physician Call your physician if you experience unexplained or low blood sugars.  PHYSICAL ACTIVITY/REHABILITATION Goal is 30 minutes at least 4 days per week  Activity: Increase activity slowly, Therapies: Physical Therapy: Outpatient, Occupational Therapy: Outpatient, and Speech Therapy: Outpatient Return to work: when cleared by MD Activity decreases your risk of heart attack and stroke and makes your heart stronger.  It helps control your weight and blood pressure; helps you relax and can improve your mood. Participate in a regular exercise  program. Talk with your doctor about the best form of exercise for you (dancing, walking, swimming, cycling).  DIET/WEIGHT Goal is to maintain a healthy weight  Your discharge diet is:  Diet Order             Diet Heart Room service appropriate? Yes; Fluid consistency: Thin  Diet effective now                  thin liquids Your height is:  Height: 6' (182.9 cm) Your current weight is: Weight: 92.4 kg Your Body Mass Index (BMI) is:  BMI (Calculated): 27.62 Following  the type of diet specifically designed for you will help prevent another stroke. Your goal weight range is:   Your goal Body Mass Index (BMI) is 19-24. Healthy food habits can help reduce 3 risk factors for stroke:  High cholesterol, hypertension, and excess weight.  RESOURCES Stroke/Support Group:  Call [phone]   STROKE EDUCATION PROVIDED/REVIEWED AND GIVEN TO PATIENT Stroke warning signs and symptoms How to activate emergency medical system (call 911). Medications prescribed at discharge. Need for follow-up after discharge. Personal risk factors for stroke. Pneumonia vaccine given: No Flu vaccine given: No My questions have been answered, the writing is legible, and I understand these instructions.  I will adhere to these goals & educational materials that have been provided to me after my discharge from the hospital.     My questions have been answered and I understand these instructions. I will adhere to these goals and the provided educational materials after my discharge from the hospital.  Patient/Caregiver Signature _______________________________ Date __________  Clinician Signature _______________________________________ Date __________  Please bring this form and your medication list with you to all your follow-up doctor's appointments.

## 2022-08-22 NOTE — Discharge Summary (Signed)
Physician Discharge Summary  Patient ID: [name] MRN: [id number] DOB/AGE: 06-22-1965 57 y.o.  Admit date: 08/18/2022 Discharge date: 08/24/2022  Discharge Diagnoses:  Principal Problem:   Acute ischemic left MCA stroke [facility]) Active problems: Functional deficits secondary to left MCA stroke PFO Hypertension Hyperlipidemia OSA Pre-DM-2 CKD stage IV Angioedema s/p tenecteplase Anemia  Discharged Condition: good  Significant Diagnostic Studies: none  Labs:  Basic Metabolic Panel: Recent Labs  Lab 08/18/22 1912 08/19/22 0623 08/22/22 0546  NA  --  140 137  K  --  4.1 4.1  CL  --  110 107  CO2  --  21* 20*  GLUCOSE  --  117* 122*  BUN  --  50* 49*  CREATININE 3.27* 3.33* 3.22*  CALCIUM  --  8.8* 8.8*    CBC: Recent Labs  Lab 08/18/22 1912 08/19/22 0623 08/22/22 0546  WBC 9.1 10.2 13.7*  NEUTROABS  --  6.9  --   HGB 11.5* 11.7* 12.2*  HCT 35.2* 36.2* 36.4*  MCV 84.8 85.2 83.5  PLT 218 206 234    CBG: Recent Labs  Lab 08/22/22 1628 08/22/22 2023 08/23/22 0618 08/23/22 1144 08/23/22 1624  GLUCAP 123* 117* 121* 112* 92    Brief HPI:   [name] is a 57 y.o. male who presented to the [facility] ED on 08/14/2022 noted by wife to be very weak and confused and halfway enter out of the bathtub at home. Symptoms were sudden in onset. Last known well 9 PM. Patient presented with global aphasia. Code stroke activated. CT head negative for hemorrhage, aspects of 9, mild hyperdensity left MCA could indicate thrombus. Tenecteplase administered. CTA showed left M1 occlusion and neurointerventional radiology contacted. Patient transferred to [facility] for thrombectomy. On route he was noted to have periorbital welts and uvular swelling. Was concern of possible angioedema secondary to tenecteplase. He was intubated for airway protection and underwent cerebral angiogram by Dr. Tommie Sams. Recanalization of the left MCA vascular tree noted. Critical care  medicine consulted for management and placed on low-dose Neo-Synephrine infusion. Extubated on 4/23. 2D echo with EF of approximately 60 to 65% with trivial MVR. LDL 96, hemoglobin A1c 6.3%. He was started on heparin subcutaneously for VTE prophylaxis. The patient was maintained on aspirin 81 mg daily prior to admission and was started on Plavix and aspirin restarted. Recommend aspirin and Plavix for 3 weeks then Plavix alone. Cleared for oral diet and therapy evaluations carried out. He underwent TCD with bubble study on 4/24. Bilateral lower extremity venous duplex negative for DVT. His transcranial Doppler was significant for Spencer grade 5 patent foramen ovale. EP consult obtained on 4/25 and TEE performed. Loop recrder placedMod I bed mobility, supervision transfers, and min guard assist amb 300' without AD. Pt continues to demo R drift during gait with decreased awareness of obstacles on R. Pt following simple commands 60% of trials.     Hospital Course: [name] was admitted to rehab 08/18/2022 for inpatient therapies to consist of PT, ST and OT at least three hours five days a week. Past admission physiatrist, therapy team and rehab RN have worked together to provide customized collaborative inpatient rehab. Labs remained stable on 4/29 with serum creatinine now 3.22, BUN 49. Hemoglobin improved. CBG continued BID due to A1c of prediabetes. Receptive aphasia plus severe speech apraxia. Communication board attempted but due to impairment not able to be utilized. Mod I in room on 4/29.   Blood pressures were monitored  on TID basis and carvedilol 12.5 mg BID continued. Lisinopril placed on allergy list secondary to angioedema with tenecteplase.  Diabetes has been monitored with ac/hs CBG checks and SSI was use prn for tighter BS control. Switched to CBGs BID on 4/27. No other home anti-hypertensives restarted.   Rehab course: During patient's stay in rehab weekly team conferences were held to  monitor patient's progress, set goals and discuss barriers to discharge. At admission, patient required CGA  with basic self-care skills, supervision assist with mobility. OP SLP referral sent to [place].  He has had improvement in activity tolerance, balance, postural control as well as ability to compensate for deficits. He has had improvement in functional use RUE/LUE  and RLE/LLE as well as improvement in awareness. Mod I in room. Pt named 4 items given mod to max A including phonemic and semantic cues switching between all 4 multiple times.   Discharge disposition: 01-Home or Self Care      Diet: carb modified, heart healthy  Special Instructions: No driving, alcohol consumption or tobacco use.  Aspirin and Plavix for three weeks followed by Plavix alone (DAPT started 4/22) Will have aspirin therapy completed with last dose on 09/04/2022.  30-35 minutes were spent on discharge planning and discharge summary.  Discharge Instructions     Ambulatory referral to Neurology   Complete by: As directed    An appointment is requested in approximately: 4 weeks   Ambulatory referral to Physical Medicine Rehab   Complete by: As directed    Hospital follow-up     Aspirin and Plavix for three weeks followed by Plavix alone (DAPT started 4/22) Will have aspirin therapy completed with last dose on 09/04/2022.   Allergies as of 08/24/2022       Reactions   Amitriptyline Anaphylaxis   Wife reported he had a cardiac arrest in the setting of starting amitriptyline in 2019   Tenecteplase Anaphylaxis   Hives and oral edema necessitating intubation following administration of Tenecteplase   Elavil [amitriptyline Hcl]    Lisinopril Other (See Comments)   Angioedema suspected from TNK d/t timing of reaction; however, adding lisinopril so providers could be informed/cautious with future care (was on lisinopril pta).        Medication List     STOP taking these medications    acetaminophen  325 MG tablet Commonly known as: TYLENOL   amitriptyline 25 MG tablet Commonly known as: ELAVIL   amLODipine 10 MG tablet Commonly known as: NORVASC   chlorthalidone 25 MG tablet Commonly known as: HYGROTON   Cholecalciferol 25 MCG (1000 UT) capsule   febuxostat 40 MG tablet Commonly known as: ULORIC   heparin 5000 UNIT/ML injection   hydrALAZINE 10 MG tablet Commonly known as: APRESOLINE   insulin aspart 100 UNIT/ML injection Commonly known as: novoLOG   lisinopril 10 MG tablet Commonly known as: ZESTRIL   meloxicam 15 MG tablet Commonly known as: MOBIC   senna-docusate 8.6-50 MG tablet Commonly known as: Senokot-S   traMADol 50 MG tablet Commonly known as: Ultram       TAKE these medications    aspirin 81 MG tablet Take 1 tablet (81 mg total) by mouth daily for 11 days. What changed: Another medication with the same name was removed. Continue taking this medication, and follow the directions you see here.   atorvastatin 20 MG tablet Commonly known as: LIPITOR Take 1 tablet (20 mg total) by mouth daily.   carvedilol 12.5 MG tablet Commonly known as: COREG  Take 1 tablet (12.5 mg total) by mouth 2 (two) times daily. What changed:  medication strength Another medication with the same name was removed. Continue taking this medication, and follow the directions you see here.   clopidogrel 75 MG tablet Commonly known as: PLAVIX Take 1 tablet (75 mg total) by mouth daily.        Follow-up Information     Gauger, Hermenia Fiscal, NP Follow up.   Specialty: Internal Medicine Why: Call the office in 1-2 days to make arrangements for hospital follow-up appointment. Contact information: [address] [place] [address] [phone]         Erick Colace, MD Follow up.   Specialty: Physical Medicine and Rehabilitation Why: office will call you to arrange your appt (sent) Contact information: [address] [place]  [address] [phone]         Tonny Bollman, MD Follow up.   Specialty: Cardiology Why: Call the office in 1-2 days to make arrangements for hospital follow-up appointment. Contact information: [address] [place] [address] [phone]         [facility] Follow up.   Why: Call the office in 1-2 days to make arrangements for hospital follow-up appointment. Contact information: [address]     [address] [place] [address] [phone]                Signed: Milinda Antis 08/25/2022, 10:00 AM

## 2022-08-22 NOTE — Progress Notes (Addendum)
Physical Therapy Session Note  Patient Details  Name: [name] MRN: [id number] Date of Birth: 07-Jan-1966  Today's Date: 08/22/2022 PT Individual Time: 1003-1043 PT Individual Time Calculation (min): 40 min   Short Term Goals: Week 1:  PT Short Term Goal 1 (Week 1): STG = LTG d/t ELOS  Skilled Therapeutic Interventions/Progress Updates:    Pt presents in room in bed, indicates agreeable to PT, pt wife [name] present at bedside. Session focused on gait training over uneven surfaces, up/down stairs, with directional cueing as well as therapeutic activity for word finding and cognitive dual tasking with gait. Pt completes all transfers and bed mobility independently throughout session.  Pt ambulates inside/outside hospital 0.92 mi for total time of 30:15 min within hospital up/down 4 flights of stairs, and outside over sidewalk, up/down ramps, over mulch, gravel. Pt provided with cues for safety and completes indoor ambulation over even surfaces independently, ambulates outdoors over uneven ground with distant supervision only for safety cues with navigating street and stairs as well as directional cueing.   Pt takes multiple standing rest breaks during ambulation for recall and word finding as well as visual scanning with therapist directing pt attention to an object and pt asked to recall name of object as well as color of object, requires choice of two with pt able to repeat correct choice 50% of time. Pt tending to repeat latter choice. Pt provided with recall of similar objects throughout walk (tree, sign, car, traffic cone, trash can, yellow, pink, red). Completed to promote improved dual tasking with ambulation as well as word finding.  Pt returns to room and remains supine in bed with all needs met (provided with wet washcloth to wipe off sweat as well as ice water to encourage fluid intake) and wife at bedside at end of session.  Therapy Documentation Precautions:   Precautions Precautions: Fall, Other (comment) Precaution Comments: 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, loop recorder - keep cellphone with pt Restrictions Weight Bearing Restrictions: No  Therapy/Group: Individual Therapy  Edwin Cap PT, DPT 08/22/2022, 10:46 AM

## 2022-08-22 NOTE — Progress Notes (Signed)
PROGRESS NOTE   Subjective/Complaints:  Remains severely aphasic , mod I in room   ROS: Limited due to communication/aphasia Objective:   No results found. Recent Labs    08/22/22 0546  WBC 13.7*  HGB 12.2*  HCT 36.4*  PLT 234    Recent Labs    08/22/22 0546  NA 137  K 4.1  CL 107  CO2 20*  GLUCOSE 122*  BUN 49*  CREATININE 3.22*  CALCIUM 8.8*     Intake/Output Summary (Last 24 hours) at 08/22/2022 0939 Last data filed at 08/22/2022 0737 Gross per 24 hour  Intake 692 ml  Output --  Net 692 ml         Physical Exam: Vital Signs Blood pressure (!) 140/88, pulse 81, temperature 98.9 F (37.2 C), temperature source Oral, resp. rate 16, height 6' (1.829 m), weight 92.4 kg, SpO2 100 %.   General: No acute distress Mood and affect are appropriate Heart: Regular rate and rhythm no rubs murmurs or extra sounds Lungs: Clear to auscultation, breathing unlabored, no rales or wheezes Abdomen: Positive bowel sounds, soft nontender to palpation, nondistended Extremities: No clubbing, cyanosis, or edema Skin: No evidence of breakdown, no evidence of rash    Neuro:  Eyes without evidence of nystagmus  Tone is normal without evidence of spasticity Cerebellar exam shows no evidence of ataxia on finger nose finger or heel to shin testing No evidence of trunkal ataxia   Motor strength is 5/5 in bilateral deltoid, biceps, triceps, finger flexors and extensors, wrist flexors and extensors, hip flexors, knee flexors and extensors, ankle dorsiflexors, plantar flexors, invertors and evertors, toe flexors and extensors Following simple commands and using hand gestures for Y/N appropriately but verbal Y/N inaccurate     Assessment/Plan: 1. Functional deficits which require 3+ hours per day of interdisciplinary therapy in a comprehensive inpatient rehab setting. Physiatrist is providing close team supervision and 24  hour management of active medical problems listed below. Physiatrist and rehab team continue to assess barriers to discharge/monitor patient progress toward functional and medical goals  Care Tool:  Bathing    Body parts bathed by patient: Right arm, Left arm, Chest, Abdomen, Front perineal area, Buttocks, Right upper leg, Left upper leg, Face, Left lower leg, Right lower leg         Bathing assist Assist Level: Supervision/Verbal cueing     Upper Body Dressing/Undressing Upper body dressing   What is the patient wearing?: Pull over shirt    Upper body assist Assist Level: Supervision/Verbal cueing    Lower Body Dressing/Undressing Lower body dressing      What is the patient wearing?: Pants     Lower body assist Assist for lower body dressing: Supervision/Verbal cueing     Toileting Toileting    Toileting assist Assist for toileting: Supervision/Verbal cueing     Transfers Chair/bed transfer  Transfers assist     Chair/bed transfer assist level: Supervision/Verbal cueing     Locomotion Ambulation   Ambulation assist      Assist level: Contact Guard/Touching assist Assistive device: No Device Max distance: 400 ft   Walk 10 feet activity   Assist  Assist level: Supervision/Verbal cueing Assistive device: No Device   Walk 50 feet activity   Assist    Assist level: Contact Guard/Touching assist Assistive device: No Device    Walk 150 feet activity   Assist    Assist level: Contact Guard/Touching assist Assistive device: No Device    Walk 10 feet on uneven surface  activity   Assist     Assist level: Contact Guard/Touching assist Assistive device: Other (comment) (no device)   Wheelchair     Assist Is the patient using a wheelchair?: No   Wheelchair activity did not occur: N/A         Wheelchair 50 feet with 2 turns activity    Assist    Wheelchair 50 feet with 2 turns activity did not occur: N/A        Wheelchair 150 feet activity     Assist  Wheelchair 150 feet activity did not occur: N/A       Blood pressure (!) 140/88, pulse 81, temperature 98.9 F (37.2 C), temperature source Oral, resp. rate 16, height 6' (1.829 m), weight 92.4 kg, SpO2 100 %.  Medical Problem List and Plan: 1. 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 S/p tenecteplase, thrombectomy; has global aphasia             -patient may  shower on Monday 4/29             -ELOS/Goals: 5-7 d will touch base with team prior to conf to discuss d/c date    Con't CIR PT, OT and SLP-  2.  Antithrombotics: -DVT/anticoagulation:  Pharmaceutical: Heparin             -antiplatelet therapy: Aspirin and Plavix for three weeks followed by Plavix alone (DAPT started 4/22)   3. Pain Management: Tylenol as needed   4. Mood/Behavior/Sleep: LCSW to evaluate and provide emotional support             -antipsychotic agents: n/a   5. Neuropsych/cognition: This patient is not  capable of making decisions on his  own behalf.   6. Skin/Wound Care: Routine skin care checks   7. Fluids/Electrolytes/Nutrition: Routine Is and Os and follow-up chemistries   8: Hypertension: monitor TID and prn (Home meds: Amlodipine 10 mg, carvedilol 25 mg, lisinopril 10 mg)             -continue carvedilol 12.5 mg BID             -lisinopril now on allergy list due to #15   4/27- usually controlled, however somewhat elevated this Afternoon at 160s systolic- but was 120s-130s- con't to monitor trend  4/28- Doing better- but DBP somewhat elevated- will con't to monitor trend Vitals:   08/21/22 1313 08/21/22 1922  BP: (!) 144/89 (!) 140/88  Pulse: 82 81  Resp: 18 16  Temp: 98.3 F (36.8 C) 98.9 F (37.2 C)  SpO2: 99% 100%    9: Hyperlipidemia: continue statin   10: Global aphasia: SLP eval   11: OSA: continue CPAP at night   12: DM-2: A1c  6.3%; CBGs better off steroids (no home meds)             -carb modified diet             -? DC SSI   4/27- switched to CBGs BID-AC- will let primary team on Monday assess.  13: CKD stage IV: baseline serum  creatinine ~3.5                  Latest Ref Rng & Units 08/19/2022    6:23 AM 08/18/2022    7:12 PM 08/17/2022    7:40 AM  BMP  Glucose 70 - 99 mg/dL 161    096   BUN 6 - 20 mg/dL 50    59   Creatinine 0.45 - 1.24 mg/dL 4.09  8.11  9.14   Sodium 135 - 145 mmol/L 140    137   Potassium 3.5 - 5.1 mmol/L 4.1    4.2   Chloride 98 - 111 mmol/L 110    107   CO2 22 - 32 mmol/L 21    20   Calcium 8.9 - 10.3 mg/dL 8.8    8.3     7/82- Cr 3.33- will recheck Monday- BUN 50- both are better than when admitted to acute hospital 14: PFO: Spencer grade 5 s/p TEE- per Dr Excell Seltzer will f/u in office ~50mo, if no Afib, likely will need PFO closure    15: Angioedema s/p tenecteplase admin>>resolved   16: Anemia; mild: follow-up CBC    17.  Receptive aphasia plus severe speech apraxia  4/28- asked nursing to get communication board to see if he could use   18. Will d/c IV since not using    LOS: 4 days A FACE TO FACE EVALUATION WAS PERFORMED  Erick Colace 08/22/2022, 9:39 AM

## 2022-08-22 NOTE — Progress Notes (Signed)
Occupational Therapy Session Note  Patient Details  Name: [name] MRN: [id number] Date of Birth: 1966-03-01  Today's Date: 08/22/2022 OT Individual Time: 4098-1191 OT Individual Time Calculation (min): 72 min  OT Individual Time: 4782-9562 OT Individual Time Calculation (min): 42 min   Short Term Goals: Week 1:  OT Short Term Goal 1 (Week 1): STG=LTG d/t Pt ELOS  Skilled Therapeutic Interventions/Progress Updates:     Session 1: Pt received sitting up bed with wife present in room. Pt continuing to present with expressive aphasia this session. Pt presenting with 0/10 pain- intermittent rest breaks, repositioning, and encouragement provided throughout session. Pt requesting to take shower this AM. Pt able to retrieve clothing items from low drawers and safety set up environment for shower retrieving soap, towels, and wash cloth without AD. Sat on TTB to doff clothing without VB cuing required. Pt stood for entirety of shower without SOB or fatigue noted distant supervision. Pt continuing to state "yeah" to all yes/no questions, however Pt's body language and/or wife reporting "no" to some questions. Following shower, Pt able to apply lotion in standing/sitting without fatigue and don clothing mod I. Pt stood at sink to complete grooming/hygiene tasks- shaving head, brushing teeth, and applying deodorant with supervision requiring min cueing for safety for pacing activity. Worked on item identification while standing at sink using mirror to provide visual feedback for letterer articulation and OT providing visual modeling. Utilized Engineer, maintenance to identify 3/3 objects. Provided intermittent rest breaks of listening to familiar songs with increase return of familiar phrases noted while singing along to song. Pt able to verbalize name with min prompting 5x during session. Pt ambulated to therapy gym for remainder of session without AD. Pt engaged in dual tasking activity  completing U/LB endurance exercises on NuStep to work on activity tolerance while counting to 100. Pt often stating "2 thousand" when arriving to next interval of ten requiring mod cueing to transition to stating next "thirty", "forty", etc. Pt also starting over when arriving at 12 for first two attempts. Pt tearful during session using dep breathing techniques for emotional regulation. Pt ambulated back to room mod I. Pt left resting in bed at end of session with call bell in reach, wife present in room, and all needs met. No need for bed alarm as Pt was made mod I in room this session and is safe to ambulate in room without AD.   Session 2:   Pt received sitting up bed with wife present in room. Pt continuing to present with expressive aphasia this session. Pt presenting with 0/10 pain- intermittent rest breaks, repositioning, and encouragement provided throughout session. Pt ambulated to therapy gym without AD mod I. Engaged pt in series of functional cognition activities for aphasia recovery with focus on object and word recognition. Worked on Civil engineer, contracting and STM of simple words and objects. Pt able to repeat words after therapist, however when reading word requiring max A to break words down into syllables for reading task. Pt requiring min-mod A to identity objects using written words or following therapist verbal request. Pt using breathing strategy for emotional regulation and provided break following. Focused remainder of session on single letter recognition and writing letters following. Pt able to point to correct letter when requested ~50% of time. Pt continuing to demonstrate challenges recognizing and then verbalizing letter name. Pt ambulated back to room without AD mod I.Pt left resting in bed at end of session with call bell  in reach, wife present in room, and all needs met. No need for bed alarm as Pt is mod I in room and is safe to ambulate in room without AD.   Therapy  Documentation Precautions:  Precautions Precautions: Fall, Other (comment) Precaution Comments: 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, loop recorder - keep cellphone with pt Restrictions Weight Bearing Restrictions: No  Therapy/Group: Individual Therapy  Army Fossa 08/22/2022, 7:47 AM

## 2022-08-22 NOTE — Progress Notes (Signed)
Speech Language Pathology Daily Session Note  Patient Details  Name: [name] MRN: [id number] Date of Birth: 06/14/1965  Today's Date: 08/22/2022 SLP Individual Time: 1330-1418 SLP Individual Time Calculation (min): 48 min  Short Term Goals: Week 1: SLP Short Term Goal 1 (Week 1): STG=LTGs due to ELOS  Skilled Therapeutic Interventions: Skilled treatment session focused on functional communication. Upon arrival, patient was asleep in bed but easily awakened. SLP facilitated session by providing Mod A phonemic and visual cues for patient to self-monitor and correct phonemic errors during a naming task from a field of 5 in which patient was able to switch between all items multiple times. Patient impulsive with verbal expression with perseveration noted requiring frequent verbal and nonverbal cues to slow down. Patient also verbalized the names of family members from a field of 5 with Mod A multimodal cues. Patient's reading comprehension also appeared significantly reduced throughout task. During informal conversation, patient utilized gestures, verbal expression and written expression (minimally helpful due to multiple errors) to assist with communicating mildly abstract thoughts with SLP comprehending ~75% of information. Patient left upright in bed with alarm on and all needs within reach. Continue with current plan of care.      Pain No/Denies Pain   Therapy/Group: Individual Therapy  [name] 08/22/2022, 3:31 PM

## 2022-08-23 ENCOUNTER — Other Ambulatory Visit (HOSPITAL_COMMUNITY): Payer: Self-pay

## 2022-08-23 DIAGNOSIS — I63512 Cerebral infarction due to unspecified occlusion or stenosis of left middle cerebral artery: Secondary | ICD-10-CM | POA: Diagnosis not present

## 2022-08-23 LAB — GLUCOSE, CAPILLARY
Glucose-Capillary: 121 mg/dL — ABNORMAL HIGH (ref 70–99)
Glucose-Capillary: 112 mg/dL — ABNORMAL HIGH (ref 70–99)
Glucose-Capillary: 92 mg/dL (ref 70–99)

## 2022-08-23 MED ORDER — ASPIRIN 81 MG PO TABS
81.0000 mg | ORAL_TABLET | Freq: Every day | ORAL | 0 refills | Status: AC
  Filled 2022-08-23: qty 11, 11d supply, fill #0

## 2022-08-23 MED ORDER — CARVEDILOL 12.5 MG PO TABS
12.5000 mg | ORAL_TABLET | Freq: Two times a day (BID) | ORAL | 0 refills | Status: DC
  Filled 2022-08-23: qty 60, 30d supply, fill #0

## 2022-08-23 MED ORDER — ATORVASTATIN CALCIUM 20 MG PO TABS
20.0000 mg | ORAL_TABLET | Freq: Every day | ORAL | 0 refills | Status: AC
  Filled 2022-08-23: qty 30, 30d supply, fill #0

## 2022-08-23 MED ORDER — CLOPIDOGREL BISULFATE 75 MG PO TABS
75.0000 mg | ORAL_TABLET | Freq: Every day | ORAL | 0 refills | Status: DC
  Filled 2022-08-23: qty 30, 30d supply, fill #0

## 2022-08-23 NOTE — Progress Notes (Signed)
Patient ID: [name], male   DOB: June 02, 1965, 57 y.o.   MRN: [id number]  OP referral faxed to [place]

## 2022-08-23 NOTE — Plan of Care (Signed)
  Problem: RH Bathing Goal: LTG Patient will bathe all body parts with assist levels (OT) Description: LTG: Patient will bathe all body parts with assist levels (OT) Outcome: Completed/Met   Problem: RH Dressing Goal: LTG Patient will perform lower body dressing w/assist (OT) Description: LTG: Patient will perform lower body dressing with assist, with/without cues in positioning using equipment (OT) Outcome: Completed/Met   Problem: RH Toileting Goal: LTG Patient will perform toileting task (3/3 steps) with assistance level (OT) Description: LTG: Patient will perform toileting task (3/3 steps) with assistance level (OT)  Outcome: Completed/Met   Problem: RH Simple Meal Prep Goal: LTG Patient will perform simple meal prep w/assist (OT) Description: LTG: Patient will perform simple meal prep with assistance, with/without cues (OT). Outcome: Completed/Met   Problem: RH Light Housekeeping Goal: LTG Patient will perform light housekeeping w/assist (OT) Description: LTG: Patient will perform light housekeeping with assistance, with/without cues (OT). Outcome: Completed/Met   Problem: RH Toilet Transfers Goal: LTG Patient will perform toilet transfers w/assist (OT) Description: LTG: Patient will perform toilet transfers with assist, with/without cues using equipment (OT) Outcome: Completed/Met   Problem: RH Tub/Shower Transfers Goal: LTG Patient will perform tub/shower transfers w/assist (OT) Description: LTG: Patient will perform tub/shower transfers with assist, with/without cues using equipment (OT) Outcome: Completed/Met

## 2022-08-23 NOTE — Progress Notes (Signed)
Physical Therapy Discharge Summary  Patient Details  Name: [name] MRN: [id number] Date of Birth: 20-Jun-1965  Date of Discharge from PT service:August 23, 2022  Today's Date: 08/23/2022 PT Individual Time: 1324-4010 PT Individual Time Calculation (min): 42 min    Patient has met 7 of 7 long term goals due to improved activity tolerance, improved balance, ability to compensate for deficits, improved attention, and improved awareness.  Patient to discharge at an ambulatory level Independent without AD. Patient's care partner is independent to provide the necessary cognitive assistance at discharge.  All goals met.  Recommendation:  No follow-up PT services recommended as pt independent with all mobility.  Equipment: No equipment provided, none needed  Reasons for discharge: treatment goals met and discharge from hospital  Patient/family agrees with progress made and goals achieved: Yes  Skilled Therapeutic Interventions/Progress Updates:  Pt received supine in bed and agreeable to therapy session. Notified nurse of plan to go off unit for session. Supine<>sit independently. Sit<>stands, no AD, independently throughout session. Gait training >1,037ft independently indoors to get outside while participating in language task of following therapist's instructions for navigation - pt unable to understand "four" or "g" to select floor number in elevator without visual gestures to go along with the words. Gait training outside on paved, unlevel surfaces, up/down 2 flights of stairs using at least 1 HR all independently without signs of instability while participating in language task of stating names of familiar objects including: lamp, stairs, rail, tree, bush, flower, trash can, truck etc. Pt working on cardiopulmonary endurance for gait ambulating without seated rest break totaling 0.56mi. Transitioned to busier environment of hospital cafeteria to have pt verbalize names of  familiar items including: banana, cookie (really challenging word), cake, coffee, waffle, chips, etc. Pt starts gesturing and trying to communicate something and therapist believes that he was trying to express that it is really challenging for him to try and learn how to say words in this busier environment with more auditory and visual stimuli. Therapist provided emotional support and pt ambulated back up to CIR to move away from the overstimulating environment. At end of session, pt left seated in bed with needs in reach - pt requires assistance to turn on TV due to inability to problem solve how.   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 Does not apply - I have not had any pain or hurting in the past 5 days (pt reports no pain recently) Pain Interference with Therapy Activities: 1. Rarely or not at all Pain Interference with Day-to-Day Activities: 1. Rarely or not at all Vision/Perception  Vision - History Ability to See in Adequate Light: 0 Adequate Perception Perception: Within Functional Limits Praxis Praxis: Intact  Cognition Overall Cognitive Status: Difficult to assess (due to severe aphasia and speech apraxia) Arousal/Alertness: Awake/alert Orientation Level: Oriented X4 (when given choice of 2 can select correct answer) Attention: Focused;Sustained Focused Attention: Appears intact Sustained Attention: Appears intact Safety/Judgment: Appears intact Sensation Sensation Light Touch: Appears Intact Hot/Cold: Not tested Proprioception: Appears Intact Stereognosis: Not tested Coordination Gross Motor Movements are Fluid and Coordinated: Yes Motor  Motor Motor: Within Functional  Limits  Mobility Bed Mobility Bed Mobility: Sit to Supine;Supine to Sit Supine to Sit: Independent Sit to Supine: Independent Transfers Transfers: Sit to Stand;Stand  to Sit;Stand Pivot Transfers Sit to Stand: Independent Stand to Sit: Independent Stand Pivot Transfers: Independent Transfer (Assistive device): None Locomotion  Gait Ambulation: Yes Gait Assistance: Independent Gait Distance (Feet): 1000 Feet Assistive device: None Gait Gait: Yes Gait Pattern: Within Functional Limits Stairs / Additional Locomotion Stairs: Yes Stairs Assistance: Independent with assistive device Stair Management Technique: Alternating pattern;One rail Right;One rail Left Number of Stairs: 20 Height of Stairs: 6 Ramp: Independent Curb: Independent Pick up small object from the floor assist level: Independent Wheelchair Mobility Wheelchair Mobility: No  Trunk/Postural Assessment  Cervical Assessment Cervical Assessment: Within Functional Limits Thoracic Assessment Thoracic Assessment: Within Functional Limits Lumbar Assessment Lumbar Assessment: Within Functional Limits Postural Control Postural Control: Within Functional Limits  Balance Balance Balance Assessed: Yes Static Sitting Balance Static Sitting - Balance Support: Feet supported Static Sitting - Level of Assistance: 7: Independent Dynamic Sitting Balance Dynamic Sitting - Balance Support: No upper extremity supported;Feet supported Dynamic Sitting - Level of Assistance: 7: Independent Static Standing Balance Static Standing - Balance Support: During functional activity Static Standing - Level of Assistance: 7: Independent Dynamic Standing Balance Dynamic Standing - Balance Support: During functional activity Dynamic Standing - Level of Assistance: 7: Independent Extremity Assessment      RLE Assessment RLE Assessment: Within Functional Limits General Strength Comments: 5/5 throughout assessed in sitting LLE  Assessment LLE Assessment: Within Functional Limits General Strength Comments: 5/5 throughout assessed in sitting   Ginny Forth , PT, DPT, NCS, CSRS 08/23/2022, 3:48 PM

## 2022-08-23 NOTE — Plan of Care (Deleted)
  Problem: RH Comprehension Communication Goal: LTG Patient will comprehend basic/complex auditory (SLP) Description: LTG: Patient will comprehend basic/complex auditory information with cues (SLP). Outcome: Not Met (add Reason) Flowsheets (Taken 08/23/2022 1549) LTG: Patient will comprehend: Basic auditory information LTG: Patient will comprehend auditory information with cueing (SLP): Moderate Assistance - Patient 50 - 74% Note: Pt with slower than anticipated progress requiring > amount of assistance   Problem: RH Expression Communication Goal: LTG Patient will express needs/wants via multi-modal(SLP) Description: LTG:  Patient will express needs/wants via multi-modal communication (gestures/written, etc) with cues (SLP) Outcome: Not Met (add Reason) Flowsheets (Taken 08/23/2022 1555) LTG: Patient will express needs/wants via multimodal communication (gestures/written, etc) with cueing (SLP): Moderate Assistance - Patient 50 - 74% Note: Pt with slower than anticipated progress requiring > amount of assistance Goal: LTG Patient will verbally express basic/complex needs(SLP) Description: LTG:  Patient will verbally express basic/complex needs, wants or ideas with cues  (SLP) Outcome: Not Met (add Reason) Flowsheets (Taken 08/23/2022 1552) LTG: Patient will verbally express basic/complex needs, wants or ideas (SLP): Maximal Assistance - Patient 25 - 49% Note: Pt with slower than anticipated progress requiring > amount of assistance Goal: LTG Patient will increase word finding of common (SLP) Description: LTG:  Patient will increase word finding of common objects/daily info/abstract thoughts with cues using compensatory strategies (SLP). Outcome: Not Met (add Reason) Flowsheets (Taken 08/23/2022 1552) LTG: Patient will increase word finding of common (SLP): Maximal Assistance - Patient 25 - 49% Patient will use compensatory strategies to increase word finding of: Common objects Note: Pt with  slower than anticipated progress requiring > amount of assistance

## 2022-08-23 NOTE — Patient Care Conference (Incomplete)
Inpatient RehabilitationTeam Conference and Plan of Care Update Date: 08/24/2022   Time: 8:14 PM    Patient Name: [name]      Medical Record Number: [id number]  Date of Birth: 11-03-1965 Sex: Male         Room/Bed: 4W10C/4W10C-01 Payor Info: Payor: [employer] / Plan: [facility] PPO / Product Type: *No Product type* /    Admit Date/Time:  08/18/2022  6:48 PM  Primary Diagnosis:  Acute ischemic left MCA stroke [facility])  Hospital Problems: Principal Problem:   Acute ischemic left MCA stroke [facility])    Expected Discharge Date: Expected Discharge Date: 08/24/22  Team Members Present: Physician leading conference: Dr. Claudette Laws Social Worker Present: Lavera Guise, BSW Nurse Present: Chana Bode, RN PT Present: Other (comment) Edwin Cap PT) OT Present: Other (comment) Bonnell Public, OT) SLP Present: Feliberto Gottron, SLP     Current Status/Progress Goal Weekly Team Focus  Bowel/Bladder      Continent of bowel and bladder          Swallow/Nutrition/ Hydration               ADL's   Mod I in all BADLs and supervision IADLs   mod I-supervision   aphasia recovery, functional cognition, family education, d/c planning, activity tolerance, BADL and IADL retraining    Mobility   independent bed mobility, independent transfers and gait without AD, independent stair navigation using 1 HR, independent gait in community setting   independent ambulatory  dual task training, cardiopulmonary endurance training, dynamic gait training, community reintegration, pt/family education    Communication   mod to max A for receptive and expressive language   min A   Focused on answering biographical questions, naming of common objects, identification of common objects, and writing.    Safety/Cognition/ Behavioral Observations               Pain      N/a          Skin      Dressing intact to loop recorder insertion site   Incision  healing    Assess skin q shift    Discharge Planning:  Patient discharging home tomorrow with spouse and children to assist. OP reccomendations send to OP at [facility]   Team Discussion: Doing well physically; remains severely aphasic post left frontal parietal/MCA CVA. SLP reports impulsive with verbal expression with perseveration noted requiring frequent verbal and nonverbal cues to slow down   Patient on target to meet rehab goals: yes  *See Care Plan and progress notes for long and short-term goals.   Revisions to Treatment Plan:  Patient mod I in the room in prep for discharge   Teaching Needs: Safety, medications, dietary modifications, skin care, loop recorder care/monitoring, etc.   Current Barriers to Discharge: Decreased caregiver support  Possible Resolutions to Barriers: Family education OP SLP follow up services No DME     Medical Summary Current Status: BP controlled , sewvere aphasia but minimal motor impairment in limbs  Barriers to Discharge: Other (comments)  Barriers to Discharge Comments: aphasia Possible Resolutions to Barriers/Weekly Focus: discharge in am , refer to OP SLP   Continued Need for Acute Rehabilitation Level of Care: The patient requires daily medical management by a physician with specialized training in physical medicine and rehabilitation for the following reasons: Direction of a multidisciplinary physical rehabilitation program to maximize functional independence : Yes Medical management of patient stability for increased activity during  participation in an intensive rehabilitation regime.: Yes Analysis of laboratory values and/or radiology reports with any subsequent need for medication adjustment and/or medical intervention. : Yes   I attest that I was present, lead the team conference, and concur with the assessment and plan of the team.   Chana Bode B 08/24/2022, 8:14 PM

## 2022-08-23 NOTE — Progress Notes (Addendum)
Patient ID: [name], male   DOB: 1965/08/18, 57 y.o.   MRN: [id number]  SW spoke with spouse, [name] to discuss OP recommendations. Sw provided medical records contact information to spouse. No additional questions or concerns. Sw will follow up with spouse for additional recommendations if needed.

## 2022-08-23 NOTE — Progress Notes (Signed)
Inpatient Rehabilitation Care Coordinator Discharge Note   Patient Details  Name: TEOFILO LUPINACCI MRN: [id number] Date of Birth: 10-30-1965   Discharge location: Home  Length of Stay: 6 days  Discharge activity level: supervision  Home/community participation: spouse, daughter and son  Patient response AO:ZHYQMV Literacy - How often do you need to have someone help you when you read instructions, pamphlets, or other written material from your doctor or pharmacy?: Patient unable to respond  Patient response HQ:IONGEX Isolation - How often do you feel lonely or isolated from those around you?: Patient unable to respond  Services provided included: MD, RD, PT, OT, SLP, RN, CM, TR, Pharmacy, SW  Financial Services:  Field seismologist Utilized: Private Insurance [employer]  Choices offered to/list presented to: Spouse and pt  Follow-up services arranged:  Outpatient, DME    Outpatient Servicies: [facility] - PT OT SLP DME : NONE    Patient response to transportation need: Is the patient able to respond to transportation needs?: No In the past 12 months, has lack of transportation kept you from medical appointments or from getting medications?: No In the past 12 months, has lack of transportation kept you from meetings, work, or from getting things needed for daily living?: No   Patient/Family verbalized understanding of follow-up arrangements:  Yes  Individual responsible for coordination of the follow-up plan: spouse  Confirmed correct DME delivered: Andria Rhein 08/23/2022    Comments (or additional information):  Summary of Stay    Date/Time Discharge Planning CSW  08/23/22 1455 Patient discharging home tomorrow with spouse and children to assist. OP reccomendations send to OP at [place] [facility]       Andria Rhein

## 2022-08-23 NOTE — Progress Notes (Signed)
Speech Language Pathology Discharge Summary  Patient Details  Name: [name] MRN: [id number] Date of Birth: 31-Mar-1966  Date of Discharge from SLP service:August 23, 2022  Today's Date: 08/23/2022 SLP Individual Time: 0200-0245 [id number] SLP Individual Time Calculation (min): 45 min; 45 min   Skilled Therapeutic Interventions:   Session 1: Pt was seen in am to address expressive and receptive language. Pt was alert upon SLP arrival with wife present at bedside. Session transitioned to ST office with wife accompanying. SLP addressing functional communication with pt answering biographical questions including; name, DOB, age, city, family members, and job intermittently throughout session. Pt required range of min to mod A via phonemic and syllabic cues across session. SLP further addressed pt completion of automatic phrases which included; months of year and days of week. In response pt identified current day and day of discharge in multiple attempts across session. In additional minutes of session, SLP addressed naming of functional items given the Language Activity Resource Kit [facility]). Pt named 4 items given mod to max A including phonemic and semantic cues switching between all 4 multiple times. Pt returned back to his room with wife present.   Session 2: Pt was seen in PM to address expressive and receptive language. Pt was encountered in his room and transitioned to ST office for completion of session. Pt with greater amount of perseverations this session requiring cues for cancellation and distraction with inconsistent return. Pt required mod to max A this session for accurate return demo of his name, DOB, family member's names, age, city, and job. Additionally, pt requiring increased cues for pt attempting to write this session and noted with good return writing his name and children's name with a model. In additional minutes of session, SLP addressing identification of written information  given FO2. Pt completed task with 80% acc with min to mod A. SLP provided handouts to supplement knowledge of aphasia and communication tips. Pt returned to his room with call button within reach. SLP to continue POC.  Patient has met 1 of 4 long term goals.  Patient to discharge at overall Mod;Max level.  Reasons goals not met: Pt with slower than anticipated progress requiring > amount of assistance   Clinical Impression/Discharge Summary: Patient has made excellent gains despite meeting 1 of 4 LTG's this admission. Pt with slower than anticipated progress requiring > amount of assistance however, pt did make progress toward answering biographical questions, identification of objects, and writing. Pt is curently an overall mod to max A for expressive and receptive language. Pt/ family education complete and pt willl discharge home with 24 hour supervision from family. Pt would beneift from outpatient SLP services to maximize expressive and receptive language in order to maximize his functional independence and reduce caregiver burden.  Care Partner:  Caregiver Able to Provide Assistance: Yes  Type of Caregiver Assistance: Cognitive  Recommendation:  Outpatient SLP  Rationale for SLP Follow Up: Maximize functional communication   Equipment: OPSLP   Reasons for discharge: Treatment goals met;Discharged from hospital   Patient/Family Agrees with Progress Made and Goals Achieved: Yes    Renaee Munda 08/23/2022, 4:07 PM

## 2022-08-23 NOTE — Plan of Care (Signed)
  Problem: RH Comprehension Communication Goal: LTG Patient will comprehend basic/complex auditory (SLP) Description: LTG: Patient will comprehend basic/complex auditory information with cues (SLP). 08/23/2022 1626 by Renaee Munda, CCC-SLP Outcome: Completed/Met Flowsheets (Taken 08/23/2022 1549) LTG: Patient will comprehend auditory information with cueing (SLP): Moderate Assistance - Patient 50 - 74% 08/23/2022 1555 by Renaee Munda, CCC-SLP Outcome: Not Met (add Reason) Flowsheets (Taken 08/23/2022 1549) LTG: Patient will comprehend: Basic auditory information LTG: Patient will comprehend auditory information with cueing (SLP): Moderate Assistance - Patient 50 - 74% Note: Pt with slower than anticipated progress requiring > amount of assistance   Problem: RH Expression Communication Goal: LTG Patient will express needs/wants via multi-modal(SLP) Description: LTG:  Patient will express needs/wants via multi-modal communication (gestures/written, etc) with cues (SLP) Outcome: Not Met (add Reason) Flowsheets (Taken 08/23/2022 1555) LTG: Patient will express needs/wants via multimodal communication (gestures/written, etc) with cueing (SLP): Moderate Assistance - Patient 50 - 74% Note: Pt with slower than anticipated progress requiring > amount of assistance Goal: LTG Patient will verbally express basic/complex needs(SLP) Description: LTG:  Patient will verbally express basic/complex needs, wants or ideas with cues  (SLP) Outcome: Not Met (add Reason) Flowsheets (Taken 08/23/2022 1552) LTG: Patient will verbally express basic/complex needs, wants or ideas (SLP): Maximal Assistance - Patient 25 - 49% Note: Pt with slower than anticipated progress requiring > amount of assistance Goal: LTG Patient will increase word finding of common (SLP) Description: LTG:  Patient will increase word finding of common objects/daily info/abstract thoughts with cues using compensatory strategies (SLP). Outcome:  Not Met (add Reason) Flowsheets (Taken 08/23/2022 1552) LTG: Patient will increase word finding of common (SLP): Maximal Assistance - Patient 25 - 49% Patient will use compensatory strategies to increase word finding of: Common objects Note: Pt with slower than anticipated progress requiring > amount of assistance

## 2022-08-23 NOTE — Progress Notes (Signed)
Occupational Therapy Session Note  Patient Details  Name: [name] MRN: [id number] Date of Birth: Mar 19, 1966  Today's Date: 08/23/2022 OT Individual Time: 2952-8413 OT Individual Time Calculation (min): 72 min    Short Term Goals: Week 1:  OT Short Term Goal 1 (Week 1): STG=LTG d/t Pt ELOS  Skilled Therapeutic Interventions/Progress Updates:     Pt received sitting up in bed presenting to be in good spirits reporting 0/10 pain- OT offering intermittent rest breaks, repositioning, and therapeutic support maximal participation in session. MD in/out at beginning of session for morning rounding. Focus this session aphasia recovery with emphasis on word finding and identifying familiar objects. Pt ambulated to therapy gym independently without AD. While in therapy gym, worked on Veterinary surgeon of identifying familiar objects. Focused on "S" and "B" words with Pt able to pronounce first letter of each words following therapist modeling and state name of both letters. Pt able to match pictures of objects to written name of objects with min questioning cues. Worked on breaking down each word into syllables and then stating each word with massed practice. Pt able to verbalize each object in order, however when challenge increased and pictures placed in different order, Pt requiring mod A to recall name of objects correctly. Provided rest breaks with listening to Pt desired music with Pt spontaneously stating song lyrics and phrases. Pt able to state name x3 during session with increased time and maximal effort. Worked on stating days of the week and recognizing written names with Pt able to accurately point and state names of the week with ~75% accuracy. Pt then able to state days of the week in order x2 trials. Pt engaged in dual tasking basketball activity with pt instructed to count number of baskets he made with Pt able to recall, attend, and correctly count out number of baskets without assistance  required. Reviewed "B" and "S" words at end of session using pictures flashcards with Pt able to correctly verbalize 6/6 items with increased time and multiple attempts. Pt left resting in bed, with call bell in reach, no alarm on, and all needs met.   Therapy Documentation Precautions:  Precautions Precautions: Fall, Other (comment) Precaution Comments: 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, loop recorder - keep cellphone with pt Restrictions Weight Bearing Restrictions: No Pain: Pain Assessment Pain Scale: 0-10 Pain Score: 0-No pain ADL: ADL Eating: Independent Where Assessed-Eating: Edge of bed Grooming: Independent Where Assessed-Grooming: Standing at sink Upper Body Bathing: Independent Where Assessed-Upper Body Bathing: Shower Lower Body Bathing: Independent Where Assessed-Lower Body Bathing: Shower Upper Body Dressing: Independent Where Assessed-Upper Body Dressing: Edge of bed Lower Body Dressing: Independent Where Assessed-Lower Body Dressing: Edge of bed Toileting: Independent Where Assessed-Toileting: Teacher, adult education: Community education officer Method: Proofreader: Chiropractor Transfer: Designer, industrial/product Method: Nutritional therapist Transfer: Psychologist, counselling Method: Designer, industrial/product: Grab bars Vision Baseline Vision/History: 1 Wears glasses Patient Visual Report: Other (comment) (unable to report d/t aphasia) Vision Assessment?: Yes Eye Alignment: Within Functional Limits Ocular Range of Motion: Within Functional Limits Alignment/Gaze Preference: Within Defined Limits Tracking/Visual Pursuits: Able to track stimulus in all quads without difficulty Saccades: Within functional limits Convergence: Within functional limits Visual Fields: No apparent deficits Perception  Perception: Within Functional Limits Praxis Praxis: Intact Balance Balance Balance Assessed:  Yes Static Sitting Balance Static Sitting - Balance Support: Feet supported Static Sitting - Level of Assistance: 7: Independent Dynamic Sitting Balance Dynamic Sitting - Balance Support: No upper extremity  supported;Feet supported Dynamic Sitting - Level of Assistance: 7: Independent Static Standing Balance Static Standing - Balance Support: During functional activity Static Standing - Level of Assistance: 7: Independent Dynamic Standing Balance Dynamic Standing - Balance Support: During functional activity Dynamic Standing - Level of Assistance: 7: Independent  Therapy/Group: Individual Therapy  Army Fossa 08/23/2022, 12:10 PM

## 2022-08-23 NOTE — Progress Notes (Signed)
PROGRESS NOTE   Subjective/Complaints:  Discussed d/c date with team and with pt.  Labs and vitals reviewed Discussed d/c planning with wife who wishes to speak with SW  ROS: Limited due to communication/aphasia Objective:   No results found. Recent Labs    08/22/22 0546  WBC 13.7*  HGB 12.2*  HCT 36.4*  PLT 234    Recent Labs    08/22/22 0546  NA 137  K 4.1  CL 107  CO2 20*  GLUCOSE 122*  BUN 49*  CREATININE 3.22*  CALCIUM 8.8*     Intake/Output Summary (Last 24 hours) at 08/23/2022 0924 Last data filed at 08/22/2022 1758 Gross per 24 hour  Intake 714 ml  Output --  Net 714 ml         Physical Exam: Vital Signs Blood pressure 123/89, pulse 70, temperature 98.1 F (36.7 C), temperature source Oral, resp. rate 18, height 6' (1.829 m), weight 92.4 kg, SpO2 100 %.   General: No acute distress Mood and affect are appropriate Heart: Regular rate and rhythm no rubs murmurs or extra sounds Lungs: Clear to auscultation, breathing unlabored, no rales or wheezes Abdomen: Positive bowel sounds, soft nontender to palpation, nondistended Extremities: No clubbing, cyanosis, or edema Skin: No evidence of breakdown, no evidence of rash    Neuro:  Eyes without evidence of nystagmus  Tone is normal without evidence of spasticity Cerebellar exam shows no evidence of ataxia on finger nose finger or heel to shin testing No evidence of trunkal ataxia   Motor strength is 5/5 in bilateral deltoid, biceps, triceps, finger flexors and extensors, wrist flexors and extensors, hip flexors, knee flexors and extensors, ankle dorsiflexors, plantar flexors, invertors and evertors, toe flexors and extensors Following simple commands has problems with naming but can state his own name with cuing      Assessment/Plan: 1. Functional deficits which require 3+ hours per day of interdisciplinary therapy in a comprehensive  inpatient rehab setting. Physiatrist is providing close team supervision and 24 hour management of active medical problems listed below. Physiatrist and rehab team continue to assess barriers to discharge/monitor patient progress toward functional and medical goals  Care Tool:  Bathing    Body parts bathed by patient: Right arm, Left arm, Chest, Abdomen, Front perineal area, Buttocks, Right upper leg, Left upper leg, Face, Left lower leg, Right lower leg         Bathing assist Assist Level: Supervision/Verbal cueing     Upper Body Dressing/Undressing Upper body dressing   What is the patient wearing?: Pull over shirt    Upper body assist Assist Level: Supervision/Verbal cueing    Lower Body Dressing/Undressing Lower body dressing      What is the patient wearing?: Pants     Lower body assist Assist for lower body dressing: Supervision/Verbal cueing     Toileting Toileting    Toileting assist Assist for toileting: Supervision/Verbal cueing     Transfers Chair/bed transfer  Transfers assist     Chair/bed transfer assist level: Supervision/Verbal cueing     Locomotion Ambulation   Ambulation assist      Assist level: Contact Guard/Touching assist Assistive device: No Device Max  distance: 400 ft   Walk 10 feet activity   Assist     Assist level: Supervision/Verbal cueing Assistive device: No Device   Walk 50 feet activity   Assist    Assist level: Contact Guard/Touching assist Assistive device: No Device    Walk 150 feet activity   Assist    Assist level: Contact Guard/Touching assist Assistive device: No Device    Walk 10 feet on uneven surface  activity   Assist     Assist level: Contact Guard/Touching assist Assistive device: Other (comment) (no device)   Wheelchair     Assist Is the patient using a wheelchair?: No             Wheelchair 50 feet with 2 turns activity    Assist            Wheelchair  150 feet activity     Assist          Blood pressure 123/89, pulse 70, temperature 98.1 F (36.7 C), temperature source Oral, resp. rate 18, height 6' (1.829 m), weight 92.4 kg, SpO2 100 %.  Medical Problem List and Plan: 1. 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 S/p tenecteplase, thrombectomy; has global aphasia             -patient may  shower              -ELOS/Goals:d/c in am    Con't CIR PT, OT and SLP-  2.  Antithrombotics: -DVT/anticoagulation:  Pharmaceutical: Heparin             -antiplatelet therapy: Aspirin and Plavix for three weeks followed by Plavix alone (DAPT started 4/22)   3. Pain Management: Tylenol as needed   4. Mood/Behavior/Sleep: LCSW to evaluate and provide emotional support             -antipsychotic agents: n/a   5. Neuropsych/cognition: This patient is not  capable of making decisions on his  own behalf.   6. Skin/Wound Care: Routine skin care checks   7. Fluids/Electrolytes/Nutrition: Routine Is and Os and follow-up chemistries   8: Hypertension: monitor TID and prn (Home meds: Amlodipine 10 mg, carvedilol 25 mg, lisinopril 10 mg)             -continue carvedilol 12.5 mg BID             -lisinopril now on allergy list due to #15   4/27- usually controlled, however somewhat elevated this Afternoon at 160s systolic- but was 120s-130s- con't to monitor trend  4/28- Doing better- but DBP somewhat elevated- will con't to monitor trend Vitals:   08/22/22 2021 08/23/22 0512  BP: (!) 145/101 123/89  Pulse: 87 70  Resp: 18 18  Temp: 98.7 F (37.1 C) 98.1 F (36.7 C)  SpO2: 100% 100%    9: Hyperlipidemia: continue statin   10: Global aphasia: SLP eval   11: OSA: continue CPAP at night   12: DM-2: A1c 6.3%; CBGs better off steroids (no home meds)             -carb modified diet             -? DC SSI   4/27- switched to CBGs BID-AC- will  let primary team on Monday assess.  13: CKD stage IV: baseline serum creatinine ~3.5  Latest Ref Rng & Units 08/19/2022    6:23 AM 08/18/2022    7:12 PM 08/17/2022    7:40 AM  BMP  Glucose 70 - 99 mg/dL 161    096   BUN 6 - 20 mg/dL 50    59   Creatinine 0.45 - 1.24 mg/dL 4.09  8.11  9.14   Sodium 135 - 145 mmol/L 140    137   Potassium 3.5 - 5.1 mmol/L 4.1    4.2   Chloride 98 - 111 mmol/L 110    107   CO2 22 - 32 mmol/L 21    20   Calcium 8.9 - 10.3 mg/dL 8.8    8.3     Stable  14: PFO: Spencer grade 5 s/p TEE- per Dr Excell Seltzer will f/u in office ~47mo, if no Afib, likely will need PFO closure    15: Angioedema s/p tenecteplase admin>>resolved   16: Anemia; mild: follow-up CBC    17.  Receptive aphasia plus severe speech apraxia  OP speech therapy at [facility]   18. Will d/c IV since not using    LOS: 5 days A FACE TO FACE EVALUATION WAS PERFORMED  Erick Colace 08/23/2022, 9:24 AM

## 2022-08-23 NOTE — Progress Notes (Signed)
Inpatient Rehabilitation Discharge Medication Review by a Pharmacist  A complete drug regimen review was completed for this patient to identify any potential clinically significant medication issues.  High Risk Drug Classes Is patient taking? Indication by Medication  Antipsychotic No   Anticoagulant No   Antibiotic No   Opioid No   Antiplatelet Yes Aspirin, Clopidogrel - CVA  Hypoglycemics/insulin No   Vasoactive Medication Yes Carvedilol - HTN  Chemotherapy No   Other Yes Atorvastatin - HLD     Type of Medication Issue Identified Description of Issue Recommendation(s)  Drug Interaction(s) (clinically significant)     Duplicate Therapy     Allergy     No Medication Administration End Date     Incorrect Dose     Additional Drug Therapy Needed     Significant med changes from prior encounter (inform family/care partners about these prior to discharge).    Other       Clinically significant medication issues were identified that warrant physician communication and completion of prescribed/recommended actions by midnight of the next day:  No  Name of provider notified for urgent issues identified:   Provider Method of Notification:     Pharmacist comments: None  Time spent performing this drug regimen review (minutes):  20 minutes  Thank you  Okey Regal, PharmD

## 2022-08-23 NOTE — Progress Notes (Signed)
Occupational Therapy Discharge Summary  Patient Details  Name: [name] MRN: [id number] Date of Birth: Sep 02, 1965  Date of Discharge from OT service:August 23, 2022   Patient has met 7 of 7 long term goals due to improved activity tolerance, ability to compensate for deficits, improved attention, and improved awareness.  Patient to discharge at overall Independent-supervision level.  Patient's care partner is independent to provide the necessary physical and cognitive assistance at discharge.    Reasons goals not met: All goals met   Recommendation:  No further OT services recommended at this time as Pt is at his baseline in BADLs.   Equipment: No equipment provided  Reasons for discharge: treatment goals met and discharge from hospital  Patient/family agrees with progress made and goals achieved: Yes  OT Discharge Precautions/Restrictions  Precautions Precautions: Fall;Other (comment) Precaution Comments: 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, loop recorder - keep cellphone with pt General     Pain  0/10 ADL ADL Eating: Independent Where Assessed-Eating: Edge of bed Grooming: Independent Where Assessed-Grooming: Standing at sink Upper Body Bathing: Independent Where Assessed-Upper Body Bathing: Shower Lower Body Bathing: Independent Where Assessed-Lower Body Bathing: Shower Upper Body Dressing: Independent Where Assessed-Upper Body Dressing: Edge of bed Lower Body Dressing: Independent Where Assessed-Lower Body Dressing: Edge of bed Toileting: Independent Where Assessed-Toileting: Teacher, adult education: Community education officer Method: Proofreader: Chiropractor Transfer: Designer, industrial/product Method: Nutritional therapist Transfer: Psychologist, counselling Method: Designer, industrial/product: Grab bars Vision Baseline Vision/History: 1 Wears glasses Patient Visual Report: Other (comment)  (unable to report d/t aphasia) Vision Assessment?: Yes Eye Alignment: Within Functional Limits Ocular Range of Motion: Within Functional Limits Alignment/Gaze Preference: Within Defined Limits Tracking/Visual Pursuits: Able to track stimulus in all quads without difficulty Saccades: Within functional limits Convergence: Within functional limits Visual Fields: No apparent deficits Perception  Perception: Within Functional Limits Praxis Praxis: Intact Cognition Cognition Overall Cognitive Status: Difficult to assess Arousal/Alertness: Awake/alert Orientation Level: Person;Situation;Place Person: Oriented Place: Oriented Situation: Oriented Memory: Appears intact Focused Attention: Appears intact Sustained Attention: Appears intact Selective Attention: Appears intact Awareness: Appears intact Problem Solving: Appears intact Executive Function:  (appears to be intact, difficult to assess d/t expressive aphasia) Safety/Judgment: Appears intact Brief Interview for Mental Status (BIMS) Repetition of Three Words (First Attempt): 3 (dificult to complete assessment d/t expressive aphasia) Temporal Orientation: Year: Nonsensical Temporal Orientation: Month: Nonsensical Temporal Orientation: Day: Nonsensical Recall: "Sock": Nonsensical Recall: "Blue": Yes, no cue required Recall: "Bed": Nonsensical (May have attempted to say word, but said "red" instead) BIMS Summary Score: 99 Sensation Sensation Light Touch: Appears Intact Hot/Cold: Appears Intact Proprioception: Appears Intact Stereognosis: Not tested Coordination Gross Motor Movements are Fluid and Coordinated: Yes Fine Motor Movements are Fluid and Coordinated: Yes Finger Nose Finger Test: Smooth equal movements BUEs Motor  Motor Motor: Within Functional Limits Mobility  Bed Mobility Bed Mobility: Rolling Right;Rolling Left;Right Sidelying to Sit Rolling Right: Independent Rolling Left: Independent Right Sidelying to  Sit: Independent Transfers Sit to Stand: Independent Stand to Sit: Independent  Trunk/Postural Assessment  Cervical Assessment Cervical Assessment: Within Functional Limits Thoracic Assessment Thoracic Assessment: Within Functional Limits Lumbar Assessment Lumbar Assessment: Within Functional Limits Postural Control Postural Control: Within Functional Limits  Balance Balance Balance Assessed: Yes Static Sitting Balance Static Sitting - Balance Support: Feet supported Static Sitting - Level of Assistance: 7: Independent Dynamic Sitting Balance Dynamic Sitting - Balance Support: No upper extremity supported;Feet supported Dynamic Sitting - Level of Assistance: 7: Independent Static Standing  Balance Static Standing - Balance Support: During functional activity Static Standing - Level of Assistance: 7: Independent Dynamic Standing Balance Dynamic Standing - Balance Support: During functional activity Dynamic Standing - Level of Assistance: 7: Independent Extremity/Trunk Assessment RUE Assessment RUE Assessment: Within Functional Limits LUE Assessment LUE Assessment: Within Functional Limits   Army Fossa 08/23/2022, 9:56 AM

## 2022-08-24 ENCOUNTER — Other Ambulatory Visit (HOSPITAL_COMMUNITY): Payer: Self-pay

## 2022-08-24 DIAGNOSIS — I63512 Cerebral infarction due to unspecified occlusion or stenosis of left middle cerebral artery: Secondary | ICD-10-CM | POA: Diagnosis not present

## 2022-08-24 NOTE — Progress Notes (Signed)
Patient seen by PA in regards to discharge information. No further questions.  Patient is transferred by tech and secretary in wheelchair & meds are picked up at [facility].

## 2022-08-24 NOTE — Telephone Encounter (Signed)
  Loop Recorder Follow up   Is patient connected to [facility]? Yes   Have steri-strips fallen off or been removed? No   Does the patient need in office follow up? No   Please continue to monitor your cardiac device site for redness, swelling, and drainage. Call the device clinic at [phone] if you experience these symptoms, fever/chills, or have questions about your device.   Remote monitoring is used to monitor your cardiac device from home. This monitoring is scheduled every month by our office. It allows us to keep an eye on the functioning of your device to ensure it is working properly.  

## 2022-08-24 NOTE — Progress Notes (Signed)
PROGRESS NOTE   Subjective/Complaints:  No issues overnite   ROS: Limited due to communication/aphasia Objective:   No results found. Recent Labs    08/22/22 0546  WBC 13.7*  HGB 12.2*  HCT 36.4*  PLT 234    Recent Labs    08/22/22 0546  NA 137  K 4.1  CL 107  CO2 20*  GLUCOSE 122*  BUN 49*  CREATININE 3.22*  CALCIUM 8.8*     Intake/Output Summary (Last 24 hours) at 08/24/2022 0910 Last data filed at 08/24/2022 0753 Gross per 24 hour  Intake 827 ml  Output --  Net 827 ml         Physical Exam: Vital Signs Blood pressure 111/89, pulse 70, temperature 97.9 F (36.6 C), resp. rate 18, height 6' (1.829 m), weight 92.4 kg, SpO2 100 %.   General: No acute distress Mood and affect are appropriate Heart: Regular rate and rhythm no rubs murmurs or extra sounds Lungs: Clear to auscultation, breathing unlabored, no rales or wheezes Abdomen: Positive bowel sounds, soft nontender to palpation, nondistended Extremities: No clubbing, cyanosis, or edema Skin: No evidence of breakdown, no evidence of rash     Motor strength is 5/5 in bilateral deltoid, biceps, triceps, finger flexors and extensors, wrist flexors and extensors, hip flexors, knee flexors and extensors, ankle dorsiflexors, plantar flexors, invertors and evertors, toe flexors and extensors Following simple commands , limited verbal output , occassional single words      Assessment/Plan: 1. Functional deficits left post branch MCA infarct  Stable for D/C today F/u PCP in 3-4 weeks F/u PM&R 1 month F/u neuro 1-2 mo F/u nephro 1-2 mo  See D/C summary See D/C instructions   Care Tool:  Bathing    Body parts bathed by patient: Right arm, Left arm, Chest, Abdomen, Front perineal area, Buttocks, Right upper leg, Left upper leg, Face, Left lower leg, Right lower leg         Bathing assist Assist Level: Independent     Upper Body  Dressing/Undressing Upper body dressing   What is the patient wearing?: Pull over shirt    Upper body assist Assist Level: Independent    Lower Body Dressing/Undressing Lower body dressing      What is the patient wearing?: Pants     Lower body assist Assist for lower body dressing: Independent     Toileting Toileting    Toileting assist Assist for toileting: Independent     Transfers Chair/bed transfer  Transfers assist     Chair/bed transfer assist level: Independent     Locomotion Ambulation   Ambulation assist      Assist level: Independent Assistive device: No Device Max distance: 1,017ft   Walk 10 feet activity   Assist     Assist level: Independent Assistive device: No Device   Walk 50 feet activity   Assist    Assist level: Independent Assistive device: No Device    Walk 150 feet activity   Assist    Assist level: Independent Assistive device: No Device    Walk 10 feet on uneven surface  activity   Assist     Assist level: Independent Assistive device:  Other (comment) (no device)   Wheelchair     Assist Is the patient using a wheelchair?: No             Wheelchair 50 feet with 2 turns activity    Assist            Wheelchair 150 feet activity     Assist          Blood pressure 111/89, pulse 70, temperature 97.9 F (36.6 C), resp. rate 18, height 6' (1.829 m), weight 92.4 kg, SpO2 100 %.  Medical Problem List and Plan: 1. 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 S/p tenecteplase, thrombectomy; has global aphasia             -patient may  shower              -ELOS/Goals:d/c today    Con't CIR PT, OT and SLP-  2.  Antithrombotics: -DVT/anticoagulation:  Pharmaceutical: Heparin-d/c             -antiplatelet therapy: Aspirin and Plavix for three weeks followed by Plavix alone (DAPT  started 4/22)   3. Pain Management: Tylenol as needed   4. Mood/Behavior/Sleep: LCSW to evaluate and provide emotional support             -antipsychotic agents: n/a   5. Neuropsych/cognition: This patient is not  capable of making decisions on his  own behalf.   6. Skin/Wound Care: Routine skin care checks   7. Fluids/Electrolytes/Nutrition: Routine Is and Os and follow-up chemistries   8: Hypertension: monitor TID and prn (Home meds: Amlodipine 10 mg, carvedilol 25 mg, lisinopril 10 mg)             -continue carvedilol 12.5 mg BID             -lisinopril now on allergy list due to #15   4/27- usually controlled, however somewhat elevated this Afternoon at 160s systolic- but was 120s-130s- con't to monitor trend  4/28- Doing better- but DBP somewhat elevated- will con't to monitor trend Vitals:   08/23/22 1953 08/24/22 0515  BP: (!) 141/98 111/89  Pulse: 90 70  Resp: 18 18  Temp: 98 F (36.7 C) 97.9 F (36.6 C)  SpO2: 100% 100%    9: Hyperlipidemia: continue statin   10: Global aphasia: SLP eval   11: OSA: continue CPAP at night   12: DM-2: A1c 6.3%; CBGs better off steroids (no home meds)             -carb modified diet             -? DC SSI   4/27- switched to CBGs BID-AC- will let primary team on Monday assess.  13: CKD stage IV: baseline serum creatinine ~3.5                  Latest Ref Rng & Units 08/19/2022    6:23 AM 08/18/2022    7:12 PM 08/17/2022    7:40 AM  BMP  Glucose 70 - 99 mg/dL 540    981   BUN 6 - 20 mg/dL 50    59   Creatinine 1.91 - 1.24 mg/dL 4.78  2.95  6.21   Sodium 135 - 145 mmol/L 140    137   Potassium 3.5 - 5.1 mmol/L 4.1    4.2   Chloride 98 - 111 mmol/L 110    107  CO2 22 - 32 mmol/L 21    20   Calcium 8.9 - 10.3 mg/dL 8.8    8.3     Stable - f/u with nephro at [facility] 14: PFO: Spencer grade 5 s/p TEE- per Dr Excell Seltzer will f/u in office ~82mo, if no Afib, likely will need PFO closure    15: Angioedema s/p tenecteplase admin>>resolved    16: Anemia; mild: follow-up CBC    17.  Receptive aphasia plus severe speech apraxia  OP speech therapy at [facility]       LOS: 6 days A FACE TO FACE EVALUATION WAS PERFORMED  Erick Colace 08/24/2022, 9:10 AM

## 2022-08-24 NOTE — Discharge Summary (Addendum)
Stroke Discharge Summary  Patient ID: [name]   MRN: [id number]      DOB: 1965/09/08  Date of Admission: 08/14/2022 Date of Discharge: 08/18/2022  Attending Physician:  No att. 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 Active Problems: Expressive and receptive aphasia   Essential hypertension, benign   Hyperlipidemia   DM (diabetes mellitus), type 2   Obesity (BMI 30-39.9)   CAD (coronary artery disease)   OSA (obstructive sleep apnea) PFO Hyperlipidemia    Medications to be continued on Rehab Allergies as of 08/18/2022       Reactions   Amitriptyline Anaphylaxis   Wife reported he had a cardiac arrest in the setting of starting amitriptyline in 2019   Tenecteplase Anaphylaxis   Hives and oral edema necessitating intubation following administration of Tenecteplase   Elavil [amitriptyline Hcl]    Lisinopril Other (See Comments)   Angioedema suspected from TNK d/t timing of reaction; however, adding lisinopril so providers could be informed/cautious with future care (was on lisinopril pta).        Medication List     STOP taking these medications    amLODipine 10 MG tablet Commonly known as: NORVASC   aspirin EC 81 MG tablet Replaced by: aspirin 81 MG chewable tablet   carvedilol 25 MG tablet Commonly known as: COREG   chlorthalidone 25 MG tablet Commonly known as: HYGROTON   febuxostat 40 MG tablet Commonly known as: ULORIC   lisinopril 10 MG tablet Commonly known as: ZESTRIL   VITAMIN D PO       TAKE these medications    acetaminophen 325 MG tablet Commonly known as: TYLENOL Take 2 tablets (650 mg total) by mouth every 4 (four) hours as needed for mild pain (or temp > 37.5 C (99.5 F)). What  changed:  medication strength how much to take when to take this reasons to take this   aspirin 81 MG chewable tablet Chew 1 tablet (81 mg total) by mouth daily. Replaces: aspirin EC 81 MG tablet   clopidogrel 75 MG tablet Commonly known as: PLAVIX Take 1 tablet (75 mg total) by mouth daily.   heparin 5000 UNIT/ML injection Inject 1 mL (5,000 Units total) into the skin every 8 (eight) hours.   insulin aspart 100 UNIT/ML injection Commonly known as: novoLOG Inject 0-9 Units into the skin 4 (four) times daily -  with meals and at bedtime.   senna-docusate 8.6-50 MG tablet Commonly known as: Senokot-S Take 1 tablet by mouth at bedtime as needed for moderate constipation or mild constipation.        LABORATORY STUDIES CBC    Component Value Date/Time   WBC 13.7 (H) 08/22/2022 0546   RBC 4.36 08/22/2022 0546   HGB 12.2 (L) 08/22/2022 0546   HCT 36.4 (L) 08/22/2022 0546   PLT 234 08/22/2022 0546   MCV 83.5 08/22/2022 0546   MCH 28.0 08/22/2022 0546   MCHC 33.5 08/22/2022 0546   RDW 14.1 08/22/2022 0546   LYMPHSABS 1.9 08/19/2022 0623   MONOABS 1.2 (H) 08/19/2022 0623   EOSABS 0.2 08/19/2022 0623   BASOSABS 0.0 08/19/2022 0623   CMP    Component Value Date/Time   NA 137 08/22/2022 0546   K 4.1 08/22/2022 0546   CL  107 08/22/2022 0546   CO2 20 (L) 08/22/2022 0546   GLUCOSE 122 (H) 08/22/2022 0546   BUN 49 (H) 08/22/2022 0546   CREATININE 3.22 (H) 08/22/2022 0546   CALCIUM 8.8 (L) 08/22/2022 0546   PROT 6.5 08/19/2022 0623   ALBUMIN 3.5 08/19/2022 0623   AST 27 08/19/2022 0623   ALT 18 08/19/2022 0623   ALKPHOS 50 08/19/2022 0623   BILITOT 0.8 08/19/2022 0623   GFRNONAA 21 (L) 08/22/2022 0546   GFRAA 19 (L) 04/27/2017 0623   COAGS Lab Results  Component Value Date   INR 1.1 08/14/2022   INR 1.00 04/27/2017   Lipid Panel    Component Value Date/Time   CHOL 172 08/15/2022 0424   TRIG 125 08/16/2022 0647   HDL 36 (L) 08/15/2022 0424   CHOLHDL 4.8  08/15/2022 0424   VLDL 40 08/15/2022 0424   LDLCALC 96 08/15/2022 0424   HgbA1C  Lab Results  Component Value Date   HGBA1C 6.3 (H) 08/15/2022   Urinalysis No results found for: "COLORURINE", "APPEARANCEUR", "LABSPEC", "PHURINE", "GLUCOSEU", "HGBUR", "BILIRUBINUR", "KETONESUR", "PROTEINUR", "UROBILINOGEN", "NITRITE", "LEUKOCYTESUR" Urine Drug Screen     Component Value Date/Time   LABOPIA NONE DETECTED 08/16/2022 0718   COCAINSCRNUR NONE DETECTED 08/16/2022 0718   LABBENZ NONE DETECTED 08/16/2022 0718   AMPHETMU NONE DETECTED 08/16/2022 0718   THCU NONE DETECTED 08/16/2022 0718   LABBARB NONE DETECTED 08/16/2022 0718    Alcohol Level    Component Value Date/Time   ETH <10 08/14/2022 2216     SIGNIFICANT DIAGNOSTIC STUDIES VAS [place] TRANSCRANIAL DOPPLER W BUBBLES  Result Date: 08/19/2022  Transcranial Doppler with Bubble Patient Name:  [name] Paulding  Date of Exam:   08/17/2022 Medical Rec #: [id number]        Accession #:    [id number] Date of Birth: February 05, 1966       Patient Gender: M Patient Age:   52 years Exam Location:  [facility] Procedure:      VAS [place] TRANSCRANIAL DOPPLER W BUBBLES Referring Phys: Hetty Blend --------------------------------------------------------------------------------  Indications: Stroke. Limitations: Unable to insonate transtemporal windows Comparison Study: No prior studies. Performing Technologist: Jean Rosenthal RDMS, RVT  Examination Guidelines: A complete evaluation includes B-mode imaging, spectral Doppler, color Doppler, and power Doppler as needed of all accessible portions of each vessel. Bilateral testing is considered an integral part of a complete examination. Limited examinations for reoccurring indications may be performed as noted.   Summary:  A vascular evaluation was performed. The left vertebral and carotid siphon was studied. An IV was inserted into the patient's right forearm. Verbal informed consent was obtained.  A full curtain of  high intensity transient signals (HITS) was observed with valsalva, indicating a Spencer Grade 5 patent foramen ovale (PFO). Positive TCD Bubble study indicative of a large right to left shunt with valsalva *See table(s) above for TCD measurements and observations.  Diagnosing physician: Delia Heady MD Electronically signed by Delia Heady MD on 08/19/2022 at 3:12:32 PM.    Final    EP PPM/ICD IMPLANT  Result Date: 08/19/2022 SURGEON:  Will Jorja Loa, MD   PREPROCEDURE DIAGNOSIS:  Cryptogenic stroke   POSTPROCEDURE DIAGNOSIS: Cryptogenic stroke    PROCEDURES:  1. Implantable loop recorder implantation   INTRODUCTION:  Rodel Glaspy Armstrong presents with a history of cryptogenic stroke The costs of loop recorder monitoring have been discussed with the patient. Appropriate time out was performed prior to the procedure.   DESCRIPTION OF PROCEDURE:  Informed written consent  was obtained.  The patient required no sedation for the procedure today.  Mapping over the patient's chest was performed to identify the area where electrograms were most prominent for ILR recording.  This area was found to be the left parasternal region over the 4th intercostal space. The patients left chest was therefore prepped and draped in the usual sterile fashion. The skin overlying the left parasternal region was infiltrated with lidocaine for local analgesia.  A 0.5-cm incision was made over the left parasternal region over the 3rd intercostal space.  A subcutaneous ILR pocket was fashioned using a combination of sharp and blunt dissection.  A Medtronic Reveal LINQ (serial # [id number]) implantable loop recorder was then placed into the pocket  R waves were very prominent and measured 0.63mV.  Steri- Strips and a sterile dressing were then applied.  There were no early apparent complications.   CONCLUSIONS:  1. Successful implantation of a implantable loop recorder for a history of cryptogenic stroke  2. No early apparent  complications. Will Jorja Loa, MD 08/18/2022 3:17 PM   ECHO TEE  Result Date: 08/18/2022    TRANSESOPHOGEAL ECHO REPORT   Patient Name:   JABREEL CHIMENTO Muraoka Date of Exam: 08/18/2022 Medical Rec #:  [id number]              Height:       72.0 in Accession #:    [id number]             Weight:       216.0 lb Date of Birth:  January 02, 1966             BSA:          2.202 m Patient Age:    56 years               BP:           154/108 mmHg Patient Gender: M                      HR:           80 bpm. Exam Location:  Inpatient Procedure: 3D Echo, Transesophageal Echo, Cardiac Doppler and Color Doppler Indications:     Stroke  History:         Patient has prior history of Echocardiogram examinations, most                  recent 08/15/2022. Stroke.  Sonographer:     Sheralyn Boatman RDCS Referring Phys:  [id number] SHENG L HALEY Diagnosing Phys: [place] PROCEDURE: After discussion of the risks and benefits of a TEE, an informed consent was obtained from the patient. The transesophogeal probe was passed without difficulty through the esophogus of the patient. Local oropharyngeal anesthetic was provided with Cetacaine. Sedation performed by different physician. The patient was monitored while under deep sedation. Anesthestetic sedation was provided intravenously by Anesthesiology: 303mg  of Propofol, 60mg  of Lidocaine. The patient's vital signs; including heart rate, blood pressure, and oxygen saturation; remained stable throughout the procedure. The patient developed no complications during the procedure.  IMPRESSIONS  1. Left ventricular ejection fraction, by estimation, is 60 to 65%. The left ventricle has normal function.  2. Right ventricular systolic function is normal. The right ventricular size is normal.  3. No left atrial/left atrial appendage thrombus was detected.  4. Mild proplapse of the anterior leaflet. No evidence of mitral valve regurgitation.  5. The aortic valve is normal in structure. Aortic valve  regurgitation is not visualized.  6. Agitated saline contrast bubble study was positive with shunting observed within 3-6 cardiac cycles suggestive of interatrial shunt. There is a small patent foramen ovale. Conclusion(s)/Recommendation(s): Small PFO. PFO area approximately 0.11 cm2. FINDINGS  Left Ventricle: Left ventricular ejection fraction, by estimation, is 60 to 65%. The left ventricle has normal function. The left ventricular internal cavity size was normal in size. Right Ventricle: The right ventricular size is normal. Right ventricular systolic function is normal. Left Atrium: No left atrial/left atrial appendage thrombus was detected. Pericardium: There is no evidence of pericardial effusion. Mitral Valve: Mild proplapse of the anterior leaflet. No evidence of mitral valve regurgitation. Tricuspid Valve: Tricuspid valve regurgitation is not demonstrated. Aortic Valve: The aortic valve is normal in structure. Aortic valve regurgitation is not visualized. Pulmonic Valve: Pulmonic valve regurgitation is not visualized. Aorta: The aortic root and ascending aorta are structurally normal, with no evidence of dilitation. IAS/Shunts: Agitated saline contrast bubble study was positive with shunting observed within 3-6 cardiac cycles suggestive of interatrial shunt. A small patent foramen ovale is detected. Additional Comments: Spectral Doppler performed. Carolan Clines Electronically signed by Carolan Clines Signature Date/Time: 08/18/2022/5:34:20 PM    Final    EP STUDY  Result Date: 08/18/2022 See surgical note for result.  VAS [place] LOWER EXTREMITY VENOUS (DVT)  Result Date: 08/17/2022  Lower Venous DVT Study Patient Name:  BAILY SERPE Lawrie  Date of Exam:   08/17/2022 Medical Rec #: [id number]               Accession #:    [id number] Date of Birth: [birth year]              Patient Gender: M Patient Age:   50 years Exam Location:  [facility] Procedure:      VAS [place] LOWER EXTREMITY VENOUS (DVT) Referring  Phys: Hetty Blend --------------------------------------------------------------------------------  Indications: Stroke. Positive TCD bubble study.  Comparison Study: No prior studies. Performing Technologist: Jean Rosenthal RDMS, RVT  Examination Guidelines: A complete evaluation includes B-mode imaging, spectral Doppler, color Doppler, and power Doppler as needed of all accessible portions of each vessel. Bilateral testing is considered an integral part of a complete examination. Limited examinations for reoccurring indications may be performed as noted. The reflux portion of the exam is performed with the patient in reverse Trendelenburg.  +---------+---------------+---------+-----------+----------+--------------+ RIGHT    CompressibilityPhasicitySpontaneityPropertiesThrombus Aging +---------+---------------+---------+-----------+----------+--------------+ CFV      Full           Yes      Yes                                 +---------+---------------+---------+-----------+----------+--------------+ SFJ      Full                                                        +---------+---------------+---------+-----------+----------+--------------+ FV Prox  Full                                                        +---------+---------------+---------+-----------+----------+--------------+ FV Mid   Full                                                        +---------+---------------+---------+-----------+----------+--------------+  FV DistalFull                                                        +---------+---------------+---------+-----------+----------+--------------+ PFV      Full                                                        +---------+---------------+---------+-----------+----------+--------------+ POP      Full           Yes      Yes                                 +---------+---------------+---------+-----------+----------+--------------+ PTV       Full                                                        +---------+---------------+---------+-----------+----------+--------------+ PERO     Full                                                        +---------+---------------+---------+-----------+----------+--------------+   +---------+---------------+---------+-----------+----------+--------------+ LEFT     CompressibilityPhasicitySpontaneityPropertiesThrombus Aging +---------+---------------+---------+-----------+----------+--------------+ CFV      Full           Yes      Yes                                 +---------+---------------+---------+-----------+----------+--------------+ SFJ      Full                                                        +---------+---------------+---------+-----------+----------+--------------+ FV Prox  Full                                                        +---------+---------------+---------+-----------+----------+--------------+ FV Mid   Full                                                        +---------+---------------+---------+-----------+----------+--------------+ FV DistalFull                                                        +---------+---------------+---------+-----------+----------+--------------+  PFV      Full                                                        +---------+---------------+---------+-----------+----------+--------------+ POP      Full           Yes      Yes                                 +---------+---------------+---------+-----------+----------+--------------+ PTV      Full                                                        +---------+---------------+---------+-----------+----------+--------------+ PERO     Full                                                        +---------+---------------+---------+-----------+----------+--------------+     Summary: RIGHT: - There is no evidence of deep vein  thrombosis in the lower extremity.  - No cystic structure found in the popliteal fossa.  LEFT: - There is no evidence of deep vein thrombosis in the lower extremity.  - No cystic structure found in the popliteal fossa.  *See table(s) above for measurements and observations. Electronically signed by Sherald Hess MD on 08/17/2022 at 4:25:35 PM.    Final    DG Abd Portable 1V  Result Date: 08/15/2022 CLINICAL DATA:  Enteric catheter placement EXAM: PORTABLE ABDOMEN - 1 VIEW COMPARISON:  None Available. FINDINGS: Frontal view of the lower chest and upper abdomen demonstrates enteric catheter passing below diaphragm, tip and side port projecting over the gastric fundus. Visualized bowel gas is unremarkable. Lung bases are clear. IMPRESSION: 1. Enteric catheter tip projecting over the gastric fundus. Electronically Signed   By: Sharlet Salina M.D.   On: 08/15/2022 16:39   MR BRAIN WO CONTRAST  Result Date: 08/15/2022 CLINICAL DATA:  Stroke, hemorrhagic. EXAM: MRI HEAD WITHOUT CONTRAST TECHNIQUE: Multiplanar, multiecho pulse sequences of the brain and surrounding structures were obtained without intravenous contrast. COMPARISON:  CTA head/neck 08/14/2022. FINDINGS: Brain: Acute cortical infarcts along the left frontal operculum and left anterior insula with additional small area of acute cortical infarct within left parietal lobe. Other areas of faint cortical DWI hyperintensity throughout the left MCA territory, including the left putamen, with corresponding decreased signal on ADC may reflect additional areas of infarct or ischemia. Moderate chronic small-vessel disease. No hydrocephalus or extra-axial collection. No foci of abnormal susceptibility. Vascular: Loss of the left transverse sinus flow void is favored to reflect slow flow. Skull and upper cervical spine: Normal marrow signal. Sinuses/Orbits: Unremarkable. Other: None. IMPRESSION: 1. Acute cortical infarcts along the left frontal operculum and left  anterior insula with additional small area of acute cortical infarct within left parietal lobe. Other areas of faint cortical DWI hyperintensity throughout the left MCA territory, including the left putamen, may reflect additional areas of infarct or ischemia. 2. Loss of  the left transverse sinus flow void is favored to reflect slow flow. If clinical suspicion for thrombosis, CT venography could be performed for further characterization. Electronically Signed   By: Orvan Falconer M.D.   On: 08/15/2022 13:50   IR US Guide Vasc Access Right  Result Date: 08/15/2022 INDICATION: 57 year old male presenting [facility] with right-sided weakness and aphasia; NIHSS 20. His last known well was 9 p.m. on 08/14/2022. Past medical history significant for CKD 4, hypertension, type 2 diabetes, hypertension, hyperlipidemia, gout; modified Rankin scale 0. Head CT showed early ischemic changes in the left frontal operculum, insula and temporal lobe (ASPECTS 7) and CT angiogram of the head and neck showed occlusion of the left M1/MCA segment with 111 mL of penumbra. He received intravenous TNK and was then transferred to [facility] for mechanical thrombectomy. At arrival, improvement of his NIHSS was 10. EXAM: ULTRASOUND-GUIDED VASCULAR ACCESS DIAGNOSTIC CEREBRAL ANGIOGRAM COMPARISON:  CT/CT angiogram head neck August 14, 2022. MEDICATIONS: No antibiotic was administered within 1 hour of the procedure. ANESTHESIA/SEDATION: The procedure was performed under general anesthesia. CONTRAST:  25 mL of Omnipaque 300 milligram/mL. FLUOROSCOPY: Radiation Exposure Index (as provided by the fluoroscopic device): 218 mGy Kerma COMPLICATIONS: None immediate. TECHNIQUE: Informed written consent was obtained from the patient's wife after a thorough discussion of the procedural risks, benefits and alternatives. All questions were addressed. Maximal Sterile Barrier Technique was utilized including caps, mask, sterile gowns, sterile gloves, sterile  drape, hand hygiene and skin antiseptic. A timeout was performed prior to the initiation of the procedure. The right groin was prepped and draped in the usual sterile fashion. Using a micropuncture kit and the modified Seldinger technique, access was gained to the right common femoral artery and an 8 French sheath was placed. Real-time ultrasound guidance was utilized for vascular access including the acquisition of a permanent ultrasound image documenting patency of the accessed vessel. Under fluoroscopy, an 8 [place] Walrus balloon guide catheter was navigated over a 6 [place] Berenstein 2 catheter and a 0.035" Terumo Glidewire into the aortic arch. The catheter was placed into the left common carotid artery and then advanced into the left internal carotid artery. The diagnostic catheter was removed. Frontal and lateral angiograms of the head were obtained followed by magnified left anterior oblique and magnified Schuller's view. The catheter was subsequently withdrawn. Right common femoral artery angiogram was obtained in right anterior oblique view. The puncture is at the level of the common femoral artery. The artery has normal caliber, adequate for closure device. The sheath was exchanged over the wire for a Perclose prostyle which was utilized for access closure. Immediate hemostasis was achieved. FINDINGS: Left ICA angiograms: There is brisk vascular contrast filling of the left ICA, left ACA and MCA vascular trees without evidence of occlusion, stenosis or filling defect. Luminal caliber is smooth and tapering. No aneurysms or abnormally high-flow, early draining veins are seen. No regions of abnormal hypervascularity are noted. The visualized dural sinuses are patent. Right common femoral artery angiograms: The access is at the level of the mid right common femoral artery. The femoral artery has normal caliber, adequate for closure device. PROCEDURE: No intervention performed. IMPRESSION: Interval  recanalization of the left M1/MCA occlusion seen on prior CT angiogram likely as a result of TNK administration. Therefore, no intervention performed. PLAN: Transfer patient to ICU under mechanical ventilation due to angioedema. Electronically Signed   By: Baldemar Lenis M.D.   On: 08/15/2022 13:35   IR ANGIO INTRA EXTRACRAN SEL  INTERNAL CAROTID UNI L MOD SED  Result Date: 08/15/2022 INDICATION: 57 year old male presenting [facility] with right-sided weakness and aphasia; NIHSS 20. His last known well was 9 p.m. on 08/14/2022. Past medical history significant for CKD 4, hypertension, type 2 diabetes, hypertension, hyperlipidemia, gout; modified Rankin scale 0. Head CT showed early ischemic changes in the left frontal operculum, insula and temporal lobe (ASPECTS 7) and CT angiogram of the head and neck showed occlusion of the left M1/MCA segment with 111 mL of penumbra. He received intravenous TNK and was then transferred to [facility] for mechanical thrombectomy. At arrival, improvement of his NIHSS was 10. EXAM: ULTRASOUND-GUIDED VASCULAR ACCESS DIAGNOSTIC CEREBRAL ANGIOGRAM COMPARISON:  CT/CT angiogram head neck August 14, 2022. MEDICATIONS: No antibiotic was administered within 1 hour of the procedure. ANESTHESIA/SEDATION: The procedure was performed under general anesthesia. CONTRAST:  25 mL of Omnipaque 300 milligram/mL. FLUOROSCOPY: Radiation Exposure Index (as provided by the fluoroscopic device): 218 mGy Kerma COMPLICATIONS: None immediate. TECHNIQUE: Informed written consent was obtained from the patient's wife after a thorough discussion of the procedural risks, benefits and alternatives. All questions were addressed. Maximal Sterile Barrier Technique was utilized including caps, mask, sterile gowns, sterile gloves, sterile drape, hand hygiene and skin antiseptic. A timeout was performed prior to the initiation of the procedure. The right groin was prepped and draped in the usual sterile  fashion. Using a micropuncture kit and the modified Seldinger technique, access was gained to the right common femoral artery and an 8 French sheath was placed. Real-time ultrasound guidance was utilized for vascular access including the acquisition of a permanent ultrasound image documenting patency of the accessed vessel. Under fluoroscopy, an 8 [place] Walrus balloon guide catheter was navigated over a 6 [place] Berenstein 2 catheter and a 0.035" Terumo Glidewire into the aortic arch. The catheter was placed into the left common carotid artery and then advanced into the left internal carotid artery. The diagnostic catheter was removed. Frontal and lateral angiograms of the head were obtained followed by magnified left anterior oblique and magnified Schuller's view. The catheter was subsequently withdrawn. Right common femoral artery angiogram was obtained in right anterior oblique view. The puncture is at the level of the common femoral artery. The artery has normal caliber, adequate for closure device. The sheath was exchanged over the wire for a Perclose prostyle which was utilized for access closure. Immediate hemostasis was achieved. FINDINGS: Left ICA angiograms: There is brisk vascular contrast filling of the left ICA, left ACA and MCA vascular trees without evidence of occlusion, stenosis or filling defect. Luminal caliber is smooth and tapering. No aneurysms or abnormally high-flow, early draining veins are seen. No regions of abnormal hypervascularity are noted. The visualized dural sinuses are patent. Right common femoral artery angiograms: The access is at the level of the mid right common femoral artery. The femoral artery has normal caliber, adequate for closure device. PROCEDURE: No intervention performed. IMPRESSION: Interval recanalization of the left M1/MCA occlusion seen on prior CT angiogram likely as a result of TNK administration. Therefore, no intervention performed. PLAN: Transfer patient to  ICU under mechanical ventilation due to angioedema. Electronically Signed   By: Baldemar Lenis M.D.   On: 08/15/2022 13:35   ECHOCARDIOGRAM COMPLETE  Result Date: 08/15/2022    ECHOCARDIOGRAM REPORT   Patient Name:   RAYNELL SCOTT Stclair Date of Exam: 08/15/2022 Medical Rec #:  [id number]              Height:  72.0 in Accession #:    [id number]             Weight:       216.0 lb Date of Birth:  03-09-1966             BSA:          2.202 m Patient Age:    56 years               BP:           105/79 mmHg Patient Gender: M                      HR:           79 bpm. Exam Location:  Inpatient Procedure: 2D Echo, Cardiac Doppler and Color Doppler Indications:    Stroke I63.9  History:        Patient has no prior history of Echocardiogram examinations.                 Stroke; Risk Factors:Hypertension.  Sonographer:    Lucendia Herrlich Referring Phys: [id number] ASHISH ARORA IMPRESSIONS  1. Left ventricular ejection fraction, by estimation, is 60 to 65%. The left ventricle has normal function. The left ventricle has no regional wall motion abnormalities. Left ventricular diastolic parameters were normal.  2. Right ventricular systolic function is normal. The right ventricular size is normal.  3. The mitral valve is normal in structure. Trivial mitral valve regurgitation. No evidence of mitral stenosis.  4. The aortic valve is normal in structure. Aortic valve regurgitation is not visualized. No aortic stenosis is present.  5. The inferior vena cava is normal in size with greater than 50% respiratory variability, suggesting right atrial pressure of 3 mmHg.  6. Technically limited study due to poor sound wave transmission. FINDINGS  Left Ventricle: Left ventricular ejection fraction, by estimation, is 60 to 65%. The left ventricle has normal function. The left ventricle has no regional wall motion abnormalities. The left ventricular internal cavity size was normal in size. There is  no left ventricular  hypertrophy. Left ventricular diastolic parameters were normal. Right Ventricle: The right ventricular size is normal. No increase in right ventricular wall thickness. Right ventricular systolic function is normal. Left Atrium: Left atrial size was normal in size. Right Atrium: Right atrial size was normal in size. Pericardium: There is no evidence of pericardial effusion. Mitral Valve: The mitral valve is normal in structure. There is mild calcification of the mitral valve leaflet(s). Trivial mitral valve regurgitation. No evidence of mitral valve stenosis. Tricuspid Valve: The tricuspid valve is normal in structure. Tricuspid valve regurgitation is not demonstrated. No evidence of tricuspid stenosis. Aortic Valve: The aortic valve is normal in structure. Aortic valve regurgitation is not visualized. No aortic stenosis is present. Aortic valve peak gradient measures 3.1 mmHg. Pulmonic Valve: The pulmonic valve was normal in structure. Pulmonic valve regurgitation is not visualized. No evidence of pulmonic stenosis. Aorta: The aortic root is normal in size and structure. Venous: The inferior vena cava is normal in size with greater than 50% respiratory variability, suggesting right atrial pressure of 3 mmHg. IAS/Shunts: No atrial level shunt detected by color flow Doppler.  LEFT VENTRICLE PLAX 2D LVIDd:         4.30 cm Diastology LVIDs:         3.20 cm LV e' medial:    4.29 cm/s LV PW:         0.80 cm  LV E/e' medial:  11.7 LV IVS:        1.00 cm LV e' lateral:   7.36 cm/s                        LV E/e' lateral: 6.8  RIGHT VENTRICLE             IVC RV S prime:     11.10 cm/s  IVC diam: 1.70 cm TAPSE (M-mode): 1.3 cm LEFT ATRIUM           Index       RIGHT ATRIUM          Index LA diam:      2.80 cm 1.27 cm/m  RA Area:     9.72 cm LA Vol (A2C): 16.4 ml 7.45 ml/m  RA Volume:   16.00 ml 7.27 ml/m  AORTIC VALVE AV Vmax:      88.00 cm/s AV Peak Grad: 3.1 mmHg LVOT Vmax:    71.50 cm/s LVOT Vmean:   45.767 cm/s LVOT  VTI:     0.120 m MITRAL VALVE MV Area (PHT): 3.77 cm    SHUNTS MV Decel Time: 201 msec    Systemic VTI: 0.12 m MV E velocity: 50.30 cm/s MV A velocity: 65.10 cm/s MV E/A ratio:  0.77 Arvilla Meres MD Electronically signed by Arvilla Meres MD Signature Date/Time: 08/15/2022/11:36:00 AM    Final    CT ANGIO HEAD NECK W WO CM W PERF (CODE STROKE)  Addendum Date: 08/15/2022   ADDENDUM REPORT: 08/15/2022 00:55 ADDENDUM: CT Brain Perfusion Findings: CBF (<30%) Volume: 43mL Perfusion (Tmax>6.0s) volume: Mismatch Volume: Infarction Location:Left MCA territory Electronically Signed   By: Deatra Robinson M.D.   On: 08/15/2022 00:55   Result Date: 08/15/2022 CLINICAL DATA:  Right-sided weakness EXAM: CT ANGIOGRAPHY HEAD AND NECK TECHNIQUE: Multidetector CT imaging of the head and neck was performed using the standard protocol during bolus administration of intravenous contrast. Multiplanar CT image reconstructions and MIPs were obtained to evaluate the vascular anatomy. Carotid stenosis measurements (when applicable) are obtained utilizing NASCET criteria, using the distal internal carotid diameter as the denominator. RADIATION DOSE REDUCTION: This exam was performed according to the departmental dose-optimization program which includes automated exposure control, adjustment of the mA and/or kV according to patient size and/or use of iterative reconstruction technique. CONTRAST:  OMNIPAQUE IOHEXOL 350 MG/ML SOLN COMPARISON:  None Available. FINDINGS: CTA NECK FINDINGS SKELETON: There is no bony spinal canal stenosis. No lytic or blastic lesion. OTHER NECK: Normal pharynx, larynx and major salivary glands. No cervical lymphadenopathy. Unremarkable thyroid gland. UPPER CHEST: No pneumothorax or pleural effusion. No nodules or masses. AORTIC ARCH: There is no calcific atherosclerosis of the aortic arch. There is no aneurysm, dissection or hemodynamically significant stenosis of the visualized portion  of the aorta. Conventional 3 vessel aortic branching pattern. The visualized proximal subclavian arteries are widely patent. RIGHT CAROTID SYSTEM: Normal without aneurysm, dissection or stenosis. LEFT CAROTID SYSTEM: Normal without aneurysm, dissection or stenosis. VERTEBRAL ARTERIES: Left dominant configuration. Both origins are clearly patent. There is no dissection, occlusion or flow-limiting stenosis to the skull base (V1-V3 segments). CTA HEAD FINDINGS POSTERIOR CIRCULATION: --Vertebral arteries: Normal V4 segments. --Inferior cerebellar arteries: Normal. --Basilar artery: Normal. --Superior cerebellar arteries: Normal. --Posterior cerebral arteries (PCA): Normal. ANTERIOR CIRCULATION: --Intracranial internal carotid arteries: Normal. --Anterior cerebral arteries (ACA): Normal. Both A1 segments are present. Patent anterior communicating artery (a-comm). --Middle cerebral arteries (MCA): Proximal occlusion of  the left MCA M1 segment with poor collateralization throughout the MCA territory. VENOUS SINUSES: As permitted by contrast timing, patent. ANATOMIC VARIANTS: Fetal origins of both posterior cerebral arteries. Review of the MIP images confirms the above findings. IMPRESSION: Proximal occlusion of the left MCA M1 segment with poor collateralization throughout the MCA territory. Critical Value/emergent results were called by telephone at the time of interpretation on 08/14/2022 at 10:47 pm to provider QUALE , who verbally acknowledged these results. Electronically Signed: By: Deatra Robinson M.D. On: 08/14/2022 22:47   CT HEAD CODE STROKE WO CONTRAST  Result Date: 08/14/2022 CLINICAL DATA:  Code stroke.  Right-sided neglect EXAM: CT HEAD WITHOUT CONTRAST TECHNIQUE: Contiguous axial images were obtained from the base of the skull through the vertex without intravenous contrast. RADIATION DOSE REDUCTION: This exam was performed according to the departmental dose-optimization program which includes automated  exposure control, adjustment of the mA and/or kV according to patient size and/or use of iterative reconstruction technique. COMPARISON:  None Available. FINDINGS: Brain: There is no mass, hemorrhage or extra-axial collection. The size and configuration of the ventricles and extra-axial CSF spaces are normal. Decreased gray-white differentiation at the left insular cortex. Vascular: Mild hyperdensity of the left MCA. Skull: The visualized skull base, calvarium and extracranial soft tissues are normal. Sinuses/Orbits: No fluid levels or advanced mucosal thickening of the visualized paranasal sinuses. No mastoid or middle ear effusion. The orbits are normal. ASPECTS [facility] Stroke Program Early CT Score) - Ganglionic level infarction (caudate, lentiform nuclei, internal capsule, insula, M1-M3 cortex): 6 (left insula) - Supraganglionic infarction (M4-M6 cortex): 3 Total score (0-10 with 10 being normal): 9 IMPRESSION: 1. Decreased gray-white differentiation at the left insular cortex, concerning for acute/subacute infarct. ASPECTS is 9. 2. Mild hyperdensity of the left MCA could indicate thrombus. Recommend CTA. 3. No acute hemorrhage These results were called by telephone at the time of interpretation on 08/14/2022 at 10:07 pm to provider MARK QUALE , who verbally acknowledged these results. Electronically Signed   By: Deatra Robinson M.D.   On: 08/14/2022 22:09       HISTORY OF PRESENT ILLNESS Mr. Taylor Levick is a 57 y.o. male with history of CKD 4, on kidney transplant list, hypertension, DM 2, hyperlipidemia BIB EMS 4/21 due to sudden onset of confusion, inability to talk and weakness on the right side.  Was 2100.  TNK was admitted admitted ministered at outside hospital and patient was transferred to [facility] for IR procedure.  On first run of the arteriogram it appeared that the left M1 occlusion had spontaneously recanalized, likely secondary to TNK and no thrombectomy was required.  Patient  remained intubated postprocedure due to angioedema, possibly due to reaction from TNK.    HOSPITAL COURSE Left acute frontal, left anterior insula acute cortical infarcts Small acute cortical small acute left parietal cortical infarct Left M1 Proximal Occlusion with TNK revascularization Etiology: Cryptogenic.   Code Stroke CT head  Decreased gray-white differentiation at the left insular cortex, concerning for acute/subacute infarct. ASPECTS on read is noted as 9, but on review this looks to be more like 7. Mild hyperdensity of the left MCA could indicate thrombus. No acute hemorrhage CTA head & neck/Perfusion Proximal occlusion of the left MCA M1 segment with poor collateralization throughout the MCA territory. Left MCA infarction. Mismatch volume: . MRI: Acute cortical infarcts along the left frontal operculum and left anterior insula with additional small area of acute cortical infarct within left parietal lobe. Other areas of  faint cortical DWI hyperintensity throughout the left MCA territory, including the left putamen, may reflect additional areas of infarct or ischemia.   2D Echo: EF 60 to 65%, trivial MVR. TEE scheduled small PFO.  No other cardiac source of embolism  TCD [place] w/bubbles: Positive for PFO  LE DVT US: Negative Loop recorder to be placed prior to rehab LDL 96 HgbA1c 6.3 VTE prophylaxis - heparin SQ  aspirin 81 mg daily prior to admission, now on aspirin 81mg  and clopidogrel daily.  Recommend aspirin and Plavix for 3 weeks then Plavix alone. (Patient previously on aspirin at home before stroke) Therapy recommendations: AIR Disposition:  pending   Acute Respiratory Failure, post-procedure requiring mechanical ventiliation Angioedema s/p TNK Extubated 4/23 AM Improved swelling Patient on lisinopril at home with no history of angioedema   Hypertension Home meds: Amlodipine 10 mg, carvedilol 25 mg, lisinopril 10 mg Coreg 25mg  reordered. Stable SBP goal  <180, avoid hypotension.  Long-term BP goal normotensive   Hyperlipidemia Home meds:  none LDL 96, goal < 70 Add Lipitor 20mg   Continue statin at discharge   Diabetes type II Home meds:  none HgbA1c 6.3, goal < 7.0 CBGs Recent Labs (last 2 labs)       Recent Labs    08/16/22 1546 08/16/22 2216 08/17/22 0749  GLUCAP 137* 139* 121*       SSI Recommend close follow-up with PCP as patient is close to the DM diagnosis level.    Other Stroke Risk Factors Obesity, BMI: 29.30 BMI >/= 30 associated with increased stroke risk, recommend weight loss, diet and exercise as appropriate  OSA Pending sleep study consent (wife)   Other Active Problems CKD 4 No dialysis currently, on transplant list per wife Follows with [facility]    DISCHARGE EXAM Blood pressure (!) 141/91, pulse 68, temperature 98.1 F (36.7 C), temperature source Oral, resp. rate 18, SpO2 99 %.  PHYSICAL EXAM Patient awake and alert. Global aphasia.Intermittently follows very simple commands, but consistently imitates/mimics. EOMI, tracks examiner. Symmetric facial movement. Spontaneous movement of all extremities with no drift present. No ataxia or tremors seen. FTN intact bilaterally.     Discharge Diet      Diet   Diet Heart Room service appropriate? Yes; Fluid consistency: Thin   liquids  DISCHARGE PLAN Disposition:  Transfer to [facility] for ongoing PT, OT and ST Aspirin 81 mg daily and Plavix 75 mg daily for secondary stroke prevention for 3 weeks then aspirin 81 mg daily alone. Recommend ongoing stroke risk factor control by Primary Care Physician at time of discharge from inpatient rehabilitation. Follow-up PCP Gauger, Hermenia Fiscal, NP in 2 weeks following discharge from rehab. Follow-up in [facility] with Dr. Caffie Damme in 8 weeks following discharge from rehab, office to schedule an appointment.  Outpatient follow-up with Dr. Excell Seltzer  interventional cardiologist for elective PFO closure  50 minutes were spent preparing discharge  Gevena Mart DNP, ACNPC-AG  [facility]  I have personally obtained history,examined this patient, reviewed notes, independently viewed imaging studies, participated in medical decision making and plan of care.ROS completed by me personally and pertinent positives fully documented  I have made any additions or clarifications directly to the above note. Agree with note above.    Delia Heady, MD Medical Director [facility] Pager: [phone] 08/24/2022 5:01 PM

## 2022-08-25 ENCOUNTER — Ambulatory Visit: Payer: BC Managed Care – PPO | Attending: Physician Assistant | Admitting: Speech Pathology

## 2022-08-25 DIAGNOSIS — R482 Apraxia: Secondary | ICD-10-CM | POA: Insufficient documentation

## 2022-08-25 DIAGNOSIS — R4701 Aphasia: Secondary | ICD-10-CM | POA: Insufficient documentation

## 2022-08-25 DIAGNOSIS — I63512 Cerebral infarction due to unspecified occlusion or stenosis of left middle cerebral artery: Secondary | ICD-10-CM | POA: Diagnosis present

## 2022-08-25 NOTE — Therapy (Signed)
OUTPATIENT SPEECH LANGUAGE PATHOLOGY APHASIA EVALUATION   Patient Name: [name] MRN: [id number] DOB:11-Nov-1965, 57 y.o., male Today's Date: 08/25/2022  PCP: Jon Buddy, NP REFERRING PROVIDER: Mariam Dollar, PA   End of Session - 08/25/22 1401     Visit Number 1    Number of Visits 49    Date for SLP Re-Evaluation 11/17/22    Authorization Type [employer]    Progress Note Due on Visit 10    SLP Start Time 1300    SLP Stop Time  1400    SLP Time Calculation (min) 60 min    Activity Tolerance Patient tolerated treatment well             Past Medical History:  Diagnosis Date   Hypertension    LVH (left ventricular hypertrophy)    Myocardial infarction (HCC)    Vitamin D deficiency    Past Surgical History:  Procedure Laterality Date   ANTERIOR CRUCIATE LIGAMENT REPAIR     COLONOSCOPY WITH PROPOFOL N/A 04/11/2018   Procedure: COLONOSCOPY WITH PROPOFOL;  Surgeon: Toledo, Boykin Nearing, MD;  Location: [facility] ENDOSCOPY;  Service: Gastroenterology;  Laterality: N/A;   GASTRIC RESECTION     IR ANGIO INTRA EXTRACRAN SEL INTERNAL CAROTID UNI L MOD SED  08/15/2022   IR US GUIDE VASC ACCESS RIGHT  08/15/2022   LAPAROTOMY     LOOP RECORDER INSERTION N/A 08/18/2022   Procedure: LOOP RECORDER INSERTION;  Surgeon: Jon Lemming, MD;  Location: [facility] INVASIVE CV LAB;  Service: Cardiovascular;  Laterality: N/A;   RADIOLOGY WITH ANESTHESIA N/A 08/14/2022   Procedure: IR WITH ANESTHESIA;  Surgeon: Jon Cotton, MD;  Location: [facility] OR;  Service: Radiology;  Laterality: N/A;   TEE WITHOUT CARDIOVERSION N/A 08/18/2022   Procedure: TRANSESOPHAGEAL ECHOCARDIOGRAM;  Surgeon: Jon Fus, MD;  Location: [facility] INVASIVE CV LAB;  Service: Cardiovascular;  Laterality: N/A;   Patient Active Problem List   Diagnosis Date Noted   Essential hypertension, benign 08/18/2022   Hyperlipidemia 08/18/2022   DM (diabetes mellitus), type 2 (HCC) 08/18/2022   Obesity (BMI  30-39.9) 08/18/2022   CAD (coronary artery disease) 08/18/2022   OSA (obstructive sleep apnea) 08/18/2022   Acute ischemic left MCA stroke (HCC) 08/18/2022   Acute ischemic right MCA stroke (HCC) 08/14/2022   Hypertensive urgency 04/27/2017    ONSET DATE: 08/14/2022; date of referral 08/23/2022   REFERRING DIAG: W09.811 (ICD-10-CM) - Cerebral infarction due to unspecified occlusion or stenosis of left middle cerebral artery  THERAPY DIAG:  Aphasia  Cerebral infarction due to unspecified occlusion or stenosis of left middle cerebral artery (HCC)  Rationale for Evaluation and Treatment Rehabilitation  SUBJECTIVE:   SUBJECTIVE STATEMENT: Pt pleasant, wife provides history Pt accompanied by: significant other  PERTINENT HISTORY:  [name] is 57 year old male who presented to the [facility] ED on 08/14/2022 noted by wife to be very weak and confused and halfway enter out of the bathtub at home. Symptoms were sudden in onset. Last known well 9 PM. Patient presented with global aphasia. Code stroke activated. CT head negative for hemorrhage, aspects of 9, mild hyperdensity left MCA could indicate thrombus. Tenecteplase administered. CTA showed left M1 occlusion and neurointerventional radiology contacted. Patient transferred to [facility] for thrombectomy. On route he was noted to have periorbital welts and uvular swelling. Was concern of possible angioedema secondary to tenecteplase. He was intubated for airway protection and underwent cerebral angiogram by Dr. Tommie Warner. Recanalization of the left MCA vascular  tree noted. Critical care medicine consulted for management and placed on low-dose Neo-Synephrine infusion. Extubated on 4/23. 2D echo with EF of approximately 60 to 65% with trivial MVR. LDL 96, hemoglobin A1c 6.3%. He was started on heparin subcutaneously for VTE prophylaxis.   DIAGNOSTIC FINDINGS:   CT Head - 08/14/2022 Mild hyperdensity of the left MCA  CT Head and  Neck Angio - 08/14/2022 Proximal occlusion of the left MCA M1 segment with poor collateralization throughout the MCA territory.  IR Angio - 08/15/2022 Interval recanalization of the left M1/MCA occlusion seen on prior CT angiogram likely as a result of TNK administration. Therefore, no intervention performed.  MRI 08/15/2022 Acute cortical infarcts along the left frontal operculum and left anterior insula with additional small area of acute cortical infarct within left parietal lobe. Other areas of faint cortical DWI hyperintensity throughout the left MCA territory, including the left putamen, may reflect additional areas of infarct or ischemia. 2. Loss of the left transverse sinus flow void is favored to reflect slow flow. If clinical suspicion for thrombosis, CT venography could be performed for further characterization.   PAIN:  Are you having pain? No  FALLS: Has patient fallen in last 6 months?  No  LIVING ENVIRONMENT: Lives with: lives with their family Lives in: House/apartment  PLOF:  Level of assistance: Independent with ADLs, Independent with IADLs Employment: Full-time employment   PATIENT GOALS    to be able to communicate again   OBJECTIVE:  COGNITION: Overall cognitive status: Difficulty to assess due to: Communication impairment and severity of deficits  AUDITORY COMPREHENSION: Overall auditory comprehension: Impaired: simple YES/NO questions: Impaired: simple Following directions: Impaired: simple Effective technique:  none identified at this time   READING COMPREHENSION: Impaired: word and phrase  EXPRESSION: verbal  VERBAL EXPRESSION: Overall verbal expression: Impaired: simple Level of generative/spontaneous verbalization: word Automatic speech: name: impaired, social response: impaired, and counting: impaired  Repetition: Appears intact Naming: Responsive: 0-25%, Confrontation: 0-25%, Convergent: 0-25%, and Divergent: 0-25% Pragmatics:  Impaired: abnormal effect and eye contact Effective technique:  none identified at this time Non-verbal means of communication: gestures  WRITTEN EXPRESSION: Dominant hand: right  Written expression: Impaired: word  MOTOR SPEECH: Overall motor speech: impaired Level of impairment: Word Respiration: diaphragmatic/abdominal breathing Phonation: normal Resonance: WFL Articulation: Impaired: word Intelligibility: Intelligibility reduced Motor planning: Impaired: unaware, groping for words, and consistent Motor speech errors: unaware, groping for words, and consistent Effective technique:  repetition   ORAL MOTOR EXAMINATION Facial : WFL Lingual: WFL Velum: WFL Mandible: WFL Cough: WFL Voice: WFL    PATIENT REPORTED OUTCOME MEASURES (PROM): To be completed over the next several sessions   TODAY'S TREATMENT:  LINGRAPHICA DEVICE ASSESSMENT TOOL  Physical Assessment:  - Touch Accuracy Largest Size - HIT     Large Size - HIT     Normal Size - HIT     Small Size - HIT     Smallest Size - HIT   - Vision [employer] Lower Right Corner - HIT     Upper Left Corner - HIT     Lower Left Corner- HIT     Upper Right Corner - HIT     Middle - MISS Language Assessment  Phrase Completion - MEDIUM  Categorization - HIGH Repetition - HIGH Confrontational Naming - LOW Conversational Speech - LOW  Device Simulation  Simple Simulation - COMPLETED  Complex Simulation - COMPLETED    PATIENT EDUCATION: Education details: results of this assessment, ST POC Person educated: Patient  and Spouse Education method: Explanation Education comprehension: verbalized understanding and needs further education  HOME EXERCISE PROGRAM:   N/A    GOALS:  Goals reviewed with patient? Yes  SHORT TERM GOALS: Target date: 10 sessions  The patient will name common household objects at 80% accuracy given frequent maximum verbal and frequent maximum phonemic cues. Baseline: Goal status:  INITIAL  2.  The patient will identify the correct word given 2 choices at 80% accuracy given frequent maximal visual cues in order to increase ability to comprehend simple instructions. Baseline:  Goal status: INITIAL  3.  The patient will follow simple body commands presented auditorily at 80% accuracy given frequent maximal visual cues. Baseline:  Goal status: INITIAL  4.  The patient will say functional words (e.g., water, toilet) at 50% accuracy given frequent maximal phonemic placement cues in order to communicate ability to communicate basic wants and needs. Baseline:  Goal status: INITIAL   LONG TERM GOALS: Target date: 11/17/2022  Pt will use multimodal means of communication to express basic biographical information about himself with moderate assistance.  Baseline:  Goal status: INITIAL  2.  Pt will demonstrate > 90% speech intelligibility when reading orientation information.  Baseline:  Goal status: INITIAL   ASSESSMENT:  CLINICAL IMPRESSION: Patient is a 57 y.o. male who was seen today for a speech language evaluation in the setting of recent left MCA CVA. 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   Ongoing assessment of pt's cognitive abilities will continue as his aphasia improves. In the interim, he demonstrates appropriate attention to tasks and some emergent  awareness of errors.   OBJECTIVE IMPAIRMENTS include expressive language, receptive language, apraxia, and reading comprehension and ability to produce written language . These impairments are limiting patient from return to work, managing medications, managing appointments, managing finances, household responsibilities, ADLs/IADLs, and effectively communicating at home and in community. Factors affecting potential to achieve goals and functional outcome are severity of impairments. Patient will benefit from skilled SLP services to address above impairments and improve overall function.  REHAB POTENTIAL: Excellent  PLAN: SLP FREQUENCY: 3x/week to 4xweek  SLP DURATION: 12 weeks  PLANNED INTERVENTIONS: Language facilitation, Cueing hierachy, Functional tasks, Multimodal communication approach, SLP instruction and feedback, Compensatory strategies, and Patient/family education    Elleana Stillson B. Dreama Saa, M.S., CCC-SLP, Tree surgeon Certified Brain Injury Specialist [facility]  [facility] Rehabilitation Services Office [phone] Ascom [phone] Fax [phone]

## 2022-08-26 ENCOUNTER — Ambulatory Visit: Payer: BC Managed Care – PPO | Admitting: Speech Pathology

## 2022-08-26 DIAGNOSIS — R4701 Aphasia: Secondary | ICD-10-CM

## 2022-08-26 DIAGNOSIS — R482 Apraxia: Secondary | ICD-10-CM

## 2022-08-26 DIAGNOSIS — I63512 Cerebral infarction due to unspecified occlusion or stenosis of left middle cerebral artery: Secondary | ICD-10-CM

## 2022-08-29 ENCOUNTER — Ambulatory Visit: Payer: BC Managed Care – PPO | Admitting: Speech Pathology

## 2022-08-29 DIAGNOSIS — R4701 Aphasia: Secondary | ICD-10-CM

## 2022-08-29 DIAGNOSIS — I63512 Cerebral infarction due to unspecified occlusion or stenosis of left middle cerebral artery: Secondary | ICD-10-CM

## 2022-08-29 DIAGNOSIS — R482 Apraxia: Secondary | ICD-10-CM

## 2022-08-29 NOTE — Therapy (Unsigned)
OUTPATIENT SPEECH LANGUAGE PATHOLOGY APHASIA EVALUATION   Patient Name: [name] MRN: [id number] DOB:06-24-1965, 57 y.o., male Today's Date: 08/29/2022  PCP: Myrene Buddy, NP REFERRING PROVIDER: Mariam Dollar, PA   End of Session - 08/29/22 1738     Visit Number 3    Number of Visits 49    Date for SLP Re-Evaluation 11/17/22    Authorization Type [employer]    Progress Note Due on Visit 10    SLP Start Time 1300    SLP Stop Time  1400    SLP Time Calculation (min) 60 min    Activity Tolerance Patient tolerated treatment well             Past Medical History:  Diagnosis Date   Hypertension    LVH (left ventricular hypertrophy)    Myocardial infarction (HCC)    Vitamin D deficiency    Past Surgical History:  Procedure Laterality Date   ANTERIOR CRUCIATE LIGAMENT REPAIR     COLONOSCOPY WITH PROPOFOL N/A 04/11/2018   Procedure: COLONOSCOPY WITH PROPOFOL;  Surgeon: Toledo, Boykin Nearing, MD;  Location: [facility] ENDOSCOPY;  Service: Gastroenterology;  Laterality: N/A;   GASTRIC RESECTION     IR ANGIO INTRA EXTRACRAN SEL INTERNAL CAROTID UNI L MOD SED  08/15/2022   IR US GUIDE VASC ACCESS RIGHT  08/15/2022   LAPAROTOMY     LOOP RECORDER INSERTION N/A 08/18/2022   Procedure: LOOP RECORDER INSERTION;  Surgeon: Regan Lemming, MD;  Location: [facility] INVASIVE CV LAB;  Service: Cardiovascular;  Laterality: N/A;   RADIOLOGY WITH ANESTHESIA N/A 08/14/2022   Procedure: IR WITH ANESTHESIA;  Surgeon: Julieanne Cotton, MD;  Location: [facility] OR;  Service: Radiology;  Laterality: N/A;   TEE WITHOUT CARDIOVERSION N/A 08/18/2022   Procedure: TRANSESOPHAGEAL ECHOCARDIOGRAM;  Surgeon: [name], MD;  Location: [facility] INVASIVE CV LAB;  Service: Cardiovascular;  Laterality: N/A;   Patient Active Problem List   Diagnosis Date Noted   Essential hypertension, benign 08/18/2022   Hyperlipidemia 08/18/2022   DM (diabetes mellitus), type 2 (HCC) 08/18/2022   Obesity (BMI  30-39.9) 08/18/2022   CAD (coronary artery disease) 08/18/2022   OSA (obstructive sleep apnea) 08/18/2022   Acute ischemic left MCA stroke (HCC) 08/18/2022   Acute ischemic right MCA stroke (HCC) 08/14/2022   Hypertensive urgency 04/27/2017    ONSET DATE: 08/14/2022; date of referral 08/23/2022   REFERRING DIAG: W09.811 (ICD-10-CM) - Cerebral infarction due to unspecified occlusion or stenosis of left middle cerebral artery  THERAPY DIAG:  Aphasia  Apraxia  Cerebral infarction due to unspecified occlusion or stenosis of left middle cerebral artery (HCC)  Rationale for Evaluation and Treatment Rehabilitation  SUBJECTIVE:   SUBJECTIVE STATEMENT: Pt pleasant, wife provides history Pt accompanied by: significant other  PERTINENT HISTORY:  Tommie Javens is 57 year old male who presented to the [facility] ED on 08/14/2022 noted by wife to be very weak and confused and halfway enter out of the bathtub at home. Symptoms were sudden in onset. Last known well 9 PM. Patient presented with global aphasia. Code stroke activated. CT head negative for hemorrhage, aspects of 9, mild hyperdensity left MCA could indicate thrombus. Tenecteplase administered. CTA showed left M1 occlusion and neurointerventional radiology contacted. Patient transferred to [facility] for thrombectomy. On route he was noted to have periorbital welts and uvular swelling. Was concern of possible angioedema secondary to tenecteplase. He was intubated for airway protection and underwent cerebral angiogram by Dr. Tommie Sams. Recanalization of the left  MCA vascular tree noted. Critical care medicine consulted for management and placed on low-dose Neo-Synephrine infusion. Extubated on 4/23. 2D echo with EF of approximately 60 to 65% with trivial MVR. LDL 96, hemoglobin A1c 6.3%. He was started on heparin subcutaneously for VTE prophylaxis.   DIAGNOSTIC FINDINGS:   CT Head - 08/14/2022 Mild hyperdensity of the left MCA  CT  Head and Neck Angio - 08/14/2022 Proximal occlusion of the left MCA M1 segment with poor collateralization throughout the MCA territory.  IR Angio - 08/15/2022 Interval recanalization of the left M1/MCA occlusion seen on prior CT angiogram likely as a result of TNK administration. Therefore, no intervention performed.  MRI 08/15/2022 Acute cortical infarcts along the left frontal operculum and left anterior insula with additional small area of acute cortical infarct within left parietal lobe. Other areas of faint cortical DWI hyperintensity throughout the left MCA territory, including the left putamen, may reflect additional areas of infarct or ischemia. 2. Loss of the left transverse sinus flow void is favored to reflect slow flow. If clinical suspicion for thrombosis, CT venography could be performed for further characterization.   PAIN:  Are you having pain? No  FALLS: Has patient fallen in last 6 months?  No  LIVING ENVIRONMENT: Lives with: lives with their family Lives in: House/apartment  PLOF:  Level of assistance: Independent with ADLs, Independent with IADLs Employment: Full-time employment   PATIENT GOALS    to be able to communicate again   OBJECTIVE:  COGNITION: Overall cognitive status: Difficulty to assess due to: Communication impairment and severity of deficits  AUDITORY COMPREHENSION: Overall auditory comprehension: Impaired: simple YES/NO questions: Impaired: simple Following directions: Impaired: simple Effective technique:  none identified at this time   READING COMPREHENSION: Impaired: word and phrase  EXPRESSION: verbal  VERBAL EXPRESSION: Overall verbal expression: Impaired: simple Level of generative/spontaneous verbalization: word Automatic speech: name: impaired, social response: impaired, and counting: impaired  Repetition: Appears intact Naming: Responsive: 0-25%, Confrontation: 0-25%, Convergent: 0-25%, and Divergent:  0-25% Pragmatics: Impaired: abnormal effect and eye contact Effective technique:  none identified at this time Non-verbal means of communication: gestures  WRITTEN EXPRESSION: Dominant hand: right  Written expression: Impaired: word  MOTOR SPEECH: Overall motor speech: impaired Level of impairment: Word Respiration: diaphragmatic/abdominal breathing Phonation: normal Resonance: WFL Articulation: Impaired: word Intelligibility: Intelligibility reduced Motor planning: Impaired: unaware, groping for words, and consistent Motor speech errors: unaware, groping for words, and consistent Effective technique:  repetition   ORAL MOTOR EXAMINATION Facial : WFL Lingual: WFL Velum: WFL Mandible: WFL Cough: WFL Voice: WFL    PATIENT REPORTED OUTCOME MEASURES (PROM): To be completed over the next several sessions   TODAY'S TREATMENT:  LINGRAPHICA DEVICE ASSESSMENT TOOL  Physical Assessment:  - Touch Accuracy Largest Size - HIT     Large Size - HIT     Normal Size - HIT     Small Size - HIT     Smallest Size - HIT   - Vision [employer] Lower Right Corner - HIT     Upper Left Corner - HIT     Lower Left Corner- HIT     Upper Right Corner - HIT     Middle - MISS Language Assessment  Phrase Completion - MEDIUM  Categorization - HIGH Repetition - HIGH Confrontational Naming - LOW Conversational Speech - LOW  Device Simulation  Simple Simulation - COMPLETED  Complex Simulation - COMPLETED    PATIENT EDUCATION: Education details: results of this assessment, ST POC Person  educated: Patient and Spouse Education method: Explanation Education comprehension: verbalized understanding and needs further education  HOME EXERCISE PROGRAM:   N/A    GOALS:  Goals reviewed with patient? Yes  SHORT TERM GOALS: Target date: 10 sessions  The patient will name common household objects at 80% accuracy given frequent maximum verbal and frequent maximum phonemic  cues. Baseline: Goal status: INITIAL  2.  The patient will identify the correct word given 2 choices at 80% accuracy given frequent maximal visual cues in order to increase ability to comprehend simple instructions. Baseline:  Goal status: INITIAL  3.  The patient will follow simple body commands presented auditorily at 80% accuracy given frequent maximal visual cues. Baseline:  Goal status: INITIAL  4.  The patient will say functional words (e.g., water, toilet) at 50% accuracy given frequent maximal phonemic placement cues in order to communicate ability to communicate basic wants and needs. Baseline:  Goal status: INITIAL   LONG TERM GOALS: Target date: 11/17/2022  Pt will use multimodal means of communication to express basic biographical information about himself with moderate assistance.  Baseline:  Goal status: INITIAL  2.  Pt will demonstrate > 90% speech intelligibility when reading orientation information.  Baseline:  Goal status: INITIAL   ASSESSMENT:  CLINICAL IMPRESSION: Patient is a 57 y.o. male who was seen today for a speech language evaluation in the setting of recent left MCA CVA. 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   Ongoing assessment of pt's cognitive abilities will continue as his aphasia improves. In the interim, he demonstrates appropriate attention  to tasks and some emergent awareness of errors.   OBJECTIVE IMPAIRMENTS include expressive language, receptive language, apraxia, and reading comprehension and ability to produce written language . These impairments are limiting patient from return to work, managing medications, managing appointments, managing finances, household responsibilities, ADLs/IADLs, and effectively communicating at home and in community. Factors affecting potential to achieve goals and functional outcome are severity of impairments. Patient will benefit from skilled SLP services to address above impairments and improve overall function.  REHAB POTENTIAL: Excellent  PLAN: SLP FREQUENCY: 3x/week to 4xweek  SLP DURATION: 12 weeks  PLANNED INTERVENTIONS: Language facilitation, Cueing hierachy, Functional tasks, Multimodal communication approach, SLP instruction and feedback, Compensatory strategies, and Patient/family education    Samil Mecham B. Dreama Saa, M.S., CCC-SLP, Tree surgeon Certified Brain Injury Specialist [facility]  [facility] Rehabilitation Services Office [phone] Ascom [phone] Fax [phone]

## 2022-08-29 NOTE — Therapy (Signed)
OUTPATIENT SPEECH LANGUAGE PATHOLOGY  TREATMENT NOTE   Patient Name: [name] MRN: [id number] DOB:Feb 08, 1966, 57 y.o., male Today's Date: 08/29/2022  PCP: Myrene Buddy, NP REFERRING PROVIDER: Mariam Dollar, PA   End of Session - 08/29/22 1225     Visit Number 2    Number of Visits 49    Date for SLP Re-Evaluation 11/17/22    Authorization Type [employer]    Progress Note Due on Visit 10    SLP Start Time 1100    SLP Stop Time  1200    SLP Time Calculation (min) 60 min    Activity Tolerance Patient tolerated treatment well             Past Medical History:  Diagnosis Date   Hypertension    LVH (left ventricular hypertrophy)    Myocardial infarction (HCC)    Vitamin D deficiency    Past Surgical History:  Procedure Laterality Date   ANTERIOR CRUCIATE LIGAMENT REPAIR     COLONOSCOPY WITH PROPOFOL N/A 04/11/2018   Procedure: COLONOSCOPY WITH PROPOFOL;  Surgeon: Toledo, Boykin Nearing, MD;  Location: [facility] ENDOSCOPY;  Service: Gastroenterology;  Laterality: N/A;   GASTRIC RESECTION     IR ANGIO INTRA EXTRACRAN SEL INTERNAL CAROTID UNI L MOD SED  08/15/2022   IR US GUIDE VASC ACCESS RIGHT  08/15/2022   LAPAROTOMY     LOOP RECORDER INSERTION N/A 08/18/2022   Procedure: LOOP RECORDER INSERTION;  Surgeon: Regan Lemming, MD;  Location: [facility] INVASIVE CV LAB;  Service: Cardiovascular;  Laterality: N/A;   RADIOLOGY WITH ANESTHESIA N/A 08/14/2022   Procedure: IR WITH ANESTHESIA;  Surgeon: Julieanne Cotton, MD;  Location: [facility] OR;  Service: Radiology;  Laterality: N/A;   TEE WITHOUT CARDIOVERSION N/A 08/18/2022   Procedure: TRANSESOPHAGEAL ECHOCARDIOGRAM;  Surgeon: [name], MD;  Location: [facility] INVASIVE CV LAB;  Service: Cardiovascular;  Laterality: N/A;   Patient Active Problem List   Diagnosis Date Noted   Essential hypertension, benign 08/18/2022   Hyperlipidemia 08/18/2022   DM (diabetes mellitus), type 2 (HCC) 08/18/2022   Obesity (BMI 30-39.9)  08/18/2022   CAD (coronary artery disease) 08/18/2022   OSA (obstructive sleep apnea) 08/18/2022   Acute ischemic left MCA stroke (HCC) 08/18/2022   Acute ischemic right MCA stroke (HCC) 08/14/2022   Hypertensive urgency 04/27/2017    ONSET DATE: 08/14/2022; date of referral 08/23/2022   REFERRING DIAG: N82.956 (ICD-10-CM) - Cerebral infarction due to unspecified occlusion or stenosis of left middle cerebral artery  THERAPY DIAG:  Aphasia  Apraxia  Cerebral infarction due to unspecified occlusion or stenosis of left middle cerebral artery (HCC)  Rationale for Evaluation and Treatment Rehabilitation  SUBJECTIVE:   SUBJECTIVE STATEMENT: Pt pleasant, wife provides history Pt accompanied by: significant other  PERTINENT HISTORY:  [name] is 57 year old male who presented to the [facility] ED on 08/14/2022 noted by wife to be very weak and confused and halfway enter out of the bathtub at home. Symptoms were sudden in onset. Last known well 9 PM. Patient presented with global aphasia. Code stroke activated. CT head negative for hemorrhage, aspects of 9, mild hyperdensity left MCA could indicate thrombus. Tenecteplase administered. CTA showed left M1 occlusion and neurointerventional radiology contacted. Patient transferred to [facility] for thrombectomy. On route he was noted to have periorbital welts and uvular swelling. Was concern of possible angioedema secondary to tenecteplase. He was intubated for airway protection and underwent cerebral angiogram by Dr. Tommie Sams. Recanalization of the  left MCA vascular tree noted. Critical care medicine consulted for management and placed on low-dose Neo-Synephrine infusion. Extubated on 4/23. 2D echo with EF of approximately 60 to 65% with trivial MVR. LDL 96, hemoglobin A1c 6.3%. He was started on heparin subcutaneously for VTE prophylaxis.   DIAGNOSTIC FINDINGS:   CT Head - 08/14/2022 Mild hyperdensity of the left MCA  CT Head and  Neck Angio - 08/14/2022 Proximal occlusion of the left MCA M1 segment with poor collateralization throughout the MCA territory.  IR Angio - 08/15/2022 Interval recanalization of the left M1/MCA occlusion seen on prior CT angiogram likely as a result of TNK administration. Therefore, no intervention performed.  MRI 08/15/2022 Acute cortical infarcts along the left frontal operculum and left anterior insula with additional small area of acute cortical infarct within left parietal lobe. Other areas of faint cortical DWI hyperintensity throughout the left MCA territory, including the left putamen, may reflect additional areas of infarct or ischemia. 2. Loss of the left transverse sinus flow void is favored to reflect slow flow. If clinical suspicion for thrombosis, CT venography could be performed for further characterization.   PAIN:  Are you having pain? No  FALLS: Has patient fallen in last 6 months?  No  LIVING ENVIRONMENT: Lives with: lives with their family Lives in: House/apartment  PLOF:  Level of assistance: Independent with ADLs, Independent with IADLs Employment: Full-time employment   PATIENT GOALS    to be able to communicate again   OBJECTIVE:  TODAY'S TREATMENT:  Skilled treatment session focused on pt's communication goals. SLP facilitated session by providing the following interventions:  Pt brought in his iPad, SLP created profile on TalkPath Therapy App and downloaded onto pt's iPad.   Receptive Language: Maximal multimodal assistance required for Word Id level 1 to achieve 25% accuracy.  Expressive Language: Pt unable to name common objects but has 100% speech intelligibility imitating SLP model   PATIENT EDUCATION: Education details: results of this assessment, ST POC Person educated: Patient and Spouse Education method: Explanation Education comprehension: verbalized understanding and needs further education  HOME EXERCISE PROGRAM:    N/A    GOALS:  Goals reviewed with patient? Yes  SHORT TERM GOALS: Target date: 10 sessions  The patient will name common household objects at 80% accuracy given frequent maximum verbal and frequent maximum phonemic cues. Baseline: Goal status: INITIAL  2.  The patient will identify the correct word given 2 choices at 80% accuracy given frequent maximal visual cues in order to increase ability to comprehend simple instructions. Baseline:  Goal status: INITIAL  3.  The patient will follow simple body commands presented auditorily at 80% accuracy given frequent maximal visual cues. Baseline:  Goal status: INITIAL  4.  The patient will say functional words (e.g., water, toilet) at 50% accuracy given frequent maximal phonemic placement cues in order to communicate ability to communicate basic wants and needs. Baseline:  Goal status: INITIAL   LONG TERM GOALS: Target date: 11/17/2022  Pt will use multimodal means of communication to express basic biographical information about himself with moderate assistance.  Baseline:  Goal status: INITIAL  2.  Pt will demonstrate > 90% speech intelligibility when reading orientation information.  Baseline:  Goal status: INITIAL   ASSESSMENT:  CLINICAL IMPRESSION: Patient is a 57 y.o. male who was seen today for a speech language evaluation in the setting of recent left MCA CVA. 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   Ongoing assessment of pt's cognitive abilities will continue as his aphasia improves. In the interim, he demonstrates appropriate attention to tasks and some emergent awareness of errors.   OBJECTIVE IMPAIRMENTS include expressive language, receptive language, apraxia, and reading comprehension and ability to produce written language . These impairments are limiting patient from return to work, managing medications, managing appointments, managing finances, household responsibilities, ADLs/IADLs, and effectively communicating at home and in community. Factors affecting potential to achieve goals and functional outcome are severity of impairments. Patient will benefit from skilled SLP services to address above impairments and improve overall function.  REHAB POTENTIAL: Excellent  PLAN: SLP FREQUENCY: 3x/week to 4xweek  SLP DURATION: 12 weeks  PLANNED INTERVENTIONS: Language facilitation, Cueing hierachy, Functional tasks, Multimodal communication approach, SLP instruction and feedback, Compensatory strategies, and Patient/family education    Abdullah Rizzi B. Dreama Saa, M.S., CCC-SLP, Tree surgeon Certified Brain Injury Specialist [facility]  [facility] Rehabilitation Services Office [phone] Ascom [phone] Fax [phone]

## 2022-08-31 ENCOUNTER — Ambulatory Visit: Payer: BC Managed Care – PPO | Admitting: Speech Pathology

## 2022-08-31 DIAGNOSIS — R4701 Aphasia: Secondary | ICD-10-CM | POA: Diagnosis not present

## 2022-08-31 DIAGNOSIS — I63512 Cerebral infarction due to unspecified occlusion or stenosis of left middle cerebral artery: Secondary | ICD-10-CM

## 2022-09-01 ENCOUNTER — Ambulatory Visit: Payer: BC Managed Care – PPO | Admitting: Speech Pathology

## 2022-09-01 DIAGNOSIS — R4701 Aphasia: Secondary | ICD-10-CM

## 2022-09-01 DIAGNOSIS — R482 Apraxia: Secondary | ICD-10-CM

## 2022-09-01 DIAGNOSIS — I63512 Cerebral infarction due to unspecified occlusion or stenosis of left middle cerebral artery: Secondary | ICD-10-CM

## 2022-09-01 NOTE — Therapy (Addendum)
OUTPATIENT SPEECH LANGUAGE PATHOLOGY    Patient Name: [name] MRN: [id number] DOB:[birth year], 57 y.o., male Today's Date: 09/01/2022  PCP: Myrene Buddy, NP REFERRING PROVIDER: Mariam Dollar, PA   End of Session - 09/01/22 1346     Visit Number 5   Number of Visits 49    Date for SLP Re-Evaluation 11/17/22    Authorization Type [employer]    Progress Note Due on Visit 10    SLP Start Time 1400    SLP Stop Time  1500    SLP Time Calculation (min) 60 min    Activity Tolerance Patient tolerated treatment well             Past Medical History:  Diagnosis Date   Hypertension    LVH (left ventricular hypertrophy)    Myocardial infarction (HCC)    Vitamin D deficiency    Past Surgical History:  Procedure Laterality Date   ANTERIOR CRUCIATE LIGAMENT REPAIR     COLONOSCOPY WITH PROPOFOL N/A 04/11/2018   Procedure: COLONOSCOPY WITH PROPOFOL;  Surgeon: Toledo, Boykin Nearing, MD;  Location: [facility] ENDOSCOPY;  Service: Gastroenterology;  Laterality: N/A;   GASTRIC RESECTION     IR ANGIO INTRA EXTRACRAN SEL INTERNAL CAROTID UNI L MOD SED  08/15/2022   IR US GUIDE VASC ACCESS RIGHT  08/15/2022   LAPAROTOMY     LOOP RECORDER INSERTION N/A 08/18/2022   Procedure: LOOP RECORDER INSERTION;  Surgeon: Regan Lemming, MD;  Location: [facility] INVASIVE CV LAB;  Service: Cardiovascular;  Laterality: N/A;   RADIOLOGY WITH ANESTHESIA N/A 08/14/2022   Procedure: IR WITH ANESTHESIA;  Surgeon: Julieanne Cotton, MD;  Location: [facility] OR;  Service: Radiology;  Laterality: N/A;   TEE WITHOUT CARDIOVERSION N/A 08/18/2022   Procedure: TRANSESOPHAGEAL ECHOCARDIOGRAM;  Surgeon: [name], MD;  Location: [facility] INVASIVE CV LAB;  Service: Cardiovascular;  Laterality: N/A;   Patient Active Problem List   Diagnosis Date Noted   Essential hypertension, benign 08/18/2022   Hyperlipidemia 08/18/2022   DM (diabetes mellitus), type 2 (HCC) 08/18/2022   Obesity (BMI 30-39.9) 08/18/2022   CAD  (coronary artery disease) 08/18/2022   OSA (obstructive sleep apnea) 08/18/2022   Acute ischemic left MCA stroke (HCC) 08/18/2022   Acute ischemic right MCA stroke (HCC) 08/14/2022   Hypertensive urgency 04/27/2017   ONSET DATE: 08/14/2022; date of referral 08/23/2022    REFERRING DIAG: N56.213 (ICD-10-CM) - Cerebral infarction due to unspecified occlusion or stenosis of left middle cerebral artery   THERAPY DIAG:  Aphasia   Apraxia   Cerebral infarction due to unspecified occlusion or stenosis of left middle cerebral artery (HCC)   Rationale for Evaluation and Treatment Rehabilitation   SUBJECTIVE:    SUBJECTIVE STATEMENT: Pt pleasant Pt accompanied by: pt's son [name])   PERTINENT HISTORY:  [name] is 57 year old male who presented to the [facility] ED on 08/14/2022 noted by wife to be very weak and confused and halfway enter out of the bathtub at home. Symptoms were sudden in onset. Last known well 9 PM. Patient presented with global aphasia. Code stroke activated. CT head negative for hemorrhage, aspects of 9, mild hyperdensity left MCA could indicate thrombus. Tenecteplase administered. CTA showed left M1 occlusion and neurointerventional radiology contacted. Patient transferred to [facility] for thrombectomy. On route he was noted to have periorbital welts and uvular swelling. Was concern of possible angioedema secondary to tenecteplase. He was intubated for airway protection and underwent cerebral angiogram by Dr. Tommie Sams. Recanalization  of the left MCA vascular tree noted. Critical care medicine consulted for management and placed on low-dose Neo-Synephrine infusion. Extubated on 4/23. 2D echo with EF of approximately 60 to 65% with trivial MVR. LDL 96, hemoglobin A1c 6.3%. He was started on heparin subcutaneously for VTE prophylaxis.     DIAGNOSTIC FINDINGS:    CT Head - 08/14/2022 Mild hyperdensity of the left MCA   CT Head and Neck Angio -  08/14/2022 Proximal occlusion of the left MCA M1 segment with poor collateralization throughout the MCA territory.   IR Angio - 08/15/2022 Interval recanalization of the left M1/MCA occlusion seen on prior CT angiogram likely as a result of TNK administration. Therefore, no intervention performed.   MRI 08/15/2022 Acute cortical infarcts along the left frontal operculum and left anterior insula with additional small area of acute cortical infarct within left parietal lobe. Other areas of faint cortical DWI hyperintensity throughout the left MCA territory, including the left putamen, may reflect additional areas of infarct or ischemia. 2. Loss of the left transverse sinus flow void is favored to reflect slow flow. If clinical suspicion for thrombosis, CT venography could be performed for further characterization.     PAIN:  Are you having pain? No   FALLS: Has patient fallen in last 6 months?  No   LIVING ENVIRONMENT: Lives with: lives with their family Lives in: House/apartment   PLOF:  Level of assistance: Independent with ADLs, Independent with IADLs Employment: Full-time employment     PATIENT GOALS    to be able to communicate again     OBJECTIVE:   TODAY'S TREATMENT:  Skilled treatment session focused on pt's communication goals. SLP facilitated session by providing the following interventions:   Pt brought in his iPad, SLP created profile on TalkPath Therapy App and downloaded onto pt's iPad.    Receptive Language:  - identifying rote information such as numbers, DOW and months of year -   Moderate faded to minimal cues for numbers 1-5; counting out requested number of pennies (1-5) pt able to perform in 3 of 5 opportunities independently Expressive Language:  - maximal multi-modal assistance along with picture of family members to achieve ~ 75% accuracy d/t likely motor speech deficits, receptive deficits - worked towards building emergent awareness of errors    PATIENT EDUCATION: Education details: see above Person educated: Patient and Spouse Education method: Explanation Education comprehension: verbalized understanding and needs further education   HOME EXERCISE PROGRAM:        complete TalkPath Therapy App activities       GOALS:   Goals reviewed with patient? Yes   SHORT TERM GOALS: Target date: 10 sessions   The patient will name common household objects at 80% accuracy given frequent maximum verbal and frequent maximum phonemic cues. Baseline: Goal status: INITIAL   2.  The patient will identify the correct word given 2 choices at 80% accuracy given frequent maximal visual cues in order to increase ability to comprehend simple instructions. Baseline:  Goal status: INITIAL   3.  The patient will follow simple body commands presented auditorily at 80% accuracy given frequent maximal visual cues. Baseline:  Goal status: INITIAL   4.  The patient will say functional words (e.g., water, toilet) at 50% accuracy given frequent maximal phonemic placement cues in order to communicate ability to communicate basic wants and needs. Baseline:  Goal status: INITIAL     LONG TERM GOALS: Target date: 11/17/2022   Pt will use multimodal means of  communication to express basic biographical information about himself with moderate assistance.  Baseline:  Goal status: INITIAL   2.  Pt will demonstrate > 90% speech intelligibility when reading orientation information.  Baseline:  Goal status: INITIAL     ASSESSMENT:   CLINICAL IMPRESSION: Patient is a 57 y.o. male who was seen today for a speech language evaluation in the setting of recent left MCA CVA. 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    Ongoing assessment of pt's cognitive abilities will continue as his aphasia improves. In the interim, he demonstrates appropriate attention to tasks and some emergent awareness of errors.    OBJECTIVE IMPAIRMENTS include expressive language, receptive language, apraxia, and reading comprehension and ability to produce written language . These impairments are limiting patient from return to work, managing medications, managing appointments, managing finances, household responsibilities, ADLs/IADLs, and effectively communicating at home and in community. Factors affecting potential to achieve goals and functional outcome are severity of impairments. Patient will benefit from skilled SLP services to address above impairments and improve overall function.   REHAB POTENTIAL: Excellent   PLAN: SLP FREQUENCY: 3x/week to 4xweek   SLP DURATION: 12 weeks   PLANNED INTERVENTIONS: Language facilitation, Cueing hierachy, Functional tasks, Multimodal communication approach, SLP instruction and feedback, Compensatory strategies, and Patient/family education       Radek Carnero B. Dreama Saa, M.S., CCC-SLP, Tree surgeon Certified Brain Injury Specialist [facility]  [facility] Rehabilitation Services Office [phone] Ascom [phone] Fax [phone]

## 2022-09-01 NOTE — Therapy (Signed)
OUTPATIENT SPEECH LANGUAGE PATHOLOGY    Patient Name: [name] MRN: [id number] DOB:[birth year], 57 y.o., male Today's Date: 09/01/2022  PCP: Myrene Buddy, NP REFERRING PROVIDER: Mariam Dollar, PA   End of Session - 09/01/22 1346     Visit Number 4    Number of Visits 49    Date for SLP Re-Evaluation 11/17/22    Authorization Type [employer]    Progress Note Due on Visit 10    SLP Start Time 1400    SLP Stop Time  1500    SLP Time Calculation (min) 60 min    Activity Tolerance Patient tolerated treatment well             Past Medical History:  Diagnosis Date   Hypertension    LVH (left ventricular hypertrophy)    Myocardial infarction (HCC)    Vitamin D deficiency    Past Surgical History:  Procedure Laterality Date   ANTERIOR CRUCIATE LIGAMENT REPAIR     COLONOSCOPY WITH PROPOFOL N/A 04/11/2018   Procedure: COLONOSCOPY WITH PROPOFOL;  Surgeon: Toledo, Boykin Nearing, MD;  Location: [facility] ENDOSCOPY;  Service: Gastroenterology;  Laterality: N/A;   GASTRIC RESECTION     IR ANGIO INTRA EXTRACRAN SEL INTERNAL CAROTID UNI L MOD SED  08/15/2022   IR US GUIDE VASC ACCESS RIGHT  08/15/2022   LAPAROTOMY     LOOP RECORDER INSERTION N/A 08/18/2022   Procedure: LOOP RECORDER INSERTION;  Surgeon: Regan Lemming, MD;  [facility]: [facility] INVAS[facility] CV LAB;  Service: Cardiovascular;  Laterality: N/A;   RADIOLOGY WITH ANESTHESIA N/A 08/14/2022   Procedure: IR WITH ANESTHESIA;  Surgeon: Julieanne Cotton, MD;  Location: [facility] OR;  Service: Radiology;  Laterality: N/A;   TEE WITHOUT CARDIOVERSION N/A 08/18/2022   Procedure: TRANSESOPHAGEAL ECHOCARDIOGRAM;  Surgeon: [name], MD;  Location: [facility] INVASIVE CV LAB;  Service: Cardiovascular;  Laterality: N/A;   Patient Active Problem List   Diagnosis Date Noted   Essential hypertension, benign 08/18/2022   Hyperlipidemia 08/18/2022   DM (diabetes mellitus), type 2 (HCC) 08/18/2022   Obesity (BMI 30-39.9) 08/18/2022    CAD (coronary artery disease) 08/18/2022   OSA (obstructive sleep apnea) 08/18/2022   Acute ischemic left MCA stroke (HCC) 08/18/2022   Acute ischemic right MCA stroke (HCC) 08/14/2022   Hypertensive urgency 04/27/2017   ONSET DATE: 08/14/2022; date of referral 08/23/2022    REFERRING DIAG: W09.811 (ICD-10-CM) - Cerebral infarction due to unspecified occlusion or stenosis of left middle cerebral artery   THERAPY DIAG:  Aphasia   Apraxia   Cerebral infarction due to unspecified occlusion or stenosis of left middle cerebral artery (HCC)   Rationale for Evaluation and Treatment Rehabilitation   SUBJECTIVE:    SUBJECTIVE STATEMENT: Pt pleasant Pt accompanied by: pt's son [name])   PERTINENT HISTORY:  [name] is 57 year old male who presented to the [facility] ED on 08/14/2022 noted by wife to be very weak and confused and halfway enter out of the bathtub at home. Symptoms were sudden in onset. Last known well 9 PM. Patient presented with global aphasia. Code stroke activated. CT head negative for hemorrhage, aspects of 9, mild hyperdensity left MCA could indicate thrombus. Tenecteplase administered. CTA showed left M1 occlusion and neurointerventional radiology contacted. Patient transferred to [facility] for thrombectomy. On route he was noted to have periorbital welts and uvular swelling. Was concern of possible angioedema secondary to tenecteplase. He was intubated for airway protection and underwent cerebral angiogram by Dr. Tommie Sams.  Recanalization of the left MCA vascular tree noted. Critical care medicine consulted for management and placed on low-dose Neo-Synephrine infusion. Extubated on 4/23. 2D echo with EF of approximately 60 to 65% with trivial MVR. LDL 96, hemoglobin A1c 6.3%. He was started on heparin subcutaneously for VTE prophylaxis.     DIAGNOSTIC FINDINGS:    CT Head - 08/14/2022 Mild hyperdensity of the left MCA   CT Head and Neck Angio -  08/14/2022 Proximal occlusion of the left MCA M1 segment with poor collateralization throughout the MCA territory.   IR Angio - 08/15/2022 Interval recanalization of the left M1/MCA occlusion seen on prior CT angiogram likely as a result of TNK administration. Therefore, no intervention performed.   MRI 08/15/2022 Acute cortical infarcts along the left frontal operculum and left anterior insula with additional small area of acute cortical infarct within left parietal lobe. Other areas of faint cortical DWI hyperintensity throughout the left MCA territory, including the left putamen, may reflect additional areas of infarct or ischemia. 2. Loss of the left transverse sinus flow void is favored to reflect slow flow. If clinical suspicion for thrombosis, CT venography could be performed for further characterization.     PAIN:  Are you having pain? No   FALLS: Has patient fallen in last 6 months?  No   LIVING ENVIRONMENT: Lives with: lives with their family Lives in: House/apartment   PLOF:  Level of assistance: Independent with ADLs, Independent with IADLs Employment: Full-time employment     PATIENT GOALS    to be able to communicate again     OBJECTIVE:   TODAY'S TREATMENT:  Skilled treatment session focused on pt's communication goals. SLP facilitated session by providing the following interventions:   Pt brought in his iPad, SLP created profile on TalkPath Therapy App and downloaded onto pt's iPad.    Receptive Language:  - identifying rote information such as numbers, DOW and months of year -   Maximal faded to moderate provided Expressive Language:  - using rote information, pt able to count numbers 1-10 with > 90% speech intelligibility, able to expressive DOW    PATIENT EDUCATION: Education details: see above Person educated: Patient and Spouse Education method: Explanation Education comprehension: verbalized understanding and needs further education   HOME  EXERCISE PROGRAM:        complete TalkPath Therapy App activities       GOALS:   Goals reviewed with patient? Yes   SHORT TERM GOALS: Target date: 10 sessions   The patient will name common household objects at 80% accuracy given frequent maximum verbal and frequent maximum phonemic cues. Baseline: Goal status: INITIAL   2.  The patient will identify the correct word given 2 choices at 80% accuracy given frequent maximal visual cues in order to increase ability to comprehend simple instructions. Baseline:  Goal status: INITIAL   3.  The patient will follow simple body commands presented auditorily at 80% accuracy given frequent maximal visual cues. Baseline:  Goal status: INITIAL   4.  The patient will say functional words (e.g., water, toilet) at 50% accuracy given frequent maximal phonemic placement cues in order to communicate ability to communicate basic wants and needs. Baseline:  Goal status: INITIAL     LONG TERM GOALS: Target date: 11/17/2022   Pt will use multimodal means of communication to express basic biographical information about himself with moderate assistance.  Baseline:  Goal status: INITIAL   2.  Pt will demonstrate > 90% speech intelligibility when  reading orientation information.  Baseline:  Goal status: INITIAL     ASSESSMENT:   CLINICAL IMPRESSION: Patient is a 57 y.o. male who was seen today for a speech language evaluation in the setting of recent left MCA CVA. 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    Ongoing assessment of pt's cognitive abilities will continue as his aphasia improves. In the interim, he demonstrates appropriate attention to tasks and some emergent awareness of errors.    OBJECTIVE IMPAIRMENTS include expressive language, receptive language, apraxia, and reading comprehension and ability to produce written language . These impairments are limiting patient from return to work, managing medications, managing appointments, managing finances, household responsibilities, ADLs/IADLs, and effectively communicating at home and in community. Factors affecting potential to achieve goals and functional outcome are severity of impairments. Patient will benefit from skilled SLP services to address above impairments and improve overall function.   REHAB POTENTIAL: Excellent   PLAN: SLP FREQUENCY: 3x/week to 4xweek   SLP DURATION: 12 weeks   PLANNED INTERVENTIONS: Language facilitation, Cueing hierachy, Functional tasks, Multimodal communication approach, SLP instruction and feedback, Compensatory strategies, and Patient/family education       [name] B. Dreama Saa, M.S., CCC-SLP, Tree surgeon Certified Brain Injury Specialist [facility]  [facility] Services Office [phone] Ascom [phone] Fax [phone]

## 2022-09-05 ENCOUNTER — Ambulatory Visit: Payer: BC Managed Care – PPO | Admitting: Speech Pathology

## 2022-09-05 DIAGNOSIS — R4701 Aphasia: Secondary | ICD-10-CM | POA: Diagnosis not present

## 2022-09-05 NOTE — Therapy (Signed)
OUTPATIENT SPEECH LANGUAGE PATHOLOGY    Patient Name: [name] MRN: [id number] DOB:1965/07/18, 57 y.o., male Today's Date: 09/01/2022  PCP: Myrene Buddy, NP REFERRING PROVIDER: Mariam Dollar, PA   End of Session - 09/01/22 1346     Visit Number 4    Number of Visits 49    Date for SLP Re-Evaluation 11/17/22    Authorization Type [employer]    Progress Note Due on Visit 10    SLP Start Time 1400    SLP Stop Time  1500    SLP Time Calculation (min) 60 min    Activity Tolerance Patient tolerated treatment well             Past Medical History:  Diagnosis Date   Hypertension    LVH (left ventricular hypertrophy)    Myocardial infarction (HCC)    Vitamin D deficiency    Past Surgical History:  Procedure Laterality Date   ANTERIOR CRUCIATE LIGAMENT REPAIR     COLONOSCOPY WITH PROPOFOL N/A 04/11/2018   Procedure: COLONOSCOPY WITH PROPOFOL;  Surgeon: Toledo, Boykin Nearing, MD;  Location: [facility] ENDOSCOPY;  Service: Gastroenterology;  Laterality: N/A;   GASTRIC RESECTION     IR ANGIO INTRA EXTRACRAN SEL INTERNAL CAROTID UNI L MOD SED  08/15/2022   IR US GUIDE VASC ACCESS RIGHT  08/15/2022   LAPAROTOMY     LOOP RECORDER INSERTION N/A 08/18/2022   Procedure: LOOP RECORDER INSERTION;  Surgeon: Regan Lemming, MD;  [facility]: [facility] INVASIVE CV LAB;  Service: Cardiovascular;  Laterality: N/A;   RADIOLOGY WITH ANESTHESIA N/A 08/14/2022   Procedure: IR WITH ANESTHESIA;  Surgeon: Julieanne Cotton, MD;  Location: [facility] OR;  Service: Radiology;  Laterality: N/A;   TEE WITHOUT CARDIOVERSION N/A 08/18/2022   Procedure: TRANSESOPHAGEAL ECHOCARDIOGRAM;  Surgeon: [name], MD;  Location: [facility] INVASIVE CV LAB;  Service: Cardiovascular;  Laterality: N/A;   Patient Active Problem List   Diagnosis Date Noted   Essential hypertension, benign 08/18/2022   Hyperlipidemia 08/18/2022   DM (diabetes mellitus), type 2 (HCC) 08/18/2022   Obesity (BMI 30-39.9) 08/18/2022    CAD (coronary artery disease) 08/18/2022   OSA (obstructive sleep apnea) 08/18/2022   Acute ischemic left MCA stroke (HCC) 08/18/2022   Acute ischemic right MCA stroke (HCC) 08/14/2022   Hypertensive urgency 04/27/2017   ONSET DATE: 08/14/2022; date of referral 08/23/2022    REFERRING DIAG: W09.811 (ICD-10-CM) - Cerebral infarction due to unspecified occlusion or stenosis of left middle cerebral artery   THERAPY DIAG:  Aphasia   Apraxia   Cerebral infarction due to unspecified occlusion or stenosis of left middle cerebral artery (HCC)   Rationale for Evaluation and Treatment Rehabilitation   SUBJECTIVE:    SUBJECTIVE STATEMENT: Pt pleasant. Son stated pt "talking more" but "gets stuck" on the same words Pt accompanied by: pt's son [name])   PERTINENT HISTORY:  [name] is 57 year old male who presented to the [facility] ED on 08/14/2022 noted by wife to be very weak and confused and halfway enter out of the bathtub at home. Symptoms were sudden in onset. Last known well 9 PM. Patient presented with global aphasia. Code stroke activated. CT head negative for hemorrhage, aspects of 9, mild hyperdensity left MCA could indicate thrombus. Tenecteplase administered. CTA showed left M1 occlusion and neurointerventional radiology contacted. Patient transferred to [facility] for thrombectomy. On route he was noted to have periorbital welts and uvular swelling. Was concern of possible angioedema secondary to tenecteplase. He was  intubated for airway protection and underwent cerebral angiogram by Dr. Tommie Sams. Recanalization of the left MCA vascular tree noted. Critical care medicine consulted for management and placed on low-dose Neo-Synephrine infusion. Extubated on 4/23. 2D echo with EF of approximately 60 to 65% with trivial MVR. LDL 96, hemoglobin A1c 6.3%. He was started on heparin subcutaneously for VTE prophylaxis.     DIAGNOSTIC FINDINGS:    CT Head - 08/14/2022 Mild  hyperdensity of the left MCA   CT Head and Neck Angio - 08/14/2022 Proximal occlusion of the left MCA M1 segment with poor collateralization throughout the MCA territory.   IR Angio - 08/15/2022 Interval recanalization of the left M1/MCA occlusion seen on prior CT angiogram likely as a result of TNK administration. Therefore, no intervention performed.   MRI 08/15/2022 Acute cortical infarcts along the left frontal operculum and left anterior insula with additional small area of acute cortical infarct within left parietal lobe. Other areas of faint cortical DWI hyperintensity throughout the left MCA territory, including the left putamen, may reflect additional areas of infarct or ischemia. 2. Loss of the left transverse sinus flow void is favored to reflect slow flow. If clinical suspicion for thrombosis, CT venography could be performed for further characterization.     PAIN:  Are you having pain? No   FALLS: Has patient fallen in last 6 months?  No   LIVING ENVIRONMENT: Lives with: lives with their family Lives in: House/apartment   PLOF:  Level of assistance: Independent with ADLs, Independent with IADLs Employment: Full-time employment     PATIENT GOALS    to be able to communicate again     OBJECTIVE:   TODAY'S TREATMENT:  Skilled treatment session focused on pt's communication goals. SLP facilitated session by providing the following interventions:    Receptive Language:  - Talkpath App: describe the picture levels 2 & 3 completed with >90% accuracy; Sentence completion completed with ~30% accuracy independently, improving to ~60 with auditory cues Expressive Language:  - maximal multimodal cueing along with picture of family members to achieve ~ 75% accuracy d/t likely motor speech deficits, receptive deficits - worked towards building emergent awareness of errors - Object naming (pictured and functional objects): mod/max verbal hierachical and written cues;  emerging awareness of errors - Answering biographical questions: mod/max verbal hierarchical and written cues; pt benefited from f=3 written choices - Automatic speech tasks: rote counting, DOW, and MOY; 90% accuracy for continuing 1-10; accuracy decreased with word complexity, ~50% for MOY; perseveration noted   PATIENT EDUCATION: Education details: see above Person educated: Patient and son Education method: Explanation Education comprehension: verbalized understanding and needs further education   HOME EXERCISE PROGRAM:        complete TalkPath Therapy App activities       GOALS:   Goals reviewed with patient? Yes   SHORT TERM GOALS: Target date: 10 sessions   The patient will name common household objects at 80% accuracy given frequent maximum verbal and frequent maximum phonemic cues. Baseline: Goal status: INITIAL   2.  The patient will identify the correct word given 2 choices at 80% accuracy given frequent maximal visual cues in order to increase ability to comprehend simple instructions. Baseline:  Goal status: INITIAL   3.  The patient will follow simple body commands presented auditorily at 80% accuracy given frequent maximal visual cues. Baseline:  Goal status: INITIAL   4.  The patient will say functional words (e.g., water, toilet) at 50% accuracy given  frequent maximal phonemic placement cues in order to communicate ability to communicate basic wants and needs. Baseline:  Goal status: INITIAL     LONG TERM GOALS: Target date: 11/17/2022   Pt will use multimodal means of communication to express basic biographical information about himself with moderate assistance.  Baseline:  Goal status: INITIAL   2.  Pt will demonstrate > 90% speech intelligibility when reading orientation information.  Baseline:  Goal status: INITIAL     ASSESSMENT:   CLINICAL IMPRESSION: Patient is a 57 y.o. male who was seen today for a speech language treatment in the setting  of recent left MCA CVA. At this time, presents with what is likely severe global aphasia affecting auditory comprehension, reading comprehension, and verbal expression. Family with subjective report of increased verbal output, but observed perseveration. Pt very motivated to improve speech and return to PLOF. Pt would benefit from continued SLP services to address above mentioned impairments.     OBJECTIVE IMPAIRMENTS include expressive language, receptive language, apraxia, and reading comprehension and ability to produce written language . These impairments are limiting patient from return to work, managing medications, managing appointments, managing finances, household responsibilities, ADLs/IADLs, and effectively communicating at home and in community. Factors affecting potential to achieve goals and functional outcome are severity of impairments. Patient will benefit from skilled SLP services to address above impairments and improve overall function.   REHAB POTENTIAL: Excellent   PLAN: SLP FREQUENCY: 3x/week to 4xweek   SLP DURATION: 12 weeks   PLANNED INTERVENTIONS: Language facilitation, Cueing hierachy, Functional tasks, Multimodal communication approach, SLP instruction and feedback, Compensatory strategies, and Patient/family education       Clyde Canterbury, M.S., CCC-SLP Speech-Language Pathologist [facility] [phone] ([address])   Woodroe Chen, CCC-SLP 09/05/2022, 2:55 PM  [facility] Outpatient Rehabilitation at [facility] [address] [place], [place], [address] Phone: [phone]   Fax:  [phone]

## 2022-09-07 ENCOUNTER — Ambulatory Visit: Payer: BC Managed Care – PPO | Admitting: Speech Pathology

## 2022-09-07 DIAGNOSIS — R4701 Aphasia: Secondary | ICD-10-CM

## 2022-09-07 DIAGNOSIS — I63512 Cerebral infarction due to unspecified occlusion or stenosis of left middle cerebral artery: Secondary | ICD-10-CM

## 2022-09-07 DIAGNOSIS — R482 Apraxia: Secondary | ICD-10-CM

## 2022-09-08 ENCOUNTER — Ambulatory Visit: Payer: BC Managed Care – PPO | Admitting: Speech Pathology

## 2022-09-08 DIAGNOSIS — R4701 Aphasia: Secondary | ICD-10-CM

## 2022-09-08 DIAGNOSIS — R482 Apraxia: Secondary | ICD-10-CM

## 2022-09-08 DIAGNOSIS — I63512 Cerebral infarction due to unspecified occlusion or stenosis of left middle cerebral artery: Secondary | ICD-10-CM

## 2022-09-08 NOTE — Therapy (Signed)
OUTPATIENT SPEECH LANGUAGE PATHOLOGY    Patient Name: [name] MRN: [id number] DOB:03-29-1966, 57 y.o., male Today's Date: 09/08/2022   PCP: Myrene Buddy, NP REFERRING PROVIDER: Mariam Dollar, PA   End of Session - 09/08/22 1603     Visit Number 8    Number of Visits 49    Date for SLP Re-Evaluation 11/17/22    Authorization Type [employer]    Progress Note Due on Visit 10    SLP Start Time 1500    SLP Stop Time  1550    SLP Time Calculation (min) 50 min    Activity Tolerance Patient tolerated treatment well                  Past Medical History:  Diagnosis Date   Hypertension    LVH (left ventricular hypertrophy)    Myocardial infarction (HCC)    Vitamin D deficiency    Past Surgical History:  Procedure Laterality Date   ANTERIOR CRUCIATE LIGAMENT REPAIR     COLONOSCOPY WITH PROPOFOL N/A 04/11/2018   Procedure: COLONOSCOPY WITH PROPOFOL;  Surgeon: Toledo, Boykin Nearing, MD;  Location: [facility] ENDOSCOPY;  Service: Gastroenterology;  Laterality: N/A;   GASTRIC RESECTION     IR ANGIO INTRA EXTRACRAN SEL INTERNAL CAROTID UNI L MOD SED  08/15/2022   IR US GUIDE VASC ACCESS RIGHT  08/15/2022   LAPAROTOMY     LOOP RECORDER INSERTION N/A 08/18/2022   Procedure: LOOP RECORDER INSERTION;  Surgeon: Regan Lemming, MD;  Location: [facility] INVASIVE CV LAB;  Service: Cardiovascular;  Laterality: N/A;   RADIOLOGY WITH ANESTHESIA N/A 08/14/2022   Procedure: IR WITH ANESTHESIA;  Surgeon: Julieanne Cotton, MD;  Location: [facility] OR;  Service: Radiology;  Laterality: N/A;   TEE WITHOUT CARDIOVERSION N/A 08/18/2022   Procedure: TRANSESOPHAGEAL ECHOCARDIOGRAM;  Surgeon: [name], MD;  Location: [facility] INVASIVE CV LAB;  Service: Cardiovascular;  Laterality: N/A;   Patient Active Problem List   Diagnosis Date Noted   Essential hypertension, benign 08/18/2022   Hyperlipidemia 08/18/2022   DM (diabetes mellitus), type 2 (HCC) 08/18/2022   Obesity (BMI 30-39.9)  08/18/2022   CAD (coronary artery disease) 08/18/2022   OSA (obstructive sleep apnea) 08/18/2022   Acute ischemic left MCA stroke (HCC) 08/18/2022   Acute ischemic right MCA stroke (HCC) 08/14/2022   Hypertensive urgency 04/27/2017   ONSET DATE: 08/14/2022; date of referral 08/23/2022    REFERRING DIAG: W09.811 (ICD-10-CM) - Cerebral infarction due to unspecified occlusion or stenosis of left middle cerebral artery   THERAPY DIAG:  Aphasia   Apraxia   Cerebral infarction due to unspecified occlusion or stenosis of left middle cerebral artery (HCC)   Rationale for Evaluation and Treatment Rehabilitation   SUBJECTIVE:    SUBJECTIVE STATEMENT: Pt accompanied by his oldest son, pt appeared more excited today Pt accompanied by: pt's son [name])   PERTINENT HISTORY:  [name] is 57 year old male who presented to the [facility] ED on 08/14/2022 noted by wife to be very weak and confused and halfway enter out of the bathtub at home. Symptoms were sudden in onset. Last known well 9 PM. Patient presented with global aphasia. Code stroke activated. CT head negative for hemorrhage, aspects of 9, mild hyperdensity left MCA could indicate thrombus. Tenecteplase administered. CTA showed left M1 occlusion and neurointerventional radiology contacted. Patient transferred to [facility] for thrombectomy. On route he was noted to have periorbital welts and uvular swelling. Was concern of possible angioedema secondary to  tenecteplase. He was intubated for airway protection and underwent cerebral angiogram by Dr. Tommie Sams. Recanalization of the left MCA vascular tree noted. Critical care medicine consulted for management and placed on low-dose Neo-Synephrine infusion. Extubated on 4/23. 2D echo with EF of approximately 60 to 65% with trivial MVR. LDL 96, hemoglobin A1c 6.3%. He was started on heparin subcutaneously for VTE prophylaxis.     DIAGNOSTIC FINDINGS:    CT Head - 08/14/2022 Mild  hyperdensity of the left MCA   CT Head and Neck Angio - 08/14/2022 Proximal occlusion of the left MCA M1 segment with poor collateralization throughout the MCA territory.   IR Angio - 08/15/2022 Interval recanalization of the left M1/MCA occlusion seen on prior CT angiogram likely as a result of TNK administration. Therefore, no intervention performed.   MRI 08/15/2022 Acute cortical infarcts along the left frontal operculum and left anterior insula with additional small area of acute cortical infarct within left parietal lobe. Other areas of faint cortical DWI hyperintensity throughout the left MCA territory, including the left putamen, may reflect additional areas of infarct or ischemia. 2. Loss of the left transverse sinus flow void is favored to reflect slow flow. If clinical suspicion for thrombosis, CT venography could be performed for further characterization.     PAIN:  Are you having pain? No   FALLS: Has patient fallen in last 6 months?  No   LIVING ENVIRONMENT: Lives with: lives with their family Lives in: House/apartment   PLOF:  Level of assistance: Independent with ADLs, Independent with IADLs Employment: Full-time employment     PATIENT GOALS    to be able to communicate again     OBJECTIVE:   TODAY'S TREATMENT:  Skilled treatment session focused on pt's communication goals. SLP facilitated session by providing the following interventions:    Receptive Language:  - Comprehension of family members' names: from field of 5: 61% - Comprehension of DOW - 100% with rare min A cues - Comprehension of MOY - 7 out of 12 with mod/max hierarchical cues Expressive Language:  - Family Members' names: improved self-correction and response to initial phonemic cue to achieve ~ 85% accuracy  - Automatic speech tasks: rote counting, DOW, and MOY; 90% accuracy for continuing 1-10; accuracy decreased with word complexity, ~90% for MOY; decreased perseveration  observed  Written Language: - pt with attempt to write down information on basketball game - with max to moderate hierarchical cues    PATIENT EDUCATION: Education details: see above Person educated: Patient and son Education method: Explanation Education comprehension: verbalized understanding and needs further education   HOME EXERCISE PROGRAM:        complete TalkPath Therapy App activities       GOALS:   Goals reviewed with patient? Yes   SHORT TERM GOALS: Target date: 10 sessions   The patient will name common household objects at 80% accuracy given frequent maximum verbal and frequent maximum phonemic cues. Baseline: Goal status: INITIAL   2.  The patient will identify the correct word given 2 choices at 80% accuracy given frequent maximal visual cues in order to increase ability to comprehend simple instructions. Baseline:  Goal status: INITIAL   3.  The patient will follow simple body commands presented auditorily at 80% accuracy given frequent maximal visual cues. Baseline:  Goal status: INITIAL   4.  The patient will say functional words (e.g., water, toilet) at 50% accuracy given frequent maximal phonemic placement cues in order to communicate ability to  communicate basic wants and needs. Baseline:  Goal status: INITIAL     LONG TERM GOALS: Target date: 11/17/2022   Pt will use multimodal means of communication to express basic biographical information about himself with moderate assistance.  Baseline:  Goal status: INITIAL   2.  Pt will demonstrate > 90% speech intelligibility when reading orientation information.  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 Family with subjective report of increased verbal output, but observed perseveration. Pt very motivated to improve speech  and return to PLOF. Pt would benefit from continued SLP services to address above mentioned impairments.     OBJECTIVE IMPAIRMENTS include expressive language, receptive language, apraxia, and reading comprehension and ability to produce written language . These impairments are limiting patient from return to work, managing medications, managing appointments, managing finances, household responsibilities, ADLs/IADLs, and effectively communicating at home and in community. Factors affecting potential to achieve goals and functional outcome are severity of impairments. Patient will benefit from skilled SLP services to address above impairments and improve overall function.   REHAB POTENTIAL: Excellent   PLAN: SLP FREQUENCY: 3x/week to 4xweek   SLP DURATION: 12 weeks   PLANNED INTERVENTIONS: Language facilitation, Cueing hierachy, Functional tasks, Multimodal communication approach, SLP instruction and feedback, Compensatory strategies, and Patient/family education       Hyder Deman B. Dreama Saa, M.S., CCC-SLP, Tree surgeon Certified Brain Injury Specialist [facility]  [facility] Rehabilitation Services Office [phone] Ascom [phone] Fax [phone]

## 2022-09-08 NOTE — Therapy (Signed)
OUTPATIENT SPEECH LANGUAGE PATHOLOGY    Patient Name: [name] MRN: [id number] DOB:1965-09-30, 57 y.o., male Today's Date: 09/07/2022  PCP: Myrene Buddy, NP REFERRING PROVIDER: Mariam Dollar, PA   End of Session - 09/07/2022 1325    Visit Number 7   Number of Visits 49    Date for SLP Re-Evaluation 11/17/22    Authorization Type [employer]    Progress Note Due on Visit 10    SLP Start Time 1400    SLP Stop Time  1500    SLP Time Calculation (min) 60 min    Activity Tolerance Patient tolerated treatment well             Past Medical History:  Diagnosis Date   Hypertension    LVH (left ventricular hypertrophy)    Myocardial infarction (HCC)    Vitamin D deficiency    Past Surgical History:  Procedure Laterality Date   ANTERIOR CRUCIATE LIGAMENT REPAIR     COLONOSCOPY WITH PROPOFOL N/A 04/11/2018   Procedure: COLONOSCOPY WITH PROPOFOL;  Surgeon: Toledo, Boykin Nearing, MD;  Location: [facility] ENDOSCOPY;  Service: Gastroenterology;  Laterality: N/A;   GASTRIC RESECTION     IR ANGIO INTRA EXTRACRAN SEL INTERNAL CAROTID UNI L MOD SED  08/15/2022   IR US GUIDE VASC ACCESS RIGHT  08/15/2022   LAPAROTOMY     LOOP RECORDER INSERTION N/A 08/18/2022   Procedure: LOOP RECORDER INSERTION;  Surgeon: Regan Lemming, MD;  Location: [facility] INVASIVE CV LAB;  Service: Cardiovascular;  Laterality: N/A;   RADIOLOGY WITH ANESTHESIA N/A 08/14/2022   Procedure: IR WITH ANESTHESIA;  Surgeon: Julieanne Cotton, MD;  Location: [facility] OR;  Service: Radiology;  Laterality: N/A;   TEE WITHOUT CARDIOVERSION N/A 08/18/2022   Procedure: TRANSESOPHAGEAL ECHOCARDIOGRAM;  Surgeon: [name], MD;  Location: [facility] INVASIVE CV LAB;  Service: Cardiovascular;  Laterality: N/A;   Patient Active Problem List   Diagnosis Date Noted   Essential hypertension, benign 08/18/2022   Hyperlipidemia 08/18/2022   DM (diabetes mellitus), type 2 (HCC) 08/18/2022   Obesity (BMI 30-39.9) 08/18/2022    CAD (coronary artery disease) 08/18/2022   OSA (obstructive sleep apnea) 08/18/2022   Acute ischemic left MCA stroke (HCC) 08/18/2022   Acute ischemic right MCA stroke (HCC) 08/14/2022   Hypertensive urgency 04/27/2017   ONSET DATE: 08/14/2022; date of referral 08/23/2022    REFERRING DIAG: U72.536 (ICD-10-CM) - Cerebral infarction due to unspecified occlusion or stenosis of left middle cerebral artery   THERAPY DIAG:  Aphasia   Apraxia   Cerebral infarction due to unspecified occlusion or stenosis of left middle cerebral artery (HCC)   Rationale for Evaluation and Treatment Rehabilitation   SUBJECTIVE:    SUBJECTIVE STATEMENT: Pt pleasant. Son stated pt "talking more" but "gets stuck" on the same words Pt accompanied by: pt's son [name])   PERTINENT HISTORY:  [name] is 57 year old male who presented to the [facility] ED on 08/14/2022 noted by wife to be very weak and confused and halfway enter out of the bathtub at home. Symptoms were sudden in onset. Last known well 9 PM. Patient presented with global aphasia. Code stroke activated. CT head negative for hemorrhage, aspects of 9, mild hyperdensity left MCA could indicate thrombus. Tenecteplase administered. CTA showed left M1 occlusion and neurointerventional radiology contacted. Patient transferred to [facility] for thrombectomy. On route he was noted to have periorbital welts and uvular swelling. Was concern of possible angioedema secondary to tenecteplase. He was intubated for  airway protection and underwent cerebral angiogram by Dr. Tommie Sams. Recanalization of the left MCA vascular tree noted. Critical care medicine consulted for management and placed on low-dose Neo-Synephrine infusion. Extubated on 4/23. 2D echo with EF of approximately 60 to 65% with trivial MVR. LDL 96, hemoglobin A1c 6.3%. He was started on heparin subcutaneously for VTE prophylaxis.     DIAGNOSTIC FINDINGS:    CT Head - 08/14/2022 Mild  hyperdensity of the left MCA   CT Head and Neck Angio - 08/14/2022 Proximal occlusion of the left MCA M1 segment with poor collateralization throughout the MCA territory.   IR Angio - 08/15/2022 Interval recanalization of the left M1/MCA occlusion seen on prior CT angiogram likely as a result of TNK administration. Therefore, no intervention performed.   MRI 08/15/2022 Acute cortical infarcts along the left frontal operculum and left anterior insula with additional small area of acute cortical infarct within left parietal lobe. Other areas of faint cortical DWI hyperintensity throughout the left MCA territory, including the left putamen, may reflect additional areas of infarct or ischemia. 2. Loss of the left transverse sinus flow void is favored to reflect slow flow. If clinical suspicion for thrombosis, CT venography could be performed for further characterization.     PAIN:  Are you having pain? No   FALLS: Has patient fallen in last 6 months?  No   LIVING ENVIRONMENT: Lives with: lives with their family Lives in: House/apartment   PLOF:  Level of assistance: Independent with ADLs, Independent with IADLs Employment: Full-time employment     PATIENT GOALS    to be able to communicate again     OBJECTIVE:   TODAY'S TREATMENT:  Skilled treatment session focused on pt's communication goals. SLP facilitated session by providing the following interventions:    Receptive Language:  - Comprehension of family members' names: from field of 5: 61% - Comprehension of numbers 1-5: 5 out of 5 - Comprehension of DOW - 4 out of 9 Expressive Language:  - Family Members' names: improved self-correction and response to initial phonemic cue to achieve ~ 75% accuracy  - Object naming (functional objects): improvement noted with carrier phrase and intermittent phonemic cues  - Automatic speech tasks: rote counting, DOW, and MOY; 90% accuracy for continuing 1-10; accuracy decreased  with word complexity, ~75% for MOY; decreased perseveration observed    PATIENT EDUCATION: Education details: see above Person educated: Patient and son Education method: Explanation Education comprehension: verbalized understanding and needs further education   HOME EXERCISE PROGRAM:        complete TalkPath Therapy App activities       GOALS:   Goals reviewed with patient? Yes   SHORT TERM GOALS: Target date: 10 sessions   The patient will name common household objects at 80% accuracy given frequent maximum verbal and frequent maximum phonemic cues. Baseline: Goal status: INITIAL   2.  The patient will identify the correct word given 2 choices at 80% accuracy given frequent maximal visual cues in order to increase ability to comprehend simple instructions. Baseline:  Goal status: INITIAL   3.  The patient will follow simple body commands presented auditorily at 80% accuracy given frequent maximal visual cues. Baseline:  Goal status: INITIAL   4.  The patient will say functional words (e.g., water, toilet) at 50% accuracy given frequent maximal phonemic placement cues in order to communicate ability to communicate basic wants and needs. Baseline:  Goal status: INITIAL     LONG TERM GOALS:  Target date: 11/17/2022   Pt will use multimodal means of communication to express basic biographical information about himself with moderate assistance.  Baseline:  Goal status: INITIAL   2.  Pt will demonstrate > 90% speech intelligibility when reading orientation information.  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 Family with subjective report of increased verbal output, but observed perseveration. Pt very motivated to improve speech and return to PLOF. Pt would benefit from continued SLP services to  address above mentioned impairments.     OBJECTIVE IMPAIRMENTS include expressive language, receptive language, apraxia, and reading comprehension and ability to produce written language . These impairments are limiting patient from return to work, managing medications, managing appointments, managing finances, household responsibilities, ADLs/IADLs, and effectively communicating at home and in community. Factors affecting potential to achieve goals and functional outcome are severity of impairments. Patient will benefit from skilled SLP services to address above impairments and improve overall function.   REHAB POTENTIAL: Excellent   PLAN: SLP FREQUENCY: 3x/week to 4xweek   SLP DURATION: 12 weeks   PLANNED INTERVENTIONS: Language facilitation, Cueing hierachy, Functional tasks, Multimodal communication approach, SLP instruction and feedback, Compensatory strategies, and Patient/family education       Kurtis Anastasia B. Dreama Saa, M.S., CCC-SLP, Tree surgeon Certified Brain Injury Specialist [employer]  [facility] Services Office [phone] Ascom [phone] Fax [phone]

## 2022-09-09 ENCOUNTER — Ambulatory Visit: Payer: BC Managed Care – PPO | Admitting: Speech Pathology

## 2022-09-09 DIAGNOSIS — R4701 Aphasia: Secondary | ICD-10-CM

## 2022-09-09 DIAGNOSIS — R482 Apraxia: Secondary | ICD-10-CM

## 2022-09-09 DIAGNOSIS — I63512 Cerebral infarction due to unspecified occlusion or stenosis of left middle cerebral artery: Secondary | ICD-10-CM

## 2022-09-12 ENCOUNTER — Ambulatory Visit: Payer: BC Managed Care – PPO | Admitting: Speech Pathology

## 2022-09-12 DIAGNOSIS — I63512 Cerebral infarction due to unspecified occlusion or stenosis of left middle cerebral artery: Secondary | ICD-10-CM

## 2022-09-12 DIAGNOSIS — R4701 Aphasia: Secondary | ICD-10-CM

## 2022-09-12 DIAGNOSIS — R482 Apraxia: Secondary | ICD-10-CM

## 2022-09-12 NOTE — Therapy (Signed)
OUTPATIENT SPEECH LANGUAGE PATHOLOGY    Patient Name: [name] MRN: [id number] DOB:[birth year], 57 y.o., male Today's Date: 09/09/2022   PCP: Jon Buddy, NP REFERRING PROVIDER: Mariam Dollar, PA   End of Session - 09/09/22 1244     Visit Number 9    Number of Visits 49    Date for SLP Re-Evaluation 11/17/22    Authorization Type [employer]    Progress Note Due on Visit 10    SLP Start Time 1100    SLP Stop Time  1200    SLP Time Calculation (min) 60 min    Activity Tolerance Patient tolerated treatment well                  Past Medical History:  Diagnosis Date   Hypertension    LVH (left ventricular hypertrophy)    Myocardial infarction (HCC)    Vitamin D deficiency    Past Surgical History:  Procedure Laterality Date   ANTERIOR CRUCIATE LIGAMENT REPAIR     COLONOSCOPY WITH PROPOFOL N/A 04/11/2018   Procedure: COLONOSCOPY WITH PROPOFOL;  Surgeon: Toledo, Boykin Nearing, MD;  Location: [facility] ENDOSCOPY;  Service: Gastroenterology;  Laterality: N/A;   GASTRIC RESECTION     IR ANGIO INTRA EXTRACRAN SEL INTERNAL CAROTID UNI L MOD SED  08/15/2022   IR US GUIDE VASC ACCESS RIGHT  08/15/2022   LAPAROTOMY     LOOP RECORDER INSERTION N/A 08/18/2022   Procedure: LOOP RECORDER INSERTION;  Surgeon: Jon Lemming, MD;  Location: [facility] INVASIVE CV LAB;  Service: Cardiovascular;  Laterality: N/A;   RADIOLOGY WITH ANESTHESIA N/A 08/14/2022   Procedure: IR WITH ANESTHESIA;  Surgeon: Jon Cotton, MD;  Location: [facility] OR;  Service: Radiology;  Laterality: N/A;   TEE WITHOUT CARDIOVERSION N/A 08/18/2022   Procedure: TRANSESOPHAGEAL ECHOCARDIOGRAM;  Surgeon: Jon Fus, MD;  Location: [facility] INVASIVE CV LAB;  Service: Cardiovascular;  Laterality: N/A;   Patient Active Problem List   Diagnosis Date Noted   Essential hypertension, benign 08/18/2022   Hyperlipidemia 08/18/2022   DM (diabetes mellitus), type 2 (HCC) 08/18/2022   Obesity (BMI 30-39.9)  08/18/2022   CAD (coronary artery disease) 08/18/2022   OSA (obstructive sleep apnea) 08/18/2022   Acute ischemic left MCA stroke (HCC) 08/18/2022   Acute ischemic right MCA stroke (HCC) 08/14/2022   Hypertensive urgency 04/27/2017   ONSET DATE: 08/14/2022; date of referral 08/23/2022    REFERRING DIAG: M84.132 (ICD-10-CM) - Cerebral infarction due to unspecified occlusion or stenosis of left middle cerebral artery   THERAPY DIAG:  Aphasia   Apraxia   Cerebral infarction due to unspecified occlusion or stenosis of left middle cerebral artery (HCC)   Rationale for Evaluation and Treatment Rehabilitation   SUBJECTIVE:    SUBJECTIVE STATEMENT: Pt accompanied by his oldest son, pt appeared more excited today Pt accompanied by: pt's son [name])   PERTINENT HISTORY:  [name] is 57 year old male who presented to the [facility] ED on 08/14/2022 noted by wife to be very weak and confused and halfway enter out of the bathtub at home. Symptoms were sudden in onset. Last known well 9 PM. Patient presented with global aphasia. Code stroke activated. CT head negative for hemorrhage, aspects of 9, mild hyperdensity left MCA could indicate thrombus. Tenecteplase administered. CTA showed left M1 occlusion and neurointerventional radiology contacted. Patient transferred to [facility] for thrombectomy. On route he was noted to have periorbital welts and uvular swelling. Was concern of possible angioedema secondary to  tenecteplase. He was intubated for airway protection and underwent cerebral angiogram by Dr. Tommie Warner. Recanalization of the left MCA vascular tree noted. Critical care medicine consulted for management and placed on low-dose Neo-Synephrine infusion. Extubated on 4/23. 2D echo with EF of approximately 60 to 65% with trivial MVR. LDL 96, hemoglobin A1c 6.3%. He was started on heparin subcutaneously for VTE prophylaxis.     DIAGNOSTIC FINDINGS:    CT Head - 08/14/2022 Mild  hyperdensity of the left MCA   CT Head and Neck Angio - 08/14/2022 Proximal occlusion of the left MCA M1 segment with poor collateralization throughout the MCA territory.   IR Angio - 08/15/2022 Interval recanalization of the left M1/MCA occlusion seen on prior CT angiogram likely as a result of TNK administration. Therefore, no intervention performed.   MRI 08/15/2022 Acute cortical infarcts along the left frontal operculum and left anterior insula with additional small area of acute cortical infarct within left parietal lobe. Other areas of faint cortical DWI hyperintensity throughout the left MCA territory, including the left putamen, may reflect additional areas of infarct or ischemia. 2. Loss of the left transverse sinus flow void is favored to reflect slow flow. If clinical suspicion for thrombosis, CT venography could be performed for further characterization.     PAIN:  Are you having pain? No   FALLS: Has patient fallen in last 6 months?  No   LIVING ENVIRONMENT: Lives with: lives with their family Lives in: House/apartment   PLOF:  Level of assistance: Independent with ADLs, Independent with IADLs Employment: Full-time employment     PATIENT GOALS    to be able to communicate again     OBJECTIVE:   TODAY'S TREATMENT:  Skilled treatment session focused on pt's communication goals. SLP facilitated session by providing the following interventions:    Receptive Language:  - Comprehension of family members' names: from field of 5: 61% - Comprehension of DOW - 100% with rare min A cues - Comprehension of MOY - 7 out of 12 with mod/max hierarchical cues Expressive Language:  - Family Members' names: improved self-correction and response to initial phonemic cue to achieve ~ 85% accuracy  - Automatic speech tasks: rote counting, DOW, and MOY; 90% accuracy for continuing 1-10; accuracy decreased with word complexity, ~90% for MOY; decreased perseveration  observed  Written Language: - Pt with improved ability to write family members' names during expressive language task, speech intelligibility improved during written language tasks    PATIENT EDUCATION: Education details: see above Person educated: Patient and son Education method: Explanation Education comprehension: verbalized understanding and needs further education   HOME EXERCISE PROGRAM:        complete TalkPath Therapy App activities       GOALS:   Goals reviewed with patient? Yes   SHORT TERM GOALS: Target date: 10 sessions   The patient will name common household objects at 80% accuracy given frequent maximum verbal and frequent maximum phonemic cues. Baseline: Goal status: INITIAL   2.  The patient will identify the correct word given 2 choices at 80% accuracy given frequent maximal visual cues in order to increase ability to comprehend simple instructions. Baseline:  Goal status: INITIAL   3.  The patient will follow simple body commands presented auditorily at 80% accuracy given frequent maximal visual cues. Baseline:  Goal status: INITIAL   4.  The patient will say functional words (e.g., water, toilet) at 50% accuracy given frequent maximal phonemic placement cues in order to  communicate ability to communicate basic wants and needs. Baseline:  Goal status: INITIAL     LONG TERM GOALS: Target date: 11/17/2022   Pt will use multimodal means of communication to express basic biographical information about himself with moderate assistance.  Baseline:  Goal status: INITIAL   2.  Pt will demonstrate > 90% speech intelligibility when reading orientation information.  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 Family with subjective report of increased verbal output, but observed  perseveration. Pt very motivated to improve speech and return to PLOF. Pt would benefit from continued SLP services to address above mentioned impairments.     OBJECTIVE IMPAIRMENTS include expressive language, receptive language, apraxia, and reading comprehension and ability to produce written language . These impairments are limiting patient from return to work, managing medications, managing appointments, managing finances, household responsibilities, ADLs/IADLs, and effectively communicating at home and in community. Factors affecting potential to achieve goals and functional outcome are severity of impairments. Patient will benefit from skilled SLP services to address above impairments and improve overall function.   REHAB POTENTIAL: Excellent   PLAN: SLP FREQUENCY: 3x/week to 4xweek   SLP DURATION: 12 weeks   PLANNED INTERVENTIONS: Language facilitation, Cueing hierachy, Functional tasks, Multimodal communication approach, SLP instruction and feedback, Compensatory strategies, and Patient/family education       Yvonnie Schinke B. Dreama Saa, M.S., CCC-SLP, Tree surgeon Certified Brain Injury Specialist [facility]  [facility] Rehabilitation Services Office [phone] Ascom [phone] Fax [phone]

## 2022-09-12 NOTE — Therapy (Signed)
OUTPATIENT SPEECH LANGUAGE PATHOLOGY  10th VISIT PROGRESS NOTE   Patient Name: [name] MRN: [id number] DOB:May 04, 1965, 57 y.o., male Today's Date: 09/12/2022  Speech Therapy Progress Note  Dates of Reporting Period: 08/25/2022 to 09/12/2022  Objective: Patient has been seen for 10 speech therapy sessions this reporting period targeting global aphasia and apraxia of speech. Patient is making progress toward LTGs and met 2 STGs this reporting period. See skilled intervention, clinical impressions, and goals below for details.  PCP: Jon Buddy, NP REFERRING PROVIDER: Mariam Dollar, PA   End of Session - 09/12/22 1302     Visit Number 10    Number of Visits 49    Date for SLP Re-Evaluation 11/17/22    Authorization Type [employer]    Progress Note Due on Visit 10    SLP Start Time 1300    SLP Stop Time  1400    SLP Time Calculation (min) 60 min    Activity Tolerance Patient tolerated treatment well                  Past Medical History:  Diagnosis Date   Hypertension    LVH (left ventricular hypertrophy)    Myocardial infarction (HCC)    Vitamin D deficiency    Past Surgical History:  Procedure Laterality Date   ANTERIOR CRUCIATE LIGAMENT REPAIR     COLONOSCOPY WITH PROPOFOL N/A 04/11/2018   Procedure: COLONOSCOPY WITH PROPOFOL;  Surgeon: Toledo, Boykin Nearing, MD;  Location: [facility] ENDOSCOPY;  Service: Gastroenterology;  Laterality: N/A;   GASTRIC RESECTION     IR ANGIO INTRA EXTRACRAN SEL INTERNAL CAROTID UNI L MOD SED  08/15/2022   IR US GUIDE VASC ACCESS RIGHT  08/15/2022   LAPAROTOMY     LOOP RECORDER INSERTION N/A 08/18/2022   Procedure: LOOP RECORDER INSERTION;  Surgeon: Jon Lemming, MD;  Location: [facility] INVASIVE CV LAB;  Service: Cardiovascular;  Laterality: N/A;   RADIOLOGY WITH ANESTHESIA N/A 08/14/2022   Procedure: IR WITH ANESTHESIA;  Surgeon: Jon Cotton, MD;  Location: [facility] OR;  Service: Radiology;  Laterality: N/A;    TEE WITHOUT CARDIOVERSION N/A 08/18/2022   Procedure: TRANSESOPHAGEAL ECHOCARDIOGRAM;  Surgeon: Jon Fus, MD;  Location: [facility] INVASIVE CV LAB;  Service: Cardiovascular;  Laterality: N/A;   Patient Active Problem List   Diagnosis Date Noted   Essential hypertension, benign 08/18/2022   Hyperlipidemia 08/18/2022   DM (diabetes mellitus), type 2 (HCC) 08/18/2022   Obesity (BMI 30-39.9) 08/18/2022   CAD (coronary artery disease) 08/18/2022   OSA (obstructive sleep apnea) 08/18/2022   Acute ischemic left MCA stroke (HCC) 08/18/2022   Acute ischemic right MCA stroke (HCC) 08/14/2022   Hypertensive urgency 04/27/2017   ONSET DATE: 08/14/2022; date of referral 08/23/2022    REFERRING DIAG: N82.956 (ICD-10-CM) - Cerebral infarction due to unspecified occlusion or stenosis of left middle cerebral artery   THERAPY DIAG:  Aphasia   Apraxia   Cerebral infarction due to unspecified occlusion or stenosis of left middle cerebral artery (HCC)   Rationale for Evaluation and Treatment Rehabilitation   SUBJECTIVE:    SUBJECTIVE STATEMENT: Pt accompanied by his oldest son, pt appeared more excited today Pt accompanied by: pt's son [name])   PERTINENT HISTORY:  [name] is 57 year old male who presented to the [facility] ED on 08/14/2022 noted by wife to be very weak and confused and halfway enter out of the bathtub at home. Symptoms were sudden in onset. Last known well 9 PM.  Patient presented with global aphasia. Code stroke activated. CT head negative for hemorrhage, aspects of 9, mild hyperdensity left MCA could indicate thrombus. Tenecteplase administered. CTA showed left M1 occlusion and neurointerventional radiology contacted. Patient transferred to [facility] for thrombectomy. On route he was noted to have periorbital welts and uvular swelling. Was concern of possible angioedema secondary to tenecteplase. He was intubated for airway protection and underwent cerebral angiogram by Dr. Tommie Warner. Recanalization of the left MCA vascular tree noted. Critical care medicine consulted for management and placed on low-dose Neo-Synephrine infusion. Extubated on 4/23. 2D echo with EF of approximately 60 to 65% with trivial MVR. LDL 96, hemoglobin A1c 6.3%. He was started on heparin subcutaneously for VTE prophylaxis.     DIAGNOSTIC FINDINGS:    CT Head - 08/14/2022 Mild hyperdensity of the left MCA   CT Head and Neck Angio - 08/14/2022 Proximal occlusion of the left MCA M1 segment with poor collateralization throughout the MCA territory.   IR Angio - 08/15/2022 Interval recanalization of the left M1/MCA occlusion seen on prior CT angiogram likely as a result of TNK administration. Therefore, no intervention performed.   MRI 08/15/2022 Acute cortical infarcts along the left frontal operculum and left anterior insula with additional small area of acute cortical infarct within left parietal lobe. Other areas of faint cortical DWI hyperintensity throughout the left MCA territory, including the left putamen, may reflect additional areas of infarct or ischemia. 2. Loss of the left transverse sinus flow void is favored to reflect slow flow. If clinical suspicion for thrombosis, CT venography could be performed for further characterization.     PAIN:  Are you having pain? No   FALLS: Has patient fallen in last 6 months?  No   LIVING ENVIRONMENT: Lives with: lives with their family Lives in: House/apartment   PLOF:  Level of assistance: Independent with ADLs, Independent with IADLs Employment: Full-time employment     PATIENT GOALS    to be able to communicate again     OBJECTIVE:   TODAY'S TREATMENT:  Skilled treatment session focused on pt's communication goals. SLP facilitated session by providing the following interventions:    Receptive Language:  - Comprehension of family members' names: from field of 5: 100% - Comprehension of DOW - 100% with rare  min A cues - Comprehension of MOY - 7 out of 12 with mod/max hierarchical cues Expressive Language:  - Family Members' names: improved self-correction and response to initial phonemic cue to achieve ~ 80% accuracy  - Automatic speech tasks: rote counting, DOW, and MOY; 90% accuracy for continuing 1-10; accuracy decreased with word complexity, ~90% for MOY; decreased perseveration observed  Written Language: - pt with attempt to write down information on basketball game - with moderate hierarchical cues    PATIENT EDUCATION: Education details: see above Person educated: Patient and son Education method: Explanation Education comprehension: verbalized understanding and needs further education   HOME EXERCISE PROGRAM:        complete TalkPath Therapy App activities       GOALS:   Goals reviewed with patient? Yes   SHORT TERM GOALS: Target date: 10 sessions   UPDATED GOALS: 09/12/2022 The patient will name common household objects at 80% accuracy given frequent maximum verbal and frequent maximum phonemic cues. Goal status: INITIAL Goal Status: MET for names of family members Goal Updated: With moderate assistance, pt will name common household objects at 80% accuracy.    2.  The patient will  identify the correct word given 2 choices at 80% accuracy given frequent maximal visual cues in order to increase ability to comprehend simple instructions. Goal status: INITIAL Goal status: Ongoing   3.  The patient will follow simple body commands presented auditorily at 80% accuracy given frequent maximal visual cues. Goal status: INITIAL Goal status: Ongoing   4.  The patient will say functional words (e.g., water, toilet) at 50% accuracy given frequent maximal phonemic placement cues in order to communicate ability to communicate basic wants and needs. Goal status: INITIAL Goal status: MET Goal updated: The patient will say functional words (e.g., water, toilet) at 75% accuracy given  frequent maximal phonemic placement cues in order to communicate ability to communicate basic wants and needs.     LONG TERM GOALS: Target date: 11/17/2022   Updated: 09/12/2022 Pt will use multimodal means of communication to express basic biographical information about himself with moderate assistance.  Baseline:  Goal status: INITIAL Goal status: ONGOING    2.  Pt will demonstrate > 90% speech intelligibility when reading orientation information.  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 Family with subjective report of increased verbal output, but observed perseveration. Pt very motivated to improve speech and return to PLOF. Pt would benefit from continued SLP services to address above mentioned impairments.     OBJECTIVE IMPAIRMENTS include expressive language, receptive language, apraxia, and reading comprehension and ability to produce written language . These impairments are limiting patient from return to work, managing medications, managing appointments, managing finances, household responsibilities, ADLs/IADLs, and effectively communicating at home and in community. Factors affecting potential to achieve goals and functional outcome are severity of impairments. Patient will benefit from skilled SLP services to address above impairments and improve overall function.   REHAB POTENTIAL: Excellent   PLAN: SLP FREQUENCY: 3x/week to 4xweek   SLP DURATION: 12 weeks   PLANNED INTERVENTIONS: Language facilitation, Cueing hierachy, Functional tasks, Multimodal communication approach, SLP instruction and feedback, Compensatory strategies, and Patient/family education       Quinnlyn Hearns B. Dreama Saa, M.S., CCC-SLP, Tree surgeon Certified Brain Injury Specialist [facility]  [facility] Rehabilitation Services Office [phone] Ascom [phone] Fax [phone]

## 2022-09-14 ENCOUNTER — Ambulatory Visit: Payer: BC Managed Care – PPO | Admitting: Speech Pathology

## 2022-09-14 ENCOUNTER — Telehealth: Payer: Self-pay

## 2022-09-14 DIAGNOSIS — R4701 Aphasia: Secondary | ICD-10-CM

## 2022-09-14 DIAGNOSIS — R482 Apraxia: Secondary | ICD-10-CM

## 2022-09-14 DIAGNOSIS — I63512 Cerebral infarction due to unspecified occlusion or stenosis of left middle cerebral artery: Secondary | ICD-10-CM

## 2022-09-14 NOTE — Telephone Encounter (Signed)
Per Dr. Pearlean Brownie, scheduled the patient for PFO consult with Dr. Excell Seltzer 11/03/2022. His wife was grateful for call and agreed with plan.

## 2022-09-15 ENCOUNTER — Ambulatory Visit: Payer: BC Managed Care – PPO | Admitting: Speech Pathology

## 2022-09-15 DIAGNOSIS — R4701 Aphasia: Secondary | ICD-10-CM | POA: Diagnosis not present

## 2022-09-15 DIAGNOSIS — I63512 Cerebral infarction due to unspecified occlusion or stenosis of left middle cerebral artery: Secondary | ICD-10-CM

## 2022-09-15 DIAGNOSIS — R482 Apraxia: Secondary | ICD-10-CM

## 2022-09-15 NOTE — Therapy (Addendum)
OUTPATIENT SPEECH LANGUAGE PATHOLOGY     Patient Name: [name] MRN: [id number] DOB:Dec 02, 1965, 57 y.o., male Today's Date: 09/14/2022   PCP: Myrene Buddy, NP REFERRING PROVIDER: Mariam Dollar, PA   End of Session - 09/14/22 1217     Visit Number 11    Number of Visits 49    Date for SLP Re-Evaluation 11/17/22    Authorization Type [employer]    Progress Note Due on Visit 10    SLP Start Time 1300    SLP Stop Time  1400    SLP Time Calculation (min) 60 min    Activity Tolerance Patient tolerated treatment well             Past Medical History:  Diagnosis Date   Hypertension    LVH (left ventricular hypertrophy)    Myocardial infarction (HCC)    Vitamin D deficiency    Past Surgical History:  Procedure Laterality Date   ANTERIOR CRUCIATE LIGAMENT REPAIR     COLONOSCOPY WITH PROPOFOL N/A 04/11/2018   Procedure: COLONOSCOPY WITH PROPOFOL;  Surgeon: Toledo, Boykin Nearing, MD;  Location: [facility] ENDOSCOPY;  Service: Gastroenterology;  Laterality: N/A;   GASTRIC RESECTION     IR ANGIO INTRA EXTRACRAN SEL INTERNAL CAROTID UNI L MOD SED  08/15/2022   IR US GUIDE VASC ACCESS RIGHT  08/15/2022   LAPAROTOMY     LOOP RECORDER INSERTION N/A 08/18/2022   Procedure: LOOP RECORDER INSERTION;  Surgeon: Regan Lemming, MD;  Location: [facility] INVASIVE CV LAB;  Service: Cardiovascular;  Laterality: N/A;   RADIOLOGY WITH ANESTHESIA N/A 08/14/2022   Procedure: IR WITH ANESTHESIA;  Surgeon: Julieanne Cotton, MD;  Location: [facility] OR;  Service: Radiology;  Laterality: N/A;   TEE WITHOUT CARDIOVERSION N/A 08/18/2022   Procedure: TRANSESOPHAGEAL ECHOCARDIOGRAM;  Surgeon: [name], MD;  Location: [facility] INVASIVE CV LAB;  Service: Cardiovascular;  Laterality: N/A;   Patient Active Problem List   Diagnosis Date Noted   Essential hypertension, benign 08/18/2022   Hyperlipidemia 08/18/2022   DM (diabetes mellitus), type 2 (HCC) 08/18/2022   Obesity (BMI 30-39.9)  08/18/2022   CAD (coronary artery disease) 08/18/2022   OSA (obstructive sleep apnea) 08/18/2022   Acute ischemic left MCA stroke (HCC) 08/18/2022   Acute ischemic right MCA stroke (HCC) 08/14/2022   Hypertensive urgency 04/27/2017   ONSET DATE: 08/14/2022; date of referral 08/23/2022    REFERRING DIAG: W09.811 (ICD-10-CM) - Cerebral infarction due to unspecified occlusion or stenosis of left middle cerebral artery   THERAPY DIAG:  Aphasia   Apraxia   Cerebral infarction due to unspecified occlusion or stenosis of left middle cerebral artery (HCC)   Rationale for Evaluation and Treatment Rehabilitation   SUBJECTIVE:    SUBJECTIVE STATEMENT: Pt continues to be eager Pt accompanied by: pt's son [name])   PERTINENT HISTORY:  [name] is 57 year old male who presented to the [facility] ED on 08/14/2022 noted by wife to be very weak and confused and halfway enter out of the bathtub at home. Symptoms were sudden in onset. Last known well 9 PM. Patient presented with global aphasia. Code stroke activated. CT head negative for hemorrhage, aspects of 9, mild hyperdensity left MCA could indicate thrombus. Tenecteplase administered. CTA showed left M1 occlusion and neurointerventional radiology contacted. Patient transferred to [facility] for thrombectomy. On route he was noted to have periorbital welts and uvular swelling. Was concern of possible angioedema secondary to tenecteplase. He was intubated for airway protection and underwent cerebral  angiogram by Dr. Tommie Sams. Recanalization of the left MCA vascular tree noted. Critical care medicine consulted for management and placed on low-dose Neo-Synephrine infusion. Extubated on 4/23. 2D echo with EF of approximately 60 to 65% with trivial MVR. LDL 96, hemoglobin A1c 6.3%. He was started on heparin subcutaneously for VTE prophylaxis.     DIAGNOSTIC FINDINGS:    CT Head - 08/14/2022 Mild hyperdensity of the left MCA   CT Head and  Neck Angio - 08/14/2022 Proximal occlusion of the left MCA M1 segment with poor collateralization throughout the MCA territory.   IR Angio - 08/15/2022 Interval recanalization of the left M1/MCA occlusion seen on prior CT angiogram likely as a result of TNK administration. Therefore, no intervention performed.   MRI 08/15/2022 Acute cortical infarcts along the left frontal operculum and left anterior insula with additional small area of acute cortical infarct within left parietal lobe. Other areas of faint cortical DWI hyperintensity throughout the left MCA territory, including the left putamen, may reflect additional areas of infarct or ischemia. 2. Loss of the left transverse sinus flow void is favored to reflect slow flow. If clinical suspicion for thrombosis, CT venography could be performed for further characterization.     PAIN:  Are you having pain? No   FALLS: Has patient fallen in last 6 months?  No   LIVING ENVIRONMENT: Lives with: lives with their family Lives in: House/apartment   PLOF:  Level of assistance: Independent with ADLs, Independent with IADLs Employment: Full-time employment     PATIENT GOALS    to be able to communicate again     OBJECTIVE:   TODAY'S TREATMENT:  Skilled treatment session focused on pt's communication goals. SLP facilitated session by providing the following interventions:    SLP introduced Text to Speech app - pt will remarkable ability to correctly type single words responses with 1 instance of independence sentence creation. Pt with improved speech intelligibility when reading his written response (~ 90%). He was also able to identify and use previously written responses.  Instruction provided to pt's son in ways to facilitate use of text to speech to increase speech intelligibility as well as increase language function Receptive Language: moderate repetition and written cues to aid in comprehension of basic questions   PATIENT  EDUCATION: Education details: see above Person educated: Patient and son Education method: Explanation Education comprehension: verbalized understanding and needs further education   HOME EXERCISE PROGRAM:        download Text to Speech app       GOALS:   Goals reviewed with patient? Yes   SHORT TERM GOALS: Target date: 10 sessions   UPDATED GOALS: 09/12/2022 The patient will name common household objects at 80% accuracy given frequent maximum verbal and frequent maximum phonemic cues. Goal status: INITIAL Goal Status: MET for names of family members Goal Updated: With moderate assistance, pt will name common household objects at 80% accuracy.    2.  The patient will identify the correct word given 2 choices at 80% accuracy given frequent maximal visual cues in order to increase ability to comprehend simple instructions. Goal status: INITIAL Goal status: Ongoing   3.  The patient will follow simple body commands presented auditorily at 80% accuracy given frequent maximal visual cues. Goal status: INITIAL Goal status: Ongoing   4.  The patient will say functional words (e.g., water, toilet) at 50% accuracy given frequent maximal phonemic placement cues in order to communicate ability to communicate basic wants  and needs. Goal status: INITIAL Goal status: MET Goal updated: The patient will say functional words (e.g., water, toilet) at 75% accuracy given frequent maximal phonemic placement cues in order to communicate ability to communicate basic wants and needs.     LONG TERM GOALS: Target date: 11/17/2022   Updated: 09/12/2022 Pt will use multimodal means of communication to express basic biographical information about himself with moderate assistance.  Baseline:  Goal status: INITIAL Goal status: ONGOING    2.  Pt will demonstrate > 90% speech intelligibility when reading orientation information.  Baseline:  Goal status: INITIAL Goal status: ONGOING     ASSESSMENT:    CLINICAL IMPRESSION: Pt presents with promising use of written language to facilitate verbal language and speech intelligibility.    OBJECTIVE IMPAIRMENTS include expressive language, receptive language, apraxia, and reading comprehension and ability to produce written language . These impairments are limiting patient from return to work, managing medications, managing appointments, managing finances, household responsibilities, ADLs/IADLs, and effectively communicating at home and in community. Factors affecting potential to achieve goals and functional outcome are severity of impairments. Patient will benefit from skilled SLP services to address above impairments and improve overall function.   REHAB POTENTIAL: Excellent   PLAN: SLP FREQUENCY: 3x/week to 4xweek   SLP DURATION: 12 weeks   PLANNED INTERVENTIONS: Language facilitation, Cueing hierachy, Functional tasks, Multimodal communication approach, SLP instruction and feedback, Compensatory strategies, and Patient/family education       Ethelean Colla B. Dreama Saa, M.S., CCC-SLP, Tree surgeon Certified Brain Injury Specialist [facility]  [facility] Rehabilitation Services Office [phone] Ascom [phone] Fax [phone]

## 2022-09-16 ENCOUNTER — Ambulatory Visit: Payer: BC Managed Care – PPO | Admitting: Speech Pathology

## 2022-09-16 DIAGNOSIS — I63512 Cerebral infarction due to unspecified occlusion or stenosis of left middle cerebral artery: Secondary | ICD-10-CM

## 2022-09-16 DIAGNOSIS — R4701 Aphasia: Secondary | ICD-10-CM

## 2022-09-16 DIAGNOSIS — R482 Apraxia: Secondary | ICD-10-CM

## 2022-09-16 NOTE — CV Procedure (Signed)
SURGEON:  [name], PA-C     PREPROCEDURE DIAGNOSIS:  Cryptogenic stroke    POSTPROCEDURE DIAGNOSIS: Cryptogenic stroke     PROCEDURES:   1. Implantable loop recorder implantation    INTRODUCTION:  [name] presents with a history of cryptogenic stroke The costs of loop recorder monitoring have been discussed with the patient.    DESCRIPTION OF PROCEDURE:  Informed written consent was obtained.   Time Out Completed with RN / Cath Lab Tech and Dr. Estill Dooms   The patient required no sedation for the procedure today.  Mapping over the patient's chest was performed to identify the area where electrograms were most prominent for ILR recording.  This area was found to be the left parasternal region over the 4th intercostal space. The patients left chest was therefore prepped and draped in the usual sterile fashion. The skin overlying the left parasternal region was infiltrated with lidocaine for local analgesia.  A 0.5-cm incision was made over the left parasternal region over the 3rd intercostal space.  A subcutaneous ILR pocket was fashioned using a combination of sharp and blunt dissection.  A [employer] Reveal LINQII (serial # [id number])  implantable loop recorder was then placed into the pocket  R waves were very prominent and measured >0.77mV.  Steri- Strips and a sterile dressing were then applied.  There were no early apparent complications.     Dr. Elberta Fortis was present during the entirety of the case for supervision and proctoring.    CONCLUSIONS:   1. Successful implantation of a implantable loop recorder for a history of cryptogenic stroke  2. No early apparent complications.   [name], PA-C  Cardiac Electrophysiology

## 2022-09-16 NOTE — Therapy (Unsigned)
OUTPATIENT SPEECH LANGUAGE PATHOLOGY  TREATMENT NOTE    Patient Name: [name] MRN: [id number] DOB:June 30, 1965, 56 y.o., male Today's Date: 09/16/2022   PCP: Jon Buddy, NP REFERRING PROVIDER: Mariam Dollar, PA   End of Session - 09/16/22 1606     Visit Number 13    Number of Visits 49    Date for SLP Re-Evaluation 11/17/22    Authorization Type [employer]    Progress Note Due on Visit 20    SLP Start Time 1100    SLP Stop Time  1200    SLP Time Calculation (min) 60 min    Activity Tolerance Patient tolerated treatment well             Past Medical History:  Diagnosis Date   Hypertension    LVH (left ventricular hypertrophy)    Myocardial infarction (HCC)    Vitamin D deficiency    Past Surgical History:  Procedure Laterality Date   ANTERIOR CRUCIATE LIGAMENT REPAIR     COLONOSCOPY WITH PROPOFOL N/A 04/11/2018   Procedure: COLONOSCOPY WITH PROPOFOL;  Surgeon: Toledo, Boykin Nearing, MD;  Location: [facility] ENDOSCOPY;  Service: Gastroenterology;  Laterality: N/A;   GASTRIC RESECTION     IR ANGIO INTRA EXTRACRAN SEL INTERNAL CAROTID UNI L MOD SED  08/15/2022   IR US GUIDE VASC ACCESS RIGHT  08/15/2022   LAPAROTOMY     LOOP RECORDER INSERTION N/A 08/18/2022   Procedure: LOOP RECORDER INSERTION;  Surgeon: Jon Lemming, MD;  Location: [facility] INVASIVE CV LAB;  Service: Cardiovascular;  Laterality: N/A;   RADIOLOGY WITH ANESTHESIA N/A 08/14/2022   Procedure: IR WITH ANESTHESIA;  Surgeon: Jon Cotton, MD;  Location: [facility] OR;  Service: Radiology;  Laterality: N/A;   TEE WITHOUT CARDIOVERSION N/A 08/18/2022   Procedure: TRANSESOPHAGEAL ECHOCARDIOGRAM;  Surgeon: Jon Fus, MD;  Location: [facility] INVASIVE CV LAB;  Service: Cardiovascular;  Laterality: N/A;   Patient Active Problem List   Diagnosis Date Noted   Essential hypertension, benign 08/18/2022   Hyperlipidemia 08/18/2022   DM (diabetes mellitus), type 2 (HCC) 08/18/2022   Obesity (BMI  30-39.9) 08/18/2022   CAD (coronary artery disease) 08/18/2022   OSA (obstructive sleep apnea) 08/18/2022   Acute ischemic left MCA stroke (HCC) 08/18/2022   Acute ischemic right MCA stroke (HCC) 08/14/2022   Hypertensive urgency 04/27/2017   ONSET DATE: 08/14/2022; date of referral 08/23/2022    REFERRING DIAG: W09.811 (ICD-10-CM) - Cerebral infarction due to unspecified occlusion or stenosis of left middle cerebral artery   THERAPY DIAG:  Aphasia   Apraxia   Cerebral infarction due to unspecified occlusion or stenosis of left middle cerebral artery (HCC)   Rationale for Evaluation and Treatment Rehabilitation   SUBJECTIVE:    SUBJECTIVE STATEMENT: Pt continues to be eager, son reports use of app last evening Pt accompanied by: pt's son [name])   PERTINENT HISTORY:  [name] is 57 year old male who presented to the [name] ED on 08/14/2022 noted by wife to be very weak and confused and halfway enter out of the bathtub at home. Symptoms were sudden in onset. Last known well 9 PM. Patient presented with global aphasia. Code stroke activated. CT head negative for hemorrhage, aspects of 9, mild hyperdensity left MCA could indicate thrombus. Tenecteplase administered. CTA showed left M1 occlusion and neurointerventional radiology contacted. Patient transferred to [name] for thrombectomy. On route he was noted to have periorbital welts and uvular swelling. Was concern of possible angioedema secondary to tenecteplase.  He was intubated for airway protection and underwent cerebral angiogram by [name]. Recanalization of the left MCA vascular tree noted. Critical care medicine consulted for management and placed on low-dose Neo-Synephrine infusion. Extubated on 4/23. 2D echo with EF of approximately 60 to 65% with trivial MVR. LDL 96, hemoglobin A1c 6.3%. He was started on heparin subcutaneously for VTE prophylaxis.     DIAGNOSTIC FINDINGS:    CT Head - 08/14/2022 Mild  hyperdensity of the left MCA   CT Head and Neck Angio - 08/14/2022 Proximal occlusion of the left MCA M1 segment with poor collateralization throughout the MCA territory.   IR Angio - 08/15/2022 Interval recanalization of the left M1/MCA occlusion seen on prior CT angiogram likely as a result of TNK administration. Therefore, no intervention performed.   MRI 08/15/2022 Acute cortical infarcts along the left frontal operculum and left anterior insula with additional small area of acute cortical infarct within left parietal lobe. Other areas of faint cortical DWI hyperintensity throughout the left MCA territory, including the left putamen, Warner reflect additional areas of infarct or ischemia. 2. Loss of the left transverse sinus flow void is favored to reflect slow flow. If clinical suspicion for thrombosis, CT venography could be performed for further characterization.     PAIN:  Are you having pain? No   FALLS: Has patient fallen in last 6 months?  No   LIVING ENVIRONMENT: Lives with: lives with their family Lives in: House/apartment   PLOF:  Level of assistance: Independent with ADLs, Independent with IADLs Employment: Full-time employment     PATIENT GOALS    to be able to communicate again     OBJECTIVE:   TODAY'S TREATMENT:  Skilled treatment session focused on pt's communication goals. SLP facilitated session by providing the following interventions:    Text to Speech app utilized to aid in word finding and increased speech intelligibility - with min A, pt able to type correct name of his family member as well as produce name with > 95% speech intelligibility. Pt unaware of incorrect verbal expression. Audio playback was helpful in building awareness.  Pt provided correct written response in 8 of 9 opportunities for family members names and responded correctly in 9 of 11 opportunities to verbal (with accompanying written prompts) for biographical questions Receptive  Language: moderate repetition and written cues to aid in comprehension of basic questions   PATIENT EDUCATION: Education details: see above Person educated: Patient and son Education method: Explanation Education comprehension: verbalized understanding and needs further education   HOME EXERCISE PROGRAM:        download Text to Speech app       GOALS:   Goals reviewed with patient? Yes   SHORT TERM GOALS: Target date: 10 sessions   UPDATED GOALS: 09/12/2022 The patient will name common household objects at 80% accuracy given frequent maximum verbal and frequent maximum phonemic cues. Goal status: INITIAL Goal Status: MET for names of family members Goal Updated: With moderate assistance, pt will name common household objects at 80% accuracy.    2.  The patient will identify the correct word given 2 choices at 80% accuracy given frequent maximal visual cues in order to increase ability to comprehend simple instructions. Goal status: INITIAL Goal status: Ongoing   3.  The patient will follow simple body commands presented auditorily at 80% accuracy given frequent maximal visual cues. Goal status: INITIAL Goal status: Ongoing   4.  The patient will say functional words (e.g.,  water, toilet) at 50% accuracy given frequent maximal phonemic placement cues in order to communicate ability to communicate basic wants and needs. Goal status: INITIAL Goal status: MET Goal updated: The patient will say functional words (e.g., water, toilet) at 75% accuracy given frequent maximal phonemic placement cues in order to communicate ability to communicate basic wants and needs.     LONG TERM GOALS: Target date: 11/17/2022   Updated: 09/12/2022 Pt will use multimodal means of communication to express basic biographical information about himself with moderate assistance.  Baseline:  Goal status: INITIAL Goal status: ONGOING    2.  Pt will demonstrate > 90% speech intelligibility when reading  orientation information.  Baseline:  Goal status: INITIAL Goal status: ONGOING     ASSESSMENT:   CLINICAL IMPRESSION: Pt continues to improve his understanding of the text to speech app and demonstrates improved desire to use.    OBJECTIVE IMPAIRMENTS include expressive language, receptive language, apraxia, and reading comprehension and ability to produce written language . These impairments are limiting patient from return to work, managing medications, managing appointments, managing finances, household responsibilities, ADLs/IADLs, and effectively communicating at home and in community. Factors affecting potential to achieve goals and functional outcome are severity of impairments. Patient will benefit from skilled SLP services to address above impairments and improve overall function.   REHAB POTENTIAL: Excellent   PLAN: SLP FREQUENCY: 3x/week to 4xweek   SLP DURATION: 12 weeks   PLANNED INTERVENTIONS: Language facilitation, Cueing hierachy, Functional tasks, Multimodal communication approach, SLP instruction and feedback, Compensatory strategies, and Patient/family education       Rolinda Impson B. Dreama Saa, M.S., CCC-SLP, Tree surgeon Certified Brain Injury Specialist [facility]  [facility] Rehabilitation Services Office [phone] Ascom [phone] Fax [phone]

## 2022-09-16 NOTE — Therapy (Unsigned)
OUTPATIENT SPEECH LANGUAGE PATHOLOGY  TREATMENT NOTE   Patient Name: [name] MRN: [id number] DOB:12/11/1965, 57 y.o., male Today's Date: 09/16/2022   PCP: Jon Buddy, NP REFERRING PROVIDER: Mariam Dollar, PA   End of Session - 09/16/22 1545     Visit Number 12    Number of Visits 49    Date for SLP Re-Evaluation 11/17/22    Authorization Type [employer]    Progress Note Due on Visit 20    SLP Start Time 1400    SLP Stop Time  1500    SLP Time Calculation (min) 60 min    Activity Tolerance Patient tolerated treatment well             Past Medical History:  Diagnosis Date   Hypertension    LVH (left ventricular hypertrophy)    Myocardial infarction (HCC)    Vitamin D deficiency    Past Surgical History:  Procedure Laterality Date   ANTERIOR CRUCIATE LIGAMENT REPAIR     COLONOSCOPY WITH PROPOFOL N/A 04/11/2018   Procedure: COLONOSCOPY WITH PROPOFOL;  Surgeon: Toledo, Boykin Nearing, MD;  Location: [facility] ENDOSCOPY;  Service: Gastroenterology;  Laterality: N/A;   GASTRIC RESECTION     IR ANGIO INTRA EXTRACRAN SEL INTERNAL CAROTID UNI L MOD SED  08/15/2022   IR US GUIDE VASC ACCESS RIGHT  08/15/2022   LAPAROTOMY     LOOP RECORDER INSERTION N/A 08/18/2022   Procedure: LOOP RECORDER INSERTION;  Surgeon: Jon Lemming, MD;  Location: [facility] INVASIVE CV [facility];  Service: Cardiovascular;  Laterality: N/A;   RADIOLOGY WITH ANESTHESIA N/A 08/14/2022   Procedure: IR WITH ANESTHESIA;  Surgeon: Jon Cotton, MD;  Location: [facility] OR;  Service: Radiology;  Laterality: N/A;   TEE WITHOUT CARDIOVERSION N/A 08/18/2022   Procedure: TRANSESOPHAGEAL ECHOCARDIOGRAM;  Surgeon: Jon Fus, MD;  Location: [facility] INVASIVE CV LAB;  Service: Cardiovascular;  Laterality: N/A;   Patient Active Problem List   Diagnosis Date Noted   Essential hypertension, benign 08/18/2022   Hyperlipidemia 08/18/2022   DM (diabetes mellitus), type 2 (HCC) 08/18/2022   Obesity (BMI  30-39.9) 08/18/2022   CAD (coronary artery disease) 08/18/2022   OSA (obstructive sleep apnea) 08/18/2022   Acute ischemic left MCA stroke (HCC) 08/18/2022   Acute ischemic right MCA stroke (HCC) 08/14/2022   Hypertensive urgency 04/27/2017   ONSET DATE: 08/14/2022; date of referral 08/23/2022    REFERRING DIAG: N82.956 (ICD-10-CM) - Cerebral infarction due to unspecified occlusion or stenosis of left middle cerebral artery   THERAPY DIAG:  Aphasia   Apraxia   Cerebral infarction due to unspecified occlusion or stenosis of left middle cerebral artery (HCC)   Rationale for Evaluation and Treatment Rehabilitation   SUBJECTIVE:    SUBJECTIVE STATEMENT: Pt accompanied by his sons [name] and [name]); continues to [employer] during initial greetings Pt accompanied by: pt's son [name])   PERTINENT HISTORY:  [name] is 57 year old male who presented to the [facility] ED on 08/14/2022 noted by wife to be very weak and confused and halfway enter out of the bathtub at home. Symptoms were sudden in onset. Last known well 9 PM. Patient presented with global aphasia. Code stroke activated. CT head negative for hemorrhage, aspects of 9, mild hyperdensity left MCA could indicate thrombus. Tenecteplase administered. CTA showed left M1 occlusion and neurointerventional radiology contacted. Patient transferred to [facility] for thrombectomy. On route he was noted to have periorbital welts and uvular swelling. Was concern of possible angioedema secondary  to tenecteplase. He was intubated for airway protection and underwent cerebral angiogram by Dr. Tommie Warner. Recanalization of the left MCA vascular tree noted. Critical care medicine consulted for management and placed on low-dose Neo-Synephrine infusion. Extubated on 4/23. 2D echo with EF of approximately 60 to 65% with trivial MVR. LDL 96, hemoglobin A1c 6.3%. He was started on heparin subcutaneously for VTE prophylaxis.     DIAGNOSTIC  FINDINGS:    CT Head - 08/14/2022 Mild hyperdensity of the left MCA   CT Head and Neck Angio - 08/14/2022 Proximal occlusion of the left MCA M1 segment with poor collateralization throughout the MCA territory.   IR Angio - 08/15/2022 Interval recanalization of the left M1/MCA occlusion seen on prior CT angiogram likely as a result of TNK administration. Therefore, no intervention performed.   MRI 08/15/2022 Acute cortical infarcts along the left frontal operculum and left anterior insula with additional small area of acute cortical infarct within left parietal lobe. Other areas of faint cortical DWI hyperintensity throughout the left MCA territory, including the left putamen, may reflect additional areas of infarct or ischemia. 2. Loss of the left transverse sinus flow void is favored to reflect slow flow. If clinical suspicion for thrombosis, CT venography could be performed for further characterization.     PAIN:  Are you having pain? No   FALLS: Has patient fallen in last 6 months?  No   LIVING ENVIRONMENT: Lives with: lives with their family Lives in: House/apartment   PLOF:  Level of assistance: Independent with ADLs, Independent with IADLs Employment: Full-time employment     PATIENT GOALS    to be able to communicate again     OBJECTIVE:   TODAY'S TREATMENT:  Skilled treatment session focused on pt's communication goals. SLP facilitated session by providing the following interventions:    Pt's sons report that pt has refused to use Text to Speech app when communicating. Suspect this is largely related to decreased awareness of speech errors as well as increased language abilities when using app to supplement gestures and verbal language. Education provided on need for pt to practice using improved speech intelligibility via repetition of the apps output Receptive Language: moderate repetition and written cues to aid in Comprehension of basic questions improved  pt's ability to respond via text to speech app to 90% Expressive language: when using text to speech app, pt able to write 3 word phrases as response   PATIENT EDUCATION: Education details: see above Person educated: Patient and son Education method: Explanation Education comprehension: verbalized understanding and needs further education   HOME EXERCISE PROGRAM:        download Text to Speech app       GOALS:   Goals reviewed with patient? Yes   SHORT TERM GOALS: Target date: 10 sessions   UPDATED GOALS: 09/12/2022 The patient will name common household objects at 80% accuracy given frequent maximum verbal and frequent maximum phonemic cues. Goal status: INITIAL Goal Status: MET for names of family members Goal Updated: With moderate assistance, pt will name common household objects at 80% accuracy.    2.  The patient will identify the correct word given 2 choices at 80% accuracy given frequent maximal visual cues in order to increase ability to comprehend simple instructions. Goal status: INITIAL Goal status: Ongoing   3.  The patient will follow simple body commands presented auditorily at 80% accuracy given frequent maximal visual cues. Goal status: INITIAL Goal status: Ongoing   4.  The patient will say functional words (e.g., water, toilet) at 50% accuracy given frequent maximal phonemic placement cues in order to communicate ability to communicate basic wants and needs. Goal status: INITIAL Goal status: MET Goal updated: The patient will say functional words (e.g., water, toilet) at 75% accuracy given frequent maximal phonemic placement cues in order to communicate ability to communicate basic wants and needs.     LONG TERM GOALS: Target date: 11/17/2022   Updated: 09/12/2022 Pt will use multimodal means of communication to express basic biographical information about himself with moderate assistance.  Baseline:  Goal status: INITIAL Goal status: ONGOING    2.   Pt will demonstrate > 90% speech intelligibility when reading orientation information.  Baseline:  Goal status: INITIAL Goal status: ONGOING     ASSESSMENT:   CLINICAL IMPRESSION: Pt presents with promising use of written language to facilitate verbal language and speech intelligibility.    OBJECTIVE IMPAIRMENTS include expressive language, receptive language, apraxia, and reading comprehension and ability to produce written language . These impairments are limiting patient from return to work, managing medications, managing appointments, managing finances, household responsibilities, ADLs/IADLs, and effectively communicating at home and in community. Factors affecting potential to achieve goals and functional outcome are severity of impairments. Patient will benefit from skilled SLP services to address above impairments and improve overall function.   REHAB POTENTIAL: Excellent   PLAN: SLP FREQUENCY: 3x/week to 4xweek   SLP DURATION: 12 weeks   PLANNED INTERVENTIONS: Language facilitation, Cueing hierachy, Functional tasks, Multimodal communication approach, SLP instruction and feedback, Compensatory strategies, and Patient/family education       Nasser Ku B. Dreama Saa, M.S., CCC-SLP, Tree surgeon Certified Brain Injury Specialist [facility]  [facility] Rehabilitation Services Office [phone] Ascom [phone] Fax [phone]

## 2022-09-20 ENCOUNTER — Ambulatory Visit: Payer: BC Managed Care – PPO | Admitting: Speech Pathology

## 2022-09-20 DIAGNOSIS — R482 Apraxia: Secondary | ICD-10-CM

## 2022-09-20 DIAGNOSIS — I63512 Cerebral infarction due to unspecified occlusion or stenosis of left middle cerebral artery: Secondary | ICD-10-CM

## 2022-09-20 DIAGNOSIS — R4701 Aphasia: Secondary | ICD-10-CM

## 2022-09-20 NOTE — Therapy (Unsigned)
OUTPATIENT SPEECH LANGUAGE PATHOLOGY  TREATMENT NOTE    Patient Name: [name] MRN: [id number] DOB:08-13-1965, 57 y.o., male Today's Date: 09/20/2022   PCP: Myrene Buddy, NP REFERRING PROVIDER: Mariam Dollar, PA   End of Session - 09/20/22 1609     Visit Number 14    Number of Visits 49    Date for SLP Re-Evaluation 11/17/22    Authorization Type [employer]    Progress Note Due on Visit 20    SLP Start Time 1100    SLP Stop Time  1200    SLP Time Calculation (min) 60 min    Activity Tolerance Patient tolerated treatment well             Past Medical History:  Diagnosis Date   Hypertension    LVH (left ventricular hypertrophy)    Myocardial infarction (HCC)    Vitamin D deficiency    Past Surgical History:  Procedure Laterality Date   ANTERIOR CRUCIATE LIGAMENT REPAIR     COLONOSCOPY WITH PROPOFOL N/A 04/11/2018   Procedure: COLONOSCOPY WITH PROPOFOL;  Surgeon: Toledo, Boykin Nearing, MD;  Location: [facility] ENDOSCOPY;  Service: Gastroenterology;  Laterality: N/A;   GASTRIC RESECTION     IR ANGIO INTRA EXTRACRAN SEL INTERNAL CAROTID UNI L MOD SED  08/15/2022   IR US GUIDE VASC ACCESS RIGHT  08/15/2022   LAPAROTOMY     LOOP RECORDER INSERTION N/A 08/18/2022   Procedure: LOOP RECORDER INSERTION;  Surgeon: Regan Lemming, MD;  Location: [facility] INVASIVE CV LAB;  Service: Cardiovascular;  Laterality: N/A;   RADIOLOGY WITH ANESTHESIA N/A 08/14/2022   Procedure: IR WITH ANESTHESIA;  Surgeon: Julieanne Cotton, MD;  Location: [facility] OR;  Service: Radiology;  Laterality: N/A;   TEE WITHOUT CARDIOVERSION N/A 08/18/2022   Procedure: TRANSESOPHAGEAL ECHOCARDIOGRAM;  Surgeon: [name], MD;  Location: [facility] INVASIVE CV LAB;  Service: Cardiovascular;  Laterality: N/A;   Patient Active Problem List   Diagnosis Date Noted   Essential hypertension, benign 08/18/2022   Hyperlipidemia 08/18/2022   DM (diabetes mellitus), type 2 (HCC) 08/18/2022   Obesity (BMI  30-39.9) 08/18/2022   CAD (coronary artery disease) 08/18/2022   OSA (obstructive sleep apnea) 08/18/2022   Acute ischemic left MCA stroke (HCC) 08/18/2022   Acute ischemic right MCA stroke (HCC) 08/14/2022   Hypertensive urgency 04/27/2017   ONSET DATE: 08/14/2022; date of referral 08/23/2022    REFERRING DIAG: W09.811 (ICD-10-CM) - Cerebral infarction due to unspecified occlusion or stenosis of left middle cerebral artery   THERAPY DIAG:  Aphasia   Apraxia   Cerebral infarction due to unspecified occlusion or stenosis of left middle cerebral artery (HCC)   Rationale for Evaluation and Treatment Rehabilitation   SUBJECTIVE:    SUBJECTIVE STATEMENT: Pt continues to be eager, son reports use of app last evening Pt accompanied by: pt's son [name])   PERTINENT HISTORY:  Dipesh Pfohl is 57 year old male who presented to the [facility] ED on 08/14/2022 noted by wife to be very weak and confused and halfway enter out of the bathtub at home. Symptoms were sudden in onset. Last known well 9 PM. Patient presented with global aphasia. Code stroke activated. CT head negative for hemorrhage, aspects of 9, mild hyperdensity left MCA could indicate thrombus. Tenecteplase administered. CTA showed left M1 occlusion and neurointerventional radiology contacted. Patient transferred to [facility] for thrombectomy. On route he was noted to have periorbital welts and uvular swelling. Was concern of possible angioedema secondary to tenecteplase.  He was intubated for airway protection and underwent cerebral angiogram by Dr. Tommie Sams. Recanalization of the left MCA vascular tree noted. Critical care medicine consulted for management and placed on low-dose Neo-Synephrine infusion. Extubated on 4/23. 2D echo with EF of approximately 60 to 65% with trivial MVR. LDL 96, hemoglobin A1c 6.3%. He was started on heparin subcutaneously for VTE prophylaxis.     DIAGNOSTIC FINDINGS:    CT Head - 08/14/2022 Mild  hyperdensity of the left MCA   CT Head and Neck Angio - 08/14/2022 Proximal occlusion of the left MCA M1 segment with poor collateralization throughout the MCA territory.   IR Angio - 08/15/2022 Interval recanalization of the left M1/MCA occlusion seen on prior CT angiogram likely as a result of TNK administration. Therefore, no intervention performed.   MRI 08/15/2022 Acute cortical infarcts along the left frontal operculum and left anterior insula with additional small area of acute cortical infarct within left parietal lobe. Other areas of faint cortical DWI hyperintensity throughout the left MCA territory, including the left putamen, may reflect additional areas of infarct or ischemia. 2. Loss of the left transverse sinus flow void is favored to reflect slow flow. If clinical suspicion for thrombosis, CT venography could be performed for further characterization.     PAIN:  Are you having pain? No   FALLS: Has patient fallen in last 6 months?  No   LIVING ENVIRONMENT: Lives with: lives with their family Lives in: House/apartment   PLOF:  Level of assistance: Independent with ADLs, Independent with IADLs Employment: Full-time employment     PATIENT GOALS    to be able to communicate again     OBJECTIVE:   TODAY'S TREATMENT:  Skilled treatment session focused on pt's communication goals. SLP facilitated session by providing the following interventions:    Text to Speech app utilized to aid in word finding and increased speech intelligibility - with min A, pt able to type correct name of his family member as well as produce name with > 95% speech intelligibility. Pt unaware of incorrect verbal expression. Audio playback was helpful in building awareness.  Pt provided correct written response in 8 of 9 opportunities for family members names and responded correctly in 9 of 11 opportunities to verbal (with accompanying written prompts) for biographical questions Text to  Speech app utilized to promote object naming - given set of 10 basic objects pt able to name 4 of 10 correcting with jargon or perseveration for remaining 6 objects, pt able to type correct word for 5 of 10 objects and intelligibly imitate object name in 9 of 10 opportunities Text to speech app utilized to promote generative naming - pt able to list 10 animals with 100% speech intelligibility imitating app's response    PATIENT EDUCATION: Education details: see above Person educated: Patient and son Education method: Explanation Education comprehension: verbalized understanding and needs further education   HOME EXERCISE PROGRAM:        generative naming task     GOALS:   Goals reviewed with patient? Yes   SHORT TERM GOALS: Target date: 10 sessions   UPDATED GOALS: 09/12/2022 The patient will name common household objects at 80% accuracy given frequent maximum verbal and frequent maximum phonemic cues. Goal status: INITIAL Goal Status: MET for names of family members Goal Updated: With moderate assistance, pt will name common household objects at 80% accuracy.    2.  The patient will identify the correct word given 2 choices at 80%  accuracy given frequent maximal visual cues in order to increase ability to comprehend simple instructions. Goal status: INITIAL Goal status: Ongoing   3.  The patient will follow simple body commands presented auditorily at 80% accuracy given frequent maximal visual cues. Goal status: INITIAL Goal status: Ongoing   4.  The patient will say functional words (e.g., water, toilet) at 50% accuracy given frequent maximal phonemic placement cues in order to communicate ability to communicate basic wants and needs. Goal status: INITIAL Goal status: MET Goal updated: The patient will say functional words (e.g., water, toilet) at 75% accuracy given frequent maximal phonemic placement cues in order to communicate ability to communicate basic wants and needs.      LONG TERM GOALS: Target date: 11/17/2022   Updated: 09/12/2022 Pt will use multimodal means of communication to express basic biographical information about himself with moderate assistance.  Baseline:  Goal status: INITIAL Goal status: ONGOING    2.  Pt will demonstrate > 90% speech intelligibility when reading orientation information.  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 See treatment note for details. Use of Text to Speech app beneficial for improved word finding and speech intelligibility.    OBJECTIVE IMPAIRMENTS include expressive language, receptive language, apraxia, and reading comprehension and ability to produce written language . These impairments are limiting patient from return to work, managing medications, managing appointments, managing finances, household responsibilities, ADLs/IADLs, and effectively communicating at home and in community. Factors affecting potential to achieve goals and functional outcome are severity of impairments. Patient will benefit from skilled SLP services to address above impairments and improve overall function.   REHAB POTENTIAL: Excellent   PLAN: SLP FREQUENCY: 3x/week to 4xweek   SLP DURATION: 12 weeks   PLANNED INTERVENTIONS: Language facilitation, Cueing hierachy, Functional tasks, Multimodal communication approach, SLP instruction and feedback, Compensatory strategies, and Patient/family education       Debroah Shuttleworth B. Dreama Saa, M.S., CCC-SLP, Tree surgeon Certified Brain Injury Specialist [facility]  [facility] Rehabilitation Services Office [phone] Ascom [phone] Fax [phone]

## 2022-09-21 ENCOUNTER — Ambulatory Visit: Payer: BC Managed Care – PPO | Admitting: Speech Pathology

## 2022-09-21 DIAGNOSIS — R4701 Aphasia: Secondary | ICD-10-CM

## 2022-09-21 DIAGNOSIS — R482 Apraxia: Secondary | ICD-10-CM

## 2022-09-21 DIAGNOSIS — I63512 Cerebral infarction due to unspecified occlusion or stenosis of left middle cerebral artery: Secondary | ICD-10-CM

## 2022-09-22 ENCOUNTER — Ambulatory Visit: Payer: BC Managed Care – PPO | Admitting: Speech Pathology

## 2022-09-22 DIAGNOSIS — R482 Apraxia: Secondary | ICD-10-CM

## 2022-09-22 DIAGNOSIS — R4701 Aphasia: Secondary | ICD-10-CM

## 2022-09-22 DIAGNOSIS — I63512 Cerebral infarction due to unspecified occlusion or stenosis of left middle cerebral artery: Secondary | ICD-10-CM

## 2022-09-22 NOTE — Therapy (Signed)
OUTPATIENT SPEECH LANGUAGE PATHOLOGY  TREATMENT NOTE    Patient Name: [name] MRN: [id number] DOB:1966-01-14, 57 y.o., male Today's Date: 09/22/2022    PCP: Jon Buddy, NP REFERRING PROVIDER: Mariam Dollar, PA   End of Session - 09/22/22 1615     Visit Number 16    Number of Visits 49    Date for SLP Re-Evaluation 11/17/22    Authorization Type [employer]    Progress Note Due on Visit 20    SLP Start Time 1100    SLP Stop Time  1200    SLP Time Calculation (min) 60 min    Activity Tolerance Patient tolerated treatment well              Past Medical History:  Diagnosis Date   Hypertension    LVH (left ventricular hypertrophy)    Myocardial infarction (HCC)    Vitamin D deficiency    Past Surgical History:  Procedure Laterality Date   ANTERIOR CRUCIATE LIGAMENT REPAIR     COLONOSCOPY WITH PROPOFOL N/A 04/11/2018   Procedure: COLONOSCOPY WITH PROPOFOL;  Surgeon: Toledo, Boykin Nearing, MD;  Location: [facility] ENDOSCOPY;  Service: Gastroenterology;  Laterality: N/A;   GASTRIC RESECTION     IR ANGIO INTRA EXTRACRAN SEL INTERNAL CAROTID UNI L MOD SED  08/15/2022   IR US GUIDE VASC ACCESS RIGHT  08/15/2022   LAPAROTOMY     LOOP RECORDER INSERTION N/A 08/18/2022   Procedure: LOOP RECORDER INSERTION;  Surgeon: Regan Lemming, MD;  Location: [facility] INVASIVE CV LAB;  Service: Cardiovascular;  Laterality: N/A;   RADIOLOGY WITH ANESTHESIA N/A 08/14/2022   Procedure: IR WITH ANESTHESIA;  Surgeon: Julieanne Cotton, MD;  Location: [facility] OR;  Service: Radiology;  Laterality: N/A;   TEE WITHOUT CARDIOVERSION N/A 08/18/2022   Procedure: TRANSESOPHAGEAL ECHOCARDIOGRAM;  Surgeon: [name], MD;  Location: [facility] CV LAB;  Service: Cardiovascular;  Laterality: N/A;   Patient Active Problem List   Diagnosis Date Noted   Essential hypertension, benign 08/18/2022   Hyperlipidemia 08/18/2022   DM (diabetes mellitus), type 2 (HCC) 08/18/2022   Obesity  (BMI 30-39.9) 08/18/2022   CAD (coronary artery disease) 08/18/2022   OSA (obstructive sleep apnea) 08/18/2022   Acute ischemic left MCA stroke (HCC) 08/18/2022   Acute ischemic right MCA stroke (HCC) 08/14/2022   Hypertensive urgency 04/27/2017   ONSET DATE: 08/14/2022; date of referral 08/23/2022    REFERRING DIAG: W09.811 (ICD-10-CM) - Cerebral infarction due to unspecified occlusion or stenosis of left middle cerebral artery   THERAPY DIAG:  Aphasia   Apraxia   Cerebral infarction due to unspecified occlusion or stenosis of left middle cerebral artery (HCC)   Rationale for Evaluation and Treatment Rehabilitation   SUBJECTIVE:      PERTINENT HISTORY:  [name] is 57 year old male who presented to the [facility] ED on 08/14/2022 noted by wife to be very weak and confused and halfway enter out of the bathtub at home. Symptoms were sudden in onset. Last known well 9 PM. Patient presented with global aphasia. Code stroke activated. CT head negative for hemorrhage, aspects of 9, mild hyperdensity left MCA could indicate thrombus. Tenecteplase administered. CTA showed left M1 occlusion and neurointerventional radiology contacted. Patient transferred to [facility] for thrombectomy. On route he was noted to have periorbital welts and uvular swelling. Was concern of possible angioedema secondary to tenecteplase. He was intubated for airway protection and underwent cerebral angiogram by Dr. Tommie Sams. Recanalization of the  left MCA vascular tree noted. Critical care medicine consulted for management and placed on low-dose Neo-Synephrine infusion. Extubated on 4/23. 2D echo with EF of approximately 60 to 65% with trivial MVR. LDL 96, hemoglobin A1c 6.3%. He was started on heparin subcutaneously for VTE prophylaxis.     DIAGNOSTIC FINDINGS:    CT Head - 08/14/2022 Mild hyperdensity of the left MCA   CT Head and Neck Angio - 08/14/2022 Proximal occlusion of the left MCA M1 segment  with poor collateralization throughout the MCA territory.   IR Angio - 08/15/2022 Interval recanalization of the left M1/MCA occlusion seen on prior CT angiogram likely as a result of TNK administration. Therefore, no intervention performed.   MRI 08/15/2022 Acute cortical infarcts along the left frontal operculum and left anterior insula with additional small area of acute cortical infarct within left parietal lobe. Other areas of faint cortical DWI hyperintensity throughout the left MCA territory, including the left putamen, may reflect additional areas of infarct or ischemia. 2. Loss of the left transverse sinus flow void is favored to reflect slow flow. If clinical suspicion for thrombosis, CT venography could be performed for further characterization.     PAIN:  Are you having pain? No   FALLS: Has patient fallen in last 6 months?  No   LIVING ENVIRONMENT: Lives with: lives with their family Lives in: House/apartment   PLOF:  Level of assistance: Independent with ADLs, Independent with IADLs Employment: Full-time employment     PATIENT GOALS    to be able to communicate again   SUBJECTIVE STATEMENT: Pt continues to be eager, son reports use of app last evening, new entries noted Pt accompanied by: pt's son [name])   OBJECTIVE:   TODAY'S TREATMENT:  Skilled treatment session focused on pt's communication goals. SLP facilitated session by providing the following interventions:    Text to Speech app utilized to aid in word finding and increased speech intelligibility - pt had app pulled up with starred phrases up - pt selected appropriate name for each family member's name shown; 100% speech intelligibility reinforced by use in SLP sentences describing each family member - pt much less perseverative during today's session Text to Speech app utilized to promote object naming - again, same as above, pt able to locate previously entered object name in app with 100% speech  intelligibility including requests for repetition from pt Text to speech app utilized to promote generative naming - pt able to list 10 vegetables with rare Min A for word finding     PATIENT EDUCATION: Education details: see above Person educated: Patient and son Education method: Explanation Education comprehension: verbalized understanding and needs further education   HOME EXERCISE PROGRAM:        generative naming task     GOALS:   Goals reviewed with patient? Yes   SHORT TERM GOALS: Target date: 10 sessions   UPDATED GOALS: 09/12/2022 The patient will name common household objects at 80% accuracy given frequent maximum verbal and frequent maximum phonemic cues. Goal status: INITIAL Goal Status: MET for names of family members Goal Updated: With moderate assistance, pt will name common household objects at 80% accuracy.    2.  The patient will identify the correct word given 2 choices at 80% accuracy given frequent maximal visual cues in order to increase ability to comprehend simple instructions. Goal status: INITIAL Goal status: Ongoing   3.  The patient will follow simple body commands presented auditorily at 80% accuracy given frequent maximal  visual cues. Goal status: INITIAL Goal status: Ongoing   4.  The patient will say functional words (e.g., water, toilet) at 50% accuracy given frequent maximal phonemic placement cues in order to communicate ability to communicate basic wants and needs. Goal status: INITIAL Goal status: MET Goal updated: The patient will say functional words (e.g., water, toilet) at 75% accuracy given frequent maximal phonemic placement cues in order to communicate ability to communicate basic wants and needs.     LONG TERM GOALS: Target date: 11/17/2022   Updated: 09/12/2022 Pt will use multimodal means of communication to express basic biographical information about himself with moderate assistance.  Baseline:  Goal status: INITIAL Goal  status: ONGOING    2.  Pt will demonstrate > 90% speech intelligibility when reading orientation information.  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 See treatment note for details. Use of Text to Speech app beneficial for improved word finding and speech intelligibility - including pt using to provide additional conservational information such as "beef" in conversation about hotdogs indicating increased receptive language abilities.    OBJECTIVE IMPAIRMENTS include expressive language, receptive language, apraxia, and reading comprehension and ability to produce written language . These impairments are limiting patient from return to work, managing medications, managing appointments, managing finances, household responsibilities, ADLs/IADLs, and effectively communicating at home and in community. Factors affecting potential to achieve goals and functional outcome are severity of impairments. Patient will benefit from skilled SLP services to address above impairments and improve overall function.   REHAB POTENTIAL: Excellent   PLAN: SLP FREQUENCY: 3x/week to 4xweek   SLP DURATION: 12 weeks   PLANNED INTERVENTIONS: Language facilitation, Cueing hierachy, Functional tasks, Multimodal communication approach, SLP instruction and feedback, Compensatory strategies, and Patient/family education       Domonique Cothran B. Dreama Saa, M.S., CCC-SLP, Tree surgeon Certified Brain Injury Specialist [facility]  [facility] Office [phone] Ascom [phone] Fax [phone]

## 2022-09-22 NOTE — Therapy (Signed)
OUTPATIENT SPEECH LANGUAGE PATHOLOGY  TREATMENT NOTE    Patient Name: [name] MRN: [id number] DOB:12-15-1965, 57 y.o., male Today's Date: 09/21/2022   PCP: Jon Buddy, NP REFERRING PROVIDER: Mariam Dollar, PA   End of Session - 09/21/22 1041     Visit Number 15    Number of Visits 49    Date for SLP Re-Evaluation 11/17/22    Authorization Type [employer]    Progress Note Due on Visit 20    SLP Start Time 1300    SLP Stop Time  1400    SLP Time Calculation (min) 60 min    Activity Tolerance Patient tolerated treatment well             Past Medical History:  Diagnosis Date   Hypertension    LVH (left ventricular hypertrophy)    Myocardial infarction (HCC)    Vitamin D deficiency    Past Surgical History:  Procedure Laterality Date   ANTERIOR CRUCIATE LIGAMENT REPAIR     COLONOSCOPY WITH PROPOFOL N/A 04/11/2018   Procedure: COLONOSCOPY WITH PROPOFOL;  Surgeon: Toledo, Boykin Nearing, MD;  Location: [facility] ENDOSCOPY;  Service: Gastroenterology;  Laterality: N/A;   GASTRIC RESECTION     IR ANGIO INTRA EXTRACRAN SEL INTERNAL CAROTID UNI L MOD SED  08/15/2022   IR US GUIDE VASC ACCESS RIGHT  08/15/2022   LAPAROTOMY     LOOP RECORDER INSERTION N/A 08/18/2022   Procedure: LOOP RECORDER INSERTION;  Surgeon: Jon Lemming, MD;  Location: [facility] INVASIVE CV LAB;  Service: Cardiovascular;  Laterality: N/A;   RADIOLOGY WITH ANESTHESIA N/A 08/14/2022   Procedure: IR WITH ANESTHESIA;  Surgeon: Jon Cotton, MD;  Location: [facility] OR;  Service: Radiology;  Laterality: N/A;   TEE WITHOUT CARDIOVERSION N/A 08/18/2022   Procedure: TRANSESOPHAGEAL ECHOCARDIOGRAM;  Surgeon: Jon Fus, MD;  Location: [facility] INVASIVE CV LAB;  Service: Cardiovascular;  Laterality: N/A;   Patient Active Problem List   Diagnosis Date Noted   Essential hypertension, benign 08/18/2022   Hyperlipidemia 08/18/2022   DM (diabetes mellitus), type 2 (HCC) 08/18/2022   Obesity (BMI  30-39.9) 08/18/2022   CAD (coronary artery disease) 08/18/2022   OSA (obstructive sleep apnea) 08/18/2022   Acute ischemic left MCA stroke (HCC) 08/18/2022   Acute ischemic right MCA stroke (HCC) 08/14/2022   Hypertensive urgency 04/27/2017   ONSET DATE: 08/14/2022; date of referral 08/23/2022    REFERRING DIAG: I69.629 (ICD-10-CM) - Cerebral infarction due to unspecified occlusion or stenosis of left middle cerebral artery   THERAPY DIAG:  Aphasia   Apraxia   Cerebral infarction due to unspecified occlusion or stenosis of left middle cerebral artery (HCC)   Rationale for Evaluation and Treatment Rehabilitation   SUBJECTIVE:    SUBJECTIVE STATEMENT: Pt continues to be eager, son reports use of app last evening Pt accompanied by: pt's son [name])   PERTINENT HISTORY:  [name] is 57 year old male who presented to the [facility] ED on 08/14/2022 noted by wife to be very weak and confused and halfway enter out of the bathtub at home. Symptoms were sudden in onset. Last known well 9 PM. Patient presented with global aphasia. Code stroke activated. CT head negative for hemorrhage, aspects of 9, mild hyperdensity left MCA could indicate thrombus. Tenecteplase administered. CTA showed left M1 occlusion and neurointerventional radiology contacted. Patient transferred to [facility] for thrombectomy. On route he was noted to have periorbital welts and uvular swelling. Was concern of possible angioedema secondary to tenecteplase.  He was intubated for airway protection and underwent cerebral angiogram by Dr. Tommie Warner. Recanalization of the left MCA vascular tree noted. Critical care medicine consulted for management and placed on low-dose Neo-Synephrine infusion. Extubated on 4/23. 2D echo with EF of approximately 60 to 65% with trivial MVR. LDL 96, hemoglobin A1c 6.3%. He was started on heparin subcutaneously for VTE prophylaxis.     DIAGNOSTIC FINDINGS:    CT Head - 08/14/2022 Mild  hyperdensity of the left MCA   CT Head and Neck Angio - 08/14/2022 Proximal occlusion of the left MCA M1 segment with poor collateralization throughout the MCA territory.   IR Angio - 08/15/2022 Interval recanalization of the left M1/MCA occlusion seen on prior CT angiogram likely as a result of TNK administration. Therefore, no intervention performed.   MRI 08/15/2022 Acute cortical infarcts along the left frontal operculum and left anterior insula with additional small area of acute cortical infarct within left parietal lobe. Other areas of faint cortical DWI hyperintensity throughout the left MCA territory, including the left putamen, may reflect additional areas of infarct or ischemia. 2. Loss of the left transverse sinus flow void is favored to reflect slow flow. If clinical suspicion for thrombosis, CT venography could be performed for further characterization.     PAIN:  Are you having pain? No   FALLS: Has patient fallen in last 6 months?  No   LIVING ENVIRONMENT: Lives with: lives with their family Lives in: House/apartment   PLOF:  Level of assistance: Independent with ADLs, Independent with IADLs Employment: Full-time employment     PATIENT GOALS    to be able to communicate again     OBJECTIVE:   TODAY'S TREATMENT:  Skilled treatment session focused on pt's communication goals. SLP facilitated session by providing the following interventions:    Text to Speech app utilized to aid in word finding and increased speech intelligibility - with min A, pt able to type correct name of his family member as well as produce name with > 95% speech intelligibility. Pt unaware of incorrect verbal expression. Audio playback was helpful in building awareness.  Pt provided correct written response in 8 of 9 opportunities for family members names and responded correctly in 9 of 11 opportunities to verbal (with accompanying written prompts) for biographical questions Text to  Speech app utilized to promote object naming - given set of 10 basic objects pt able to name 4 of 10 correcting with jargon or perseveration for remaining 6 objects, pt able to type correct word for 5 of 10 objects and intelligibly imitate object name in 9 of 10 opportunities Text to speech app utilized to promote generative naming - pt able to list 10 animals with 100% speech intelligibility imitating app's response    PATIENT EDUCATION: Education details: see above Person educated: Patient and son Education method: Explanation Education comprehension: verbalized understanding and needs further education   HOME EXERCISE PROGRAM:        generative naming task     GOALS:   Goals reviewed with patient? Yes   SHORT TERM GOALS: Target date: 10 sessions   UPDATED GOALS: 09/12/2022 The patient will name common household objects at 80% accuracy given frequent maximum verbal and frequent maximum phonemic cues. Goal status: INITIAL Goal Status: MET for names of family members Goal Updated: With moderate assistance, pt will name common household objects at 80% accuracy.    2.  The patient will identify the correct word given 2 choices at 80%  accuracy given frequent maximal visual cues in order to increase ability to comprehend simple instructions. Goal status: INITIAL Goal status: Ongoing   3.  The patient will follow simple body commands presented auditorily at 80% accuracy given frequent maximal visual cues. Goal status: INITIAL Goal status: Ongoing   4.  The patient will say functional words (e.g., water, toilet) at 50% accuracy given frequent maximal phonemic placement cues in order to communicate ability to communicate basic wants and needs. Goal status: INITIAL Goal status: MET Goal updated: The patient will say functional words (e.g., water, toilet) at 75% accuracy given frequent maximal phonemic placement cues in order to communicate ability to communicate basic wants and needs.      LONG TERM GOALS: Target date: 11/17/2022   Updated: 09/12/2022 Pt will use multimodal means of communication to express basic biographical information about himself with moderate assistance.  Baseline:  Goal status: INITIAL Goal status: ONGOING    2.  Pt will demonstrate > 90% speech intelligibility when reading orientation information.  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 See treatment note for details. Use of Text to Speech app beneficial for improved word finding and speech intelligibility.    OBJECTIVE IMPAIRMENTS include expressive language, receptive language, apraxia, and reading comprehension and ability to produce written language . These impairments are limiting patient from return to work, managing medications, managing appointments, managing finances, household responsibilities, ADLs/IADLs, and effectively communicating at home and in community. Factors affecting potential to achieve goals and functional outcome are severity of impairments. Patient will benefit from skilled SLP services to address above impairments and improve overall function.   REHAB POTENTIAL: Excellent   PLAN: SLP FREQUENCY: 3x/week to 4xweek   SLP DURATION: 12 weeks   PLANNED INTERVENTIONS: Language facilitation, Cueing hierachy, Functional tasks, Multimodal communication approach, SLP instruction and feedback, Compensatory strategies, and Patient/family education       Jon Balderston B. Dreama Saa, M.S., CCC-SLP, Tree surgeon Certified Brain Injury Specialist [facility]  [facility] Office [phone] Ascom [phone] Fax [phone]

## 2022-09-23 ENCOUNTER — Ambulatory Visit: Payer: BC Managed Care – PPO | Admitting: Speech Pathology

## 2022-09-23 DIAGNOSIS — R482 Apraxia: Secondary | ICD-10-CM

## 2022-09-23 DIAGNOSIS — R4701 Aphasia: Secondary | ICD-10-CM

## 2022-09-23 DIAGNOSIS — I63512 Cerebral infarction due to unspecified occlusion or stenosis of left middle cerebral artery: Secondary | ICD-10-CM

## 2022-09-23 NOTE — Therapy (Signed)
OUTPATIENT SPEECH LANGUAGE PATHOLOGY  TREATMENT NOTE    Patient Name: [name] MRN: [id number] DOB:Sep 25, 1965, 57 y.o., male Today's Date: 09/23/2022    PCP: Jon Buddy, NP REFERRING PROVIDER: Mariam Dollar, PA   End of Session - 09/23/22 1219     Number of Visits 49    Date for SLP Re-Evaluation 11/17/22    Authorization Type [employer]    Progress Note Due on Visit 20    SLP Start Time 1105    SLP Stop Time  1150    SLP Time Calculation (min) 45 min    Activity Tolerance Patient tolerated treatment well              Past Medical History:  Diagnosis Date   Hypertension    LVH (left ventricular hypertrophy)    Myocardial infarction (HCC)    Vitamin D deficiency    Past Surgical History:  Procedure Laterality Date   ANTERIOR CRUCIATE LIGAMENT REPAIR     COLONOSCOPY WITH PROPOFOL N/A 04/11/2018   Procedure: COLONOSCOPY WITH PROPOFOL;  Surgeon: [name], Jon Nearing, MD;  Location: [facility] ENDOSCOPY;  Service: Gastroenterology;  Laterality: N/A;   GASTRIC RESECTION     IR ANGIO INTRA EXTRACRAN SEL INTERNAL CAROTID UNI L MOD SED  08/15/2022   IR US GUIDE VASC ACCESS RIGHT  08/15/2022   LAPAROTOMY     LOOP RECORDER INSERTION N/A 08/18/2022   Procedure: LOOP RECORDER INSERTION;  Surgeon: Jon Lemming, MD;  Location: [facility] INVASIVE CV LAB;  Service: Cardiovascular;  Laterality: N/A;   RADIOLOGY WITH ANESTHESIA N/A 08/14/2022   Procedure: IR WITH ANESTHESIA;  Surgeon: Jon Cotton, MD;  Location: [facility] OR;  Service: Radiology;  Laterality: N/A;   TEE WITHOUT CARDIOVERSION N/A 08/18/2022   Procedure: TRANSESOPHAGEAL ECHOCARDIOGRAM;  Surgeon: Jon Fus, MD;  Location: [facility] INVASIVE CV LAB;  Service: Cardiovascular;  Laterality: N/A;   Patient Active Problem List   Diagnosis Date Noted   Essential hypertension, benign 08/18/2022   Hyperlipidemia 08/18/2022   DM (diabetes mellitus), type 2 (HCC) 08/18/2022   Obesity (BMI 30-39.9)  08/18/2022   CAD (coronary artery disease) 08/18/2022   OSA (obstructive sleep apnea) 08/18/2022   Acute ischemic left MCA stroke (HCC) 08/18/2022   Acute ischemic right MCA stroke (HCC) 08/14/2022   Hypertensive urgency 04/27/2017   ONSET DATE: 08/14/2022; date of referral 08/23/2022    REFERRING DIAG: W29.562 (ICD-10-CM) - Cerebral infarction due to unspecified occlusion or stenosis of left middle cerebral artery   THERAPY DIAG:  Aphasia   Apraxia   Cerebral infarction due to unspecified occlusion or stenosis of left middle cerebral artery (HCC)   Rationale for Evaluation and Treatment Rehabilitation   SUBJECTIVE:      PERTINENT HISTORY:  [name] is 57 year old male who presented to the [facility] ED on 08/14/2022 noted by wife to be very weak and confused and halfway enter out of the bathtub at home. Symptoms were sudden in onset. Last known well 9 PM. Patient presented with global aphasia. Code stroke activated. CT head negative for hemorrhage, aspects of 9, mild hyperdensity left MCA could indicate thrombus. Tenecteplase administered. CTA showed left M1 occlusion and neurointerventional radiology contacted. Patient transferred to [facility] for thrombectomy. On route he was noted to have periorbital welts and uvular swelling. Was concern of possible angioedema secondary to tenecteplase. He was intubated for airway protection and underwent cerebral angiogram by Dr. Tommie Warner. Recanalization of the left MCA vascular tree noted. Critical  care medicine consulted for management and placed on low-dose Neo-Synephrine infusion. Extubated on 4/23. 2D echo with EF of approximately 60 to 65% with trivial MVR. LDL 96, hemoglobin A1c 6.3%. He was started on heparin subcutaneously for VTE prophylaxis.     DIAGNOSTIC FINDINGS:    CT Head - 08/14/2022 Mild hyperdensity of the left MCA   CT Head and Neck Angio - 08/14/2022 Proximal occlusion of the left MCA M1 segment with  poor collateralization throughout the MCA territory.   IR Angio - 08/15/2022 Interval recanalization of the left M1/MCA occlusion seen on prior CT angiogram likely as a result of TNK administration. Therefore, no intervention performed.   MRI 08/15/2022 Acute cortical infarcts along the left frontal operculum and left anterior insula with additional small area of acute cortical infarct within left parietal lobe. Other areas of faint cortical DWI hyperintensity throughout the left MCA territory, including the left putamen, may reflect additional areas of infarct or ischemia. 2. Loss of the left transverse sinus flow void is favored to reflect slow flow. If clinical suspicion for thrombosis, CT venography could be performed for further characterization.     PAIN:  Are you having pain? No   FALLS: Has patient fallen in last 6 months?  No   LIVING ENVIRONMENT: Lives with: lives with their family Lives in: House/apartment   PLOF:  Level of assistance: Independent with ADLs, Independent with IADLs Employment: Full-time employment     PATIENT GOALS    to be able to communicate again   SUBJECTIVE STATEMENT: Pt continues to be eager, son reports use of app last evening, new entries noted Pt accompanied by: pt's son [name])   OBJECTIVE:   TODAY'S TREATMENT:  Skilled treatment session focused on pt's communication goals. SLP facilitated session by providing the following interventions:    Text to Speech app utilized to aid in word finding and increased speech intelligibility - for orientation information - [name] was _____; Today is _______; Tomorrow is ______; moderate to maximal cues for awareness of semantic/perseverative response of "Wednesday"  Text to speech app utilized to promote generative naming - pt able to list 8 pieces of furniture  Conversation responses increased when discusses current [name], playoffs and upcoming basketball tournament this weekend  PATIENT  EDUCATION: Education details: see above Person educated: Patient and son Education method: Explanation Education comprehension: verbalized understanding and needs further education   HOME EXERCISE PROGRAM:        generative naming task     GOALS:   Goals reviewed with patient? Yes   SHORT TERM GOALS: Target date: 10 sessions   UPDATED GOALS: 09/12/2022 The patient will name common household objects at 80% accuracy given frequent maximum verbal and frequent maximum phonemic cues. Goal status: INITIAL Goal Status: MET for names of family members Goal Updated: With moderate assistance, pt will name common household objects at 80% accuracy.    2.  The patient will identify the correct word given 2 choices at 80% accuracy given frequent maximal visual cues in order to increase ability to comprehend simple instructions. Goal status: INITIAL Goal status: Ongoing   3.  The patient will follow simple body commands presented auditorily at 80% accuracy given frequent maximal visual cues. Goal status: INITIAL Goal status: Ongoing   4.  The patient will say functional words (e.g., water, toilet) at 50% accuracy given frequent maximal phonemic placement cues in order to communicate ability to communicate basic wants and needs. Goal status: INITIAL Goal status: MET Goal  updated: The patient will say functional words (e.g., water, toilet) at 75% accuracy given frequent maximal phonemic placement cues in order to communicate ability to communicate basic wants and needs.     LONG TERM GOALS: Target date: 11/17/2022   Updated: 09/12/2022 Pt will use multimodal means of communication to express basic biographical information about himself with moderate assistance.  Baseline:  Goal status: INITIAL Goal status: ONGOING    2.  Pt will demonstrate > 90% speech intelligibility when reading orientation information.  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 See treatment note for details. Use of Text to Speech app beneficial for improved word finding and speech intelligibility - however during today's session, pt with spontaneous verbal responses that were accurate and intelligible.    OBJECTIVE IMPAIRMENTS include expressive language, receptive language, apraxia, and reading comprehension and ability to produce written language . These impairments are limiting patient from return to work, managing medications, managing appointments, managing finances, household responsibilities, ADLs/IADLs, and effectively communicating at home and in community. Factors affecting potential to achieve goals and functional outcome are severity of impairments. Patient will benefit from skilled SLP services to address above impairments and improve overall function.   REHAB POTENTIAL: Excellent   PLAN: SLP FREQUENCY: 3x/week to 4xweek   SLP DURATION: 12 weeks   PLANNED INTERVENTIONS: Language facilitation, Cueing hierachy, Functional tasks, Multimodal communication approach, SLP instruction and feedback, Compensatory strategies, and Patient/family education       Athina Fahey B. Dreama Saa, M.S., CCC-SLP, Tree surgeon Certified Brain Injury Specialist [facility]  [facility] Rehabilitation Services Office [phone] Ascom [phone] Fax [phone]

## 2022-09-23 NOTE — Therapy (Signed)
OUTPATIENT SPEECH LANGUAGE PATHOLOGY  TREATMENT NOTE    Patient Name: [name] MRN: [id number] DOB:1965/12/23, 57 y.o., male Today's Date: 09/23/2022    PCP: Myrene Buddy, NP REFERRING PROVIDER: Mariam Dollar, PA   End of Session - 09/23/22 1219     Visit Number 17    Number of Visits 49    Date for SLP Re-Evaluation 11/17/22    Authorization Type [employer]    Progress Note Due on Visit 20    SLP Start Time 1105    SLP Stop Time  1150    SLP Time Calculation (min) 45 min    Activity Tolerance Patient tolerated treatment well              Past Medical History:  Diagnosis Date   Hypertension    LVH (left ventricular hypertrophy)    Myocardial infarction (HCC)    Vitamin D deficiency    Past Surgical History:  Procedure Laterality Date   ANTERIOR CRUCIATE LIGAMENT REPAIR     COLONOSCOPY WITH PROPOFOL N/A 04/11/2018   Procedure: COLONOSCOPY WITH PROPOFOL;  Surgeon: Toledo, Boykin Nearing, MD;  Location: [facility] ENDOSCOPY;  Service: Gastroenterology;  Laterality: N/A;   GASTRIC RESECTION     IR ANGIO INTRA EXTRACRAN SEL INTERNAL CAROTID UNI L MOD SED  08/15/2022   IR US GUIDE VASC ACCESS RIGHT  08/15/2022   LAPAROTOMY     LOOP RECORDER INSERTION N/A 08/18/2022   Procedure: LOOP RECORDER INSERTION;  Surgeon: Regan Lemming, MD;  Location: [facility] INVASIVE CV LAB;  Service: Cardiovascular;  Laterality: N/A;   RADIOLOGY WITH ANESTHESIA N/A 08/14/2022   Procedure: IR WITH ANESTHESIA;  Surgeon: Julieanne Cotton, MD;  Location: [facility] OR;  Service: Radiology;  Laterality: N/A;   TEE WITHOUT CARDIOVERSION N/A 08/18/2022   Procedure: TRANSESOPHAGEAL ECHOCARDIOGRAM;  Surgeon: [name], MD;  Location: [facility] INVASIVE CV LAB;  Service: Cardiovascular;  Laterality: N/A;   Patient Active Problem List   Diagnosis Date Noted   Essential hypertension, benign 08/18/2022   Hyperlipidemia 08/18/2022   DM (diabetes mellitus), type 2 (HCC) 08/18/2022   Obesity  (BMI 30-39.9) 08/18/2022   CAD (coronary artery disease) 08/18/2022   OSA (obstructive sleep apnea) 08/18/2022   Acute ischemic left MCA stroke (HCC) 08/18/2022   Acute ischemic right MCA stroke (HCC) 08/14/2022   Hypertensive urgency 04/27/2017   ONSET DATE: 08/14/2022; date of referral 08/23/2022    REFERRING DIAG: W09.811 (ICD-10-CM) - Cerebral infarction due to unspecified occlusion or stenosis of left middle cerebral artery   THERAPY DIAG:  Aphasia   Apraxia   Cerebral infarction due to unspecified occlusion or stenosis of left middle cerebral artery (HCC)   Rationale for Evaluation and Treatment Rehabilitation   SUBJECTIVE:      PERTINENT HISTORY:  [name] is 57 year old male who presented to the [facility] ED on 08/14/2022 noted by wife to be very weak and confused and halfway enter out of the bathtub at home. Symptoms were sudden in onset. Last known well 9 PM. Patient presented with global aphasia. Code stroke activated. CT head negative for hemorrhage, aspects of 9, mild hyperdensity left MCA could indicate thrombus. Tenecteplase administered. CTA showed left M1 occlusion and neurointerventional radiology contacted. Patient transferred to [facility] for thrombectomy. On route he was noted to have periorbital welts and uvular swelling. Was concern of possible angioedema secondary to tenecteplase. He was intubated for airway protection and underwent cerebral angiogram by Dr. Tommie Sams. Recanalization of the  left MCA vascular tree noted. Critical care medicine consulted for management and placed on low-dose Neo-Synephrine infusion. Extubated on 4/23. 2D echo with EF of approximately 60 to 65% with trivial MVR. LDL 96, hemoglobin A1c 6.3%. He was started on heparin subcutaneously for VTE prophylaxis.     DIAGNOSTIC FINDINGS:    CT Head - 08/14/2022 Mild hyperdensity of the left MCA   CT Head and Neck Angio - 08/14/2022 Proximal occlusion of the left MCA M1 segment  with poor collateralization throughout the MCA territory.   IR Angio - 08/15/2022 Interval recanalization of the left M1/MCA occlusion seen on prior CT angiogram likely as a result of TNK administration. Therefore, no intervention performed.   MRI 08/15/2022 Acute cortical infarcts along the left frontal operculum and left anterior insula with additional small area of acute cortical infarct within left parietal lobe. Other areas of faint cortical DWI hyperintensity throughout the left MCA territory, including the left putamen, may reflect additional areas of infarct or ischemia. 2. Loss of the left transverse sinus flow void is favored to reflect slow flow. If clinical suspicion for thrombosis, CT venography could be performed for further characterization.     PAIN:  Are you having pain? No   FALLS: Has patient fallen in last 6 months?  No   LIVING ENVIRONMENT: Lives with: lives with their family Lives in: House/apartment   PLOF:  Level of assistance: Independent with ADLs, Independent with IADLs Employment: Full-time employment     PATIENT GOALS    to be able to communicate again   SUBJECTIVE STATEMENT: Pt continues to be eager, son reports use of app last evening, new entries noted Pt accompanied by: pt's son [name])   OBJECTIVE:   TODAY'S TREATMENT:  Skilled treatment session focused on pt's communication goals. SLP facilitated session by providing the following interventions:    Text to Speech app utilized to aid in word finding and increased speech intelligibility - for orientation information - Burgess Estelle was _____; Today is _______; Tomorrow is ______; moderate to maximal cues for awareness of semantic/perseverative response of "Wednesday"  Text to speech app utilized to promote generative naming - pt able to list 8 pieces of furniture  Conversation responses increased when discusses current [employer], playoffs and upcoming basketball tournament this  weekend  PATIENT EDUCATION: Education details: see above Person educated: Patient and son Education method: Explanation Education comprehension: verbalized understanding and needs further education   HOME EXERCISE PROGRAM:        generative naming task     GOALS:   Goals reviewed with patient? Yes   SHORT TERM GOALS: Target date: 10 sessions   UPDATED GOALS: 09/12/2022 The patient will name common household objects at 80% accuracy given frequent maximum verbal and frequent maximum phonemic cues. Goal status: INITIAL Goal Status: MET for names of family members Goal Updated: With moderate assistance, pt will name common household objects at 80% accuracy.    2.  The patient will identify the correct word given 2 choices at 80% accuracy given frequent maximal visual cues in order to increase ability to comprehend simple instructions. Goal status: INITIAL Goal status: Ongoing   3.  The patient will follow simple body commands presented auditorily at 80% accuracy given frequent maximal visual cues. Goal status: INITIAL Goal status: Ongoing   4.  The patient will say functional words (e.g., water, toilet) at 50% accuracy given frequent maximal phonemic placement cues in order to communicate ability to communicate basic wants and needs. Goal  status: INITIAL Goal status: MET Goal updated: The patient will say functional words (e.g., water, toilet) at 75% accuracy given frequent maximal phonemic placement cues in order to communicate ability to communicate basic wants and needs.     LONG TERM GOALS: Target date: 11/17/2022   Updated: 09/12/2022 Pt will use multimodal means of communication to express basic biographical information about himself with moderate assistance.  Baseline:  Goal status: INITIAL Goal status: ONGOING    2.  Pt will demonstrate > 90% speech intelligibility when reading orientation information.  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 See treatment note for details. Use of Text to Speech app beneficial for improved word finding and speech intelligibility - however during today's session, pt with spontaneous verbal responses that were accurate and intelligible.    OBJECTIVE IMPAIRMENTS include expressive language, receptive language, apraxia, and reading comprehension and ability to produce written language . These impairments are limiting patient from return to work, managing medications, managing appointments, managing finances, household responsibilities, ADLs/IADLs, and effectively communicating at home and in community. Factors affecting potential to achieve goals and functional outcome are severity of impairments. Patient will benefit from skilled SLP services to address above impairments and improve overall function.   REHAB POTENTIAL: Excellent   PLAN: SLP FREQUENCY: 3x/week to 4xweek   SLP DURATION: 12 weeks   PLANNED INTERVENTIONS: Language facilitation, Cueing hierachy, Functional tasks, Multimodal communication approach, SLP instruction and feedback, Compensatory strategies, and Patient/family education       Kiyra Slaubaugh B. Dreama Saa, M.S., CCC-SLP, Tree surgeon Certified Brain Injury Specialist [facility]  [facility] Rehabilitation Services Office [phone] Ascom [phone] Fax [phone]

## 2022-09-26 ENCOUNTER — Ambulatory Visit: Payer: BC Managed Care – PPO | Attending: Physician Assistant | Admitting: Speech Pathology

## 2022-09-26 ENCOUNTER — Ambulatory Visit (INDEPENDENT_AMBULATORY_CARE_PROVIDER_SITE_OTHER): Payer: BC Managed Care – PPO

## 2022-09-26 DIAGNOSIS — R482 Apraxia: Secondary | ICD-10-CM | POA: Diagnosis present

## 2022-09-26 DIAGNOSIS — I63512 Cerebral infarction due to unspecified occlusion or stenosis of left middle cerebral artery: Secondary | ICD-10-CM

## 2022-09-26 DIAGNOSIS — I63511 Cerebral infarction due to unspecified occlusion or stenosis of right middle cerebral artery: Secondary | ICD-10-CM | POA: Diagnosis not present

## 2022-09-26 DIAGNOSIS — R4701 Aphasia: Secondary | ICD-10-CM

## 2022-09-26 LAB — CUP PACEART REMOTE DEVICE CHECK
Date Time Interrogation Session: 20240602230407
Implantable Pulse Generator Implant Date: 20240425

## 2022-09-26 NOTE — Therapy (Unsigned)
OUTPATIENT SPEECH LANGUAGE PATHOLOGY  TREATMENT NOTE    Patient Name: [name] MRN: [id number] DOB:1965/11/22, 57 y.o., male Today's Date: 09/26/2022    PCP: Jon Buddy, NP REFERRING PROVIDER: Mariam Dollar, PA   End of Session - 09/26/22 0925     Visit Number 18    Number of Visits 49    Date for SLP Re-Evaluation 11/17/22    Authorization Type [employer]    Progress Note Due on Visit 20    SLP Start Time 0900    SLP Stop Time  1000    SLP Time Calculation (min) 60 min    Activity Tolerance Patient tolerated treatment well              Past Medical History:  Diagnosis Date   Hypertension    LVH (left ventricular hypertrophy)    Myocardial infarction (HCC)    Vitamin D deficiency    Past Surgical History:  Procedure Laterality Date   ANTERIOR CRUCIATE LIGAMENT REPAIR     COLONOSCOPY WITH PROPOFOL N/A 04/11/2018   Procedure: COLONOSCOPY WITH PROPOFOL;  Surgeon: Toledo, Boykin Nearing, MD;  Location: [facility] ENDOSCOPY;  Service: Gastroenterology;  Laterality: N/A;   GASTRIC RESECTION     IR ANGIO INTRA EXTRACRAN SEL INTERNAL CAROTID UNI L MOD SED  08/15/2022   IR US GUIDE VASC ACCESS RIGHT  08/15/2022   LAPAROTOMY     LOOP RECORDER INSERTION N/A 08/18/2022   Procedure: LOOP RECORDER INSERTION;  Surgeon: Jon Lemming, MD;  Location: [facility] INVASIVE CV LAB;  Service: Cardiovascular;  Laterality: N/A;   RADIOLOGY WITH ANESTHESIA N/A 08/14/2022   Procedure: IR WITH ANESTHESIA;  Surgeon: Jon Cotton, MD;  Location: [facility] OR;  Service: Radiology;  Laterality: N/A;   TEE WITHOUT CARDIOVERSION N/A 08/18/2022   Procedure: TRANSESOPHAGEAL ECHOCARDIOGRAM;  Surgeon: Jon Fus, MD;  Location: [facility]VASIVE CV LAB;  Service: Cardiovascular;  Laterality: N/A;   Patient Active Problem List   Diagnosis Date Noted   Essential hypertension, benign 08/18/2022   Hyperlipidemia 08/18/2022   DM (diabetes mellitus), type 2 (HCC) 08/18/2022   Obesity  (BMI 30-39.9) 08/18/2022   CAD (coronary artery disease) 08/18/2022   OSA (obstructive sleep apnea) 08/18/2022   Acute ischemic left MCA stroke (HCC) 08/18/2022   Acute ischemic right MCA stroke (HCC) 08/14/2022   Hypertensive urgency 04/27/2017   ONSET DATE: 08/14/2022; date of referral 08/23/2022    REFERRING DIAG: W29.562 (ICD-10-CM) - Cerebral infarction due to unspecified occlusion or stenosis of left middle cerebral artery   THERAPY DIAG:  Aphasia   Apraxia   Cerebral infarction due to unspecified occlusion or stenosis of left middle cerebral artery (HCC)   Rationale for Evaluation and Treatment Rehabilitation   SUBJECTIVE:      PERTINENT HISTORY:  [name] is 57 year old male who presented to the [facility] ED on 08/14/2022 noted by wife to be very weak and confused and halfway enter out of the bathtub at home. Symptoms were sudden in onset. Last known well 9 PM. Patient presented with global aphasia. Code stroke activated. CT head negative for hemorrhage, aspects of 9, mild hyperdensity left MCA could indicate thrombus. Tenecteplase administered. CTA showed left M1 occlusion and neurointerventional radiology contacted. Patient [facility] for thromb[facility]. On route he was noted to have periorbital welts and uvular swelling. Was concern of possible angioedema secondary to tenecteplase. He was intubated for airway protection and underwent cerebral angiogram by Dr. Tommie Warner. Recanalization of the  left MCA vascular tree noted. Critical care medicine consulted for management and placed on low-dose Neo-Synephrine infusion. Extubated on 4/23. 2D echo with EF of approximately 60 to 65% with trivial MVR. LDL 96, hemoglobin A1c 6.3%. He was started on heparin subcutaneously for VTE prophylaxis.     DIAGNOSTIC FINDINGS:    CT Head - 08/14/2022 Mild hyperdensity of the left MCA   CT Head and Neck Angio - 08/14/2022 Proximal occlusion of the left MCA M1 segment  with poor collateralization throughout the MCA territory.   IR Angio - 08/15/2022 Interval recanalization of the left M1/MCA occlusion seen on prior CT angiogram likely as a result of TNK administration. Therefore, no intervention performed.   MRI 08/15/2022 Acute cortical infarcts along the left frontal operculum and left anterior insula with additional small area of acute cortical infarct within left parietal lobe. Other areas of faint cortical DWI hyperintensity throughout the left MCA territory, including the left putamen, may reflect additional areas of infarct or ischemia. 2. Loss of the left transverse sinus flow void is favored to reflect slow flow. If clinical suspicion for thrombosis, CT venography could be performed for further characterization.     PAIN:  Are you having pain? No   FALLS: Has patient fallen in last 6 months?  No   LIVING ENVIRONMENT: Lives with: lives with their family Lives in: House/apartment   PLOF:  Level of assistance: Independent with ADLs, Independent with IADLs Employment: Full-time employment     PATIENT GOALS    to be able to communicate again   SUBJECTIVE STATEMENT: Pt continues to be eager, pt's youngest son attended session with pt and son [name]) Pt accompanied by: pt's son [name])   OBJECTIVE:   TODAY'S TREATMENT:  Skilled treatment session focused on pt's communication goals. SLP facilitated session by providing the following interventions:  Text to Speech app used to increase speech intelligibility/naming during tasks (except where noted).  immediate family member names: - all identified currently in app with correct imitation; when app faded with pt increased semantic paraphasias as well as rote name "[name]) Conversational questions - moderate verbal and written cues required for comprehension of basic questions, pt with semantically related answers but incorrect demonstrating decreased receptive language abilities. Pt with  appropriate spelling and verbalization with verbal model.  Generative naming - minimal assistance to list items using written language   PATIENT EDUCATION: Education details: see above Person educated: Patient and son Education method: Explanation Education comprehension: verbalized understanding and needs further education   HOME EXERCISE PROGRAM:        generative naming task     GOALS:   Goals reviewed with patient? Yes   SHORT TERM GOALS: Target date: 10 sessions   UPDATED GOALS: 09/12/2022 The patient will name common household objects at 80% accuracy given frequent maximum verbal and frequent maximum phonemic cues. Goal status: INITIAL Goal Status: MET for names of family members Goal Updated: With moderate assistance, pt will name common household objects at 80% accuracy.    2.  The patient will identify the correct word given 2 choices at 80% accuracy given frequent maximal visual cues in order to increase ability to comprehend simple instructions. Goal status: INITIAL Goal status: Ongoing   3.  The patient will follow simple body commands presented auditorily at 80% accuracy given frequent maximal visual cues. Goal status: INITIAL Goal status: Ongoing   4.  The patient will say functional words (e.g., water, toilet) at 50% accuracy given frequent maximal phonemic  placement cues in order to communicate ability to communicate basic wants and needs. Goal status: INITIAL Goal status: MET Goal updated: The patient will say functional words (e.g., water, toilet) at 75% accuracy given frequent maximal phonemic placement cues in order to communicate ability to communicate basic wants and needs.     LONG TERM GOALS: Target date: 11/17/2022   Updated: 09/12/2022 Pt will use multimodal means of communication to express basic biographical information about himself with moderate assistance.  Baseline:  Goal status: INITIAL Goal status: ONGOING    2.  Pt will demonstrate >  90% speech intelligibility when reading orientation information.  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 See treatment note for details. Use of Text to Speech app beneficial for improved word finding and speech intelligibility - however during today's session, pt with spontaneous verbal responses that were accurate and intelligible.    OBJECTIVE IMPAIRMENTS include expressive language, receptive language, apraxia, and reading comprehension and ability to produce written language . These impairments are limiting patient from return to work, managing medications, managing appointments, managing finances, household responsibilities, ADLs/IADLs, and effectively communicating at home and in community. Factors affecting potential to achieve goals and functional outcome are severity of impairments. Patient will benefit from skilled SLP services to address above impairments and improve overall function.   REHAB POTENTIAL: Excellent   PLAN: SLP FREQUENCY: 3x/week to 4xweek   SLP DURATION: 12 weeks   PLANNED INTERVENTIONS: Language facilitation, Cueing hierachy, Functional tasks, Multimodal communication approach, SLP instruction and feedback, Compensatory strategies, and Patient/family education       Awad Gladd B. Dreama Saa, M.S., CCC-SLP, Tree surgeon Certified Brain Injury Specialist [facility]  [facility] Services Office [phone] Ascom [phone] Fax [phone]

## 2022-09-27 ENCOUNTER — Ambulatory Visit: Payer: BC Managed Care – PPO | Admitting: Speech Pathology

## 2022-09-27 DIAGNOSIS — R4701 Aphasia: Secondary | ICD-10-CM

## 2022-09-27 DIAGNOSIS — R482 Apraxia: Secondary | ICD-10-CM

## 2022-09-27 DIAGNOSIS — I63512 Cerebral infarction due to unspecified occlusion or stenosis of left middle cerebral artery: Secondary | ICD-10-CM

## 2022-09-27 NOTE — Therapy (Signed)
OUTPATIENT SPEECH LANGUAGE PATHOLOGY  TREATMENT NOTE    Patient Name: [name] MRN: [id number] DOB:03/31/1966, 57 y.o., male Today's Date: 09/27/2022    PCP: Myrene Buddy, NP REFERRING PROVIDER: Mariam Dollar, PA   End of Session - 09/27/22 1354     Visit Number 19    Number of Visits 49    Date for SLP Re-Evaluation 11/17/22    Authorization Type [employer]    Progress Note Due on Visit 20    SLP Start Time 1300    SLP Stop Time  1400    SLP Time Calculation (min) 60 min    Activity Tolerance Patient tolerated treatment well              Past Medical History:  Diagnosis Date   Hypertension    LVH (left ventricular hypertrophy)    Myocardial infarction (HCC)    Vitamin D deficiency    Past Surgical History:  Procedure Laterality Date   ANTERIOR CRUCIATE LIGAMENT REPAIR     COLONOSCOPY WITH PROPOFOL N/A 04/11/2018   Procedure: COLONOSCOPY WITH PROPOFOL;  Surgeon: Toledo, Boykin Nearing, MD;  Location: [facility] ENDOSCOPY;  Service: Gastroenterology;  Laterality: N/A;   GASTRIC RESECTION     IR ANGIO INTRA EXTRACRAN SEL INTERNAL CAROTID UNI L MOD SED  08/15/2022   IR US GUIDE VASC ACCESS RIGHT  08/15/2022   LAPAROTOMY     LOOP RECORDER INSERTION N/A 08/18/2022   Procedure: LOOP RECORDER INSERTION;  Surgeon: Regan Lemming, MD;  Location: [facility] INVASIVE CV LAB;  Service: Cardiovascular;  Laterality: N/A;   RADIOLOGY WITH ANESTHESIA N/A 08/14/2022   Procedure: IR WITH ANESTHESIA;  Surgeon: Julieanne Cotton, MD;  Location: [facility] OR;  Service: Radiology;  Laterality: N/A;   TEE WITHOUT CARDIOVERSION N/A 08/18/2022   Procedure: TRANSESOPHAGEAL ECHOCARDIOGRAM;  Surgeon: [name], MD;  Location: [facility] INVASIVE CV LAB;  Service: Cardiovascular;  Laterality: N/A;   Patient Active Problem List   Diagnosis Date Noted   Essential hypertension, benign 08/18/2022   Hyperlipidemia 08/18/2022   DM (diabetes mellitus), type 2 (HCC) 08/18/2022   Obesity  (BMI 30-39.9) 08/18/2022   CAD (coronary artery disease) 08/18/2022   OSA (obstructive sleep apnea) 08/18/2022   Acute ischemic left MCA stroke (HCC) 08/18/2022   Acute ischemic right MCA stroke (HCC) 08/14/2022   Hypertensive urgency 04/27/2017   ONSET DATE: 08/14/2022; date of referral 08/23/2022    REFERRING DIAG: N56.213 (ICD-10-CM) - Cerebral infarction due to unspecified occlusion or stenosis of left middle cerebral artery   THERAPY DIAG:  Aphasia   Apraxia   Cerebral infarction due to unspecified occlusion or stenosis of left middle cerebral artery (HCC)   Rationale for Evaluation and Treatment Rehabilitation   SUBJECTIVE:      PERTINENT HISTORY:  Cori Waisner is 57 year old male who presented to the [facility] ED on 08/14/2022 noted by wife to be very weak and confused and halfway enter out of the bathtub at home. Symptoms were sudden in onset. Last known well 9 PM. Patient presented with global aphasia. Code stroke activated. CT head negative for hemorrhage, aspects of 9, mild hyperdensity left MCA could indicate thrombus. Tenecteplase administered. CTA showed left M1 occlusion and neurointerventional radiology contacted. Patient transferred to [facility] for thrombectomy. On route he was noted to have periorbital welts and uvular swelling. Was concern of possible angioedema secondary to tenecteplase. He was intubated for airway protection and underwent cerebral angiogram by Dr. Tommie Sams. Recanalization of the  left MCA vascular tree noted. Critical care medicine consulted for management and placed on low-dose Neo-Synephrine infusion. Extubated on 4/23. 2D echo with EF of approximately 60 to 65% with trivial MVR. LDL 96, hemoglobin A1c 6.3%. He was started on heparin subcutaneously for VTE prophylaxis.     DIAGNOSTIC FINDINGS:    CT Head - 08/14/2022 Mild hyperdensity of the left MCA   CT Head and Neck Angio - 08/14/2022 Proximal occlusion of the left MCA M1 segment  with poor collateralization throughout the MCA territory.   IR Angio - 08/15/2022 Interval recanalization of the left M1/MCA occlusion seen on prior CT angiogram likely as a result of TNK administration. Therefore, no intervention performed.   MRI 08/15/2022 Acute cortical infarcts along the left frontal operculum and left anterior insula with additional small area of acute cortical infarct within left parietal lobe. Other areas of faint cortical DWI hyperintensity throughout the left MCA territory, including the left putamen, may reflect additional areas of infarct or ischemia. 2. Loss of the left transverse sinus flow void is favored to reflect slow flow. If clinical suspicion for thrombosis, CT venography could be performed for further characterization.     PAIN:  Are you having pain? No   FALLS: Has patient fallen in last 6 months?  No   LIVING ENVIRONMENT: Lives with: lives with their family Lives in: House/apartment   PLOF:  Level of assistance: Independent with ADLs, Independent with IADLs Employment: Full-time employment     PATIENT GOALS    to be able to communicate again   SUBJECTIVE STATEMENT: Pt continues to be eager, he brought in homework pt's youngest son attended session with pt and son [name]) Pt accompanied by: pt's son [name])   OBJECTIVE:   TODAY'S TREATMENT:  Skilled treatment session focused on pt's communication goals. SLP facilitated session by providing the following interventions:  Text to Speech app used to increase speech intelligibility/naming during tasks (except where noted).  immediate family member names: - all identified currently in app with correct imitation; when app faded with pt increased semantic paraphasias as well as rote name "[name] Conversational questions - moderate verbal and written cues required for comprehension of basic questions, pt with semantically related answers but incorrect demonstrating decreased receptive  language abilities. Pt with appropriate spelling and verbalization with verbal model.  Generative naming - minimal assistance to list items using written language   PATIENT EDUCATION: Education details: see above Person educated: Patient and son Education method: Explanation Education comprehension: verbalized understanding and needs further education   HOME EXERCISE PROGRAM:        practice generating opposites     GOALS:   Goals reviewed with patient? Yes   SHORT TERM GOALS: Target date: 10 sessions   UPDATED GOALS: 09/12/2022 The patient will name common household objects at 80% accuracy given frequent maximum verbal and frequent maximum phonemic cues. Goal status: INITIAL Goal Status: MET for names of family members Goal Updated: With moderate assistance, pt will name common household objects at 80% accuracy.    2.  The patient will identify the correct word given 2 choices at 80% accuracy given frequent maximal visual cues in order to increase ability to comprehend simple instructions. Goal status: INITIAL Goal status: Ongoing   3.  The patient will follow simple body commands presented auditorily at 80% accuracy given frequent maximal visual cues. Goal status: INITIAL Goal status: Ongoing   4.  The patient will say functional words (e.g., water, toilet) at 50% accuracy  given frequent maximal phonemic placement cues in order to communicate ability to communicate basic wants and needs. Goal status: INITIAL Goal status: MET Goal updated: The patient will say functional words (e.g., water, toilet) at 75% accuracy given frequent maximal phonemic placement cues in order to communicate ability to communicate basic wants and needs.     LONG TERM GOALS: Target date: 11/17/2022   Updated: 09/12/2022 Pt will use multimodal means of communication to express basic biographical information about himself with moderate assistance.  Baseline:  Goal status: INITIAL Goal status:  ONGOING    2.  Pt will demonstrate > 90% speech intelligibility when reading orientation information.  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 See treatment note for details. Use of Text to Speech app beneficial for improved word finding and speech intelligibility - however during today's session, pt with spontaneous verbal responses that were accurate and intelligible.    OBJECTIVE IMPAIRMENTS include expressive language, receptive language, apraxia, and reading comprehension and ability to produce written language . These impairments are limiting patient from return to work, managing medications, managing appointments, managing finances, household responsibilities, ADLs/IADLs, and effectively communicating at home and in community. Factors affecting potential to achieve goals and functional outcome are severity of impairments. Patient will benefit from skilled SLP services to address above impairments and improve overall function.   REHAB POTENTIAL: Excellent   PLAN: SLP FREQUENCY: 3x/week to 4xweek   SLP DURATION: 12 weeks   PLANNED INTERVENTIONS: Language facilitation, Cueing hierachy, Functional tasks, Multimodal communication approach, SLP instruction and feedback, Compensatory strategies, and Patient/family education       Ugo Thoma B. Dreama Saa, M.S., CCC-SLP, Tree surgeon Certified Brain Injury Specialist [facility]  [facility] Rehabilitation Services Office [phone] Ascom [phone] Fax [phone]

## 2022-09-28 ENCOUNTER — Ambulatory Visit: Payer: BC Managed Care – PPO | Admitting: Speech Pathology

## 2022-09-29 ENCOUNTER — Ambulatory Visit: Payer: BC Managed Care – PPO | Admitting: Speech Pathology

## 2022-09-29 DIAGNOSIS — R4701 Aphasia: Secondary | ICD-10-CM

## 2022-09-29 DIAGNOSIS — R482 Apraxia: Secondary | ICD-10-CM

## 2022-09-29 DIAGNOSIS — I63512 Cerebral infarction due to unspecified occlusion or stenosis of left middle cerebral artery: Secondary | ICD-10-CM

## 2022-09-30 ENCOUNTER — Encounter
Payer: BC Managed Care – PPO | Attending: Physical Medicine & Rehabilitation | Admitting: Physical Medicine & Rehabilitation

## 2022-09-30 ENCOUNTER — Ambulatory Visit: Payer: BC Managed Care – PPO | Admitting: Speech Pathology

## 2022-09-30 ENCOUNTER — Encounter: Payer: Self-pay | Admitting: Physical Medicine & Rehabilitation

## 2022-09-30 VITALS — BP 178/128 | HR 81 | Ht 72.0 in | Wt 201.0 lb

## 2022-09-30 DIAGNOSIS — R4701 Aphasia: Secondary | ICD-10-CM

## 2022-09-30 DIAGNOSIS — I69320 Aphasia following cerebral infarction: Secondary | ICD-10-CM | POA: Diagnosis present

## 2022-09-30 DIAGNOSIS — I10 Essential (primary) hypertension: Secondary | ICD-10-CM

## 2022-09-30 DIAGNOSIS — I63512 Cerebral infarction due to unspecified occlusion or stenosis of left middle cerebral artery: Secondary | ICD-10-CM

## 2022-09-30 DIAGNOSIS — R482 Apraxia: Secondary | ICD-10-CM

## 2022-09-30 NOTE — Therapy (Signed)
OUTPATIENT SPEECH LANGUAGE PATHOLOGY  TREATMENT NOTE     Patient Name: [name] MRN: [id number] DOB:01/22/1966, 57 y.o., male Today's Date: 09/30/2022    PCP: Myrene Buddy, NP REFERRING PROVIDER: Mariam Dollar, PA   End of Session - 09/30/22 1350     Visit Number 21    Number of Visits 49    Date for SLP Re-Evaluation 11/17/22    Authorization Type [employer]    Progress Note Due on Visit 30    SLP Start Time 1100    SLP Stop Time  1145    SLP Time Calculation (min) 45 min    Activity Tolerance Patient tolerated treatment well              Past Medical History:  Diagnosis Date   Hypertension    LVH (left ventricular hypertrophy)    Myocardial infarction (HCC)    Vitamin D deficiency    Past Surgical History:  Procedure Laterality Date   ANTERIOR CRUCIATE LIGAMENT REPAIR     COLONOSCOPY WITH PROPOFOL N/A 04/11/2018   Procedure: COLONOSCOPY WITH PROPOFOL;  Surgeon: Toledo, Boykin Nearing, MD;  Location: [facility] ENDOSCOPY;  Service: Gastroenterology;  Laterality: N/A;   GASTRIC RESECTION     IR ANGIO INTRA EXTRACRAN SEL INTERNAL CAROTID UNI L MOD SED  08/15/2022   IR US GUIDE VASC ACCESS RIGHT  08/15/2022   LAPAROTOMY     LOOP RECORDER INSERTION N/A 08/18/2022   Procedure: LOOP RECORDER INSERTION;  Surgeon: Regan Lemming, MD;  Location: [facility] INVASIVE CV LAB;  Service: Cardiovascular;  Laterality: N/A;   RADIOLOGY WITH ANESTHESIA N/A 08/14/2022   Procedure: IR WITH ANESTHESIA;  Surgeon: Julieanne Cotton, MD;  Location: [facility] OR;  Service: Radiology;  Laterality: N/A;   TEE WITHOUT CARDIOVERSION N/A 08/18/2022   Procedure: TRANSESOPHAGEAL ECHOCARDIOGRAM;  Surgeon: [name], MD;  Location: [facility] INVASIVE CV LAB;  Service: Cardiovascular;  Laterality: N/A;   Patient Active Problem List   Diagnosis Date Noted   Essential hypertension, benign 08/18/2022   Hyperlipidemia 08/18/2022   DM (diabetes mellitus), type 2 (HCC) 08/18/2022   Obesity  (BMI 30-39.9) 08/18/2022   CAD (coronary artery disease) 08/18/2022   OSA (obstructive sleep apnea) 08/18/2022   Acute ischemic left MCA stroke (HCC) 08/18/2022   Acute ischemic right MCA stroke (HCC) 08/14/2022   Hypertensive urgency 04/27/2017   ONSET DATE: 08/14/2022; date of referral 08/23/2022    REFERRING DIAG: J19.147 (ICD-10-CM) - Cerebral infarction due to unspecified occlusion or stenosis of left middle cerebral artery   THERAPY DIAG:  Aphasia   Apraxia   Cerebral infarction due to unspecified occlusion or stenosis of left middle cerebral artery (HCC)   Rationale for Evaluation and Treatment Rehabilitation   SUBJECTIVE:      PERTINENT HISTORY:  [name] is 57 year old male who presented to the [facility] ED on 08/14/2022 noted by wife to be very weak and confused and halfway enter out of the bathtub at home. Symptoms were sudden in onset. Last known well 9 PM. Patient presented with global aphasia. Code stroke activated. CT head negative for hemorrhage, aspects of 9, mild hyperdensity left MCA could indicate thrombus. Tenecteplase administered. CTA showed left M1 occlusion and neurointerventional radiology contacted. Patient transferred to [facility] for thrombectomy. On route he was noted to have periorbital welts and uvular swelling. Was concern of possible angioedema secondary to tenecteplase. He was intubated for airway protection and underwent cerebral angiogram by Dr. Tommie Sams. Recanalization of  the left MCA vascular tree noted. Critical care medicine consulted for management and placed on low-dose Neo-Synephrine infusion. Extubated on 4/23. 2D echo with EF of approximately 60 to 65% with trivial MVR. LDL 96, hemoglobin A1c 6.3%. He was started on heparin subcutaneously for VTE prophylaxis.     DIAGNOSTIC FINDINGS:    CT Head - 08/14/2022 Mild hyperdensity of the left MCA   CT Head and Neck Angio - 08/14/2022 Proximal occlusion of the left MCA M1 segment  with poor collateralization throughout the MCA territory.   IR Angio - 08/15/2022 Interval recanalization of the left M1/MCA occlusion seen on prior CT angiogram likely as a result of TNK administration. Therefore, no intervention performed.   MRI 08/15/2022 Acute cortical infarcts along the left frontal operculum and left anterior insula with additional small area of acute cortical infarct within left parietal lobe. Other areas of faint cortical DWI hyperintensity throughout the left MCA territory, including the left putamen, may reflect additional areas of infarct or ischemia. 2. Loss of the left transverse sinus flow void is favored to reflect slow flow. If clinical suspicion for thrombosis, CT venography could be performed for further characterization.     PAIN:  Are you having pain? No   FALLS: Has patient fallen in last 6 months?  No   LIVING ENVIRONMENT: Lives with: lives with their family Lives in: House/apartment   PLOF:  Level of assistance: Independent with ADLs, Independent with IADLs Employment: Full-time employment     PATIENT GOALS    to be able to communicate again   SUBJECTIVE STATEMENT: Pt continues to be eager, pt's youngest son attended session with pt and son [name]) Pt accompanied by: pt's son [name])   OBJECTIVE:   TODAY'S TREATMENT:  Skilled treatment session focused on pt's communication goals. SLP facilitated session by providing the following interventions:  Talk[employer] Therapy app utilized during today's session WORD ID (targeting receptive language) - level 1 - 19 out of 20; level 2 - 14 out of 20; improving to 20 out of 20 with repetition of the stimulus Sentence Completion: Level 1 14 out of 20 improving to 20 out of 20 with verbal cues Describe the Picture - Level 2 - 19 out of 20 Sentence Scramble - Level 1 - 100% Category - level 1 - 19 out of 20    PATIENT EDUCATION: Education details: see above Person educated: Patient and  son Education method: Explanation Education comprehension: verbalized understanding and needs further education   HOME EXERCISE PROGRAM:        generative naming task     GOALS:   Goals reviewed with patient? Yes   SHORT TERM GOALS: Target date: 10 sessions   UPDATED GOALS: 09/12/2022 The patient will name common household objects at 80% accuracy given frequent maximum verbal and frequent maximum phonemic cues. Goal status: INITIAL Goal Status: MET for names of family members Goal Updated: With moderate assistance, pt will name common household objects at 80% accuracy.    2.  The patient will identify the correct word given 2 choices at 80% accuracy given frequent maximal visual cues in order to increase ability to comprehend simple instructions. Goal status: INITIAL Goal status: Ongoing   3.  The patient will follow simple body commands presented auditorily at 80% accuracy given frequent maximal visual cues. Goal status: INITIAL Goal status: Ongoing   4.  The patient will say functional words (e.g., water, toilet) at 50% accuracy given frequent maximal phonemic placement cues in order to  communicate ability to communicate basic wants and needs. Goal status: INITIAL Goal status: MET Goal updated: The patient will say functional words (e.g., water, toilet) at 75% accuracy given frequent maximal phonemic placement cues in order to communicate ability to communicate basic wants and needs.     LONG TERM GOALS: Target date: 11/17/2022   Updated: 09/12/2022 Pt will use multimodal means of communication to express basic biographical information about himself with moderate assistance.  Baseline:  Goal status: INITIAL Goal status: ONGOING    2.  Pt will demonstrate > 90% speech intelligibility when reading orientation information.  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 See treatment note for details. Pt with HUGE INCREASE  in ability since time of evaluation as pt was not able to complete any of the above tasks at that time.    OBJECTIVE IMPAIRMENTS include expressive language, receptive language, apraxia, and reading comprehension and ability to produce written language . These impairments are limiting patient from return to work, managing medications, managing appointments, managing finances, household responsibilities, ADLs/IADLs, and effectively communicating at home and in community. Factors affecting potential to achieve goals and functional outcome are severity of impairments. Patient will benefit from skilled SLP services to address above impairments and improve overall function.   REHAB POTENTIAL: Excellent   PLAN: SLP FREQUENCY: 3x/week to 4xweek   SLP DURATION: 12 weeks   PLANNED INTERVENTIONS: Language facilitation, Cueing hierachy, Functional tasks, Multimodal communication approach, SLP instruction and feedback, Compensatory strategies, and Patient/family education       Ralf Konopka B. Dreama Saa, M.S., CCC-SLP, Tree surgeon Certified Brain Injury Specialist [facility]  [facility] Office [phone] Ascom [phone] Fax [phone]

## 2022-09-30 NOTE — Progress Notes (Signed)
Subjective:    Patient ID: [name], male    DOB: November 08, 1965, 57 y.o.   MRN: [id number] admitted to rehab 08/18/2022 for inpatient therapies to consist of PT, ST and OT at least three hours five days a week. Past admission physiatrist, therapy team and rehab RN have worked together to provide customized collaborative inpatient rehab. Labs remained stable on 4/29 with serum creatinine now 3.22, BUN 49. Hemoglobin improved. CBG continued BID due to A1c of prediabetes. Receptive aphasia plus severe speech apraxia. Communication board attempted but due to impairment not able to be utilized. Mod I in room on 4/29.    Blood pressures were monitored on TID basis and carvedilol 12.5 mg BID continued. Lisinopril placed on allergy list secondary to angioedema with tenecteplase.   Diabetes has been monitored with ac/hs CBG checks and SSI was use prn for tighter BS control. Switched to CBGs BID on 4/27. No other home anti-hypertensives restarted.    57 y.o. male who presented to the [facility] ED on 08/14/2022 noted by wife to be very weak and confused and halfway enter out of the bathtub at home. Symptoms were sudden in onset. Last known well 9 PM. Patient presented with global aphasia. Code stroke activated. CT head negative for hemorrhage, aspects of 9, mild hyperdensity left MCA could indicate thrombus. Tenecteplase administered. CTA showed left M1 occlusion and neurointerventional radiology contacted. Patient transferred to [facility] for thrombectomy. On route he was noted to have periorbital welts and uvular swelling. Was concern of possible angioedema secondary to tenecteplase. He was intubated for airway protection and underwent cerebral angiogram by Dr. Tommie Sams. Recanalization of the left MCA vascular tree noted. Critical care medicine consulted for management and placed on low-dose Neo-Synephrine infusion. Extubated on 4/23. 2D echo with EF of approximately 60 to 65% with trivial MVR. LDL  96, hemoglobin A1c 6.3%. He was started on heparin subcutaneously for VTE prophylaxis. The patient was maintained on aspirin 81 mg daily prior to admission and was started on Plavix and aspirin restarted. Recommend aspirin and Plavix for 3 weeks then Plavix alone. Cleared for oral diet and therapy evaluations carried out. He underwent TCD with bubble study on 4/24. Bilateral lower extremity venous duplex negative for DVT. His transcranial Doppler was significant for Spencer grade 5 patent foramen ovale. EP consult obtained on 4/25 and TEE performed. Loop recrder placedMod I bed mobility, supervision transfers, and min guard assist amb 300' without AD. Pt continues to demo R drift during gait with decreased awareness of obstacles on R. Pt following simple commands 60% of trials.    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  He suffered left MCA stroke and has had very good recovery of his physical function but has residual moderately severe aphasia.  He is undergoing speech therapy through outpatient at [facility].  He lives with his wife and his 2 sons.  One of his sons is wanting to go back to work.  The wife does work during the day his other son is 55 years old and will be home for the summer. According the wife the patient has been driving to the gym a couple times a week.  She has supervised him driving and feels like he is safe with good reaction time. We discussed his blood pressure which was severely elevated this afternoon.  His blood pressure at home this morning 138/93.  His blood  pressure meds have been adjusted upward by his PCP last week.  He does have a history of whitecoat hypertension according to wife   Pain Inventory Average Pain  No pain Pain Right Now  no pain My pain is  no pain  LOCATION OF PAIN  no pain  BOWEL Number of stools per week:  7  BLADDER Normal    Mobility walk without assistance how many minutes can you walk? No problem walking ability to climb steps?  yes do you drive?  no Do you have any goals in this area?  yes  Function what is your job? On FMLA from [employer] Do you have any goals in this area?  yes  Neuro/Psych No problems in this area  Prior Studies Any changes since last visit?  no  Physicians involved in your care Any changes since last visit?  no   Family History  Problem Relation Age of Onset   Diabetes Mellitus II Mother    Diabetes Mellitus II Father    Social History   Socioeconomic History   Marital status: Married    Spouse name: Not on file   Number of children: Not on file   Years of education: Not on file   Highest education level: Not on file  Occupational History   Not on file  Tobacco Use   Smoking status: Unknown   Smokeless tobacco: Never  Vaping Use   Vaping Use: Never used  Substance and Sexual Activity   Alcohol use: Not Currently   Drug use: Not Currently   Sexual activity: Not on file  Other Topics Concern   Not on file  Social History Narrative   ** Merged History Encounter **       Social Determinants of Health   Financial Resource Strain: Not on file  Food Insecurity: No Food Insecurity (08/17/2022)   Hunger Vital Sign    Worried About Running Out of Food in the Last Year: Never true    Ran Out of Food in the Last Year: Never true  Transportation Needs: No Transportation Needs (08/17/2022)   PRAPARE - Administrator, Civil Service (Medical): No    Lack of Transportation (Non-Medical): No  Physical Activity: Not on file  Stress: Not on file  Social Connections: Not on file   Past Surgical History:  Procedure Laterality Date   ANTERIOR CRUCIATE LIGAMENT REPAIR     COLONOSCOPY WITH PROPOFOL N/A 04/11/2018   Procedure: COLONOSCOPY WITH PROPOFOL;  Surgeon: Toledo, Boykin Nearing, MD;  Location: [facility] ENDOSCOPY;   Service: Gastroenterology;  Laterality: N/A;   GASTRIC RESECTION     IR ANGIO INTRA EXTRACRAN SEL INTERNAL CAROTID UNI L MOD SED  08/15/2022   IR US GUIDE VASC ACCESS RIGHT  08/15/2022   LAPAROTOMY     LOOP RECORDER INSERTION N/A 08/18/2022   Procedure: LOOP RECORDER INSERTION;  Surgeon: Regan Lemming, MD;  Location: [facility] INVASIVE CV LAB;  Service: Cardiovascular;  Laterality: N/A;   RADIOLOGY WITH ANESTHESIA N/A 08/14/2022   Procedure: IR WITH ANESTHESIA;  Surgeon: Julieanne Cotton, MD;  Location: [facility] OR;  Service: Radiology;  Laterality: N/A;   TEE WITHOUT CARDIOVERSION N/A 08/18/2022   Procedure: TRANSESOPHAGEAL ECHOCARDIOGRAM;  Surgeon: [name], MD;  Location: [facility] INVASIVE CV LAB;  Service: Cardiovascular;  Laterality: N/A;   Past Medical History:  Diagnosis Date   Hypertension    LVH (left ventricular hypertrophy)    Myocardial infarction (HCC)    Vitamin D deficiency  Ht 6' (1.829 m)   Wt 201 lb (91.2 kg)   BMI 27.26 kg/m   Opioid Risk Score:   Fall Risk Score:  `1  Depression screen [facility] 2/9     09/30/2022   12:36 PM  Depression screen PHQ 2/9  Decreased Interest 0  Down, Depressed, Hopeless 0  PHQ - 2 Score 0  Altered sleeping 0  Tired, decreased energy 0  Change in appetite 0  Feeling bad or failure about yourself  0  Trouble concentrating 0  Moving slowly or fidgety/restless 0  Suicidal thoughts 0  PHQ-9 Score 0    Review of Systems  Neurological:  Positive for speech difficulty.  Psychiatric/Behavioral:  Negative for decreased concentration.   All other systems reviewed and are negative.     Objective:   Physical Exam Eyes:     General: No visual field deficit. Musculoskeletal:     Comments: No pain or limitation with upper extremity or lower extremity range of motion.  Neurological:     Mental Status: He is alert.     Coordination: Coordination normal. Rapid alternating movements normal.     Gait: Gait is intact. Tandem walk normal.      Comments: Patient is aphasic he is able to name simple objects with some cueing he was able to name glasses without cueing but had difficulty with ring.  He was able to say wristwatch. He is able to follow simple commands but often needs some visual cues. Motor strength is 5/5 bilateral deltoid bicep tricep grip hip flexor knee extensor ankle dorsiflexor and plantar flexor. Tone is normal bilateral upper and lower limbs Sensation difficult to assess secondary to aphasia but appears equal bilaterally to light touch           Assessment & Plan:    Left MCA infarct with primary residual impairment of aphasia.  He is back to a modified independent level.  I do not think he requires 24/7 supervision anymore.  Wife states that there will be a 39 year old son around during the day while she is at work.  He is able to drive on a limited fashion.  Has already been doing this to and from the gym which is only about 2 miles.  The patient has been able to shoot baskets.   Graduated return to driving instructions were provided. It is recommended that the patient first drives with another licensed driver in an empty parking lot. If the patient does well with this, and they can drive on a quiet street with the licensed driver. If the patient does well with this they can drive on a busy street with a licensed driver. If the patient does well with this, the next time out they can go by himself. For the first month after resuming driving, I recommend no nighttime or Interstate driving.   2.  Hypertension, also has whitecoat hypertension.  His blood pressure was 138/93 this morning and in the clinic 178/128.  I instructed him to take his blood pressure when he gets home.  If it is elevated still call his primary care physician for further instructions

## 2022-09-30 NOTE — Therapy (Signed)
OUTPATIENT SPEECH LANGUAGE PATHOLOGY  TREATMENT NOTE 10th SESSION PROGRESS NOTE    Patient Name: [name] MRN: [id number] DOB:13-Apr-1966, 57 y.o., male Today's Date: 09/29/2022  Speech Therapy Progress Note  Dates of Reporting Period: 09/12/2022 to 09/29/2022  Objective: Patient has been seen for 10 speech therapy sessions this reporting period targeting speech language abilities. Patient is making progress toward LTGs and met 1 STGs this reporting period. See skilled intervention, clinical impressions, and goals below for details.   PCP: Jon Buddy, NP REFERRING PROVIDER: Mariam Dollar, PA   End of Session - 09/29/22 1255     Visit Number 20    Number of Visits 49    Date for SLP Re-Evaluation 11/17/22    Authorization Type [employer]    Progress Note Due on Visit 20    SLP Start Time 1400    SLP Stop Time  1500    SLP Time Calculation (min) 60 min    Activity Tolerance Patient tolerated treatment well               Past Medical History:  Diagnosis Date   Hypertension    LVH (left ventricular hypertrophy)    Myocardial infarction (HCC)    Vitamin D deficiency    Past Surgical History:  Procedure Laterality Date   ANTERIOR CRUCIATE LIGAMENT REPAIR     COLONOSCOPY WITH PROPOFOL N/A 04/11/2018   Procedure: COLONOSCOPY WITH PROPOFOL;  Surgeon: [name], Jon Nearing, MD;  Location: [facility] ENDOSCOPY;  Service: Gastroenterology;  Laterality: N/A;   GASTRIC RESECTION     IR ANGIO INTRA EXTRACRAN SEL INTERNAL CAROTID UNI L MOD SED  08/15/2022   IR US GUIDE VASC ACCESS RIGHT  08/15/2022   LAPAROTOMY     LOOP RECORDER INSERTION N/A 08/18/2022   Procedure: LOOP RECORDER INSERTION;  Surgeon: Jon Lemming, MD;  [facility]: [facility] IN[facility];  Service: Cardiovascular;  Laterality: N/A;   RADIOLOGY WITH ANESTHESIA N/A 08/14/2022   Procedure: IR WITH ANESTHESIA;  Surgeon: Jon Cotton, MD;  Location: [facility] OR;  Service: Radiology;  Laterality:  N/A;   TEE WITHOUT CARDIOVERSION N/A 08/18/2022   Procedure: TRANSESOPHAGEAL ECHOCARDIOGRAM;  Surgeon: Jon Fus, MD;  Location: [facility] INVASIVE CV LAB;  Service: Cardiovascular;  Laterality: N/A;   Patient Active Problem List   Diagnosis Date Noted   Essential hypertension, benign 08/18/2022   Hyperlipidemia 08/18/2022   DM (diabetes mellitus), type 2 (HCC) 08/18/2022   Obesity (BMI 30-39.9) 08/18/2022   CAD (coronary artery disease) 08/18/2022   OSA (obstructive sleep apnea) 08/18/2022   Acute ischemic left MCA stroke (HCC) 08/18/2022   Acute ischemic right MCA stroke (HCC) 08/14/2022   Hypertensive urgency 04/27/2017   ONSET DATE: 08/14/2022; date of referral 08/23/2022    REFERRING DIAG: W09.811 (ICD-10-CM) - Cerebral infarction due to unspecified occlusion or stenosis of left middle cerebral artery   THERAPY DIAG:  Aphasia   Apraxia   Cerebral infarction due to unspecified occlusion or stenosis of left middle cerebral artery (HCC)   Rationale for Evaluation and Treatment Rehabilitation   SUBJECTIVE:      PERTINENT HISTORY:  [name] is 57 year old male who presented to the [facility] ED on 08/14/2022 noted by wife to be very weak and confused and halfway enter out of the bathtub at home. Symptoms were sudden in onset. Last known well 9 PM. Patient presented with global aphasia. Code stroke activated. CT head negative for hemorrhage, aspects of 9, mild hyperdensity left MCA could  indicate thrombus. Tenecteplase administered. CTA showed left M1 occlusion and neurointerventional radiology contacted. Patient transferred to [facility] for thrombectomy. On route he was noted to have periorbital welts and uvular swelling. Was concern of possible angioedema secondary to tenecteplase. He was intubated for airway protection and underwent cerebral angiogram by Dr. Tommie Warner. Recanalization of the left MCA vascular tree noted. Critical care medicine consulted for management and  placed on low-dose Neo-Synephrine infusion. Extubated on 4/23. 2D echo with EF of approximately 60 to 65% with trivial MVR. LDL 96, hemoglobin A1c 6.3%. He was started on heparin subcutaneously for VTE prophylaxis.     DIAGNOSTIC FINDINGS:    CT Head - 08/14/2022 Mild hyperdensity of the left MCA   CT Head and Neck Angio - 08/14/2022 Proximal occlusion of the left MCA M1 segment with poor collateralization throughout the MCA territory.   IR Angio - 08/15/2022 Interval recanalization of the left M1/MCA occlusion seen on prior CT angiogram likely as a result of TNK administration. Therefore, no intervention performed.   MRI 08/15/2022 Acute cortical infarcts along the left frontal operculum and left anterior insula with additional small area of acute cortical infarct within left parietal lobe. Other areas of faint cortical DWI hyperintensity throughout the left MCA territory, including the left putamen, may reflect additional areas of infarct or ischemia. 2. Loss of the left transverse sinus flow void is favored to reflect slow flow. If clinical suspicion for thrombosis, CT venography could be performed for further characterization.     PAIN:  Are you having pain? No   FALLS: Has patient fallen in last 6 months?  No   LIVING ENVIRONMENT: Lives with: lives with their family Lives in: House/apartment   PLOF:  Level of assistance: Independent with ADLs, Independent with IADLs Employment: Full-time employment     PATIENT GOALS    to be able to communicate again   SUBJECTIVE STATEMENT: Pt continues to be eager, he brought in homework pt's youngest son attended session with pt and son [name]) Pt accompanied by: pt's son [name])   OBJECTIVE:   TODAY'S TREATMENT:  Skilled treatment session focused on pt's communication goals. SLP facilitated session by providing the following interventions:  Word finding targeted thru vocabulary building - word assoiations - pt able to  independently select correct word in field of 3 as well as list appropriate word in 21 out of 24 opportunities.    PATIENT EDUCATION: Education details: see above Person educated: Patient and son Education method: Explanation Education comprehension: verbalized understanding and needs further education   HOME EXERCISE PROGRAM:        practice generating opposites     GOALS:   Goals reviewed with patient? Yes   SHORT TERM GOALS: Target date: 10 sessions   UPDATED GOALS: 09/12/2022  UPDATED GOALS: 09/30/2022 The patient will name common household objects at 80% accuracy given frequent maximum verbal and frequent maximum phonemic cues. Goal status: INITIAL Goal Status: MET for names of family members Goal Updated: With moderate assistance, pt will name common household objects at 80% accuracy. Goal Status: ONGOING   2.  The patient will identify the correct word given 2 choices at 80% accuracy given frequent maximal visual cues in order to increase ability to comprehend simple instructions. Goal status: INITIAL Goal status: Ongoing   3.  The patient will follow simple body commands presented auditorily at 80% accuracy given frequent maximal visual cues. Goal status: INITIAL Goal status: Ongoing   4.  The patient will  say functional words (e.g., water, toilet) at 50% accuracy given frequent maximal phonemic placement cues in order to communicate ability to communicate basic wants and needs. Goal status: INITIAL Goal status: MET Goal updated: The patient will say functional words (e.g., water, toilet) at 75% accuracy given frequent maximal phonemic placement cues in order to communicate ability to communicate basic wants and needs. Goal Status: MET; Goal updated to: moderate to minimal phonemic placement cues     LONG TERM GOALS: Target date: 11/17/2022   Updated: 09/12/2022 Pt will use multimodal means of communication to express basic biographical information about himself  with moderate assistance.  Baseline:  Goal status: INITIAL Goal status: ONGOING; ongoing progress made    2.  Pt will demonstrate > 90% speech intelligibility when reading orientation information.  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 See treatment note for details. Pt with increased verbal perseveration during today's session. Suspect this was related to pt fatigue (he continuously yawned throughout session).    OBJECTIVE IMPAIRMENTS include expressive language, receptive language, apraxia, and reading comprehension and ability to produce written language . These impairments are limiting patient from return to work, managing medications, managing appointments, managing finances, household responsibilities, ADLs/IADLs, and effectively communicating at home and in community. Factors affecting potential to achieve goals and functional outcome are severity of impairments. Patient will benefit from skilled SLP services to address above impairments and improve overall function.   REHAB POTENTIAL: Excellent   PLAN: SLP FREQUENCY: 3x/week to 4xweek   SLP DURATION: 12 weeks   PLANNED INTERVENTIONS: Language facilitation, Cueing hierachy, Functional tasks, Multimodal communication approach, SLP instruction and feedback, Compensatory strategies, and Patient/family education       Ellora Varnum B. Dreama Saa, M.S., CCC-SLP, Tree surgeon Certified Brain Injury Specialist [facility]  [facility] Office [phone] Ascom [phone] Fax [phone]

## 2022-09-30 NOTE — Patient Instructions (Addendum)
Graduated return to driving instructions were provided. It is recommended that the patient first drives with another licensed driver in an empty parking lot. If the patient does well with this, and they can drive on a quiet street with the licensed driver. If the patient does well with this they can drive on a busy street with a licensed driver. If the patient does well with this, the next time out they can go by himself. For the first month after resuming driving, I recommend no nighttime or Interstate driving.    [name] has had a stroke which has affected his speech and language abilities and which will make it difficult for him to answer questions.  Slowing down the rate of questions and mainly asking yes /no questions would be helpful .

## 2022-10-03 ENCOUNTER — Ambulatory Visit: Payer: BC Managed Care – PPO | Admitting: Speech Pathology

## 2022-10-03 DIAGNOSIS — R482 Apraxia: Secondary | ICD-10-CM

## 2022-10-03 DIAGNOSIS — I63512 Cerebral infarction due to unspecified occlusion or stenosis of left middle cerebral artery: Secondary | ICD-10-CM

## 2022-10-03 DIAGNOSIS — R4701 Aphasia: Secondary | ICD-10-CM

## 2022-10-03 NOTE — Therapy (Signed)
OUTPATIENT SPEECH LANGUAGE PATHOLOGY  TREATMENT NOTE     Patient Name: [name] MRN: [id number] DOB:[birth year], 57 y.o., male Today's Date: 10/03/2022    PCP: Myrene Buddy, NP REFERRING PROVIDER: Mariam Dollar, PA   End of Session - 10/03/22 1321     Visit Number 22    Number of Visits 49    Date for SLP Re-Evaluation 11/17/22    Authorization Type [employer]    Progress Note Due on Visit 30    SLP Start Time 1315    SLP Stop Time  1400    SLP Time Calculation (min) 45 min    Activity Tolerance Patient tolerated treatment well              Past Medical History:  Diagnosis Date   Hypertension    LVH (left ventricular hypertrophy)    Myocardial infarction (HCC)    Vitamin D deficiency    Past Surgical History:  Procedure Laterality Date   ANTERIOR CRUCIATE LIGAMENT REPAIR     COLONOSCOPY WITH PROPOFOL N/A 04/11/2018   Procedure: COLONOSCOPY WITH PROPOFOL;  Surgeon: Toledo, Boykin Nearing, MD;  Location: [facility] ENDOSCOPY;  Service: Gastroenterology;  Laterality: N/A;   GASTRIC RESECTION     IR ANGIO INTRA EXTRACRAN SEL INTERNAL CAROTID UNI L MOD SED  08/15/2022   IR US GUIDE VASC ACCESS RIGHT  08/15/2022   LAPAROTOMY     LOOP RECORDER INSERTION N/A 08/18/2022   Procedure: LOOP RECORDER INSERTION;  Surgeon: Regan Lemming, MD;  Location: [facility];  Service: Cardiovascular;  Laterality: N/A;   RADIOLOGY WITH ANESTHESIA N/A 08/14/2022   Procedure: IR WITH ANESTHESIA;  Surgeon: Julieanne Cotton, MD;  Location: [facility] OR;  Service: Radiology;  Laterality: N/A;   TEE WITHOUT CARDIOVERSION N/A 08/18/2022   Procedure: TRANSESOPHAGEAL ECHOCARDIOGRAM;  Surgeon: [name], MD;  Location: [facility] INVASIVE CV LAB;  Service: Cardiovascular;  Laterality: N/A;   Patient Active Problem List   Diagnosis Date Noted   Essential hypertension, benign 08/18/2022   Hyperlipidemia 08/18/2022   DM (diabetes mellitus), type 2 (HCC) 08/18/2022   Obesity  (BMI 30-39.9) 08/18/2022   CAD (coronary artery disease) 08/18/2022   OSA (obstructive sleep apnea) 08/18/2022   Acute ischemic left MCA stroke (HCC) 08/18/2022   Acute ischemic right MCA stroke (HCC) 08/14/2022   Hypertensive urgency 04/27/2017   ONSET DATE: 08/14/2022; date of referral 08/23/2022    REFERRING DIAG: W09.811 (ICD-10-CM) - Cerebral infarction due to unspecified occlusion or stenosis of left middle cerebral artery   THERAPY DIAG:  Aphasia   Apraxia   Cerebral infarction due to unspecified occlusion or stenosis of left middle cerebral artery (HCC)   Rationale for Evaluation and Treatment Rehabilitation   SUBJECTIVE:      PERTINENT HISTORY:  [name] is 57 year old male who presented to the [facility] ED on 08/14/2022 noted by wife to be very weak and confused and halfway enter out of the bathtub at home. Symptoms were sudden in onset. Last known well 9 PM. Patient presented with global aphasia. Code stroke activated. CT head negative for hemorrhage, aspects of 9, mild hyperdensity left MCA could indicate thrombus. Tenecteplase administered. CTA showed left M1 occlusion and neurointerventional radiology contacted. Patient transferred to [facility] for thrombectomy. On route he was noted to have periorbital welts and uvular swelling. Was concern of possible angioedema secondary to tenecteplase. He was intubated for airway protection and underwent cerebral angiogram by Dr. Tommie Sams. Recanalization of  the left MCA vascular tree noted. Critical care medicine consulted for management and placed on low-dose Neo-Synephrine infusion. Extubated on 4/23. 2D echo with EF of approximately 60 to 65% with trivial MVR. LDL 96, hemoglobin A1c 6.3%. He was started on heparin subcutaneously for VTE prophylaxis.     DIAGNOSTIC FINDINGS:    CT Head - 08/14/2022 Mild hyperdensity of the left MCA   CT Head and Neck Angio - 08/14/2022 Proximal occlusion of the left MCA M1 segment  with poor collateralization throughout the MCA territory.   IR Angio - 08/15/2022 Interval recanalization of the left M1/MCA occlusion seen on prior CT angiogram likely as a result of TNK administration. Therefore, no intervention performed.   MRI 08/15/2022 Acute cortical infarcts along the left frontal operculum and left anterior insula with additional small area of acute cortical infarct within left parietal lobe. Other areas of faint cortical DWI hyperintensity throughout the left MCA territory, including the left putamen, may reflect additional areas of infarct or ischemia. 2. Loss of the left transverse sinus flow void is favored to reflect slow flow. If clinical suspicion for thrombosis, CT venography could be performed for further characterization.     PAIN:  Are you having pain? No   FALLS: Has patient fallen in last 6 months?  No   LIVING ENVIRONMENT: Lives with: lives with their family Lives in: House/apartment   PLOF:  Level of assistance: Independent with ADLs, Independent with IADLs Employment: Full-time employment     PATIENT GOALS    to be able to communicate again   SUBJECTIVE STATEMENT: Pt continues to be eager, pt's youngest son attended session with pt and son [name]) Pt accompanied by: pt's son [name])   OBJECTIVE:   TODAY'S TREATMENT:  Skilled treatment session focused on pt's communication goals. SLP facilitated session by providing the following interventions:  Pt with improved ability. As such, pt is now able to participate in formal testing, see below.     Western Aphasia Battery- Revised  Spontaneous Speech                           Information content               0/10                                            Fluency                                 0/10                                          Comprehension     Yes/No questions                 45/60                                           Auditory Word Recognition  [phone]  Sequential Commands       26/80                              Repetition                             56/100                                         Will complete assessment during next sessions.   PATIENT EDUCATION: Education details: see above Person educated: Patient and son Education method: Explanation Education comprehension: verbalized understanding and needs further education   HOME EXERCISE PROGRAM:  Talk Path Therapy tasks     GOALS:   Goals reviewed with patient? Yes   SHORT TERM GOALS: Target date: 10 sessions   UPDATED GOALS: 09/12/2022 The patient will name common household objects at 80% accuracy given frequent maximum verbal and frequent maximum phonemic cues. Goal status: INITIAL Goal Status: MET for names of family members Goal Updated: With moderate assistance, pt will name common household objects at 80% accuracy.    2.  The patient will identify the correct word given 2 choices at 80% accuracy given frequent maximal visual cues in order to increase ability to comprehend simple instructions. Goal status: INITIAL Goal status: Ongoing   3.  The patient will follow simple body commands presented auditorily at 80% accuracy given frequent maximal visual cues. Goal status: INITIAL Goal status: Ongoing   4.  The patient will say functional words (e.g., water, toilet) at 50% accuracy given frequent maximal phonemic placement cues in order to communicate ability to communicate basic wants and needs. Goal status: INITIAL Goal status: MET Goal updated: The patient will say functional words (e.g., water, toilet) at 75% accuracy given frequent maximal phonemic placement cues in order to communicate ability to communicate basic wants and needs.     LONG TERM GOALS: Target date: 11/17/2022   Updated: 09/12/2022 Pt will use multimodal means of communication to express basic biographical information about himself with moderate assistance.  Baseline:   Goal status: INITIAL Goal status: ONGOING    2.  Pt will demonstrate > 90% speech intelligibility when reading orientation information.  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 See treatment note for details.    OBJECTIVE IMPAIRMENTS include expressive language, receptive language, apraxia, and reading comprehension and ability to produce written language . These impairments are limiting patient from return to work, managing medications, managing appointments, managing finances, household responsibilities, ADLs/IADLs, and effectively communicating at home and in community. Factors affecting potential to achieve goals and functional outcome are severity of impairments. Patient will benefit from skilled SLP services to address above impairments and improve overall function.   REHAB POTENTIAL: Excellent   PLAN: SLP FREQUENCY: 3x/week to 4xweek   SLP DURATION: 12 weeks   PLANNED INTERVENTIONS: Language facilitation, Cueing hierachy, Functional tasks, Multimodal communication approach, SLP instruction and feedback, Compensatory strategies, and Patient/family education       Chaela Branscum B. Dreama Saa, M.S., CCC-SLP, CBIS Speech-Language Pathologist Certified Brain Injury Specialist [facility]  [place] Office [phone] Ascom [phone] Fax [phone]

## 2022-10-04 ENCOUNTER — Ambulatory Visit: Payer: BC Managed Care – PPO | Admitting: Speech Pathology

## 2022-10-04 DIAGNOSIS — R4701 Aphasia: Secondary | ICD-10-CM

## 2022-10-04 DIAGNOSIS — R482 Apraxia: Secondary | ICD-10-CM

## 2022-10-04 DIAGNOSIS — I63512 Cerebral infarction due to unspecified occlusion or stenosis of left middle cerebral artery: Secondary | ICD-10-CM

## 2022-10-04 NOTE — Therapy (Unsigned)
OUTPATIENT SPEECH LANGUAGE PATHOLOGY  TREATMENT NOTE     Patient Name: [name] MRN: [id number] DOB:1965-05-04, 57 y.o., male Today's Date: 10/04/2022    PCP: Myrene Buddy, NP REFERRING PROVIDER: Mariam Dollar, PA   End of Session - 10/04/22 1302     Visit Number 23    Number of Visits 49    Date for SLP Re-Evaluation 11/17/22    Authorization Type [employer]    Progress Note Due on Visit 30    SLP Start Time [phone]    SLP Stop Time  0930    SLP Time Calculation (min) 38 min    Activity Tolerance Patient tolerated treatment well              Past Medical History:  Diagnosis Date   Hypertension    LVH (left ventricular hypertrophy)    Myocardial infarction (HCC)    Vitamin D deficiency    Past Surgical History:  Procedure Laterality Date   ANTERIOR CRUCIATE LIGAMENT REPAIR     COLONOSCOPY WITH PROPOFOL N/A 04/11/2018   Procedure: COLONOSCOPY WITH PROPOFOL;  Surgeon: Toledo, Boykin Nearing, MD;  Location: [facility] ENDOSCOPY;  Service: Gastroenterology;  Laterality: N/A;   GASTRIC RESECTION     IR ANGIO INTRA EXTRACRAN SEL INTERNAL CAROTID UNI L MOD SED  08/15/2022   IR US GUIDE VASC ACCESS RIGHT  08/15/2022   LAPAROTOMY     LOOP RECORDER INSERTION N/A 08/18/2022   Procedure: LOOP RECORDER INSERTION;  Surgeon: Regan Lemming, MD;  [facility]: [facility];  Service: Cardiovascular;  Laterality: N/A;   RADIOLOGY WITH ANESTHESIA N/A 08/14/2022   Procedure: IR WITH ANESTHESIA;  Surgeon: Julieanne Cotton, MD;  Location: [facility] OR;  Service: Radiology;  Laterality: N/A;   TEE WITHOUT CARDIOVERSION N/A 08/18/2022   Procedure: TRANSESOPHAGEAL ECHOCARDIOGRAM;  Surgeon: [name], MD;  Location: [facility] INVASIVE CV LAB;  Service: Cardiovascular;  Laterality: N/A;   Patient Active Problem List   Diagnosis Date Noted   Essential hypertension, benign 08/18/2022   Hyperlipidemia 08/18/2022   DM (diabetes mellitus), type 2 (HCC) 08/18/2022   Obesity  (BMI 30-39.9) 08/18/2022   CAD (coronary artery disease) 08/18/2022   OSA (obstructive sleep apnea) 08/18/2022   Acute ischemic left MCA stroke (HCC) 08/18/2022   Acute ischemic right MCA stroke (HCC) 08/14/2022   Hypertensive urgency 04/27/2017   ONSET DATE: 08/14/2022; date of referral 08/23/2022    REFERRING DIAG: J19.147 (ICD-10-CM) - Cerebral infarction due to unspecified occlusion or stenosis of left middle cerebral artery   THERAPY DIAG:  Aphasia   Apraxia   Cerebral infarction due to unspecified occlusion or stenosis of left middle cerebral artery (HCC)   Rationale for Evaluation and Treatment Rehabilitation   SUBJECTIVE:      PERTINENT HISTORY:  [name] is 57 year old male who presented to the [facility] ED on 08/14/2022 noted by wife to be very weak and confused and halfway enter out of the bathtub at home. Symptoms were sudden in onset. Last known well 9 PM. Patient presented with global aphasia. Code stroke activated. CT head negative for hemorrhage, aspects of 9, mild hyperdensity left MCA could indicate thrombus. Tenecteplase administered. CTA showed left M1 occlusion and neurointerventional radiology contacted. Patient transferred to [facility] for thrombectomy. On route he was noted to have periorbital welts and uvular swelling. Was concern of possible angioedema secondary to tenecteplase. He was intubated for airway protection and underwent cerebral angiogram by Dr. Tommie Sams. Recanalization of  the left MCA vascular tree noted. Critical care medicine consulted for management and placed on low-dose Neo-Synephrine infusion. Extubated on 4/23. 2D echo with EF of approximately 60 to 65% with trivial MVR. LDL 96, hemoglobin A1c 6.3%. He was started on heparin subcutaneously for VTE prophylaxis.     DIAGNOSTIC FINDINGS:    CT Head - 08/14/2022 Mild hyperdensity of the left MCA   CT Head and Neck Angio - 08/14/2022 Proximal occlusion of the left MCA M1 segment  with poor collateralization throughout the MCA territory.   IR Angio - 08/15/2022 Interval recanalization of the left M1/MCA occlusion seen on prior CT angiogram likely as a result of TNK administration. Therefore, no intervention performed.   MRI 08/15/2022 Acute cortical infarcts along the left frontal operculum and left anterior insula with additional small area of acute cortical infarct within left parietal lobe. Other areas of faint cortical DWI hyperintensity throughout the left MCA territory, including the left putamen, may reflect additional areas of infarct or ischemia. 2. Loss of the left transverse sinus flow void is favored to reflect slow flow. If clinical suspicion for thrombosis, CT venography could be performed for further characterization.     PAIN:  Are you having pain? No   FALLS: Has patient fallen in last 6 months?  No   LIVING ENVIRONMENT: Lives with: lives with their family Lives in: House/apartment   PLOF:  Level of assistance: Independent with ADLs, Independent with IADLs Employment: Full-time employment     PATIENT GOALS    to be able to communicate again   SUBJECTIVE STATEMENT: Pt's wife attended session today. "He is using more full sentences" Pt accompanied by: pt's wife   OBJECTIVE:   TODAY'S TREATMENT:  Skilled treatment session focused on pt's communication goals. SLP facilitated session by providing the following interventions:  Completion of formal language assessment today.    Western Aphasia Battery- Revised  10/03/2022 Spontaneous Speech                           Information content               0/10                                            Fluency                                 0/10                                          Comprehension     Yes/No questions                 45/60                                           Auditory Word Recognition  49/60                                Sequential Commands  26/80                               Repetition                             62/100    10/04/2022 Naming    Object Naming                     7/60                                           Word Fluency                        2/20                                            Sentence Completion          7/10                                             Responsive Speech              5/10                                          Aphasia Quotient                  28.6/100         Pt's severity rating was very severe as indicated by an Aphasia Quotient of 28.6 (0-25=very severe).   Education provided on TalkPath Therapy recommend completing with spouse of pt's son [name])                                         PATIENT EDUCATION: Education details: see above Person educated: Patient and son Education method: Explanation Education comprehension: verbalized understanding and needs further education   HOME EXERCISE PROGRAM:  Talk Path Therapy tasks     GOALS:   Goals reviewed with patient? Yes   SHORT TERM GOALS: Target date: 10 sessions   UPDATED GOALS: 09/12/2022 The patient will name common household objects at 80% accuracy given frequent maximum verbal and frequent maximum phonemic cues. Goal status: INITIAL Goal Status: MET for names of family members Goal Updated: With moderate assistance, pt will name common household objects at 80% accuracy.    2.  The patient will identify the correct word given 2 choices at 80% accuracy given frequent maximal visual cues in order to increase ability to comprehend simple instructions. Goal status: INITIAL Goal status: Ongoing   3.  The patient will follow simple body commands presented auditorily at 80% accuracy given frequent maximal visual cues. Goal status: INITIAL Goal status: Ongoing   4.  The patient will say functional words (e.g., water, toilet) at 50% accuracy given frequent  maximal phonemic placement cues in order to communicate ability to  communicate basic wants and needs. Goal status: INITIAL Goal status: MET Goal updated: The patient will say functional words (e.g., water, toilet) at 75% accuracy given frequent maximal phonemic placement cues in order to communicate ability to communicate basic wants and needs.     LONG TERM GOALS: Target date: 11/17/2022   Updated: 09/12/2022 Pt will use multimodal means of communication to express basic biographical information about himself with moderate assistance.  Baseline:  Goal status: INITIAL Goal status: ONGOING    2.  Pt will demonstrate > 90% speech intelligibility when reading orientation information.  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 See treatment note for details.    OBJECTIVE IMPAIRMENTS include expressive language, receptive language, apraxia, and reading comprehension and ability to produce written language . These impairments are limiting patient from return to work, managing medications, managing appointments, managing finances, household responsibilities, ADLs/IADLs, and effectively communicating at home and in community. Factors affecting potential to achieve goals and functional outcome are severity of impairments. Patient will benefit from skilled SLP services to address above impairments and improve overall function.   REHAB POTENTIAL: Excellent   PLAN: SLP FREQUENCY: 3x/week to 4xweek   SLP DURATION: 12 weeks   PLANNED INTERVENTIONS: Language facilitation, Cueing hierachy, Functional tasks, Multimodal communication approach, SLP instruction and feedback, Compensatory strategies, and Patient/family education       Urania Pearlman B. Dreama Saa, M.S., CCC-SLP, Tree surgeon Certified Brain Injury Specialist [facility]   [facility] Rehabilitation Services Office [phone] Ascom [phone] Fax [phone]

## 2022-10-05 ENCOUNTER — Ambulatory Visit: Payer: BC Managed Care – PPO | Admitting: Speech Pathology

## 2022-10-05 DIAGNOSIS — R4701 Aphasia: Secondary | ICD-10-CM | POA: Diagnosis not present

## 2022-10-05 DIAGNOSIS — R482 Apraxia: Secondary | ICD-10-CM

## 2022-10-05 DIAGNOSIS — I63512 Cerebral infarction due to unspecified occlusion or stenosis of left middle cerebral artery: Secondary | ICD-10-CM

## 2022-10-05 NOTE — Therapy (Signed)
OUTPATIENT SPEECH LANGUAGE PATHOLOGY  TREATMENT NOTE     Patient Name: [name] MRN: [id number] DOB:09-Nov-1965, 57 y.o., male Today's Date: 10/05/2022    PCP: Myrene Buddy, NP REFERRING PROVIDER: Mariam Dollar, PA   End of Session - 10/05/22 1324     Visit Number 24    Number of Visits 49    Date for SLP Re-Evaluation 11/17/22    Authorization Type [employer]    Progress Note Due on Visit 30    SLP Start Time 1315    SLP Stop Time  1400    SLP Time Calculation (min) 45 min    Activity Tolerance Patient tolerated treatment well              Past Medical History:  Diagnosis Date   Hypertension    LVH (left ventricular hypertrophy)    Myocardial infarction (HCC)    Vitamin D deficiency    Past Surgical History:  Procedure Laterality Date   ANTERIOR CRUCIATE LIGAMENT REPAIR     COLONOSCOPY WITH PROPOFOL N/A 04/11/2018   Procedure: COLONOSCOPY WITH PROPOFOL;  Surgeon: Toledo, Boykin Nearing, MD;  Location: [facility] ENDOSCOPY;  Service: Gastroenterology;  Laterality: N/A;   GASTRIC RESECTION     IR ANGIO INTRA EXTRACRAN SEL INTERNAL CAROTID UNI L MOD SED  08/15/2022   IR US GUIDE VASC ACCESS RIGHT  08/15/2022   LAPAROTOMY     LOOP RECORDER INSERTION N/A 08/18/2022   Procedure: LOOP RECORDER INSERTION;  Surgeon: Regan Lemming, MD;  Location: [facility] INVASIVE CV LAB;  Service: Cardiovascular;  Laterality: N/A;   RADIOLOGY WITH ANESTHESIA N/A 08/14/2022   Procedure: IR WITH ANESTHESIA;  Surgeon: Julieanne Cotton, MD;  Location: [facility] OR;  Service: Radiology;  Laterality: N/A;   TEE WITHOUT CARDIOVERSION N/A 08/18/2022   Procedure: TRANSESOPHAGEAL ECHOCARDIOGRAM;  Surgeon: [name], MD;  Location: [facility] INVASIVE CV LAB;  Service: Cardiovascular;  Laterality: N/A;   Patient Active Problem List   Diagnosis Date Noted   Essential hypertension, benign 08/18/2022   Hyperlipidemia 08/18/2022   DM (diabetes mellitus), type 2 (HCC) 08/18/2022   Obesity  (BMI 30-39.9) 08/18/2022   CAD (coronary artery disease) 08/18/2022   OSA (obstructive sleep apnea) 08/18/2022   Acute ischemic left MCA stroke (HCC) 08/18/2022   Acute ischemic right MCA stroke (HCC) 08/14/2022   Hypertensive urgency 04/27/2017   ONSET DATE: 08/14/2022; date of referral 08/23/2022    REFERRING DIAG: W09.811 (ICD-10-CM) - Cerebral infarction due to unspecified occlusion or stenosis of left middle cerebral artery   THERAPY DIAG:  Aphasia   Apraxia   Cerebral infarction due to unspecified occlusion or stenosis of left middle cerebral artery (HCC)   Rationale for Evaluation and Treatment Rehabilitation   SUBJECTIVE:      PERTINENT HISTORY:  [name] is 57 year old male who presented to the [facility] ED on 08/14/2022 noted by wife to be very weak and confused and halfway enter out of the bathtub at home. Symptoms were sudden in onset. Last known well 9 PM. Patient presented with global aphasia. Code stroke activated. CT head negative for hemorrhage, aspects of 9, mild hyperdensity left MCA could indicate thrombus. Tenecteplase administered. CTA showed left M1 occlusion and neurointerventional radiology contacted. Patient transferred to [facility] for thrombectomy. On route he was noted to have periorbital welts and uvular swelling. Was concern of possible angioedema secondary to tenecteplase. He was intubated for airway protection and underwent cerebral angiogram by Dr. Tommie Sams. Recanalization of  the left MCA vascular tree noted. Critical care medicine consulted for management and placed on low-dose Neo-Synephrine infusion. Extubated on 4/23. 2D echo with EF of approximately 60 to 65% with trivial MVR. LDL 96, hemoglobin A1c 6.3%. He was started on heparin subcutaneously for VTE prophylaxis.     DIAGNOSTIC FINDINGS:    CT Head - 08/14/2022 Mild hyperdensity of the left MCA   CT Head and Neck Angio - 08/14/2022 Proximal occlusion of the left MCA M1 segment  with poor collateralization throughout the MCA territory.   IR Angio - 08/15/2022 Interval recanalization of the left M1/MCA occlusion seen on prior CT angiogram likely as a result of TNK administration. Therefore, no intervention performed.   MRI 08/15/2022 Acute cortical infarcts along the left frontal operculum and left anterior insula with additional small area of acute cortical infarct within left parietal lobe. Other areas of faint cortical DWI hyperintensity throughout the left MCA territory, including the left putamen, may reflect additional areas of infarct or ischemia. 2. Loss of the left transverse sinus flow void is favored to reflect slow flow. If clinical suspicion for thrombosis, CT venography could be performed for further characterization.     PAIN:  Are you having pain? No   FALLS: Has patient fallen in last 6 months?  No   LIVING ENVIRONMENT: Lives with: lives with their family Lives in: House/apartment   PLOF:  Level of assistance: Independent with ADLs, Independent with IADLs Employment: Full-time employment     PATIENT GOALS    to be able to communicate again   SUBJECTIVE STATEMENT: Pt's wife attended session today. "He is using more full sentences" Pt accompanied by: pt's wife   OBJECTIVE:   TODAY'S TREATMENT:  Skilled treatment session focused on pt's communication goals. SLP facilitated session by providing the following interventions:  TalkPath Therapy App utilized  [employer] with focus on listening to completed sentence prior to selecting OK to build auditory comprehension  Level 1 - 100%  Level 2 - 95%  Complete the phrase -  Level 2 - 80%  Repeat the spoken word  Level 4 - 75% improved to 90% with verbal and visual articulatory cues     PATIENT EDUCATION: Education details: see above Person educated: Patient and son Education method: Explanation Education comprehension: verbalized understanding and needs further  education   HOME EXERCISE PROGRAM:  Talk Path Therapy tasks     GOALS:   Goals reviewed with patient? Yes   SHORT TERM GOALS: Target date: 10 sessions   UPDATED GOALS: 09/12/2022 The patient will name common household objects at 80% accuracy given frequent maximum verbal and frequent maximum phonemic cues. Goal status: INITIAL Goal Status: MET for names of family members Goal Updated: With moderate assistance, pt will name common household objects at 80% accuracy.    2.  The patient will identify the correct word given 2 choices at 80% accuracy given frequent maximal visual cues in order to increase ability to comprehend simple instructions. Goal status: INITIAL Goal status: Ongoing   3.  The patient will follow simple body commands presented auditorily at 80% accuracy given frequent maximal visual cues. Goal status: INITIAL Goal status: Ongoing   4.  The patient will say functional words (e.g., water, toilet) at 50% accuracy given frequent maximal phonemic placement cues in order to communicate ability to communicate basic wants and needs. Goal status: INITIAL Goal status: MET Goal updated: The patient will say functional words (e.g., water, toilet) at 75% accuracy given frequent  maximal phonemic placement cues in order to communicate ability to communicate basic wants and needs.     LONG TERM GOALS: Target date: 11/17/2022   Updated: 09/12/2022 Pt will use multimodal means of communication to express basic biographical information about himself with moderate assistance.  Baseline:  Goal status: INITIAL Goal status: ONGOING    2.  Pt will demonstrate > 90% speech intelligibility when reading orientation information.  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 Pt with overall improved auditory comprehension as well as reading comprehension (sentence level). While he is able to repeat what is said with >80% speech intelligibility his spontaneous communication continues to be c/b perseveration, neologisms, semantic paraphasias and phonemic paraphasias. See treatment note for details.    OBJECTIVE IMPAIRMENTS include expressive language, receptive language, apraxia, and reading comprehension and ability to produce written language . These impairments are limiting patient from return to work, managing medications, managing appointments, managing finances, household responsibilities, ADLs/IADLs, and effectively communicating at home and in community. Factors affecting potential to achieve goals and functional outcome are severity of impairments. Patient will benefit from skilled SLP services to address above impairments and improve overall function.   REHAB POTENTIAL: Excellent   PLAN: SLP FREQUENCY: 3x/week to 4xweek   SLP DURATION: 12 weeks   PLANNED INTERVENTIONS: Language facilitation, Cueing hierachy, Functional tasks, Multimodal communication approach, SLP instruction and feedback, Compensatory strategies, and Patient/family education       Deangelo Berns B. Dreama Saa, M.S., CCC-SLP, Tree surgeon Certified Brain Injury Specialist [facility]  [facility] Rehabilitation Services Office [phone] Ascom [phone] Fax [phone]

## 2022-10-07 ENCOUNTER — Ambulatory Visit: Payer: BC Managed Care – PPO | Admitting: Speech Pathology

## 2022-10-07 DIAGNOSIS — R4701 Aphasia: Secondary | ICD-10-CM

## 2022-10-07 DIAGNOSIS — R482 Apraxia: Secondary | ICD-10-CM

## 2022-10-07 DIAGNOSIS — I63512 Cerebral infarction due to unspecified occlusion or stenosis of left middle cerebral artery: Secondary | ICD-10-CM

## 2022-10-08 NOTE — Therapy (Signed)
OUTPATIENT SPEECH LANGUAGE PATHOLOGY  TREATMENT NOTE     Patient Name: [name] MRN: [id number] DOB:1965-07-30, 57 y.o., male Today's Date: 10/07/2022    PCP: Myrene Buddy, NP REFERRING PROVIDER: Mariam Dollar, PA   End of Session - 10/07/22 0916     Visit Number 25    Number of Visits 49    Date for SLP Re-Evaluation 11/17/22    Authorization Type [employer]    Progress Note Due on Visit 30    SLP Start Time 1015    SLP Stop Time  1100    SLP Time Calculation (min) 45 min    Activity Tolerance Patient tolerated treatment well              Past Medical History:  Diagnosis Date   Hypertension    LVH (left ventricular hypertrophy)    Myocardial infarction (HCC)    Vitamin D deficiency    Past Surgical History:  Procedure Laterality Date   ANTERIOR CRUCIATE LIGAMENT REPAIR     COLONOSCOPY WITH PROPOFOL N/A 04/11/2018   Procedure: COLONOSCOPY WITH PROPOFOL;  Surgeon: Toledo, Boykin Nearing, MD;  Location: [facility] ENDOSCOPY;  Service: Gastroenterology;  Laterality: N/A;   GASTRIC RESECTION     IR ANGIO INTRA EXTRACRAN SEL INTERNAL CAROTID UNI L MOD SED  08/15/2022   IR US GUIDE VASC ACCESS RIGHT  08/15/2022   LAPAROTOMY     LOOP RECORDER INSERTION N/A 08/18/2022   Procedure: LOOP RECORDER INSERTION;  Surgeon: Regan Lemming, MD;  Location: [facility] INVASIVE CV LAB;  Service: Cardiovascular;  Laterality: N/A;   RADIOLOGY WITH ANESTHESIA N/A 08/14/2022   Procedure: IR WITH ANESTHESIA;  Surgeon: Julieanne Cotton, MD;  Location: [facility] OR;  Service: Radiology;  Laterality: N/A;   TEE WITHOUT CARDIOVERSION N/A 08/18/2022   Procedure: TRANSESOPHAGEAL ECHOCARDIOGRAM;  Surgeon: [name], MD;  Location: [facility] INVASIVE CV LAB;  Service: Cardiovascular;  Laterality: N/A;   Patient Active Problem List   Diagnosis Date Noted   Essential hypertension, benign 08/18/2022   Hyperlipidemia 08/18/2022   DM (diabetes mellitus), type 2 (HCC) 08/18/2022   Obesity  (BMI 30-39.9) 08/18/2022   CAD (coronary artery disease) 08/18/2022   OSA (obstructive sleep apnea) 08/18/2022   Acute ischemic left MCA stroke (HCC) 08/18/2022   Acute ischemic right MCA stroke (HCC) 08/14/2022   Hypertensive urgency 04/27/2017   ONSET DATE: 08/14/2022; date of referral 08/23/2022    REFERRING DIAG: W09.811 (ICD-10-CM) - Cerebral infarction due to unspecified occlusion or stenosis of left middle cerebral artery   THERAPY DIAG:  Aphasia   Apraxia   Cerebral infarction due to unspecified occlusion or stenosis of left middle cerebral artery (HCC)   Rationale for Evaluation and Treatment Rehabilitation   SUBJECTIVE:      PERTINENT HISTORY:  [name] is 57 year old male who presented to the [facility] ED on 08/14/2022 noted by wife to be very weak and confused and halfway enter out of the bathtub at home. Symptoms were sudden in onset. Last known well 9 PM. Patient presented with global aphasia. Code stroke activated. CT head negative for hemorrhage, aspects of 9, mild hyperdensity left MCA could indicate thrombus. Tenecteplase administered. CTA showed left M1 occlusion and neurointerventional radiology contacted. Patient transferred to [facility] for thrombectomy. On route he was noted to have periorbital welts and uvular swelling. Was concern of possible angioedema secondary to tenecteplase. He was intubated for airway protection and underwent cerebral angiogram by Dr. Tommie Sams. Recanalization of  the left MCA vascular tree noted. Critical care medicine consulted for management and placed on low-dose Neo-Synephrine infusion. Extubated on 4/23. 2D echo with EF of approximately 60 to 65% with trivial MVR. LDL 96, hemoglobin A1c 6.3%. He was started on heparin subcutaneously for VTE prophylaxis.     DIAGNOSTIC FINDINGS:    CT Head - 08/14/2022 Mild hyperdensity of the left MCA   CT Head and Neck Angio - 08/14/2022 Proximal occlusion of the left MCA M1 segment  with poor collateralization throughout the MCA territory.   IR Angio - 08/15/2022 Interval recanalization of the left M1/MCA occlusion seen on prior CT angiogram likely as a result of TNK administration. Therefore, no intervention performed.   MRI 08/15/2022 Acute cortical infarcts along the left frontal operculum and left anterior insula with additional small area of acute cortical infarct within left parietal lobe. Other areas of faint cortical DWI hyperintensity throughout the left MCA territory, including the left putamen, may reflect additional areas of infarct or ischemia. 2. Loss of the left transverse sinus flow void is favored to reflect slow flow. If clinical suspicion for thrombosis, CT venography could be performed for further characterization.     PAIN:  Are you having pain? No   FALLS: Has patient fallen in last 6 months?  No   LIVING ENVIRONMENT: Lives with: lives with their family Lives in: House/apartment   PLOF:  Level of assistance: Independent with ADLs, Independent with IADLs Employment: Full-time employment     PATIENT GOALS    to be able to communicate again   SUBJECTIVE STATEMENT: Pt's wife attended session today. "He is using more full sentences" Pt accompanied by: pt's wife   OBJECTIVE:   TODAY'S TREATMENT:  Skilled treatment session focused on pt's communication goals. SLP facilitated session by providing the following interventions:  Pt benefited from moderate verbal and written questions to provide answers to biographical information. Pt more perseverative in his verbal responses today. Uncertain if audio-feedback was helpful today.     PATIENT EDUCATION: Education details: see above Person educated: Patient and son Education method: Explanation Education comprehension: verbalized understanding and needs further education   HOME EXERCISE PROGRAM:  Talk Path Therapy tasks     GOALS:   Goals reviewed with patient? Yes   SHORT  TERM GOALS: Target date: 10 sessions   UPDATED GOALS: 09/12/2022 The patient will name common household objects at 80% accuracy given frequent maximum verbal and frequent maximum phonemic cues. Goal status: INITIAL Goal Status: MET for names of family members Goal Updated: With moderate assistance, pt will name common household objects at 80% accuracy.    2.  The patient will identify the correct word given 2 choices at 80% accuracy given frequent maximal visual cues in order to increase ability to comprehend simple instructions. Goal status: INITIAL Goal status: Ongoing   3.  The patient will follow simple body commands presented auditorily at 80% accuracy given frequent maximal visual cues. Goal status: INITIAL Goal status: Ongoing   4.  The patient will say functional words (e.g., water, toilet) at 50% accuracy given frequent maximal phonemic placement cues in order to communicate ability to communicate basic wants and needs. Goal status: INITIAL Goal status: MET Goal updated: The patient will say functional words (e.g., water, toilet) at 75% accuracy given frequent maximal phonemic placement cues in order to communicate ability to communicate basic wants and needs.     LONG TERM GOALS: Target date: 11/17/2022   Updated: 09/12/2022 Pt will use multimodal  means of communication to express basic biographical information about himself with moderate assistance.  Baseline:  Goal status: INITIAL Goal status: ONGOING    2.  Pt will demonstrate > 90% speech intelligibility when reading orientation information.  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 Pt with overall improved auditory comprehension as well as reading comprehension (sentence level). While he is able to  repeat what is said with >80% speech intelligibility his spontaneous communication continues to be c/b perseveration, neologisms, semantic paraphasias and phonemic paraphasias. See treatment note for details.    OBJECTIVE IMPAIRMENTS include expressive language, receptive language, apraxia, and reading comprehension and ability to produce written language . These impairments are limiting patient from return to work, managing medications, managing appointments, managing finances, household responsibilities, ADLs/IADLs, and effectively communicating at home and in community. Factors affecting potential to achieve goals and functional outcome are severity of impairments. Patient will benefit from skilled SLP services to address above impairments and improve overall function.   REHAB POTENTIAL: Excellent   PLAN: SLP FREQUENCY: 3x/week to 4xweek   SLP DURATION: 12 weeks   PLANNED INTERVENTIONS: Language facilitation, Cueing hierachy, Functional tasks, Multimodal communication approach, SLP instruction and feedback, Compensatory strategies, and Patient/family education       Deuntae Kocsis B. Dreama Saa, M.S., CCC-SLP, Tree surgeon Certified Brain Injury Specialist [facility]  [facility] Rehabilitation Services Office [phone] Ascom [phone] Fax [phone]

## 2022-10-10 ENCOUNTER — Ambulatory Visit: Payer: BC Managed Care – PPO | Admitting: Speech Pathology

## 2022-10-10 DIAGNOSIS — R482 Apraxia: Secondary | ICD-10-CM

## 2022-10-10 DIAGNOSIS — I63512 Cerebral infarction due to unspecified occlusion or stenosis of left middle cerebral artery: Secondary | ICD-10-CM

## 2022-10-10 DIAGNOSIS — R4701 Aphasia: Secondary | ICD-10-CM | POA: Diagnosis not present

## 2022-10-10 NOTE — Therapy (Signed)
OUTPATIENT SPEECH LANGUAGE PATHOLOGY  TREATMENT NOTE     Patient Name: [name] MRN: [id number] DOB:1966/02/01, 57 y.o., male Today's Date: 10/10/2022   PCP: Myrene Buddy, NP REFERRING PROVIDER: Mariam Dollar, PA   End of Session - 10/10/22 1444     Visit Number 36    Number of Visits 49    Date for SLP Re-Evaluation 11/17/22    Authorization Type [employer]    Progress Note Due on Visit 30    SLP Start Time 1400    SLP Stop Time  1445    SLP Time Calculation (min) 45 min    Activity Tolerance Patient tolerated treatment well              Past Medical History:  Diagnosis Date   Hypertension    LVH (left ventricular hypertrophy)    Myocardial infarction (HCC)    Vitamin D deficiency    Past Surgical History:  Procedure Laterality Date   ANTERIOR CRUCIATE LIGAMENT REPAIR     COLONOSCOPY WITH PROPOFOL N/A 04/11/2018   Procedure: COLONOSCOPY WITH PROPOFOL;  Surgeon: Toledo, Boykin Nearing, MD;  Location: [facility] ENDOSCOPY;  Service: Gastroenterology;  Laterality: N/A;   GASTRIC RESECTION     IR ANGIO INTRA EXTRACRAN SEL INTERNAL CAROTID UNI L MOD SED  08/15/2022   IR US GUIDE VASC ACCESS RIGHT  08/15/2022   LAPAROTOMY     LOOP RECORDER INSERTION N/A 08/18/2022   Procedure: LOOP RECORDER INSERTION;  Surgeon: Regan Lemming, MD;  Location: [facility] INVASIVE CV LAB;  Service: Cardiovascular;  Laterality: N/A;   RADIOLOGY WITH ANESTHESIA N/A 08/14/2022   Procedure: IR WITH ANESTHESIA;  Surgeon: Julieanne Cotton, MD;  Location: [facility] OR;  Service: Radiology;  Laterality: N/A;   TEE WITHOUT CARDIOVERSION N/A 08/18/2022   Procedure: TRANSESOPHAGEAL ECHOCARDIOGRAM;  Surgeon: [name], MD;  Location: [facility] INVASIVE CV LAB;  Service: Cardiovascular;  Laterality: N/A;   Patient Active Problem List   Diagnosis Date Noted   Essential hypertension, benign 08/18/2022   Hyperlipidemia 08/18/2022   DM (diabetes mellitus), type 2 (HCC) 08/18/2022   Obesity  (BMI 30-39.9) 08/18/2022   CAD (coronary artery disease) 08/18/2022   OSA (obstructive sleep apnea) 08/18/2022   Acute ischemic left MCA stroke (HCC) 08/18/2022   Acute ischemic right MCA stroke (HCC) 08/14/2022   Hypertensive urgency 04/27/2017   ONSET DATE: 08/14/2022; date of referral 08/23/2022    REFERRING DIAG: W41.324 (ICD-10-CM) - Cerebral infarction due to unspecified occlusion or stenosis of left middle cerebral artery   THERAPY DIAG:  Aphasia   Apraxia   Cerebral infarction due to unspecified occlusion or stenosis of left middle cerebral artery (HCC)   Rationale for Evaluation and Treatment Rehabilitation   SUBJECTIVE:      PERTINENT HISTORY:  [name] is 57 year old male who presented to the [facility] ED on 08/14/2022 noted by wife to be very weak and confused and halfway enter out of the bathtub at home. Symptoms were sudden in onset. Last known well 9 PM. Patient presented with global aphasia. Code stroke activated. CT head negative for hemorrhage, aspects of 9, mild hyperdensity left MCA could indicate thrombus. Tenecteplase administered. CTA showed left M1 occlusion and neurointerventional radiology contacted. Patient transferred to [facility] for thrombectomy. On route he was noted to have periorbital welts and uvular swelling. Was concern of possible angioedema secondary to tenecteplase. He was intubated for airway protection and underwent cerebral angiogram by Dr. Tommie Sams. Recanalization of the  left MCA vascular tree noted. Critical care medicine consulted for management and placed on low-dose Neo-Synephrine infusion. Extubated on 4/23. 2D echo with EF of approximately 60 to 65% with trivial MVR. LDL 96, hemoglobin A1c 6.3%. He was started on heparin subcutaneously for VTE prophylaxis.     DIAGNOSTIC FINDINGS:    CT Head - 08/14/2022 Mild hyperdensity of the left MCA   CT Head and Neck Angio - 08/14/2022 Proximal occlusion of the left MCA M1 segment  with poor collateralization throughout the MCA territory.   IR Angio - 08/15/2022 Interval recanalization of the left M1/MCA occlusion seen on prior CT angiogram likely as a result of TNK administration. Therefore, no intervention performed.   MRI 08/15/2022 Acute cortical infarcts along the left frontal operculum and left anterior insula with additional small area of acute cortical infarct within left parietal lobe. Other areas of faint cortical DWI hyperintensity throughout the left MCA territory, including the left putamen, may reflect additional areas of infarct or ischemia. 2. Loss of the left transverse sinus flow void is favored to reflect slow flow. If clinical suspicion for thrombosis, CT venography could be performed for further characterization.     PAIN:  Are you having pain? No   FALLS: Has patient fallen in last 6 months?  No   LIVING ENVIRONMENT: Lives with: lives with their family Lives in: House/apartment   PLOF:  Level of assistance: Independent with ADLs, Independent with IADLs Employment: Full-time employment     PATIENT GOALS    to be able to communicate again   SUBJECTIVE STATEMENT: Pt completed HEP on TalkPath Therapy x1 over the weekend Pt accompanied by: pt's wife   OBJECTIVE:   TODAY'S TREATMENT:  Skilled treatment session focused on pt's communication goals. SLP facilitated session by providing the following interventions:  Speech intelligibility when stating names of family members - 8 out of 9 opportunities when provided with visual articulatory cue  TalkPath Therapy utilized to target categories  Items in category - level 1 17 out of 20  Sequence words 13 out of 20 improved to 20 out of 20 with verbal cues    PATIENT EDUCATION: Education details: see above Person educated: Patient and son Education method: Explanation Education comprehension: verbalized understanding and needs further education   HOME EXERCISE PROGRAM:  Talk Path  Therapy tasks     GOALS:   Goals reviewed with patient? Yes   SHORT TERM GOALS: Target date: 10 sessions   UPDATED GOALS: 09/12/2022 The patient will name common household objects at 80% accuracy given frequent maximum verbal and frequent maximum phonemic cues. Goal status: INITIAL Goal Status: MET for names of family members Goal Updated: With moderate assistance, pt will name common household objects at 80% accuracy.    2.  The patient will identify the correct word given 2 choices at 80% accuracy given frequent maximal visual cues in order to increase ability to comprehend simple instructions. Goal status: INITIAL Goal status: Ongoing   3.  The patient will follow simple body commands presented auditorily at 80% accuracy given frequent maximal visual cues. Goal status: INITIAL Goal status: Ongoing   4.  The patient will say functional words (e.g., water, toilet) at 50% accuracy given frequent maximal phonemic placement cues in order to communicate ability to communicate basic wants and needs. Goal status: INITIAL Goal status: MET Goal updated: The patient will say functional words (e.g., water, toilet) at 75% accuracy given frequent maximal phonemic placement cues in order to communicate ability to  communicate basic wants and needs.     LONG TERM GOALS: Target date: 11/17/2022   Updated: 09/12/2022 Pt will use multimodal means of communication to express basic biographical information about himself with moderate assistance.  Baseline:  Goal status: INITIAL Goal status: ONGOING    2.  Pt will demonstrate > 90% speech intelligibility when reading orientation information.  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 Pt's family continues to report increased  expressive abilities at home. This is also observed within our treatment sessions. See the treatment note above for more details.    OBJECTIVE IMPAIRMENTS include expressive language, receptive language, apraxia, and reading comprehension and ability to produce written language . These impairments are limiting patient from return to work, managing medications, managing appointments, managing finances, household responsibilities, ADLs/IADLs, and effectively communicating at home and in community. Factors affecting potential to achieve goals and functional outcome are severity of impairments. Patient will benefit from skilled SLP services to address above impairments and improve overall function.   REHAB POTENTIAL: Excellent   PLAN: SLP FREQUENCY: 3x/week to 4xweek   SLP DURATION: 12 weeks   PLANNED INTERVENTIONS: Language facilitation, Cueing hierachy, Functional tasks, Multimodal communication approach, SLP instruction and feedback, Compensatory strategies, and Patient/family education       Tamy Accardo B. Dreama Saa, M.S., CCC-SLP, Tree surgeon Certified Brain Injury Specialist [facility]  [facility] Rehabilitation Services Office [phone] Ascom [phone] Fax [phone]

## 2022-10-11 ENCOUNTER — Ambulatory Visit: Payer: BC Managed Care – PPO | Admitting: Speech Pathology

## 2022-10-11 DIAGNOSIS — R4701 Aphasia: Secondary | ICD-10-CM

## 2022-10-11 DIAGNOSIS — I63512 Cerebral infarction due to unspecified occlusion or stenosis of left middle cerebral artery: Secondary | ICD-10-CM

## 2022-10-11 DIAGNOSIS — R482 Apraxia: Secondary | ICD-10-CM

## 2022-10-11 NOTE — Therapy (Signed)
OUTPATIENT SPEECH LANGUAGE PATHOLOGY  TREATMENT NOTE     Patient Name: [name] MRN: [id number] DOB:April 10, 1966, 57 y.o., male Today's Date: 10/11/2022   PCP: Myrene Buddy, NP REFERRING PROVIDER: Mariam Dollar, PA   End of Session - 10/11/22 1317     Visit Number 37    Number of Visits 49    Date for SLP Re-Evaluation 11/17/22    Authorization Type [employer]    Progress Note Due on Visit 30    SLP Start Time 1315    SLP Stop Time  1400    SLP Time Calculation (min) 45 min    Activity Tolerance Patient tolerated treatment well              Past Medical History:  Diagnosis Date   Hypertension    LVH (left ventricular hypertrophy)    Myocardial infarction (HCC)    Vitamin D deficiency    Past Surgical History:  Procedure Laterality Date   ANTERIOR CRUCIATE LIGAMENT REPAIR     COLONOSCOPY WITH PROPOFOL N/A 04/11/2018   Procedure: COLONOSCOPY WITH PROPOFOL;  Surgeon: Toledo, Boykin Nearing, MD;  Location: [facility] ENDOSCOPY;  Service: Gastroenterology;  Laterality: N/A;   GASTRIC RESECTION     IR ANGIO INTRA EXTRACRAN SEL INTERNAL CAROTID UNI L MOD SED  08/15/2022   IR US GUIDE VASC ACCESS RIGHT  08/15/2022   LAPAROTOMY     LOOP RECORDER INSERTION N/A 08/18/2022   Procedure: LOOP RECORDER INSERTION;  Surgeon: Regan Lemming, MD;  Location: [facility] INVASIVE CV LAB;  Service: Cardiovascular;  Laterality: N/A;   RADIOLOGY WITH ANESTHESIA N/A 08/14/2022   Procedure: IR WITH ANESTHESIA;  Surgeon: Julieanne Cotton, MD;  Location: [facility] OR;  Service: Radiology;  Laterality: N/A;   TEE WITHOUT CARDIOVERSION N/A 08/18/2022   Procedure: TRANSESOPHAGEAL ECHOCARDIOGRAM;  Surgeon: [name], MD;  Location: [facility] INVASIVE CV LAB;  Service: Cardiovascular;  Laterality: N/A;   Patient Active Problem List   Diagnosis Date Noted   Essential hypertension, benign 08/18/2022   Hyperlipidemia 08/18/2022   DM (diabetes mellitus), type 2 (HCC) 08/18/2022   Obesity  (BMI 30-39.9) 08/18/2022   CAD (coronary artery disease) 08/18/2022   OSA (obstructive sleep apnea) 08/18/2022   Acute ischemic left MCA stroke (HCC) 08/18/2022   Acute ischemic right MCA stroke (HCC) 08/14/2022   Hypertensive urgency 04/27/2017   ONSET DATE: 08/14/2022; date of referral 08/23/2022    REFERRING DIAG: X91.478 (ICD-10-CM) - Cerebral infarction due to unspecified occlusion or stenosis of left middle cerebral artery   THERAPY DIAG:  Aphasia   Apraxia   Cerebral infarction due to unspecified occlusion or stenosis of left middle cerebral artery (HCC)   Rationale for Evaluation and Treatment Rehabilitation   SUBJECTIVE:      PERTINENT HISTORY:  [name] is 57 year old male who presented to the [facility] ED on 08/14/2022 noted by wife to be very weak and confused and halfway enter out of the bathtub at home. Symptoms were sudden in onset. Last known well 9 PM. Patient presented with global aphasia. Code stroke activated. CT head negative for hemorrhage, aspects of 9, mild hyperdensity left MCA could indicate thrombus. Tenecteplase administered. CTA showed left M1 occlusion and neurointerventional radiology contacted. Patient transferred to [facility] for thrombectomy. On route he was noted to have periorbital welts and uvular swelling. Was concern of possible angioedema secondary to tenecteplase. He was intubated for airway protection and underwent cerebral angiogram by Dr. Tommie Sams. Recanalization of the  left MCA vascular tree noted. Critical care medicine consulted for management and placed on low-dose Neo-Synephrine infusion. Extubated on 4/23. 2D echo with EF of approximately 60 to 65% with trivial MVR. LDL 96, hemoglobin A1c 6.3%. He was started on heparin subcutaneously for VTE prophylaxis.     DIAGNOSTIC FINDINGS:    CT Head - 08/14/2022 Mild hyperdensity of the left MCA   CT Head and Neck Angio - 08/14/2022 Proximal occlusion of the left MCA M1 segment  with poor collateralization throughout the MCA territory.   IR Angio - 08/15/2022 Interval recanalization of the left M1/MCA occlusion seen on prior CT angiogram likely as a result of TNK administration. Therefore, no intervention performed.   MRI 08/15/2022 Acute cortical infarcts along the left frontal operculum and left anterior insula with additional small area of acute cortical infarct within left parietal lobe. Other areas of faint cortical DWI hyperintensity throughout the left MCA territory, including the left putamen, may reflect additional areas of infarct or ischemia. 2. Loss of the left transverse sinus flow void is favored to reflect slow flow. If clinical suspicion for thrombosis, CT venography could be performed for further characterization.     PAIN:  Are you having pain? No   FALLS: Has patient fallen in last 6 months?  No   LIVING ENVIRONMENT: Lives with: lives with their family Lives in: House/apartment   PLOF:  Level of assistance: Independent with ADLs, Independent with IADLs Employment: Full-time employment     PATIENT GOALS    to be able to communicate again   SUBJECTIVE STATEMENT: Pt completed HEP on Talk[employer] Therapy x1 over the weekend Pt accompanied by: pt's wife   OBJECTIVE:   TODAY'S TREATMENT:  Skilled treatment session focused on pt's communication goals. SLP facilitated session by providing the following interventions:  Speech intelligibility when stating names of family members - 8 out of 9 opportunities when provided with visual articulatory cue - unable to faded cues  Pt reliant on written cues to supplement verbal questions about his day and meals - pt perseverative on "fried chicken" with no awareness of semantic paraphasia - but when prompted he wrote down correct information "hotdog" also recommended to pt's son that pt use text to speech app to list his topping   His son also states that pt is using the app (Text to Speech) but he  is showing them the written information he is not playing the stimulus and then repeating it. In an effort to facilitate habit of typing, playing then speaking, SLP required pt to list 5 items within the following categories - colors, parts of a car, body parts, things you put in a garage, desserts, things to find in a woman's purse, mean animals and things that are salty.   Pt required cue x 2 to play then speak the word - able to repeat with 100% speech intelligibility     PATIENT EDUCATION: Education details: see above Person educated: Patient and son Education method: Explanation Education comprehension: verbalized understanding and needs further education   HOME EXERCISE PROGRAM:  Talk Path Therapy tasks     GOALS:   Goals reviewed with patient? Yes   SHORT TERM GOALS: Target date: 10 sessions   UPDATED GOALS: 09/12/2022 The patient will name common household objects at 80% accuracy given frequent maximum verbal and frequent maximum phonemic cues. Goal status: INITIAL Goal Status: MET for names of family members Goal Updated: With moderate assistance, pt will name common household objects at 80% accuracy.  2.  The patient will identify the correct word given 2 choices at 80% accuracy given frequent maximal visual cues in order to increase ability to comprehend simple instructions. Goal status: INITIAL Goal status: Ongoing   3.  The patient will follow simple body commands presented auditorily at 80% accuracy given frequent maximal visual cues. Goal status: INITIAL Goal status: Ongoing   4.  The patient will say functional words (e.g., water, toilet) at 50% accuracy given frequent maximal phonemic placement cues in order to communicate ability to communicate basic wants and needs. Goal status: INITIAL Goal status: MET Goal updated: The patient will say functional words (e.g., water, toilet) at 75% accuracy given frequent maximal phonemic placement cues in order to  communicate ability to communicate basic wants and needs.     LONG TERM GOALS: Target date: 11/17/2022   Updated: 09/12/2022 Pt will use multimodal means of communication to express basic biographical information about himself with moderate assistance.  Baseline:  Goal status: INITIAL Goal status: ONGOING    2.  Pt will demonstrate > 90% speech intelligibility when reading orientation information.  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 Pt's family continues to report increased expressive abilities at home. Instructed pt and his son to have patient play and repeat what he types into Text to speech app. This is also observed within our treatment sessions. See the treatment note above for more details.    OBJECTIVE IMPAIRMENTS include expressive language, receptive language, apraxia, and reading comprehension and ability to produce written language . These impairments are limiting patient from return to work, managing medications, managing appointments, managing finances, household responsibilities, ADLs/IADLs, and effectively communicating at home and in community. Factors affecting potential to achieve goals and functional outcome are severity of impairments. Patient will benefit from skilled SLP services to address above impairments and improve overall function.   REHAB POTENTIAL: Excellent   PLAN: SLP FREQUENCY: 3x/week to 4xweek   SLP DURATION: 12 weeks   PLANNED INTERVENTIONS: Language facilitation, Cueing hierachy, Functional tasks, Multimodal communication approach, SLP instruction and feedback, Compensatory strategies, and Patient/family education       Terris Bodin B. Dreama Saa, M.S., CCC-SLP, Tree surgeon Certified Brain Injury Specialist [facility]  [facility] Rehabilitation Services Office [phone] Ascom [phone] Fax [phone]

## 2022-10-12 ENCOUNTER — Ambulatory Visit: Payer: BC Managed Care – PPO | Admitting: Speech Pathology

## 2022-10-12 DIAGNOSIS — R4701 Aphasia: Secondary | ICD-10-CM | POA: Diagnosis not present

## 2022-10-12 DIAGNOSIS — R482 Apraxia: Secondary | ICD-10-CM

## 2022-10-12 DIAGNOSIS — I63512 Cerebral infarction due to unspecified occlusion or stenosis of left middle cerebral artery: Secondary | ICD-10-CM

## 2022-10-12 NOTE — Therapy (Signed)
OUTPATIENT SPEECH LANGUAGE PATHOLOGY  TREATMENT NOTE     Patient Name: [name] MRN: [id number] DOB:[birth year], 57 y.o., male Today's Date: 10/12/2022   PCP: Myrene Buddy, NP REFERRING PROVIDER: Mariam Dollar, PA   End of Session - 10/12/22 1354     Visit Number 38    Number of Visits 49    Date for SLP Re-Evaluation 11/17/22    Authorization Type [employer]    Progress Note Due on Visit 30    SLP Start Time 1315    SLP Stop Time  1400    SLP Time Calculation (min) 45 min    Activity Tolerance Patient tolerated treatment well              Past Medical History:  Diagnosis Date   Hypertension    LVH (left ventricular hypertrophy)    Myocardial infarction (HCC)    Vitamin D deficiency    Past Surgical History:  Procedure Laterality Date   ANTERIOR CRUCIATE LIGAMENT REPAIR     COLONOSCOPY WITH PROPOFOL N/A 04/11/2018   Procedure: COLONOSCOPY WITH PROPOFOL;  Surgeon: Toledo, Boykin Nearing, MD;  Location: [facility] ENDOSCOPY;  Service: Gastroenterology;  Laterality: N/A;   GASTRIC RESECTION     IR ANGIO INTRA EXTRACRAN SEL INTERNAL CAROTID UNI L MOD SED  08/15/2022   IR US GUIDE VASC ACCESS RIGHT  08/15/2022   LAPAROTOMY     LOOP RECORDER INSERTION N/A 08/18/2022   Procedure: LOOP RECORDER INSERTION;  Surgeon: Regan Lemming, MD;  Location: [facility] INVASIVE CV LAB;  Service: Cardiovascular;  Laterality: N/A;   RADIOLOGY WITH ANESTHESIA N/A 08/14/2022   Procedure: IR WITH ANESTHESIA;  Surgeon: Julieanne Cotton, MD;  Location: [facility] OR;  Service: Radiology;  Laterality: N/A;   TEE WITHOUT CARDIOVERSION N/A 08/18/2022   Procedure: TRANSESOPHAGEAL ECHOCARDIOGRAM;  Surgeon: [name], MD;  Location: [facility] INVASIVE CV LAB;  Service: Cardiovascular;  Laterality: N/A;   Patient Active Problem List   Diagnosis Date Noted   Essential hypertension, benign 08/18/2022   Hyperlipidemia 08/18/2022   DM (diabetes mellitus), type 2 (HCC) 08/18/2022   Obesity  (BMI 30-39.9) 08/18/2022   CAD (coronary artery disease) 08/18/2022   OSA (obstructive sleep apnea) 08/18/2022   Acute ischemic left MCA stroke (HCC) 08/18/2022   Acute ischemic right MCA stroke (HCC) 08/14/2022   Hypertensive urgency 04/27/2017   ONSET DATE: 08/14/2022; date of referral 08/23/2022    REFERRING DIAG: W09.811 (ICD-10-CM) - Cerebral infarction due to unspecified occlusion or stenosis of left middle cerebral artery   THERAPY DIAG:  Aphasia   Apraxia   Cerebral infarction due to unspecified occlusion or stenosis of left middle cerebral artery (HCC)   Rationale for Evaluation and Treatment Rehabilitation   SUBJECTIVE:      PERTINENT HISTORY:  [name] is 57 year old male who presented to the [facility] ED on 08/14/2022 noted by wife to be very weak and confused and halfway enter out of the bathtub at home. Symptoms were sudden in onset. Last known well 9 PM. Patient presented with global aphasia. Code stroke activated. CT head negative for hemorrhage, aspects of 9, mild hyperdensity left MCA could indicate thrombus. Tenecteplase administered. CTA showed left M1 occlusion and neurointerventional radiology contacted. Patient transferred to [facility] for thrombectomy. On route he was noted to have periorbital welts and uvular swelling. Was concern of possible angioedema secondary to tenecteplase. He was intubated for airway protection and underwent cerebral angiogram by Dr. Tommie Sams. Recanalization of the  left MCA vascular tree noted. Critical care medicine consulted for management and placed on low-dose Neo-Synephrine infusion. Extubated on 4/23. 2D echo with EF of approximately 60 to 65% with trivial MVR. LDL 96, hemoglobin A1c 6.3%. He was started on heparin subcutaneously for VTE prophylaxis.     DIAGNOSTIC FINDINGS:    CT Head - 08/14/2022 Mild hyperdensity of the left MCA   CT Head and Neck Angio - 08/14/2022 Proximal occlusion of the left MCA M1 segment  with poor collateralization throughout the MCA territory.   IR Angio - 08/15/2022 Interval recanalization of the left M1/MCA occlusion seen on prior CT angiogram likely as a result of TNK administration. Therefore, no intervention performed.   MRI 08/15/2022 Acute cortical infarcts along the left frontal operculum and left anterior insula with additional small area of acute cortical infarct within left parietal lobe. Other areas of faint cortical DWI hyperintensity throughout the left MCA territory, including the left putamen, may reflect additional areas of infarct or ischemia. 2. Loss of the left transverse sinus flow void is favored to reflect slow flow. If clinical suspicion for thrombosis, CT venography could be performed for further characterization.     PAIN:  Are you having pain? No   FALLS: Has patient fallen in last 6 months?  No   LIVING ENVIRONMENT: Lives with: lives with their family Lives in: House/apartment   PLOF:  Level of assistance: Independent with ADLs, Independent with IADLs Employment: Full-time employment     PATIENT GOALS    to be able to communicate again   SUBJECTIVE STATEMENT: Pt surprised by ability to sing along with several songs Pt accompanied by: pt's wife   OBJECTIVE:   TODAY'S TREATMENT:  Skilled treatment session focused on pt's communication goals. SLP facilitated session by providing the following interventions:  Speech intelligibility when stating names of family members - 8 out of 9 opportunities when provided with visual articulatory cue - unable to faded cues - continued decreased awareness of errors, difficulty receptively identifying errors  Given semi-complex categories, pt able to list 5 items within each category  With encouragement, pt attempted singing along with several R&B artists. Pt surprised himself by intelligibly singing to >50-75% of song.     PATIENT EDUCATION: Education details: see above Person educated:  Patient and son Education method: Explanation Education comprehension: verbalized understanding and needs further education   HOME EXERCISE PROGRAM:  Talk Path Therapy tasks As well as singing to R&B Song     GOALS:   Goals reviewed with patient? Yes   SHORT TERM GOALS: Target date: 10 sessions   UPDATED GOALS: 09/12/2022 The patient will name common household objects at 80% accuracy given frequent maximum verbal and frequent maximum phonemic cues. Goal status: INITIAL Goal Status: MET for names of family members Goal Updated: With moderate assistance, pt will name common household objects at 80% accuracy.    2.  The patient will identify the correct word given 2 choices at 80% accuracy given frequent maximal visual cues in order to increase ability to comprehend simple instructions. Goal status: INITIAL Goal status: Ongoing   3.  The patient will follow simple body commands presented auditorily at 80% accuracy given frequent maximal visual cues. Goal status: INITIAL Goal status: Ongoing   4.  The patient will say functional words (e.g., water, toilet) at 50% accuracy given frequent maximal phonemic placement cues in order to communicate ability to communicate basic wants and needs. Goal status: INITIAL Goal status: MET Goal updated: The  patient will say functional words (e.g., water, toilet) at 75% accuracy given frequent maximal phonemic placement cues in order to communicate ability to communicate basic wants and needs.     LONG TERM GOALS: Target date: 11/17/2022   Updated: 09/12/2022 Pt will use multimodal means of communication to express basic biographical information about himself with moderate assistance.  Baseline:  Goal status: INITIAL Goal status: ONGOING    2.  Pt will demonstrate > 90% speech intelligibility when reading orientation information.  Baseline:  Goal status: INITIAL Goal status: ONGOING     ASSESSMENT:   CLINICAL IMPRESSION: Pt is a 57 year  old right handed male who was seen today for a speech-language treatment d/t moderate transcortical sensory aphasia. Pt with newly found ability to sing along to familiar songs. See treatment note for more details.   OBJECTIVE IMPAIRMENTS include expressive language, receptive language, apraxia, and reading comprehension and ability to produce written language . These impairments are limiting patient from return to work, managing medications, managing appointments, managing finances, household responsibilities, ADLs/IADLs, and effectively communicating at home and in community. Factors affecting potential to achieve goals and functional outcome are severity of impairments. Patient will benefit from skilled SLP services to address above impairments and improve overall function.   REHAB POTENTIAL: Excellent   PLAN: SLP FREQUENCY: 3x/week to 4xweek   SLP DURATION: 12 weeks   PLANNED INTERVENTIONS: Language facilitation, Cueing hierachy, Functional tasks, Multimodal communication approach, SLP instruction and feedback, Compensatory strategies, and Patient/family education       Mable Dara B. Dreama Saa, M.S., CCC-SLP, Tree surgeon Certified Brain Injury Specialist [facility]  [facility] Rehabilitation Services Office [phone] Ascom [phone] Fax [phone]

## 2022-10-13 ENCOUNTER — Ambulatory Visit: Payer: BC Managed Care – PPO | Admitting: Speech Pathology

## 2022-10-13 DIAGNOSIS — R4701 Aphasia: Secondary | ICD-10-CM

## 2022-10-13 DIAGNOSIS — I63512 Cerebral infarction due to unspecified occlusion or stenosis of left middle cerebral artery: Secondary | ICD-10-CM

## 2022-10-13 DIAGNOSIS — R482 Apraxia: Secondary | ICD-10-CM

## 2022-10-13 NOTE — Therapy (Signed)
OUTPATIENT SPEECH LANGUAGE PATHOLOGY  TREATMENT NOTE     Patient Name: [name] MRN: [id number] DOB:Oct 24, 1965, 57 y.o., male Today's Date: 10/13/2022   PCP: Jon Buddy, NP REFERRING PROVIDER: Mariam Dollar, PA   End of Session - 10/13/22 1337     Visit Number 39    Number of Visits 49    Date for SLP Re-Evaluation 11/17/22    Authorization Type [employer]    Progress Note Due on Visit 30    SLP Start Time 1315    SLP Stop Time  1400    SLP Time Calculation (min) 45 min    Activity Tolerance Patient tolerated treatment well              Past Medical History:  Diagnosis Date   Hypertension    LVH (left ventricular hypertrophy)    Myocardial infarction (HCC)    Vitamin D deficiency    Past Surgical History:  Procedure Laterality Date   ANTERIOR CRUCIATE LIGAMENT REPAIR     COLONOSCOPY WITH PROPOFOL N/A 04/11/2018   Procedure: COLONOSCOPY WITH PROPOFOL;  Surgeon: Toledo, Boykin Nearing, MD;  Location: [facility] ENDOSCOPY;  Service: Gastroenterology;  Laterality: N/A;   GASTRIC RESECTION     IR ANGIO INTRA EXTRACRAN SEL INTERNAL CAROTID UNI L MOD SED  08/15/2022   IR US GUIDE VASC ACCESS RIGHT  08/15/2022   LAPAROTOMY     LOOP RECORDER INSERTION N/A 08/18/2022   Procedure: LOOP RECORDER INSERTION;  Surgeon: Regan Lemming, MD;  Location: [facility] INVASIVE CV LAB;  Service: Cardiovascular;  Laterality: N/A;   RADIOLOGY WITH ANESTHESIA N/A 08/14/2022   Procedure: IR WITH ANESTHESIA;  Surgeon: Julieanne Cotton, MD;  Location: [facility] OR;  Service: Radiology;  Laterality: N/A;   TEE WITHOUT CARDIOVERSION N/A 08/18/2022   Procedure: TRANSESOPHAGEAL ECHOCARDIOGRAM;  Surgeon: [name], MD;  Location: [facility] INVASIVE CV LAB;  Service: Cardiovascular;  Laterality: N/A;   Patient Active Problem List   Diagnosis Date Noted   Essential hypertension, benign 08/18/2022   Hyperlipidemia 08/18/2022   DM (diabetes mellitus), type 2 (HCC) 08/18/2022   Obesity  (BMI 30-39.9) 08/18/2022   CAD (coronary artery disease) 08/18/2022   OSA (obstructive sleep apnea) 08/18/2022   Acute ischemic left MCA stroke (HCC) 08/18/2022   Acute ischemic right MCA stroke (HCC) 08/14/2022   Hypertensive urgency 04/27/2017   ONSET DATE: 08/14/2022; date of referral 08/23/2022    REFERRING DIAG: W09.811 (ICD-10-CM) - Cerebral infarction due to unspecified occlusion or stenosis of left middle cerebral artery   THERAPY DIAG:  Aphasia   Apraxia   Cerebral infarction due to unspecified occlusion or stenosis of left middle cerebral artery (HCC)   Rationale for Evaluation and Treatment Rehabilitation   SUBJECTIVE:      PERTINENT HISTORY:  [name] is 57 year old male who presented to the [facility] ED on 08/14/2022 noted by wife to be very weak and confused and halfway enter out of the bathtub at home. Symptoms were sudden in onset. Last known well 9 PM. Patient presented with global aphasia. Code stroke activated. CT head negative for hemorrhage, aspects of 9, mild hyperdensity left MCA could indicate thrombus. Tenecteplase administered. CTA showed left M1 occlusion and neurointerventional radiology contacted. Patient transferred to [facility] for thrombectomy. On route he was noted to have periorbital welts and uvular swelling. Was concern of possible angioedema secondary to tenecteplase. He was intubated for airway protection and underwent cerebral angiogram by Dr. Tommie Sams. Recanalization of the  left MCA vascular tree noted. Critical care medicine consulted for management and placed on low-dose Neo-Synephrine infusion. Extubated on 4/23. 2D echo with EF of approximately 60 to 65% with trivial MVR. LDL 96, hemoglobin A1c 6.3%. He was started on heparin subcutaneously for VTE prophylaxis.     DIAGNOSTIC FINDINGS:    CT Head - 08/14/2022 Mild hyperdensity of the left MCA   CT Head and Neck Angio - 08/14/2022 Proximal occlusion of the left MCA M1 segment  with poor collateralization throughout the MCA territory.   IR Angio - 08/15/2022 Interval recanalization of the left M1/MCA occlusion seen on prior CT angiogram likely as a result of TNK administration. Therefore, no intervention performed.   MRI 08/15/2022 Acute cortical infarcts along the left frontal operculum and left anterior insula with additional small area of acute cortical infarct within left parietal lobe. Other areas of faint cortical DWI hyperintensity throughout the left MCA territory, including the left putamen, may reflect additional areas of infarct or ischemia. 2. Loss of the left transverse sinus flow void is favored to reflect slow flow. If clinical suspicion for thrombosis, CT venography could be performed for further characterization.     PAIN:  Are you having pain? No   FALLS: Has patient fallen in last 6 months?  No   LIVING ENVIRONMENT: Lives with: lives with their family Lives in: House/apartment   PLOF:  Level of assistance: Independent with ADLs, Independent with IADLs Employment: Full-time employment     PATIENT GOALS    to be able to communicate again   SUBJECTIVE STATEMENT:  Son reports pt and he sang on the way to therapy Pt accompanied by: pt's sons, [name] and [name]   OBJECTIVE:   TODAY'S TREATMENT:  Skilled treatment session focused on pt's communication goals. SLP facilitated session by providing the following interventions:  [facility] 1: Aphasia Rehab - Unit 3 Vocabulary (synonyms) p. 67 - moderate faded to minimal A to achieve 90% (when selecting synonyms from field of 3)    PATIENT EDUCATION: Education details: see above Person educated: Patient and son Education method: Explanation Education comprehension: verbalized understanding and needs further education   HOME EXERCISE PROGRAM:  Talk Path Therapy tasks As well as singing to R&B Song     GOALS:   Goals reviewed with patient? Yes   SHORT TERM GOALS: Target date: 10  sessions   UPDATED GOALS: 09/12/2022 The patient will name common household objects at 80% accuracy given frequent maximum verbal and frequent maximum phonemic cues. Goal status: INITIAL Goal Status: MET for names of family members Goal Updated: With moderate assistance, pt will name common household objects at 80% accuracy.    2.  The patient will identify the correct word given 2 choices at 80% accuracy given frequent maximal visual cues in order to increase ability to comprehend simple instructions. Goal status: INITIAL Goal status: Ongoing   3.  The patient will follow simple body commands presented auditorily at 80% accuracy given frequent maximal visual cues. Goal status: INITIAL Goal status: Ongoing   4.  The patient will say functional words (e.g., water, toilet) at 50% accuracy given frequent maximal phonemic placement cues in order to communicate ability to communicate basic wants and needs. Goal status: INITIAL Goal status: MET Goal updated: The patient will say functional words (e.g., water, toilet) at 75% accuracy given frequent maximal phonemic placement cues in order to communicate ability to communicate basic wants and needs.     LONG TERM GOALS: Target date: 11/17/2022  Updated: 09/12/2022 Pt will use multimodal means of communication to express basic biographical information about himself with moderate assistance.  Baseline:  Goal status: INITIAL Goal status: ONGOING    2.  Pt will demonstrate > 90% speech intelligibility when reading orientation information.  Baseline:  Goal status: INITIAL Goal status: ONGOING     ASSESSMENT:   CLINICAL IMPRESSION: Pt is a 57 year old right handed male who was seen today for a speech-language treatment d/t moderate transcortical sensory aphasia. Pt with newly found ability to sing along to familiar songs. See treatment note for more details.   OBJECTIVE IMPAIRMENTS include expressive language, receptive language, apraxia,  and reading comprehension and ability to produce written language . These impairments are limiting patient from return to work, managing medications, managing appointments, managing finances, household responsibilities, ADLs/IADLs, and effectively communicating at home and in community. Factors affecting potential to achieve goals and functional outcome are severity of impairments. Patient will benefit from skilled SLP services to address above impairments and improve overall function.   REHAB POTENTIAL: Excellent   PLAN: SLP FREQUENCY: 3x/week to 4xweek   SLP DURATION: 12 weeks   PLANNED INTERVENTIONS: Language facilitation, Cueing hierachy, Functional tasks, Multimodal communication approach, SLP instruction and feedback, Compensatory strategies, and Patient/family education       Kayliah Tindol B. Dreama Saa, M.S., CCC-SLP, Tree surgeon Certified Brain Injury Specialist [facility]  [facility] Rehabilitation Services Office [phone] Ascom [phone] Fax [phone]

## 2022-10-17 ENCOUNTER — Ambulatory Visit: Payer: BC Managed Care – PPO | Admitting: Speech Pathology

## 2022-10-17 DIAGNOSIS — R4701 Aphasia: Secondary | ICD-10-CM | POA: Diagnosis not present

## 2022-10-17 DIAGNOSIS — I63512 Cerebral infarction due to unspecified occlusion or stenosis of left middle cerebral artery: Secondary | ICD-10-CM

## 2022-10-17 DIAGNOSIS — R482 Apraxia: Secondary | ICD-10-CM

## 2022-10-17 NOTE — Therapy (Signed)
OUTPATIENT SPEECH LANGUAGE PATHOLOGY  TREATMENT NOTE 10th VISIT PROGRESS NOTE     Patient Name: [name] MRN: [id number] DOB:1965/05/28, 57 y.o., male Today's Date: 10/17/2022   PCP: Jon Buddy, NP REFERRING PROVIDER: Mariam Dollar, PA  Speech Therapy Progress Note  Dates of Reporting Period: 09/29/2022 to 10/17/2022  Objective: Patient has been seen for 10 speech therapy sessions this reporting period targeting pt's MODERATE TRANSCORTICAL SENSORY APHASIA.Marland Kitchen Patient is making progress toward LTGs and met 1 STGs this reporting period. See skilled intervention, clinical impressions, and goals below for details.    End of Session - 10/17/22 1451     Visit Number 40    Number of Visits 49    Date for SLP Re-Evaluation 11/17/22    Authorization Type [employer]    Progress Note Due on Visit 30    SLP Start Time 1315    SLP Stop Time  1400    SLP Time Calculation (min) 45 min    Activity Tolerance Patient tolerated treatment well              Past Medical History:  Diagnosis Date   Hypertension    LVH (left ventricular hypertrophy)    Myocardial infarction (HCC)    Vitamin D deficiency    Past Surgical History:  Procedure Laterality Date   ANTERIOR CRUCIATE LIGAMENT REPAIR     COLONOSCOPY WITH PROPOFOL N/A 04/11/2018   Procedure: COLONOSCOPY WITH PROPOFOL;  Surgeon: Toledo, Boykin Nearing, MD;  Location: [facility] ENDOSCOPY;  Service: Gastroenterology;  Laterality: N/A;   GASTRIC RESECTION     IR ANGIO INTRA EXTRACRAN SEL INTERNAL CAROTID UNI L MOD SED  08/15/2022   IR US GUIDE VASC ACCESS RIGHT  08/15/2022   LAPAROTOMY     LOOP RECORDER INSERTION N/A 08/18/2022   Procedure: LOOP RECORDER INSERTION;  Surgeon: Regan Lemming, MD;  Location: [facility] INVASIVE CV LAB;  Service: Cardiovascular;  Laterality: N/A;   RADIOLOGY WITH ANESTHESIA N/A 08/14/2022   Procedure: IR WITH ANESTHESIA;  Surgeon: Julieanne Cotton, MD;  Location: [facility] OR;  Service:  Radiology;  Laterality: N/A;   TEE WITHOUT CARDIOVERSION N/A 08/18/2022   Procedure: TRANSESOPHAGEAL ECHOCARDIOGRAM;  Surgeon: [name], MD;  [facility] INVASIVE CV LAB;  Service: Cardiovascular;  Laterality: N/A;   Patient Active Problem List   Diagnosis Date Noted   Essential hypertension, benign 08/18/2022   Hyperlipidemia 08/18/2022   DM (diabetes mellitus), type 2 (HCC) 08/18/2022   Obesity (BMI 30-39.9) 08/18/2022   CAD (coronary artery disease) 08/18/2022   OSA (obstructive sleep apnea) 08/18/2022   Acute ischemic left MCA stroke (HCC) 08/18/2022   Acute ischemic right MCA stroke (HCC) 08/14/2022   Hypertensive urgency 04/27/2017   ONSET DATE: 08/14/2022; date of referral 08/23/2022    REFERRING DIAG: V42.595 (ICD-10-CM) - Cerebral infarction due to unspecified occlusion or stenosis of left middle cerebral artery   THERAPY DIAG:  Aphasia   Apraxia   Cerebral infarction due to unspecified occlusion or stenosis of left middle cerebral artery (HCC)   Rationale for Evaluation and Treatment Rehabilitation   SUBJECTIVE:      PERTINENT HISTORY:  [name] is 57 year old male who presented to the [facility] ED on 08/14/2022 noted by wife to be very weak and confused and halfway enter out of the bathtub at home. Symptoms were sudden in onset. Last known well 9 PM. Patient presented with global aphasia. Code stroke activated. CT head negative for hemorrhage, aspects of 9, mild hyperdensity  left MCA could indicate thrombus. Tenecteplase administered. CTA showed left M1 occlusion and neurointerventional radiology contacted. Patient transferred to [facility] for thrombectomy. On route he was noted to have periorbital welts and uvular swelling. Was concern of possible angioedema secondary to tenecteplase. He was intubated for airway protection and underwent cerebral angiogram by Dr. Tommie Sams. Recanalization of the left MCA vascular tree noted. Critical care medicine  consulted for management and placed on low-dose Neo-Synephrine infusion. Extubated on 4/23. 2D echo with EF of approximately 60 to 65% with trivial MVR. LDL 96, hemoglobin A1c 6.3%. He was started on heparin subcutaneously for VTE prophylaxis.     DIAGNOSTIC FINDINGS:    CT Head - 08/14/2022 Mild hyperdensity of the left MCA   CT Head and Neck Angio - 08/14/2022 Proximal occlusion of the left MCA M1 segment with poor collateralization throughout the MCA territory.   IR Angio - 08/15/2022 Interval recanalization of the left M1/MCA occlusion seen on prior CT angiogram likely as a result of TNK administration. Therefore, no intervention performed.   MRI 08/15/2022 Acute cortical infarcts along the left frontal operculum and left anterior insula with additional small area of acute cortical infarct within left parietal lobe. Other areas of faint cortical DWI hyperintensity throughout the left MCA territory, including the left putamen, may reflect additional areas of infarct or ischemia. 2. Loss of the left transverse sinus flow void is favored to reflect slow flow. If clinical suspicion for thrombosis, CT venography could be performed for further characterization.     PAIN:  Are you having pain? No   FALLS: Has patient fallen in last 6 months?  No   LIVING ENVIRONMENT: Lives with: lives with their family Lives in: House/apartment   PLOF:  Level of assistance: Independent with ADLs, Independent with IADLs Employment: Full-time employment     PATIENT GOALS    to be able to communicate again   SUBJECTIVE STATEMENT:  Son reports pt and he sang on the way to therapy Pt accompanied by: pt's sons, [name] and [name]   OBJECTIVE:   TODAY'S TREATMENT:  Skilled treatment session focused on pt's communication goals. SLP facilitated session by providing the following interventions:  With maximal multimodal assistance (including written sentences) pt able to generate synonyms. After this  instruction, pt able to pair synonym with it's pair in 8 out of 10 opportunities.   To build awareness of verbal errors, written names were paired with each family member's names. Pt able to select appropriate family member's name from written choices with increased awareness demonstrated by putting the written name that pt said. While pt demonstrated increased awareness of errors, he was not able to break perseverative response.     PATIENT EDUCATION: Education details: see above Person educated: Patient and son Education method: Explanation Education comprehension: verbalized understanding and needs further education   HOME EXERCISE PROGRAM:  Talk Path Therapy tasks As well as singing to R&B Song     GOALS:   Goals reviewed with patient? Yes   SHORT TERM GOALS: Target date: 10 sessions   UPDATED GOALS: 09/12/2022 UPDATED GOALS: 10/17/2022 The patient will name common household objects at 80% accuracy given frequent maximum verbal and frequent maximum phonemic cues. Goal status: INITIAL Goal Status: MET for names of family members Goal Updated: With moderate assistance, pt will name common household objects at 80% accuracy. ONGOING   2.  The patient will identify the correct word given 2 choices at 80% accuracy given frequent maximal visual cues  in order to increase ability to comprehend simple instructions. Goal status: INITIAL Goal status: Ongoing; ONGOING   3.  The patient will follow simple body commands presented auditorily at 80% accuracy given frequent maximal visual cues. Goal status: INITIAL Goal status: Ongoing; ONGOING   4.  The patient will say functional words (e.g., water, toilet) at 50% accuracy given frequent maximal phonemic placement cues in order to communicate ability to communicate basic wants and needs. Goal status: INITIAL Goal status: MET Goal updated: The patient will say functional words (e.g., water, toilet) at 75% accuracy given frequent maximal  phonemic placement cues in order to communicate ability to communicate basic wants and needs. MET     LONG TERM GOALS: Target date: 11/17/2022   Updated: 09/12/2022  Updated: 10/17/2022 Pt will use multimodal means of communication to express basic biographical information about himself with moderate assistance.  Baseline:  Goal status: INITIAL Goal status: ONGOING; progress made    2.  Pt will demonstrate > 90% speech intelligibility when reading orientation information.  Baseline:  Goal status: INITIAL Goal status: ONGOING; progress made     ASSESSMENT:   CLINICAL IMPRESSION: Pt is a 57 year old right handed male who was seen today for a speech-language treatment d/t moderate transcortical sensory aphasia. Pt continues to make good progress. His son reports that pt "talks more now than he did before the stroke." See treatment note for more details.   OBJECTIVE IMPAIRMENTS include expressive language, receptive language, apraxia, and reading comprehension and ability to produce written language . These impairments are limiting patient from return to work, managing medications, managing appointments, managing finances, household responsibilities, ADLs/IADLs, and effectively communicating at home and in community. Factors affecting potential to achieve goals and functional outcome are severity of impairments. Patient will benefit from skilled SLP services to address above impairments and improve overall function.   REHAB POTENTIAL: Excellent   PLAN: SLP FREQUENCY: 3x/week to 4xweek   SLP DURATION: 12 weeks   PLANNED INTERVENTIONS: Language facilitation, Cueing hierachy, Functional tasks, Multimodal communication approach, SLP instruction and feedback, Compensatory strategies, and Patient/family education       Korinna Tat B. Dreama Saa, M.S., CCC-SLP, Tree surgeon Certified Brain Injury Specialist [facility]  [facility] Rehabilitation  Services Office [phone] Ascom [phone] Fax [phone]

## 2022-10-18 ENCOUNTER — Ambulatory Visit: Payer: BC Managed Care – PPO | Admitting: Speech Pathology

## 2022-10-18 DIAGNOSIS — R482 Apraxia: Secondary | ICD-10-CM

## 2022-10-18 DIAGNOSIS — R4701 Aphasia: Secondary | ICD-10-CM

## 2022-10-18 DIAGNOSIS — I63512 Cerebral infarction due to unspecified occlusion or stenosis of left middle cerebral artery: Secondary | ICD-10-CM

## 2022-10-18 NOTE — Therapy (Signed)
OUTPATIENT SPEECH LANGUAGE PATHOLOGY  TREATMENT NOTE     Patient Name: [name] MRN: [id number] DOB:[birth year], 57 y.o., male Today's Date: 10/18/2022   PCP: Myrene Buddy, NP REFERRING PROVIDER: Mariam Dollar, PA     End of Session - 10/18/22 1121     Visit Number 41    Number of Visits 49    Date for SLP Re-Evaluation 11/17/22    Authorization Type [employer]    Progress Note Due on Visit 50    SLP Start Time 0845    SLP Stop Time  0930    SLP Time Calculation (min) 45 min    Activity Tolerance Patient tolerated treatment well              Past Medical History:  Diagnosis Date   Hypertension    LVH (left ventricular hypertrophy)    Myocardial infarction (HCC)    Vitamin D deficiency    Past Surgical History:  Procedure Laterality Date   ANTERIOR CRUCIATE LIGAMENT REPAIR     COLONOSCOPY WITH PROPOFOL N/A 04/11/2018   Procedure: COLONOSCOPY WITH PROPOFOL;  Surgeon: Toledo, Boykin Nearing, MD;  Location: [facility] ENDOSCOPY;  Service: Gastroenterology;  Laterality: N/A;   GASTRIC RESECTION     IR ANGIO INTRA EXTRACRAN SEL INTERNAL CAROTID UNI L MOD SED  08/15/2022   IR US GUIDE VASC ACCESS RIGHT  08/15/2022   LAPAROTOMY     LOOP RECORDER INSERTION N/A 08/18/2022   Procedure: LOOP RECORDER INSERTION;  Surgeon: Regan Lemming, MD;  Location: [facility] INVASIVE CV LAB;  Service: Cardiovascular;  Laterality: N/A;   RADIOLOGY WITH ANESTHESIA N/A 08/14/2022   Procedure: IR WITH ANESTHESIA;  Surgeon: Julieanne Cotton, MD;  Location: [facility] OR;  Service: Radiology;  Laterality: N/A;   TEE WITHOUT CARDIOVERSION N/A 08/18/2022   Procedure: TRANSESOPHAGEAL ECHOCARDIOGRAM;  Surgeon: [name], MD;  Location: [facility] INVASIVE CV LAB;  Service: Cardiovascular;  Laterality: N/A;   Patient Active Problem List   Diagnosis Date Noted   Essential hypertension, benign 08/18/2022   Hyperlipidemia 08/18/2022   DM (diabetes mellitus), type 2 (HCC) 08/18/2022    Obesity (BMI 30-39.9) 08/18/2022   CAD (coronary artery disease) 08/18/2022   OSA (obstructive sleep apnea) 08/18/2022   Acute ischemic left MCA stroke (HCC) 08/18/2022   Acute ischemic right MCA stroke (HCC) 08/14/2022   Hypertensive urgency 04/27/2017   ONSET DATE: 08/14/2022; date of referral 08/23/2022    REFERRING DIAG: W09.811 (ICD-10-CM) - Cerebral infarction due to unspecified occlusion or stenosis of left middle cerebral artery   THERAPY DIAG:  Aphasia   Apraxia   Cerebral infarction due to unspecified occlusion or stenosis of left middle cerebral artery (HCC)   Rationale for Evaluation and Treatment Rehabilitation   SUBJECTIVE:      PERTINENT HISTORY:  [name] is 57 year old male who presented to the [facility] ED on 08/14/2022 noted by wife to be very weak and confused and halfway enter out of the bathtub at home. Symptoms were sudden in onset. Last known well 9 PM. Patient presented with global aphasia. Code stroke activated. CT head negative for hemorrhage, aspects of 9, mild hyperdensity left MCA could indicate thrombus. Tenecteplase administered. CTA showed left M1 occlusion and neurointerventional radiology contacted. Patient transferred to [facility] for thrombectomy. On route he was noted to have periorbital welts and uvular swelling. Was concern of possible angioedema secondary to tenecteplase. He was intubated for airway protection and underwent cerebral angiogram by Dr. Tommie Sams. Recanalization  of the left MCA vascular tree noted. Critical care medicine consulted for management and placed on low-dose Neo-Synephrine infusion. Extubated on 4/23. 2D echo with EF of approximately 60 to 65% with trivial MVR. LDL 96, hemoglobin A1c 6.3%. He was started on heparin subcutaneously for VTE prophylaxis.     DIAGNOSTIC FINDINGS:    CT Head - 08/14/2022 Mild hyperdensity of the left MCA   CT Head and Neck Angio - 08/14/2022 Proximal occlusion of the left MCA M1  segment with poor collateralization throughout the MCA territory.   IR Angio - 08/15/2022 Interval recanalization of the left M1/MCA occlusion seen on prior CT angiogram likely as a result of TNK administration. Therefore, no intervention performed.   MRI 08/15/2022 Acute cortical infarcts along the left frontal operculum and left anterior insula with additional small area of acute cortical infarct within left parietal lobe. Other areas of faint cortical DWI hyperintensity throughout the left MCA territory, including the left putamen, may reflect additional areas of infarct or ischemia. 2. Loss of the left transverse sinus flow void is favored to reflect slow flow. If clinical suspicion for thrombosis, CT venography could be performed for further characterization.     PAIN:  Are you having pain? No   FALLS: Has patient fallen in last 6 months?  No   LIVING ENVIRONMENT: Lives with: lives with their family Lives in: House/apartment   PLOF:  Level of assistance: Independent with ADLs, Independent with IADLs Employment: Full-time employment     PATIENT GOALS    to be able to communicate again   SUBJECTIVE STATEMENT:  Pt forgot to bring in his flashcard Pt accompanied by: pt's sons, [name] and [name]   OBJECTIVE:   TODAY'S TREATMENT:  Skilled treatment session focused on pt's communication goals. SLP facilitated session by providing the following interventions:   To build awareness of verbal errors, written names were paired with each family member's names. Pt able to select appropriate family member's name from written choices with increased awareness demonstrated by putting the written name that pt said. While pt demonstrated increased awareness of errors, he was not able to break perseverative response. Pt continues to be reliant on visual articulatory cues for initial motor movement. SLP instructed pt's son to look thru pictures with pt and to name people in the pictures having  pt repeat the correct motor movement 5 times for each pictures. Instructed to perform similar to basketball drills to insure motor learning.   SLP further facilitated session by providing moderate assistance to complete the list of objects with a similar item (p 34- taken from [employer]: Con[employer] Categories). 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      PATIENT EDUCATION: Education details: see above Person educated: Patient and son Education method: Explanation Education comprehension: verbalized understanding and needs further education   HOME EXERCISE PROGRAM:  Talk Path Therapy tasks As well as singing to R&B Song Drills to promote accurate motor movements     GOALS:   Goals reviewed with patient? Yes   SHORT TERM GOALS: Target date: 10 sessions   UPDATED GOALS: 09/12/2022 UPDATED GOALS: 10/17/2022 The patient will name common household objects at 80% accuracy given frequent maximum verbal and frequent maximum phonemic cues. Goal status: INITIAL Goal Status: MET for names of family members Goal Updated: With moderate assistance, pt will name common household objects at 80% accuracy. ONGOING   2.  The patient will identify the correct word given 2 choices  at 80% accuracy given frequent maximal visual cues in order to increase ability to comprehend simple instructions. Goal status: INITIAL Goal status: Ongoing; ONGOING   3.  The patient will follow simple body commands presented auditorily at 80% accuracy given frequent maximal visual cues. Goal status: INITIAL Goal status: Ongoing; ONGOING   4.  The patient will say functional words (e.g., water, toilet) at 50% accuracy given frequent maximal phonemic placement cues in order to communicate ability to communicate basic wants and needs. Goal status: INITIAL Goal status: MET Goal updated: The patient will say functional words (e.g., water, toilet) at 75% accuracy given frequent maximal  phonemic placement cues in order to communicate ability to communicate basic wants and needs. MET     LONG TERM GOALS: Target date: 11/17/2022   Updated: 09/12/2022  Updated: 10/17/2022 Pt will use multimodal means of communication to express basic biographical information about himself with moderate assistance.  Baseline:  Goal status: INITIAL Goal status: ONGOING; progress made    2.  Pt will demonstrate > 90% speech intelligibility when reading orientation information.  Baseline:  Goal status: INITIAL Goal status: ONGOING; progress made     ASSESSMENT:   CLINICAL IMPRESSION: Pt is a 57 year old right handed male who was seen today for a speech-language treatment d/t moderate transcortical sensory aphasia. Pt continues to make good progress. His son reports that pt "talks more now than he did before the stroke." See treatment note for more details.   OBJECTIVE IMPAIRMENTS include expressive language, receptive language, apraxia, and reading comprehension and ability to produce written language . These impairments are limiting patient from return to work, managing medications, managing appointments, managing finances, household responsibilities, ADLs/IADLs, and effectively communicating at home and in community. Factors affecting potential to achieve goals and functional outcome are severity of impairments. Patient will benefit from skilled SLP services to address above impairments and improve overall function.   REHAB POTENTIAL: Excellent   PLAN: SLP FREQUENCY: 3x/week to 4xweek   SLP DURATION: 12 weeks   PLANNED INTERVENTIONS: Language facilitation, Cueing hierachy, Functional tasks, Multimodal communication approach, SLP instruction and feedback, Compensatory strategies, and Patient/family education       Theone Bowell B. Dreama Saa, M.S., CCC-SLP, Tree surgeon Certified Brain Injury Specialist [facility]  [facility] Rehabilitation  Services Office [phone] Ascom [phone] Fax [phone]

## 2022-10-18 NOTE — Progress Notes (Signed)
Carelink Summary Report / Loop Recorder 

## 2022-10-19 ENCOUNTER — Ambulatory Visit: Payer: BC Managed Care – PPO | Admitting: Speech Pathology

## 2022-10-19 DIAGNOSIS — R4701 Aphasia: Secondary | ICD-10-CM

## 2022-10-19 DIAGNOSIS — R482 Apraxia: Secondary | ICD-10-CM

## 2022-10-19 DIAGNOSIS — I63512 Cerebral infarction due to unspecified occlusion or stenosis of left middle cerebral artery: Secondary | ICD-10-CM

## 2022-10-20 ENCOUNTER — Ambulatory Visit: Payer: BC Managed Care – PPO | Admitting: Speech Pathology

## 2022-10-20 DIAGNOSIS — R4701 Aphasia: Secondary | ICD-10-CM | POA: Diagnosis not present

## 2022-10-20 DIAGNOSIS — R482 Apraxia: Secondary | ICD-10-CM

## 2022-10-20 DIAGNOSIS — I63512 Cerebral infarction due to unspecified occlusion or stenosis of left middle cerebral artery: Secondary | ICD-10-CM

## 2022-10-20 NOTE — Therapy (Signed)
OUTPATIENT SPEECH LANGUAGE PATHOLOGY  TREATMENT NOTE     Patient Name: [name] MRN: [id number] DOB:1966-03-28, 57 y.o., male Today's Date: 10/20/2022   PCP: Myrene Buddy, NP REFERRING PROVIDER: Mariam Dollar, PA     End of Session - 10/20/22 1409     Visit Number 43    Number of Visits 49    Date for SLP Re-Evaluation 11/17/22    Authorization Type [employer]    Progress Note Due on Visit 50    SLP Start Time 1400    SLP Stop Time  1445    SLP Time Calculation (min) 45 min    Activity Tolerance Patient tolerated treatment well              Past Medical History:  Diagnosis Date   Hypertension    LVH (left ventricular hypertrophy)    Myocardial infarction (HCC)    Vitamin D deficiency    Past Surgical History:  Procedure Laterality Date   ANTERIOR CRUCIATE LIGAMENT REPAIR     COLONOSCOPY WITH PROPOFOL N/A 04/11/2018   Procedure: COLONOSCOPY WITH PROPOFOL;  Surgeon: Toledo, Boykin Nearing, MD;  Location: [facility] ENDOSCOPY;  Service: Gastroenterology;  Laterality: N/A;   GASTRIC RESECTION     IR ANGIO INTRA EXTRACRAN SEL INTERNAL CAROTID UNI L MOD SED  08/15/2022   IR US GUIDE VASC ACCESS RIGHT  08/15/2022   LAPAROTOMY     LOOP RECORDER INSERTION N/A 08/18/2022   Procedure: LOOP RECORDER INSERTION;  Surgeon: Regan Lemming, MD;  Location: [facility] INVASIVE CV LAB;  Service: Cardiovascular;  Laterality: N/A;   RADIOLOGY WITH ANESTHESIA N/A 08/14/2022   Procedure: IR WITH ANESTHESIA;  Surgeon: Julieanne Cotton, MD;  Location: [facility] OR;  Service: Radiology;  Laterality: N/A;   TEE WITHOUT CARDIOVERSION N/A 08/18/2022   Procedure: TRANSESOPHAGEAL ECHOCARDIOGRAM;  Surgeon: [name], MD;  Location: [facility] INVASIVE CV LAB;  Service: Cardiovascular;  Laterality: N/A;   Patient Active Problem List   Diagnosis Date Noted   Essential hypertension, benign 08/18/2022   Hyperlipidemia 08/18/2022   DM (diabetes mellitus), type 2 (HCC) 08/18/2022    Obesity (BMI 30-39.9) 08/18/2022   CAD (coronary artery disease) 08/18/2022   OSA (obstructive sleep apnea) 08/18/2022   Acute ischemic left MCA stroke (HCC) 08/18/2022   Acute ischemic right MCA stroke (HCC) 08/14/2022   Hypertensive urgency 04/27/2017   ONSET DATE: 08/14/2022; date of referral 08/23/2022    REFERRING DIAG: W09.811 (ICD-10-CM) - Cerebral infarction due to unspecified occlusion or stenosis of left middle cerebral artery   THERAPY DIAG:  Aphasia   Apraxia   Cerebral infarction due to unspecified occlusion or stenosis of left middle cerebral artery (HCC)   Rationale for Evaluation and Treatment Rehabilitation   SUBJECTIVE:      PERTINENT HISTORY:  [name] is 57 year old male who presented to the [facility] ED on 08/14/2022 noted by wife to be very weak and confused and halfway enter out of the bathtub at home. Symptoms were sudden in onset. Last known well 9 PM. Patient presented with global aphasia. Code stroke activated. CT head negative for hemorrhage, aspects of 9, mild hyperdensity left MCA could indicate thrombus. Tenecteplase administered. CTA showed left M1 occlusion and neurointerventional radiology contacted. Patient transferred to [facility] for thrombectomy. On route he was noted to have periorbital welts and uvular swelling. Was concern of possible angioedema secondary to tenecteplase. He was intubated for airway protection and underwent cerebral angiogram by Dr. Tommie Sams. Recanalization  of the left MCA vascular tree noted. Critical care medicine consulted for management and placed on low-dose Neo-Synephrine infusion. Extubated on 4/23. 2D echo with EF of approximately 60 to 65% with trivial MVR. LDL 96, hemoglobin A1c 6.3%. He was started on heparin subcutaneously for VTE prophylaxis.     DIAGNOSTIC FINDINGS:    CT Head - 08/14/2022 Mild hyperdensity of the left MCA   CT Head and Neck Angio - 08/14/2022 Proximal occlusion of the left MCA M1  segment with poor collateralization throughout the MCA territory.   IR Angio - 08/15/2022 Interval recanalization of the left M1/MCA occlusion seen on prior CT angiogram likely as a result of TNK administration. Therefore, no intervention performed.   MRI 08/15/2022 Acute cortical infarcts along the left frontal operculum and left anterior insula with additional small area of acute cortical infarct within left parietal lobe. Other areas of faint cortical DWI hyperintensity throughout the left MCA territory, including the left putamen, may reflect additional areas of infarct or ischemia. 2. Loss of the left transverse sinus flow void is favored to reflect slow flow. If clinical suspicion for thrombosis, CT venography could be performed for further characterization.     PAIN:  Are you having pain? No   FALLS: Has patient fallen in last 6 months?  No   LIVING ENVIRONMENT: Lives with: lives with their family Lives in: House/apartment   PLOF:  Level of assistance: Independent with ADLs, Independent with IADLs Employment: Full-time employment     PATIENT GOALS    to be able to communicate again   SUBJECTIVE STATEMENT:  Pt forgot to bring in his flashcard Pt accompanied by: pt's sons, [name] and [name]   OBJECTIVE:   TODAY'S TREATMENT:  Skilled treatment session focused on pt's communication goals. 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 With moderate support from this writer, pt able to use multimodal means of communicate (written, text to speech app, pictures on his phone) to communicate information. Pt's verbal expression was perseverative during today's session with little awareness. Difficulty building awareness as pt didn't understand written  comparisons of what he was saying vs actual information. He corrected the inaccurate written information while continuing to verbal the inaccurate information.     PATIENT EDUCATION: Education details: see above Person educated: Patient and son Education method: Explanation Education comprehension: verbalized understanding and needs further education   HOME EXERCISE PROGRAM:  Talk Path Therapy tasks As well as singing to R&B Song Drills to promote accurate motor movements     GOALS:   Goals reviewed with patient? Yes   SHORT TERM GOALS: Target date: 10 sessions   UPDATED GOALS: 09/12/2022 UPDATED GOALS: 10/17/2022 The patient will name common household objects at 80% accuracy given frequent maximum verbal and frequent maximum phonemic cues. Goal status: INITIAL Goal Status: MET for names of family members Goal Updated: With moderate assistance, pt will name common household objects at 80% accuracy. ONGOING   2.  The patient will identify the correct word given 2 choices at 80% accuracy given frequent maximal visual cues in order to increase ability to comprehend simple instructions. Goal status: INITIAL Goal status: Ongoing; ONGOING   3.  The patient will follow simple body commands presented auditorily at 80% accuracy given frequent maximal visual cues. Goal status: INITIAL Goal status: Ongoing; ONGOING  4.  The patient will say functional words (e.g., water, toilet) at 50% accuracy given frequent maximal phonemic placement cues in order to communicate ability to communicate basic wants and needs. Goal status: INITIAL Goal status: MET Goal updated: The patient will say functional words (e.g., water, toilet) at 75% accuracy given frequent maximal phonemic placement cues in order to communicate ability to communicate basic wants and needs. MET     LONG TERM GOALS: Target date: 11/17/2022   Updated: 09/12/2022  Updated: 10/17/2022 Pt will use multimodal means of  communication to express basic biographical information about himself with moderate assistance.  Baseline:  Goal status: INITIAL Goal status: ONGOING; progress made    2.  Pt will demonstrate > 90% speech intelligibility when reading orientation information.  Baseline:  Goal status: INITIAL Goal status: ONGOING; progress made     ASSESSMENT:   CLINICAL IMPRESSION: Pt is a 57 year old right handed male who was seen today for a speech-language treatment d/t moderate transcortical sensory aphasia. Pt continues to make good progress. His son reports that pt "talks more now than he did before the stroke." See treatment note for more details.   OBJECTIVE IMPAIRMENTS include expressive language, receptive language, apraxia, and reading comprehension and ability to produce written language . These impairments are limiting patient from return to work, managing medications, managing appointments, managing finances, household responsibilities, ADLs/IADLs, and effectively communicating at home and in community. Factors affecting potential to achieve goals and functional outcome are severity of impairments. Patient will benefit from skilled SLP services to address above impairments and improve overall function.   REHAB POTENTIAL: Excellent   PLAN: SLP FREQUENCY: 3x/week to 4xweek   SLP DURATION: 12 weeks   PLANNED INTERVENTIONS: Language facilitation, Cueing hierachy, Functional tasks, Multimodal communication approach, SLP instruction and feedback, Compensatory strategies, and Patient/family education       Chase Knebel B. Dreama Saa, M.S., CCC-SLP, Tree surgeon Certified Brain Injury Specialist [facility]  [facility] Rehabilitation Services Office [phone] Ascom [phone] Fax [phone]

## 2022-10-20 NOTE — Therapy (Signed)
OUTPATIENT SPEECH LANGUAGE PATHOLOGY  TREATMENT NOTE     Patient Name: [name] MRN: [id number] DOB:16-Apr-1966, 57 y.o., male Today's Date: 10/19/2022   PCP: Myrene Buddy, NP REFERRING PROVIDER: Mariam Dollar, PA     End of Session - 10/19/22 1338     Visit Number 42    Number of Visits 49    Date for SLP Re-Evaluation 11/17/22    Authorization Type [employer]    Progress Note Due on Visit 50    SLP Start Time 1315    SLP Stop Time  1400    SLP Time Calculation (min) 45 min    Activity Tolerance Patient tolerated treatment well              Past Medical History:  Diagnosis Date   Hypertension    LVH (left ventricular hypertrophy)    Myocardial infarction (HCC)    Vitamin D deficiency    Past Surgical History:  Procedure Laterality Date   ANTERIOR CRUCIATE LIGAMENT REPAIR     COLONOSCOPY WITH PROPOFOL N/A 04/11/2018   Procedure: COLONOSCOPY WITH PROPOFOL;  Surgeon: Toledo, Boykin Nearing, MD;  Location: [facility] ENDOSCOPY;  Service: Gastroenterology;  Laterality: N/A;   GASTRIC RESECTION     IR ANGIO INTRA EXTRACRAN SEL INTERNAL CAROTID UNI L MOD SED  08/15/2022   IR US GUIDE VASC ACCESS RIGHT  08/15/2022   LAPAROTOMY     LOOP RECORDER INSERTION N/A 08/18/2022   Procedure: LOOP RECORDER INSERTION;  Surgeon: Regan Lemming, MD;  Location: [facility] INVASIVE CV LAB;  Service: Cardiovascular;  Laterality: N/A;   RADIOLOGY WITH ANESTHESIA N/A 08/14/2022   Procedure: IR WITH ANESTHESIA;  Surgeon: Julieanne Cotton, MD;  Location: [facility] OR;  Service: Radiology;  Laterality: N/A;   TEE WITHOUT CARDIOVERSION N/A 08/18/2022   Procedure: TRANSESOPHAGEAL ECHOCARDIOGRAM;  Surgeon: [name], MD;  Location: [facility] INVASIVE CV LAB;  Service: Cardiovascular;  Laterality: N/A;   Patient Active Problem List   Diagnosis Date Noted   Essential hypertension, benign 08/18/2022   Hyperlipidemia 08/18/2022   DM (diabetes mellitus), type 2 (HCC) 08/18/2022    Obesity (BMI 30-39.9) 08/18/2022   CAD (coronary artery disease) 08/18/2022   OSA (obstructive sleep apnea) 08/18/2022   Acute ischemic left MCA stroke (HCC) 08/18/2022   Acute ischemic right MCA stroke (HCC) 08/14/2022   Hypertensive urgency 04/27/2017   ONSET DATE: 08/14/2022; date of referral 08/23/2022    REFERRING DIAG: W09.811 (ICD-10-CM) - Cerebral infarction due to unspecified occlusion or stenosis of left middle cerebral artery   THERAPY DIAG:  Aphasia   Apraxia   Cerebral infarction due to unspecified occlusion or stenosis of left middle cerebral artery (HCC)   Rationale for Evaluation and Treatment Rehabilitation   SUBJECTIVE:      PERTINENT HISTORY:  [name] is 57 year old male who presented to the [facility] ED on 08/14/2022 noted by wife to be very weak and confused and halfway enter out of the bathtub at home. Symptoms were sudden in onset. Last known well 9 PM. Patient presented with global aphasia. Code stroke activated. CT head negative for hemorrhage, aspects of 9, mild hyperdensity left MCA could indicate thrombus. Tenecteplase administered. CTA showed left M1 occlusion and neurointerventional radiology contacted. Patient transferred to [facility] for thrombectomy. On route he was noted to have periorbital welts and uvular swelling. Was concern of possible angioedema secondary to tenecteplase. He was intubated for airway protection and underwent cerebral angiogram by Dr. Tommie Sams. Recanalization  of the left MCA vascular tree noted. Critical care medicine consulted for management and placed on low-dose Neo-Synephrine infusion. Extubated on 4/23. 2D echo with EF of approximately 60 to 65% with trivial MVR. LDL 96, hemoglobin A1c 6.3%. He was started on heparin subcutaneously for VTE prophylaxis.     DIAGNOSTIC FINDINGS:    CT Head - 08/14/2022 Mild hyperdensity of the left MCA   CT Head and Neck Angio - 08/14/2022 Proximal occlusion of the left MCA M1  segment with poor collateralization throughout the MCA territory.   IR Angio - 08/15/2022 Interval recanalization of the left M1/MCA occlusion seen on prior CT angiogram likely as a result of TNK administration. Therefore, no intervention performed.   MRI 08/15/2022 Acute cortical infarcts along the left frontal operculum and left anterior insula with additional small area of acute cortical infarct within left parietal lobe. Other areas of faint cortical DWI hyperintensity throughout the left MCA territory, including the left putamen, may reflect additional areas of infarct or ischemia. 2. Loss of the left transverse sinus flow void is favored to reflect slow flow. If clinical suspicion for thrombosis, CT venography could be performed for further characterization.     PAIN:  Are you having pain? No   FALLS: Has patient fallen in last 6 months?  No   LIVING ENVIRONMENT: Lives with: lives with their family Lives in: House/apartment   PLOF:  Level of assistance: Independent with ADLs, Independent with IADLs Employment: Full-time employment     PATIENT GOALS    to be able to communicate again   SUBJECTIVE STATEMENT:  Pt forgot to bring in his flashcard Pt accompanied by: pt's sons, [name] and [name]   OBJECTIVE:   TODAY'S TREATMENT:  Skilled treatment session focused on pt's communication goals. SLP facilitated session by providing the following interventions:   To target functional communication skills, SLP engaged pt in basic conversation about his workout routine and politics. Pt able to use written, Text to Speech App and verbal communication to communicate information and opinions. Pt benefited from moderate cues and context but demonstrated great progress.     PATIENT EDUCATION: Education details: see above Person educated: Patient and son Education method: Explanation Education comprehension: verbalized understanding and needs further education   HOME EXERCISE  PROGRAM:  Talk Path Therapy tasks As well as singing to R&B Song Drills to promote accurate motor movements     GOALS:   Goals reviewed with patient? Yes   SHORT TERM GOALS: Target date: 10 sessions   UPDATED GOALS: 09/12/2022 UPDATED GOALS: 10/17/2022 The patient will name common household objects at 80% accuracy given frequent maximum verbal and frequent maximum phonemic cues. Goal status: INITIAL Goal Status: MET for names of family members Goal Updated: With moderate assistance, pt will name common household objects at 80% accuracy. ONGOING   2.  The patient will identify the correct word given 2 choices at 80% accuracy given frequent maximal visual cues in order to increase ability to comprehend simple instructions. Goal status: INITIAL Goal status: Ongoing; ONGOING   3.  The patient will follow simple body commands presented auditorily at 80% accuracy given frequent maximal visual cues. Goal status: INITIAL Goal status: Ongoing; ONGOING   4.  The patient will say functional words (e.g., water, toilet) at 50% accuracy given frequent maximal phonemic placement cues in order to communicate ability to communicate basic wants and needs. Goal status: INITIAL Goal status: MET Goal updated: The patient will say functional words (e.g., water, toilet) at  75% accuracy given frequent maximal phonemic placement cues in order to communicate ability to communicate basic wants and needs. MET     LONG TERM GOALS: Target date: 11/17/2022   Updated: 09/12/2022  Updated: 10/17/2022 Pt will use multimodal means of communication to express basic biographical information about himself with moderate assistance.  Baseline:  Goal status: INITIAL Goal status: ONGOING; progress made    2.  Pt will demonstrate > 90% speech intelligibility when reading orientation information.  Baseline:  Goal status: INITIAL Goal status: ONGOING; progress made     ASSESSMENT:   CLINICAL IMPRESSION: Pt is a  57 year old right handed male who was seen today for a speech-language treatment d/t moderate transcortical sensory aphasia. Pt continues to make good progress. His son reports that pt "talks more now than he did before the stroke." See treatment note for more details.   OBJECTIVE IMPAIRMENTS include expressive language, receptive language, apraxia, and reading comprehension and ability to produce written language . These impairments are limiting patient from return to work, managing medications, managing appointments, managing finances, household responsibilities, ADLs/IADLs, and effectively communicating at home and in community. Factors affecting potential to achieve goals and functional outcome are severity of impairments. Patient will benefit from skilled SLP services to address above impairments and improve overall function.   REHAB POTENTIAL: Excellent   PLAN: SLP FREQUENCY: 3x/week to 4xweek   SLP DURATION: 12 weeks   PLANNED INTERVENTIONS: Language facilitation, Cueing hierachy, Functional tasks, Multimodal communication approach, SLP instruction and feedback, Compensatory strategies, and Patient/family education       Alesha Jaffee B. Dreama Saa, M.S., CCC-SLP, Tree surgeon Certified Brain Injury Specialist [facility]  [facility] Office [phone] Ascom [phone] Fax [phone]

## 2022-10-21 ENCOUNTER — Ambulatory Visit: Payer: BC Managed Care – PPO | Admitting: Speech Pathology

## 2022-10-24 ENCOUNTER — Ambulatory Visit: Payer: BC Managed Care – PPO | Attending: Physician Assistant | Admitting: Speech Pathology

## 2022-10-24 ENCOUNTER — Institutional Professional Consult (permissible substitution): Payer: BC Managed Care – PPO | Admitting: Cardiovascular Disease

## 2022-10-24 DIAGNOSIS — R4701 Aphasia: Secondary | ICD-10-CM | POA: Diagnosis present

## 2022-10-24 DIAGNOSIS — R482 Apraxia: Secondary | ICD-10-CM | POA: Diagnosis present

## 2022-10-24 DIAGNOSIS — I63512 Cerebral infarction due to unspecified occlusion or stenosis of left middle cerebral artery: Secondary | ICD-10-CM | POA: Diagnosis present

## 2022-10-24 NOTE — Therapy (Unsigned)
OUTPATIENT SPEECH LANGUAGE PATHOLOGY  TREATMENT NOTE     Patient Name: [name] MRN: [id number] DOB:1966-01-02, 57 y.o., male Today's Date: 10/24/2022   PCP: Jon Buddy, NP REFERRING PROVIDER: Mariam Dollar, PA     End of Session - 10/24/22 1549     Visit Number 44    Number of Visits 49    Date for SLP Re-Evaluation 11/17/22    Authorization Type [employer]    Progress Note Due on Visit 50    SLP Start Time 1400    SLP Stop Time  1445    SLP Time Calculation (min) 45 min    Activity Tolerance Patient tolerated treatment well              Past Medical History:  Diagnosis Date   Hypertension    LVH (left ventricular hypertrophy)    Myocardial infarction (HCC)    Vitamin D deficiency    Past Surgical History:  Procedure Laterality Date   ANTERIOR CRUCIATE LIGAMENT REPAIR     COLONOSCOPY WITH PROPOFOL N/A 04/11/2018   Procedure: COLONOSCOPY WITH PROPOFOL;  Surgeon: Toledo, Boykin Nearing, MD;  Location: [facility] ENDOSCOPY;  Service: Gastroenterology;  Laterality: N/A;   GASTRIC RESECTION     IR ANGIO INTRA EXTRACRAN SEL INTERNAL CAROTID UNI L MOD SED  08/15/2022   IR US GUIDE VASC ACCESS RIGHT  08/15/2022   LAPAROTOMY     LOOP RECORDER INSERTION N/A 08/18/2022   Procedure: LOOP RECORDER INSERTION;  Surgeon: Regan Lemming, MD;  Location: [facility] INVASIVE CV LAB;  Service: Cardiovascular;  Laterality: N/A;   RADIOLOGY WITH ANESTHESIA N/A 08/14/2022   Procedure: IR WITH ANESTHESIA;  Surgeon: Julieanne Cotton, MD;  Location: [facility] OR;  Service: Radiology;  Laterality: N/A;   TEE WITHOUT CARDIOVERSION N/A 08/18/2022   Procedure: TRANSESOPHAGEAL ECHOCARDIOGRAM;  Surgeon: [name], MD;  Location: [facility] INVASIVE CV LAB;  Service: Cardiovascular;  Laterality: N/A;   Patient Active Problem List   Diagnosis Date Noted   Essential hypertension, benign 08/18/2022   Hyperlipidemia 08/18/2022   DM (diabetes mellitus), type 2 (HCC) 08/18/2022   Obesity  (BMI 30-39.9) 08/18/2022   CAD (coronary artery disease) 08/18/2022   OSA (obstructive sleep apnea) 08/18/2022   Acute ischemic left MCA stroke (HCC) 08/18/2022   Acute ischemic right MCA stroke (HCC) 08/14/2022   Hypertensive urgency 04/27/2017   ONSET DATE: 08/14/2022; date of referral 08/23/2022    REFERRING DIAG: W29.562 (ICD-10-CM) - Cerebral infarction due to unspecified occlusion or stenosis of left middle cerebral artery   THERAPY DIAG:  Aphasia   Apraxia   Cerebral infarction due to unspecified occlusion or stenosis of left middle cerebral artery (HCC)   Rationale for Evaluation and Treatment Rehabilitation   SUBJECTIVE:      PERTINENT HISTORY:  [name] is 57 year old male who presented to the [facility] ED on 08/14/2022 noted by wife to be very weak and confused and halfway enter out of the bathtub at home. Symptoms were sudden in onset. Last known well 9 PM. Patient presented with global aphasia. Code stroke activated. CT head negative for hemorrhage, aspects of 9, mild hyperdensity left MCA could indicate thrombus. Tenecteplase administered. CTA showed left M1 occlusion and neurointerventional radiology contacted. Patient transferred to [facility] for thrombectomy. On route he was noted to have periorbital welts and uvular swelling. Was concern of possible angioedema secondary to tenecteplase. He was intubated for airway protection and underwent cerebral angiogram by Dr. Tommie Sams. Recanalization  of the left MCA vascular tree noted. Critical care medicine consulted for management and placed on low-dose Neo-Synephrine infusion. Extubated on 4/23. 2D echo with EF of approximately 60 to 65% with trivial MVR. LDL 96, hemoglobin A1c 6.3%. He was started on heparin subcutaneously for VTE prophylaxis.     DIAGNOSTIC FINDINGS:    CT Head - 08/14/2022 Mild hyperdensity of the left MCA   CT Head and Neck Angio - 08/14/2022 Proximal occlusion of the left MCA M1 segment  with poor collateralization throughout the MCA territory.   IR Angio - 08/15/2022 Interval recanalization of the left M1/MCA occlusion seen on prior CT angiogram likely as a result of TNK administration. Therefore, no intervention performed.   MRI 08/15/2022 Acute cortical infarcts along the left frontal operculum and left anterior insula with additional small area of acute cortical infarct within left parietal lobe. Other areas of faint cortical DWI hyperintensity throughout the left MCA territory, including the left putamen, may reflect additional areas of infarct or ischemia. 2. Loss of the left transverse sinus flow void is favored to reflect slow flow. If clinical suspicion for thrombosis, CT venography could be performed for further characterization.     PAIN:  Are you having pain? No   FALLS: Has patient fallen in last 6 months?  No   LIVING ENVIRONMENT: Lives with: lives with their family Lives in: House/apartment   PLOF:  Level of assistance: Independent with ADLs, Independent with IADLs Employment: Full-time employment     PATIENT GOALS    to be able to communicate again   SUBJECTIVE STATEMENT:  Pt forgot to bring in his flashcard Pt accompanied by: pt's sons, [name] and [name]   OBJECTIVE:   TODAY'S TREATMENT:  Skilled treatment session focused on pt's communication goals. SLP facilitated session by providing the following interventions:  Pt's wife attended session and reports improved response to cues for correcting verbal perseveration and she also reports that pt is using more words "stringed together."  To promote functional communication, SLP engaged pt in conversation surrounding recent presidential debate.  Pt typed complete sentences x 2, required minimal cues for perseverative verbalization as well a spoke phrase level intelligible responses. Much progress noted!  PATIENT EDUCATION: Education details: see above Person educated: Patient and  wife Education method: Explanation Education comprehension: verbalized understanding and needs further education   HOME EXERCISE PROGRAM:  Talk Path Therapy tasks As well as singing to R&B Song Drills to promote accurate motor movements     GOALS:   Goals reviewed with patient? Yes   SHORT TERM GOALS: Target date: 10 sessions   UPDATED GOALS: 09/12/2022 UPDATED GOALS: 10/17/2022 The patient will name common household objects at 80% accuracy given frequent maximum verbal and frequent maximum phonemic cues. Goal status: INITIAL Goal Status: MET for names of family members Goal Updated: With moderate assistance, pt will name common household objects at 80% accuracy. ONGOING   2.  The patient will identify the correct word given 2 choices at 80% accuracy given frequent maximal visual cues in order to increase ability to comprehend simple instructions. Goal status: INITIAL Goal status: Ongoing; ONGOING   3.  The patient will follow simple body commands presented auditorily at 80% accuracy given frequent maximal visual cues. Goal status: INITIAL Goal status: Ongoing; ONGOING   4.  The patient will say functional words (e.g., water, toilet) at 50% accuracy given frequent maximal phonemic placement cues in order to communicate ability to communicate basic wants and needs. Goal status: INITIAL  Goal status: MET Goal updated: The patient will say functional words (e.g., water, toilet) at 75% accuracy given frequent maximal phonemic placement cues in order to communicate ability to communicate basic wants and needs. MET     LONG TERM GOALS: Target date: 11/17/2022   Updated: 09/12/2022  Updated: 10/17/2022 Pt will use multimodal means of communication to express basic biographical information about himself with moderate assistance.  Baseline:  Goal status: INITIAL Goal status: ONGOING; progress made    2.  Pt will demonstrate > 90% speech intelligibility when reading orientation  information.  Baseline:  Goal status: INITIAL Goal status: ONGOING; progress made     ASSESSMENT:   CLINICAL IMPRESSION: Pt is a 57 year old right handed male who was seen today for a speech-language treatment d/t moderate transcortical sensory aphasia. Pt continues to make good progress. His son reports that pt "talks more now than he did before the stroke." See treatment note for more details.   OBJECTIVE IMPAIRMENTS include expressive language, receptive language, apraxia, and reading comprehension and ability to produce written language . These impairments are limiting patient from return to work, managing medications, managing appointments, managing finances, household responsibilities, ADLs/IADLs, and effectively communicating at home and in community. Factors affecting potential to achieve goals and functional outcome are severity of impairments. Patient will benefit from skilled SLP services to address above impairments and improve overall function.   REHAB POTENTIAL: Excellent   PLAN: SLP FREQUENCY: 3x/week to 4xweek   SLP DURATION: 12 weeks   PLANNED INTERVENTIONS: Language facilitation, Cueing hierachy, Functional tasks, Multimodal communication approach, SLP instruction and feedback, Compensatory strategies, and Patient/family education       Roxann Vierra B. Dreama Saa, M.S., CCC-SLP, Tree surgeon Certified Brain Injury Specialist [facility]  [facility] Office [phone] Ascom [phone] Fax [phone]

## 2022-10-25 ENCOUNTER — Telehealth: Payer: Self-pay | Admitting: Speech Pathology

## 2022-10-25 ENCOUNTER — Encounter: Payer: BC Managed Care – PPO | Admitting: Speech Pathology

## 2022-10-25 ENCOUNTER — Ambulatory Visit: Payer: BC Managed Care – PPO | Admitting: Speech Pathology

## 2022-10-25 DIAGNOSIS — I63512 Cerebral infarction due to unspecified occlusion or stenosis of left middle cerebral artery: Secondary | ICD-10-CM

## 2022-10-25 DIAGNOSIS — R4701 Aphasia: Secondary | ICD-10-CM

## 2022-10-25 DIAGNOSIS — R482 Apraxia: Secondary | ICD-10-CM

## 2022-10-25 NOTE — Therapy (Signed)
OUTPATIENT SPEECH LANGUAGE PATHOLOGY  TREATMENT NOTE     Patient Name: [name] MRN: [id number] DOB:April 20, 1966, 57 y.o., male Today's Date: 10/25/2022   PCP: Myrene Buddy, NP REFERRING PROVIDER: Mariam Dollar, PA     End of Session - 10/25/22 1602     Visit Number 45    Number of Visits 49    Date for SLP Re-Evaluation 11/17/22    Authorization Type [employer]    Progress Note Due on Visit 50    SLP Start Time 1400    SLP Stop Time  1445    SLP Time Calculation (min) 45 min    Activity Tolerance Patient tolerated treatment well              Past Medical History:  Diagnosis Date   Hypertension    LVH (left ventricular hypertrophy)    Myocardial infarction (HCC)    Vitamin D deficiency    Past Surgical History:  Procedure Laterality Date   ANTERIOR CRUCIATE LIGAMENT REPAIR     COLONOSCOPY WITH PROPOFOL N/A 04/11/2018   Procedure: COLONOSCOPY WITH PROPOFOL;  Surgeon: Toledo, Boykin Nearing, MD;  Location: [facility] ENDOSCOPY;  Service: Gastroenterology;  Laterality: N/A;   GASTRIC RESECTION     IR ANGIO INTRA EXTRACRAN SEL INTERNAL CAROTID UNI L MOD SED  08/15/2022   IR US GUIDE VASC ACCESS RIGHT  08/15/2022   LAPAROTOMY     LOOP RECORDER INSERTION N/A 08/18/2022   Procedure: LOOP RECORDER INSERTION;  Surgeon: Regan Lemming, MD;  Location: [facility] INVASIVE CV LAB;  Service: Cardiovascular;  Laterality: N/A;   RADIOLOGY WITH ANESTHESIA N/A 08/14/2022   Procedure: IR WITH ANESTHESIA;  Surgeon: Julieanne Cotton, MD;  Location: [facility] OR;  Service: Radiology;  Laterality: N/A;   TEE WITHOUT CARDIOVERSION N/A 08/18/2022   Procedure: TRANSESOPHAGEAL ECHOCARDIOGRAM;  Surgeon: [name], MD;  Location: [facility] INVASIVE CV LAB;  Service: Cardiovascular;  Laterality: N/A;   Patient Active Problem List   Diagnosis Date Noted   Essential hypertension, benign 08/18/2022   Hyperlipidemia 08/18/2022   DM (diabetes mellitus), type 2 (HCC) 08/18/2022   Obesity  (BMI 30-39.9) 08/18/2022   CAD (coronary artery disease) 08/18/2022   OSA (obstructive sleep apnea) 08/18/2022   Acute ischemic left MCA stroke (HCC) 08/18/2022   Acute ischemic right MCA stroke (HCC) 08/14/2022   Hypertensive urgency 04/27/2017   ONSET DATE: 08/14/2022; date of referral 08/23/2022    REFERRING DIAG: N56.213 (ICD-10-CM) - Cerebral infarction due to unspecified occlusion or stenosis of left middle cerebral artery   THERAPY DIAG:  Aphasia   Apraxia   Cerebral infarction due to unspecified occlusion or stenosis of left middle cerebral artery (HCC)   Rationale for Evaluation and Treatment Rehabilitation   SUBJECTIVE:      PERTINENT HISTORY:  [name] is 57 year old male who presented to the [facility] ED on 08/14/2022 noted by wife to be very weak and confused and halfway enter out of the bathtub at home. Symptoms were sudden in onset. Last known well 9 PM. Patient presented with global aphasia. Code stroke activated. CT head negative for hemorrhage, aspects of 9, mild hyperdensity left MCA could indicate thrombus. Tenecteplase administered. CTA showed left M1 occlusion and neurointerventional radiology contacted. Patient transferred to [facility] for thrombectomy. On route he was noted to have periorbital welts and uvular swelling. Was concern of possible angioedema secondary to tenecteplase. He was intubated for airway protection and underwent cerebral angiogram by Dr. Tommie Sams. Recanalization  of the left MCA vascular tree noted. Critical care medicine consulted for management and placed on low-dose Neo-Synephrine infusion. Extubated on 4/23. 2D echo with EF of approximately 60 to 65% with trivial MVR. LDL 96, hemoglobin A1c 6.3%. He was started on heparin subcutaneously for VTE prophylaxis.     DIAGNOSTIC FINDINGS:    CT Head - 08/14/2022 Mild hyperdensity of the left MCA   CT Head and Neck Angio - 08/14/2022 Proximal occlusion of the left MCA M1 segment  with poor collateralization throughout the MCA territory.   IR Angio - 08/15/2022 Interval recanalization of the left M1/MCA occlusion seen on prior CT angiogram likely as a result of TNK administration. Therefore, no intervention performed.   MRI 08/15/2022 Acute cortical infarcts along the left frontal operculum and left anterior insula with additional small area of acute cortical infarct within left parietal lobe. Other areas of faint cortical DWI hyperintensity throughout the left MCA territory, including the left putamen, may reflect additional areas of infarct or ischemia. 2. Loss of the left transverse sinus flow void is favored to reflect slow flow. If clinical suspicion for thrombosis, CT venography could be performed for further characterization.     PAIN:  Are you having pain? No   FALLS: Has patient fallen in last 6 months?  No   LIVING ENVIRONMENT: Lives with: lives with their family Lives in: House/apartment   PLOF:  Level of assistance: Independent with ADLs, Independent with IADLs Employment: Full-time employment     PATIENT GOALS    to be able to communicate again   SUBJECTIVE STATEMENT:  Pt forgot to bring in his flashcard Pt accompanied by: pt's sons, [name] and [name]   OBJECTIVE:   TODAY'S TREATMENT:  Skilled treatment session focused on pt's communication goals. SLP facilitated session by providing the following interventions:  Word finding targeted thru categories based on specific letter (name 2 fruits that start with F) pt much improved use of correct verbal words, text to speech and physical writing to achieve 100%. Pt less perseverative today, much improved ability to cease being perseverative.   PATIENT EDUCATION: Education details: see above Person educated: Patient and wife Education method: Explanation Education comprehension: verbalized understanding and needs further education   HOME EXERCISE PROGRAM:  Talk Path Therapy tasks As well  as singing to R&B Song Drills to promote accurate motor movements     GOALS:   Goals reviewed with patient? Yes   SHORT TERM GOALS: Target date: 10 sessions   UPDATED GOALS: 09/12/2022 UPDATED GOALS: 10/17/2022 The patient will name common household objects at 80% accuracy given frequent maximum verbal and frequent maximum phonemic cues. Goal status: INITIAL Goal Status: MET for names of family members Goal Updated: With moderate assistance, pt will name common household objects at 80% accuracy. ONGOING   2.  The patient will identify the correct word given 2 choices at 80% accuracy given frequent maximal visual cues in order to increase ability to comprehend simple instructions. Goal status: INITIAL Goal status: Ongoing; ONGOING   3.  The patient will follow simple body commands presented auditorily at 80% accuracy given frequent maximal visual cues. Goal status: INITIAL Goal status: Ongoing; ONGOING   4.  The patient will say functional words (e.g., water, toilet) at 50% accuracy given frequent maximal phonemic placement cues in order to communicate ability to communicate basic wants and needs. Goal status: INITIAL Goal status: MET Goal updated: The patient will say functional words (e.g., water, toilet) at 75% accuracy given frequent  maximal phonemic placement cues in order to communicate ability to communicate basic wants and needs. MET     LONG TERM GOALS: Target date: 11/17/2022   Updated: 09/12/2022  Updated: 10/17/2022 Pt will use multimodal means of communication to express basic biographical information about himself with moderate assistance.  Baseline:  Goal status: INITIAL Goal status: ONGOING; progress made    2.  Pt will demonstrate > 90% speech intelligibility when reading orientation information.  Baseline:  Goal status: INITIAL Goal status: ONGOING; progress made     ASSESSMENT:   CLINICAL IMPRESSION: Pt is a 57 year old right handed male who was seen  today for a speech-language treatment d/t moderate transcortical sensory aphasia. Pt continues to make good progress. His son reports that pt "talks more now than he did before the stroke." See treatment note for more details.   OBJECTIVE IMPAIRMENTS include expressive language, receptive language, apraxia, and reading comprehension and ability to produce written language . These impairments are limiting patient from return to work, managing medications, managing appointments, managing finances, household responsibilities, ADLs/IADLs, and effectively communicating at home and in community. Factors affecting potential to achieve goals and functional outcome are severity of impairments. Patient will benefit from skilled SLP services to address above impairments and improve overall function.   REHAB POTENTIAL: Excellent   PLAN: SLP FREQUENCY: 3x/week to 4xweek   SLP DURATION: 12 weeks   PLANNED INTERVENTIONS: Language facilitation, Cueing hierachy, Functional tasks, Multimodal communication approach, SLP instruction and feedback, Compensatory strategies, and Patient/family education       Roczen Waymire B. Dreama Saa, M.S., CCC-SLP, Tree surgeon Certified Brain Injury Specialist [facility]  [facility] Rehabilitation Services Office [phone] Ascom [phone] Fax [phone]

## 2022-10-26 ENCOUNTER — Encounter: Payer: BC Managed Care – PPO | Admitting: Speech Pathology

## 2022-10-28 ENCOUNTER — Encounter: Payer: BC Managed Care – PPO | Admitting: Speech Pathology

## 2022-10-31 ENCOUNTER — Encounter: Payer: Self-pay | Admitting: Neurology

## 2022-10-31 ENCOUNTER — Ambulatory Visit: Payer: BC Managed Care – PPO | Admitting: Speech Pathology

## 2022-10-31 ENCOUNTER — Ambulatory Visit: Payer: BC Managed Care – PPO | Admitting: Neurology

## 2022-10-31 ENCOUNTER — Ambulatory Visit (INDEPENDENT_AMBULATORY_CARE_PROVIDER_SITE_OTHER): Payer: BC Managed Care – PPO

## 2022-10-31 VITALS — BP 159/106 | HR 98 | Ht 72.0 in | Wt 200.0 lb

## 2022-10-31 DIAGNOSIS — I6602 Occlusion and stenosis of left middle cerebral artery: Secondary | ICD-10-CM | POA: Diagnosis not present

## 2022-10-31 DIAGNOSIS — G4733 Obstructive sleep apnea (adult) (pediatric): Secondary | ICD-10-CM | POA: Diagnosis not present

## 2022-10-31 DIAGNOSIS — R4701 Aphasia: Secondary | ICD-10-CM

## 2022-10-31 DIAGNOSIS — I63511 Cerebral infarction due to unspecified occlusion or stenosis of right middle cerebral artery: Secondary | ICD-10-CM

## 2022-10-31 DIAGNOSIS — I639 Cerebral infarction, unspecified: Secondary | ICD-10-CM | POA: Diagnosis not present

## 2022-10-31 DIAGNOSIS — I63512 Cerebral infarction due to unspecified occlusion or stenosis of left middle cerebral artery: Secondary | ICD-10-CM

## 2022-10-31 LAB — CUP PACEART REMOTE DEVICE CHECK
Date Time Interrogation Session: 20240705230040
Implantable Pulse Generator Implant Date: 20240425

## 2022-10-31 NOTE — Progress Notes (Signed)
[facility] [address] [place]. [place] [address] [phone]       OFFICE FOLLOW-UP NOTE  [name] Date of Birth:  [birth year] Medical Record Number:  [id number]   HPI: [name] is a pleasant 56 year old African-American male seen today for initial office follow-up visit following hospital admission for stroke in April 2024.  He is accompanied by his wife.  History is obtained from them and review of electronic medical records.  I personally reviewed pertinent available imaging films in PACS.  He has past medical history of hypertension, hyperlipidemia, diabetes, coronary artery disease, obstructive sleep apnea and .  Obesity.  He presented on 08/14/2022 via EMS due to sudden onset of confusion, inability to talk and right-sided weakness.  He met criteria for and was given IV TNK at outside hospital and transferred to [facility] for emergent mechanical thrombectomy CT angiogram showing Left M1 occlusion however no thrombectomy was required due to recanalization of the M1 following TNK..  Patient was kept intubated postprocedure due to angioedema possibly due to reaction from TNK.  He was also in the ICU and monitored closely.  CT head on admission showed decreased gray-white differentiation of the left insular cortex aspect score of 9.  MRI scan showed acute cortical infarct along the left frontal operculum and left anterior insula with additional small area of acute cortical infarct in the left parietal lobe.  2D echo showed ejection fraction of 60 to 65% with trivial mitral regurg.  TEE showed small PFO.  TCD bubble study however positive for curtain sign indicating of a large right to left shunt.  Lower extremity venous Dopplers were negative for DVT.  LDL cholesterol 96 mg percent.  Hemoglobin A1c was 6.3.  Patient had loop recorder placed so far paroxysmal A-fib has not yet been found.  Patient was discharged on aspirin and Plavix for 3 weeks followed by Plavix  alone.  He is tolerating it well without bruising or bleeding.  He has finished physical occupational therapy and is still participating in outpatient speech therapy.  He feels his language is improving though he still has significant word hesitancy and make paraphasic errors.  He also has some trouble with memory and repetition.  Comprehension has improved.  He is still on medical leave and plans to apply for short-term disability.  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  His blood pressure is quite good at home usually in the 130-140 systolic range but today it is slightly elevated.  He is tolerating Lipitor well without muscle aches and pains.  He has no new complaints. ROS:   14 system review of systems is positive for aphasia, speech difficulty, naming difficulty, word substitution all other systems negative  PMH:  Past Medical History:  Diagnosis Date   Hypertension    LVH (left ventricular hypertrophy)    Myocardial infarction (HCC)    Stroke (cerebrum) (HCC)    Vitamin D deficiency     Social History:  Social History   Socioeconomic History   Marital status: Married    Spouse name: Not on file   Number of children: Not on file   Years of education: Not on file   Highest education level: Not on file  Occupational History   Not on file  Tobacco Use   Smoking status: Unknown   Smokeless tobacco: Never  Vaping Use   Vaping Use: Never used  Substance and Sexual Activity   Alcohol use: Not Currently  Drug use: Not Currently   Sexual activity: Not on file  Other Topics Concern   Not on file  Social History Narrative   ** Merged History Encounter **       Social Determinants of Health   Financial Resource Strain: Not on file  Food Insecurity: No Food Insecurity (08/17/2022)   Hunger Vital Sign    Worried About Running Out of Food in the Last Year: Never true    Ran Out of Food in the Last Year: Never true  Transportation Needs: No Transportation Needs  (08/17/2022)   PRAPARE - Administrator, Civil Service (Medical): No    Lack of Transportation (Non-Medical): No  Physical Activity: Not on file  Stress: Not on file  Social Connections: Not on file  Intimate Partner Violence: Patient Unable To Answer (08/17/2022)   Humiliation, Afraid, Rape, and Kick questionnaire    Fear of Current or Ex-Partner: Patient unable to answer    Emotionally Abused: Patient unable to answer    Physically Abused: Patient unable to answer    Sexually Abused: Patient unable to answer    Medications:   Current Outpatient Medications on File Prior to Visit  Medication Sig Dispense Refill   amLODipine (NORVASC) 10 MG tablet Take 10 mg by mouth daily.     atorvastatin (LIPITOR) 20 MG tablet Take 1 tablet (20 mg total) by mouth daily. 30 tablet 0   carvedilol (COREG) 12.5 MG tablet Take 1 tablet (12.5 mg total) by mouth 2 (two) times daily. 60 tablet 0   chlorthalidone (HYGROTON) 25 MG tablet Take 12.5 mg by mouth every morning.     cholecalciferol (VITAMIN D3) 25 MCG (1000 UNIT) tablet Take 1,000 Units by mouth daily.     clopidogrel (PLAVIX) 75 MG tablet Take 1 tablet (75 mg total) by mouth daily. 30 tablet 0   No current facility-administered medications on file prior to visit.    Allergies:   Allergies  Allergen Reactions   Amitriptyline Anaphylaxis    Wife reported he had a cardiac arrest in the setting of starting amitriptyline in 2019   Tenecteplase Anaphylaxis    Hives and oral edema necessitating intubation following administration of Tenecteplase   Elavil [Amitriptyline Hcl]    Lisinopril Other (See Comments)    Angioedema suspected from TNK d/t timing of reaction; however, adding lisinopril so providers could be informed/cautious with future care (was on lisinopril pta).    Physical Exam General: well developed, well nourished pleasant middle-aged African-American male, seated, in no evident distress Head: head normocephalic and  atraumatic.  Neck: supple with no carotid or supraclavicular bruits Cardiovascular: regular rate and rhythm, no murmurs Musculoskeletal: no deformity Skin:  no rash/petichiae Vascular:  Normal pulses all extremities Vitals:   10/31/22 0746  BP: (!) 159/106  Pulse: 98   Neurologic Exam Mental Status: Awake and fully alert.  Moderate expressive aphasia and can speak only single words and occasional short sentences.  Difficulty with naming and repetition.  Good comprehension.  Follows commands well. Cranial Nerves: Fundoscopic exam reveals sharp disc margins. Pupils equal, briskly reactive to light. Extraocular movements full without nystagmus. Visual fields full to confrontation. Hearing intact. Facial sensation intact. Face, tongue, palate moves normally and symmetrically.  Motor: Normal bulk and tone. Normal strength in all tested extremity muscles.  Diminished fine finger movements on the right.  Orbits left or right upper extremity. Sensory.: intact to touch ,pinprick .position and vibratory sensation.  Coordination: Rapid alternating movements normal in  all extremities. Finger-to-nose and heel-to-shin performed accurately bilaterally. Gait and Station: Arises from chair without difficulty. Stance is normal. Gait demonstrates normal stride length and balance . Able to heel, toe and tandem walk with only slight difficulty.  Reflexes: 1+ and symmetric. Toes downgoing.   NIHSS  3 Modified Rankin  3   ASSESSMENT: 57 year old African-American male with left MCA branch in April 2024 due to left M1 occlusion.  With IV TNK with complete revascularization.  Patient has significant residual expressive aphasia.  Vascular risk factors of diabetes, hypertension, hyperlipidemia, obesity, coronary artery disease, obstructive sleep apnea and PFO     PLAN:I had a long d/w patient and his wife about his recent cryptogenic stroke, PFO, expressive aphasia, risk for recurrent stroke/TIAs, personally  independently reviewed imaging studies and stroke evaluation results and answered questions.Continue Plavix   for secondary stroke prevention and maintain strict control of hypertension with blood pressure goal below 130/90, diabetes with hemoglobin A1c goal below 6.5% and lipids with LDL cholesterol goal below 70 mg/dL. I also advised the patient to eat a healthy diet with plenty of whole grains, cereals, fruits and vegetables, exercise regularly and maintain ideal body weight continue ongoing speech therapy.  Recall for polysomnogram for sleep apnea.  Patient encouraged to keep appointment with Dr. Excell Seltzer to discuss endovascular PFO closure.  He has a ROPE score of 4 but if he has any high risk features on TEE he may benefit with endovascular PFO closure..Followup in the future with my nurse practitioner in 4 months or call earlier if necessary. Greater than 50% of time during this 35 minute visit was spent on counseling,explanation of diagnosis stroke, PFO and aphasia, planning of further management, discussion with patient and family and coordination of care Delia Heady, MD Note: This document was prepared with digital dictation and possible smart phrase technology. Any transcriptional errors that result from this process are unintentional

## 2022-10-31 NOTE — Patient Instructions (Signed)
I had a long d/w patient and his wife about his recent cryptogenic stroke, PFO, expressive aphasia, risk for recurrent stroke/TIAs, personally independently reviewed imaging studies and stroke evaluation results and answered questions.Continue Plavix   for secondary stroke prevention and maintain strict control of hypertension with blood pressure goal below 130/90, diabetes with hemoglobin A1c goal below 6.5% and lipids with LDL cholesterol goal below 70 mg/dL. I also advised the patient to eat a healthy diet with plenty of whole grains, cereals, fruits and vegetables, exercise regularly and maintain ideal body weight continue ongoing speech therapy.  Recall for polysomnogram for sleep apnea.  Patient encouraged to keep appointment with Dr. Excell Seltzer to discuss endovascular PFO closure..Followup in the future with my nurse practitioner in 4 months or call earlier if necessary.  Stroke Prevention Some medical conditions and behaviors can lead to a higher chance of having a stroke. You can help prevent a stroke by eating healthy, exercising, not smoking, and managing any medical conditions you have. Stroke is a leading cause of functional impairment. Primary prevention is particularly important because a majority of strokes are first-time events. Stroke changes the lives of not only those who experience a stroke but also their family and other caregivers. How can this condition affect me? A stroke is a medical emergency and should be treated right away. A stroke can lead to brain damage and can sometimes be life-threatening. If a person gets medical treatment right away, there is a better chance of surviving and recovering from a stroke. What can increase my risk? The following medical conditions may increase your risk of a stroke: Cardiovascular disease. High blood pressure (hypertension). Diabetes. High cholesterol. Sickle cell disease. Blood clotting disorders (hypercoagulable state). Obesity. Sleep  disorders (obstructive sleep apnea). Other risk factors include: Being older than age 17. Having a history of blood clots, stroke, or mini-stroke (transient ischemic attack, TIA). Genetic factors, such as race, ethnicity, or a family history of stroke. Smoking cigarettes or using other tobacco products. Taking birth control pills, especially if you also use tobacco. Heavy use of alcohol or drugs, especially cocaine and methamphetamine. Physical inactivity. What actions can I take to prevent this? Manage your health conditions High cholesterol levels. Eating a healthy diet is important for preventing high cholesterol. If cholesterol cannot be managed through diet alone, you may need to take medicines. Take any prescribed medicines to control your cholesterol as told by your health care provider. Hypertension. To reduce your risk of stroke, try to keep your blood pressure below 130/80. Eating a healthy diet and exercising regularly are important for controlling blood pressure. If these steps are not enough to manage your blood pressure, you may need to take medicines. Take any prescribed medicines to control hypertension as told by your health care provider. Ask your health care provider if you should monitor your blood pressure at home. Have your blood pressure checked every year, even if your blood pressure is normal. Blood pressure increases with age and some medical conditions. Diabetes. Eating a healthy diet and exercising regularly are important parts of managing your blood sugar (glucose). If your blood sugar cannot be managed through diet and exercise, you may need to take medicines. Take any prescribed medicines to control your diabetes as told by your health care provider. Get evaluated for obstructive sleep apnea. Talk to your health care provider about getting a sleep evaluation if you snore a lot or have excessive sleepiness. Make sure that any other medical conditions you have,  such  as atrial fibrillation or atherosclerosis, are managed. Nutrition Follow instructions from your health care provider about what to eat or drink to help manage your health condition. These instructions may include: Reducing your daily calorie intake. Limiting how much salt (sodium) you use to 1,500 milligrams (mg) each day. Using only healthy fats for cooking, such as olive oil, canola oil, or sunflower oil. Eating healthy foods. You can do this by: Choosing foods that are high in fiber, such as whole grains, and fresh fruits and vegetables. Eating at least 5 servings of fruits and vegetables a day. Try to fill one-half of your plate with fruits and vegetables at each meal. Choosing lean protein foods, such as lean cuts of meat, poultry without skin, fish, tofu, beans, and nuts. Eating low-fat dairy products. Avoiding foods that are high in sodium. This can help lower blood pressure. Avoiding foods that have saturated fat, trans fat, and cholesterol. This can help prevent high cholesterol. Avoiding processed and prepared foods. Counting your daily carbohydrate intake.  Lifestyle If you drink alcohol: Limit how much you have to: 0-1 drink a day for women who are not pregnant. 0-2 drinks a day for men. Know how much alcohol is in your drink. In the [place] one drink equals one 12 oz bottle of beer ( ), one 5 oz glass of wine ( ), or one 1 oz glass of hard liquor (44mL). Do not use any products that contain nicotine or tobacco. These products include cigarettes, chewing tobacco, and vaping devices, such as e-cigarettes. If you need help quitting, ask your health care provider. Avoid secondhand smoke. Do not use drugs. Activity  Try to stay at a healthy weight. Get at least 30 minutes of exercise on most days, such as: Fast walking. Biking. Swimming. Medicines Take over-the-counter and prescription medicines only as told by your health care provider. Aspirin or blood thinners  (antiplatelets or anticoagulants) may be recommended to reduce your risk of forming blood clots that can lead to stroke. Avoid taking birth control pills. Talk to your health care provider about the risks of taking birth control pills if: You are over 14 years old. You smoke. You get very bad headaches. You have had a blood clot. Where to find more information American Stroke Association: www.strokeassociation.org Get help right away if: You or a loved one has any symptoms of a stroke. "BE FAST" is an easy way to remember the main warning signs of a stroke: B - Balance. Signs are dizziness, sudden trouble walking, or loss of balance. E - Eyes. Signs are trouble seeing or a sudden change in vision. F - Face. Signs are sudden weakness or numbness of the face, or the face or eyelid drooping on one side. A - Arms. Signs are weakness or numbness in an arm. This happens suddenly and usually on one side of the body. S - Speech. Signs are sudden trouble speaking, slurred speech, or trouble understanding what people say. T - Time. Time to call emergency services. Write down what time symptoms started. You or a loved one has other signs of a stroke, such as: A sudden, severe headache with no known cause. Nausea or vomiting. Seizure. These symptoms may represent a serious problem that is an emergency. Do not wait to see if the symptoms will go away. Get medical help right away. Call your local emergency services (911 in the [place] Do not drive yourself to the hospital. Summary You can help to prevent a stroke by eating healthy, exercising, not  smoking, limiting alcohol intake, and managing any medical conditions you may have. Do not use any products that contain nicotine or tobacco. These include cigarettes, chewing tobacco, and vaping devices, such as e-cigarettes. If you need help quitting, ask your health care provider. Remember "BE FAST" for warning signs of a stroke. Get help right away if you or a  loved one has any of these signs. This information is not intended to replace advice given to you by your health care provider. Make sure you discuss any questions you have with your health care provider. Document Revised: 03/14/2022 Document Reviewed: 03/14/2022 Elsevier Patient Education  2024 [employer].

## 2022-10-31 NOTE — Telephone Encounter (Signed)
Note entered in error

## 2022-10-31 NOTE — Therapy (Signed)
OUTPATIENT SPEECH LANGUAGE PATHOLOGY  TREATMENT NOTE     Patient Name: [name] MRN: [id number] DOB:06/02/1965, 58 y.o., male Today's Date: 10/31/2022   PCP: Jon Buddy, NP REFERRING PROVIDER: Mariam Dollar, PA     End of Session - 10/31/22 1449     Visit Number 46    Number of Visits 49    Date for SLP Re-Evaluation 11/17/22    Authorization Type [employer]    Progress Note Due on Visit 50    SLP Start Time 1445    SLP Stop Time  1530    SLP Time Calculation (min) 45 min    Activity Tolerance Patient tolerated treatment well              Past Medical History:  Diagnosis Date   Hypertension    LVH (left ventricular hypertrophy)    Myocardial infarction (HCC)    Vitamin D deficiency    Past Surgical History:  Procedure Laterality Date   ANTERIOR CRUCIATE LIGAMENT REPAIR     COLONOSCOPY WITH PROPOFOL N/A 04/11/2018   Procedure: COLONOSCOPY WITH PROPOFOL;  Surgeon: Toledo, Boykin Nearing, MD;  Location: [facility] ENDOSCOPY;  Service: Gastroenterology;  Laterality: N/A;   GASTRIC RESECTION     IR ANGIO INTRA EXTRACRAN SEL INTERNAL CAROTID UNI L MOD SED  08/15/2022   IR US GUIDE VASC ACCESS RIGHT  08/15/2022   LAPAROTOMY     LOOP RECORDER INSERTION N/A 08/18/2022   Procedure: LOOP RECORDER INSERTION;  Surgeon: Jon Lemming, MD;  Location: [facility] INVASIVE CV LAB;  Service: Cardiovascular;  Laterality: N/A;   RADIOLOGY WITH ANESTHESIA N/A 08/14/2022   Procedure: IR WITH ANESTHESIA;  Surgeon: Jon Cotton, MD;  Location: [facility] OR;  Service: Radiology;  Laterality: N/A;   TEE WITHOUT CARDIOVERSION N/A 08/18/2022   Procedure: TRANSESOPHAGEAL ECHOCARDIOGRAM;  Surgeon: Jon Fus, MD;  Location: [facility] INVASIVE CV LAB;  Service: Cardiovascular;  Laterality: N/A;   Patient Active Problem List   Diagnosis Date Noted   Essential hypertension, benign 08/18/2022   Hyperlipidemia 08/18/2022   DM (diabetes mellitus), type 2 (HCC) 08/18/2022   Obesity  (BMI 30-39.9) 08/18/2022   CAD (coronary artery disease) 08/18/2022   OSA (obstructive sleep apnea) 08/18/2022   Acute ischemic left MCA stroke (HCC) 08/18/2022   Acute ischemic right MCA stroke (HCC) 08/14/2022   Hypertensive urgency 04/27/2017   ONSET DATE: 08/14/2022; date of referral 08/23/2022    REFERRING DIAG: M57.846 (ICD-10-CM) - Cerebral infarction due to unspecified occlusion or stenosis of left middle cerebral artery   THERAPY DIAG:  Aphasia   Apraxia   Cerebral infarction due to unspecified occlusion or stenosis of left middle cerebral artery (HCC)   Rationale for Evaluation and Treatment Rehabilitation   SUBJECTIVE:      PERTINENT HISTORY:  [name] is 57 year old male who presented to the [facility] ED on 08/14/2022 noted by wife to be very weak and confused and halfway enter out of the bathtub at home. Symptoms were sudden in onset. Last known well 9 PM. Patient presented with global aphasia. Code stroke activated. CT head negative for hemorrhage, aspects of 9, mild hyperdensity left MCA could indicate thrombus. Tenecteplase administered. CTA showed left M1 occlusion and neurointerventional radiology contacted. Patient transferred to [facility] for thrombectomy. On route he was noted to have periorbital welts and uvular swelling. Was concern of possible angioedema secondary to tenecteplase. He was intubated for airway protection and underwent cerebral angiogram by Dr. Tommie Warner. Recanalization  of the left MCA vascular tree noted. Critical care medicine consulted for management and placed on low-dose Neo-Synephrine infusion. Extubated on 4/23. 2D echo with EF of approximately 60 to 65% with trivial MVR. LDL 96, hemoglobin A1c 6.3%. He was started on heparin subcutaneously for VTE prophylaxis.     DIAGNOSTIC FINDINGS:    CT Head - 08/14/2022 Mild hyperdensity of the left MCA   CT Head and Neck Angio - 08/14/2022 Proximal occlusion of the left MCA M1 segment  with poor collateralization throughout the MCA territory.   IR Angio - 08/15/2022 Interval recanalization of the left M1/MCA occlusion seen on prior CT angiogram likely as a result of TNK administration. Therefore, no intervention performed.   MRI 08/15/2022 Acute cortical infarcts along the left frontal operculum and left anterior insula with additional small area of acute cortical infarct within left parietal lobe. Other areas of faint cortical DWI hyperintensity throughout the left MCA territory, including the left putamen, may reflect additional areas of infarct or ischemia. 2. Loss of the left transverse sinus flow void is favored to reflect slow flow. If clinical suspicion for thrombosis, CT venography could be performed for further characterization.     PAIN:  Are you having pain? No   FALLS: Has patient fallen in last 6 months?  No   LIVING ENVIRONMENT: Lives with: lives with their family Lives in: House/apartment   PLOF:  Level of assistance: Independent with ADLs, Independent with IADLs Employment: Full-time employment     PATIENT GOALS    to be able to communicate again   SUBJECTIVE STATEMENT:  Pt forgot to bring in his flashcard Pt accompanied by: pt's sons, [name] and [name]   OBJECTIVE:   TODAY'S TREATMENT:  Skilled treatment session focused on pt's communication goals. SLP facilitated session by providing the following interventions:  Responsive naming - Mod I when using written language, moderate assistance (phonemic cues) for expressive to achieve ~75% speech intelligibility     PATIENT EDUCATION: Education details: see above Person educated: Patient and wife Education method: Explanation Education comprehension: verbalized understanding and needs further education   HOME EXERCISE PROGRAM:  Talk Path Therapy tasks As well as singing to R&B Song Drills to promote accurate motor movements     GOALS:   Goals reviewed with patient? Yes   SHORT  TERM GOALS: Target date: 10 sessions   UPDATED GOALS: 09/12/2022 UPDATED GOALS: 10/17/2022 The patient will name common household objects at 80% accuracy given frequent maximum verbal and frequent maximum phonemic cues. Goal status: INITIAL Goal Status: MET for names of family members Goal Updated: With moderate assistance, pt will name common household objects at 80% accuracy. ONGOING   2.  The patient will identify the correct word given 2 choices at 80% accuracy given frequent maximal visual cues in order to increase ability to comprehend simple instructions. Goal status: INITIAL Goal status: Ongoing; ONGOING   3.  The patient will follow simple body commands presented auditorily at 80% accuracy given frequent maximal visual cues. Goal status: INITIAL Goal status: Ongoing; ONGOING   4.  The patient will say functional words (e.g., water, toilet) at 50% accuracy given frequent maximal phonemic placement cues in order to communicate ability to communicate basic wants and needs. Goal status: INITIAL Goal status: MET Goal updated: The patient will say functional words (e.g., water, toilet) at 75% accuracy given frequent maximal phonemic placement cues in order to communicate ability to communicate basic wants and needs. MET     LONG TERM  GOALS: Target date: 11/17/2022   Updated: 09/12/2022  Updated: 10/17/2022 Pt will use multimodal means of communication to express basic biographical information about himself with moderate assistance.  Baseline:  Goal status: INITIAL Goal status: ONGOING; progress made    2.  Pt will demonstrate > 90% speech intelligibility when reading orientation information.  Baseline:  Goal status: INITIAL Goal status: ONGOING; progress made     ASSESSMENT:   CLINICAL IMPRESSION: Pt is a 57 year old right handed male who was seen today for a speech-language treatment d/t moderate transcortical sensory aphasia. Pt continues to make good progress. His son  reports that pt "talks more now than he did before the stroke." See treatment note for more details.   Of note pt's yes/no responses are more accurate in addition, more spontaneous words are observed each session in response to conversation.   OBJECTIVE IMPAIRMENTS include expressive language, receptive language, apraxia, and reading comprehension and ability to produce written language . These impairments are limiting patient from return to work, managing medications, managing appointments, managing finances, household responsibilities, ADLs/IADLs, and effectively communicating at home and in community. Factors affecting potential to achieve goals and functional outcome are severity of impairments. Patient will benefit from skilled SLP services to address above impairments and improve overall function.   REHAB POTENTIAL: Excellent   PLAN: SLP FREQUENCY: 3x/week to 4xweek   SLP DURATION: 12 weeks   PLANNED INTERVENTIONS: Language facilitation, Cueing hierachy, Functional tasks, Multimodal communication approach, SLP instruction and feedback, Compensatory strategies, and Patient/family education       Jacobs Golab B. Dreama Saa, M.S., CCC-SLP, Tree surgeon Certified Brain Injury Specialist [facility]  [facility] Office [phone] Ascom [phone] Fax [phone]

## 2022-11-01 ENCOUNTER — Ambulatory Visit: Payer: BC Managed Care – PPO | Admitting: Speech Pathology

## 2022-11-01 DIAGNOSIS — I63512 Cerebral infarction due to unspecified occlusion or stenosis of left middle cerebral artery: Secondary | ICD-10-CM

## 2022-11-01 DIAGNOSIS — R4701 Aphasia: Secondary | ICD-10-CM

## 2022-11-01 NOTE — Therapy (Unsigned)
OUTPATIENT SPEECH LANGUAGE PATHOLOGY  TREATMENT NOTE     Patient Name: [name] MRN: [id number] DOB:1966/04/22, 57 y.o., male Today's Date: 11/01/2022   PCP: Myrene Buddy, NP REFERRING PROVIDER: Mariam Dollar, PA     End of Session - 11/01/22 1557     Visit Number 47    Number of Visits 49    Date for SLP Re-Evaluation 11/17/22    Authorization Type [employer]    Progress Note Due on Visit 50    SLP Start Time 0800    SLP Stop Time  0845    SLP Time Calculation (min) 45 min    Activity Tolerance Patient tolerated treatment well              Past Medical History:  Diagnosis Date   Hypertension    LVH (left ventricular hypertrophy)    Myocardial infarction (HCC)    Vitamin D deficiency    Past Surgical History:  Procedure Laterality Date   ANTERIOR CRUCIATE LIGAMENT REPAIR     COLONOSCOPY WITH PROPOFOL N/A 04/11/2018   Procedure: COLONOSCOPY WITH PROPOFOL;  Surgeon: Toledo, Boykin Nearing, MD;  Location: [facility] ENDOSCOPY;  Service: Gastroenterology;  Laterality: N/A;   GASTRIC RESECTION     IR ANGIO INTRA EXTRACRAN SEL INTERNAL CAROTID UNI L MOD SED  08/15/2022   IR US GUIDE VASC ACCESS RIGHT  08/15/2022   LAPAROTOMY     LOOP RECORDER INSERTION N/A 08/18/2022   Procedure: LOOP RECORDER INSERTION;  Surgeon: Regan Lemming, MD;  Location: [facility] INVASIVE CV LAB;  Service: Cardiovascular;  Laterality: N/A;   RADIOLOGY WITH ANESTHESIA N/A 08/14/2022   Procedure: IR WITH ANESTHESIA;  Surgeon: Julieanne Cotton, MD;  Location: [facility] OR;  Service: Radiology;  Laterality: N/A;   TEE WITHOUT CARDIOVERSION N/A 08/18/2022   Procedure: TRANSESOPHAGEAL ECHOCARDIOGRAM;  Surgeon: [name], MD;  Location: [facility] INVASIVE CV LAB;  Service: Cardiovascular;  Laterality: N/A;   Patient Active Problem List   Diagnosis Date Noted   Essential hypertension, benign 08/18/2022   Hyperlipidemia 08/18/2022   DM (diabetes mellitus), type 2 (HCC) 08/18/2022   Obesity  (BMI 30-39.9) 08/18/2022   CAD (coronary artery disease) 08/18/2022   OSA (obstructive sleep apnea) 08/18/2022   Acute ischemic left MCA stroke (HCC) 08/18/2022   Acute ischemic right MCA stroke (HCC) 08/14/2022   Hypertensive urgency 04/27/2017   ONSET DATE: 08/14/2022; date of referral 08/23/2022    REFERRING DIAG: W09.811 (ICD-10-CM) - Cerebral infarction due to unspecified occlusion or stenosis of left middle cerebral artery   THERAPY DIAG:  Aphasia   Apraxia   Cerebral infarction due to unspecified occlusion or stenosis of left middle cerebral artery (HCC)   Rationale for Evaluation and Treatment Rehabilitation   SUBJECTIVE:      PERTINENT HISTORY:  [name] is 57 year old male who presented to the [facility] ED on 08/14/2022 noted by wife to be very weak and confused and halfway enter out of the bathtub at home. Symptoms were sudden in onset. Last known well 9 PM. Patient presented with global aphasia. Code stroke activated. CT head negative for hemorrhage, aspects of 9, mild hyperdensity left MCA could indicate thrombus. Tenecteplase administered. CTA showed left M1 occlusion and neurointerventional radiology contacted. Patient transferred to [facility] for thrombectomy. On route he was noted to have periorbital welts and uvular swelling. Was concern of possible angioedema secondary to tenecteplase. He was intubated for airway protection and underwent cerebral angiogram by Dr. Tommie Sams. Recanalization  of the left MCA vascular tree noted. Critical care medicine consulted for management and placed on low-dose Neo-Synephrine infusion. Extubated on 4/23. 2D echo with EF of approximately 60 to 65% with trivial MVR. LDL 96, hemoglobin A1c 6.3%. He was started on heparin subcutaneously for VTE prophylaxis.     DIAGNOSTIC FINDINGS:    CT Head - 08/14/2022 Mild hyperdensity of the left MCA   CT Head and Neck Angio - 08/14/2022 Proximal occlusion of the left MCA M1 segment  with poor collateralization throughout the MCA territory.   IR Angio - 08/15/2022 Interval recanalization of the left M1/MCA occlusion seen on prior CT angiogram likely as a result of TNK administration. Therefore, no intervention performed.   MRI 08/15/2022 Acute cortical infarcts along the left frontal operculum and left anterior insula with additional small area of acute cortical infarct within left parietal lobe. Other areas of faint cortical DWI hyperintensity throughout the left MCA territory, including the left putamen, may reflect additional areas of infarct or ischemia. 2. Loss of the left transverse sinus flow void is favored to reflect slow flow. If clinical suspicion for thrombosis, CT venography could be performed for further characterization.     PAIN:  Are you having pain? No   FALLS: Has patient fallen in last 6 months?  No   LIVING ENVIRONMENT: Lives with: lives with their family Lives in: House/apartment   PLOF:  Level of assistance: Independent with ADLs, Independent with IADLs Employment: Full-time employment     PATIENT GOALS    to be able to communicate again   SUBJECTIVE STATEMENT:  Pt forgot to bring in his flashcard Pt accompanied by: pt's sons, [name] and [name]   OBJECTIVE:   TODAY'S TREATMENT:  Skilled treatment session focused on pt's communication goals. SLP facilitated session by providing the following interventions:  Targeted word finding/retrieval thru categories by having pt name items within specified category given restraints of specified letter. Pt benefited from Min A to achieve 14/15 categories using written responses  SLP further facilitated session by providing Mod A to name category member given it's description - using written responses  Pt was noted to have increased accuracy with yes/no responses, requires less heavy phonemic stimulation and had several off-the-cuff intelligible appropriate responses.    PATIENT  EDUCATION: Education details: see above Person educated: Patient, pt's son Education method: Explanation Education comprehension: verbalized understanding and needs further education   HOME EXERCISE PROGRAM:  Talk Path Therapy tasks As well as singing to R&B Song Drills to promote accurate motor movements     GOALS:   Goals reviewed with patient? Yes   SHORT TERM GOALS: Target date: 10 sessions   UPDATED GOALS: 09/12/2022 UPDATED GOALS: 10/17/2022 The patient will name common household objects at 80% accuracy given frequent maximum verbal and frequent maximum phonemic cues. Goal status: INITIAL Goal Status: MET for names of family members Goal Updated: With moderate assistance, pt will name common household objects at 80% accuracy. ONGOING   2.  The patient will identify the correct word given 2 choices at 80% accuracy given frequent maximal visual cues in order to increase ability to comprehend simple instructions. Goal status: INITIAL Goal status: Ongoing; ONGOING   3.  The patient will follow simple body commands presented auditorily at 80% accuracy given frequent maximal visual cues. Goal status: INITIAL Goal status: Ongoing; ONGOING   4.  The patient will say functional words (e.g., water, toilet) at 50% accuracy given frequent maximal phonemic placement cues in order to  communicate ability to communicate basic wants and needs. Goal status: INITIAL Goal status: MET Goal updated: The patient will say functional words (e.g., water, toilet) at 75% accuracy given frequent maximal phonemic placement cues in order to communicate ability to communicate basic wants and needs. MET     LONG TERM GOALS: Target date: 11/17/2022   Updated: 09/12/2022  Updated: 10/17/2022 Pt will use multimodal means of communication to express basic biographical information about himself with moderate assistance.  Baseline:  Goal status: INITIAL Goal status: ONGOING; progress made    2.  Pt  will demonstrate > 90% speech intelligibility when reading orientation information.  Baseline:  Goal status: INITIAL Goal status: ONGOING; progress made     ASSESSMENT:   CLINICAL IMPRESSION: Pt is a 57 year old right handed male who was seen today for a speech-language treatment d/t moderate transcortical sensory aphasia. Pt continues to make good progress. See treatment note for more details.   OBJECTIVE IMPAIRMENTS include expressive language, receptive language, apraxia, and reading comprehension and ability to produce written language . These impairments are limiting patient from return to work, managing medications, managing appointments, managing finances, household responsibilities, ADLs/IADLs, and effectively communicating at home and in community. Factors affecting potential to achieve goals and functional outcome are severity of impairments. Patient will benefit from skilled SLP services to address above impairments and improve overall function.   REHAB POTENTIAL: Excellent   PLAN: SLP FREQUENCY: 3x/week to 4xweek   SLP DURATION: 12 weeks   PLANNED INTERVENTIONS: Language facilitation, Cueing hierachy, Functional tasks, Multimodal communication approach, SLP instruction and feedback, Compensatory strategies, and Patient/family education       Bret Vanessen B. Dreama Saa, M.S., CCC-SLP, Tree surgeon Certified Brain Injury Specialist [facility]  [facility] Rehabilitation Services Office [phone] Ascom [phone] Fax [phone]

## 2022-11-02 ENCOUNTER — Ambulatory Visit: Payer: BC Managed Care – PPO | Admitting: Speech Pathology

## 2022-11-02 DIAGNOSIS — R4701 Aphasia: Secondary | ICD-10-CM | POA: Diagnosis not present

## 2022-11-02 DIAGNOSIS — I63512 Cerebral infarction due to unspecified occlusion or stenosis of left middle cerebral artery: Secondary | ICD-10-CM

## 2022-11-02 NOTE — Therapy (Signed)
OUTPATIENT SPEECH LANGUAGE PATHOLOGY  TREATMENT NOTE     Patient Name: [name] MRN: [id number] DOB:1965-06-06, 57 y.o., male Today's Date: 11/02/2022   PCP: Myrene Buddy, NP REFERRING PROVIDER: Mariam Dollar, PA     End of Session - 11/02/22 1700     Visit Number 48    Number of Visits 49    Date for SLP Re-Evaluation 11/17/22    Authorization Type [employer]    Progress Note Due on Visit 50    SLP Start Time 1315    SLP Stop Time  1400    SLP Time Calculation (min) 45 min    Activity Tolerance Patient tolerated treatment well              Past Medical History:  Diagnosis Date   Hypertension    LVH (left ventricular hypertrophy)    Myocardial infarction (HCC)    Vitamin D deficiency    Past Surgical History:  Procedure Laterality Date   ANTERIOR CRUCIATE LIGAMENT REPAIR     COLONOSCOPY WITH PROPOFOL N/A 04/11/2018   Procedure: COLONOSCOPY WITH PROPOFOL;  Surgeon: Toledo, Boykin Nearing, MD;  Location: [facility] ENDOSCOPY;  Service: Gastroenterology;  Laterality: N/A;   GASTRIC RESECTION     IR ANGIO INTRA EXTRACRAN SEL INTERNAL CAROTID UNI L MOD SED  08/15/2022   IR US GUIDE VASC ACCESS RIGHT  08/15/2022   LAPAROTOMY     LOOP RECORDER INSERTION N/A 08/18/2022   Procedure: LOOP RECORDER INSERTION;  Surgeon: Regan Lemming, MD;  Location: [facility] INVASIVE CV LAB;  Service: Cardiovascular;  Laterality: N/A;   RADIOLOGY WITH ANESTHESIA N/A 08/14/2022   Procedure: IR WITH ANESTHESIA;  Surgeon: Julieanne Cotton, MD;  Location: [facility] OR;  Service: Radiology;  Laterality: N/A;   TEE WITHOUT CARDIOVERSION N/A 08/18/2022   Procedure: TRANSESOPHAGEAL ECHOCARDIOGRAM;  Surgeon: [name], MD;  Location: [facility] INVASIVE CV LAB;  Service: Cardiovascular;  Laterality: N/A;   Patient Active Problem List   Diagnosis Date Noted   Essential hypertension, benign 08/18/2022   Hyperlipidemia 08/18/2022   DM (diabetes mellitus), type 2 (HCC) 08/18/2022    Obesity (BMI 30-39.9) 08/18/2022   CAD (coronary artery disease) 08/18/2022   OSA (obstructive sleep apnea) 08/18/2022   Acute ischemic left MCA stroke (HCC) 08/18/2022   Acute ischemic right MCA stroke (HCC) 08/14/2022   Hypertensive urgency 04/27/2017   ONSET DATE: 08/14/2022; date of referral 08/23/2022    REFERRING DIAG: Q65.784 (ICD-10-CM) - Cerebral infarction due to unspecified occlusion or stenosis of left middle cerebral artery   THERAPY DIAG:  Aphasia   Apraxia   Cerebral infarction due to unspecified occlusion or stenosis of left middle cerebral artery (HCC)   Rationale for Evaluation and Treatment Rehabilitation   SUBJECTIVE:      PERTINENT HISTORY:  [name] is 57 year old male who presented to the [facility] ED on 08/14/2022 noted by wife to be very weak and confused and halfway enter out of the bathtub at home. Symptoms were sudden in onset. Last known well 9 PM. Patient presented with global aphasia. Code stroke activated. CT head negative for hemorrhage, aspects of 9, mild hyperdensity left MCA could indicate thrombus. Tenecteplase administered. CTA showed left M1 occlusion and neurointerventional radiology contacted. Patient transferred to [facility] for thrombectomy. On route he was noted to have periorbital welts and uvular swelling. Was concern of possible angioedema secondary to tenecteplase. He was intubated for airway protection and underwent cerebral angiogram by Dr. Tommie Sams. Recanalization  of the left MCA vascular tree noted. Critical care medicine consulted for management and placed on low-dose Neo-Synephrine infusion. Extubated on 4/23. 2D echo with EF of approximately 60 to 65% with trivial MVR. LDL 96, hemoglobin A1c 6.3%. He was started on heparin subcutaneously for VTE prophylaxis.     DIAGNOSTIC FINDINGS:    CT Head - 08/14/2022 Mild hyperdensity of the left MCA   CT Head and Neck Angio - 08/14/2022 Proximal occlusion of the left MCA M1  segment with poor collateralization throughout the MCA territory.   IR Angio - 08/15/2022 Interval recanalization of the left M1/MCA occlusion seen on prior CT angiogram likely as a result of TNK administration. Therefore, no intervention performed.   MRI 08/15/2022 Acute cortical infarcts along the left frontal operculum and left anterior insula with additional small area of acute cortical infarct within left parietal lobe. Other areas of faint cortical DWI hyperintensity throughout the left MCA territory, including the left putamen, may reflect additional areas of infarct or ischemia. 2. Loss of the left transverse sinus flow void is favored to reflect slow flow. If clinical suspicion for thrombosis, CT venography could be performed for further characterization.     PAIN:  Are you having pain? No   FALLS: Has patient fallen in last 6 months?  No   LIVING ENVIRONMENT: Lives with: lives with their family Lives in: House/apartment   PLOF:  Level of assistance: Independent with ADLs, Independent with IADLs Employment: Full-time employment     PATIENT GOALS    to be able to communicate again   SUBJECTIVE STATEMENT:  Pt talkative today, accompanied by his wife Pt accompanied by: his wife   OBJECTIVE:   TODAY'S TREATMENT:  Skilled treatment session focused on pt's communication goals. SLP facilitated session by providing the following interventions:  Targeted word finding/retrieval thru categories by having pt name items within specified category - restaurants, kitchen utensils, things found at a gym   Pt was noted to have increased accuracy with yes/no responses, requires less heavy phonemic stimulation and had several off-the-cuff intelligible appropriate responses.    PATIENT EDUCATION: Education details: see above Person educated: Patient, pt's son Education method: Explanation Education comprehension: verbalized understanding and needs further education   HOME  EXERCISE PROGRAM:  Talk Path Therapy tasks As well as singing to R&B Song Drills to promote accurate motor movements     GOALS:   Goals reviewed with patient? Yes   SHORT TERM GOALS: Target date: 10 sessions   UPDATED GOALS: 09/12/2022 UPDATED GOALS: 10/17/2022 The patient will name common household objects at 80% accuracy given frequent maximum verbal and frequent maximum phonemic cues. Goal status: INITIAL Goal Status: MET for names of family members Goal Updated: With moderate assistance, pt will name common household objects at 80% accuracy. ONGOING   2.  The patient will identify the correct word given 2 choices at 80% accuracy given frequent maximal visual cues in order to increase ability to comprehend simple instructions. Goal status: INITIAL Goal status: Ongoing; ONGOING   3.  The patient will follow simple body commands presented auditorily at 80% accuracy given frequent maximal visual cues. Goal status: INITIAL Goal status: Ongoing; ONGOING   4.  The patient will say functional words (e.g., water, toilet) at 50% accuracy given frequent maximal phonemic placement cues in order to communicate ability to communicate basic wants and needs. Goal status: INITIAL Goal status: MET Goal updated: The patient will say functional words (e.g., water, toilet) at 75% accuracy given frequent  maximal phonemic placement cues in order to communicate ability to communicate basic wants and needs. MET     LONG TERM GOALS: Target date: 11/17/2022   Updated: 09/12/2022  Updated: 10/17/2022 Pt will use multimodal means of communication to express basic biographical information about himself with moderate assistance.  Baseline:  Goal status: INITIAL Goal status: ONGOING; progress made    2.  Pt will demonstrate > 90% speech intelligibility when reading orientation information.  Baseline:  Goal status: INITIAL Goal status: ONGOING; progress made     ASSESSMENT:   CLINICAL  IMPRESSION: Pt is a 57 year old right handed male who was seen today for a speech-language treatment d/t moderate transcortical sensory aphasia. Pt continues to make good progress. See treatment note for more details.   OBJECTIVE IMPAIRMENTS include expressive language, receptive language, apraxia, and reading comprehension and ability to produce written language . These impairments are limiting patient from return to work, managing medications, managing appointments, managing finances, household responsibilities, ADLs/IADLs, and effectively communicating at home and in community. Factors affecting potential to achieve goals and functional outcome are severity of impairments. Patient will benefit from skilled SLP services to address above impairments and improve overall function.   REHAB POTENTIAL: Excellent   PLAN: SLP FREQUENCY: 3x/week to 4xweek   SLP DURATION: 12 weeks   PLANNED INTERVENTIONS: Language facilitation, Cueing hierachy, Functional tasks, Multimodal communication approach, SLP instruction and feedback, Compensatory strategies, and Patient/family education       Allyna Pittsley B. Dreama Saa, M.S., CCC-SLP, Tree surgeon Certified Brain Injury Specialist [facility]  [facility] Rehabilitation Services Office [phone] Ascom [phone] Fax [phone]

## 2022-11-03 ENCOUNTER — Encounter: Payer: Self-pay | Admitting: Cardiovascular Disease

## 2022-11-03 ENCOUNTER — Ambulatory Visit: Payer: BC Managed Care – PPO | Admitting: Speech Pathology

## 2022-11-03 ENCOUNTER — Ambulatory Visit: Payer: BC Managed Care – PPO | Admitting: Cardiovascular Disease

## 2022-11-03 VITALS — BP 146/100 | HR 80 | Ht 72.0 in | Wt 200.8 lb

## 2022-11-03 DIAGNOSIS — I253 Aneurysm of heart: Secondary | ICD-10-CM | POA: Diagnosis not present

## 2022-11-03 DIAGNOSIS — I129 Hypertensive chronic kidney disease with stage 1 through stage 4 chronic kidney disease, or unspecified chronic kidney disease: Secondary | ICD-10-CM

## 2022-11-03 DIAGNOSIS — R482 Apraxia: Secondary | ICD-10-CM

## 2022-11-03 DIAGNOSIS — Q2112 Patent foramen ovale: Secondary | ICD-10-CM

## 2022-11-03 DIAGNOSIS — R4701 Aphasia: Secondary | ICD-10-CM | POA: Diagnosis not present

## 2022-11-03 DIAGNOSIS — I63512 Cerebral infarction due to unspecified occlusion or stenosis of left middle cerebral artery: Secondary | ICD-10-CM

## 2022-11-03 MED ORDER — CLOPIDOGREL BISULFATE 75 MG PO TABS: 75.0000 mg | ORAL_TABLET | Freq: Every day | ORAL | 7 refills | Status: DC

## 2022-11-03 NOTE — Patient Instructions (Signed)
Medication Instructions:  REFILLED Clopidogrel 75mg  daily *If you need a refill on your cardiac medications before your next appointment, please call your pharmacy*   Lab Work: NONE- Please ensure we receive copies of CBC and Basic/Complete Metabolic Panel from your PCP If you have labs (blood work) drawn today and your tests are completely normal, you will receive your results only by: MyChart Message (if you have MyChart) OR A paper copy in the mail If you have any lab test that is abnormal or we need to change your treatment, we will call you to review the results.  Testing/Procedures: PFO Closure 12/30/22  Follow-Up: At [facility], you and your health needs are our priority.  As part of our continuing mission to provide you with exceptional heart care, we have created designated Provider Care Teams.  These Care Teams include your primary Cardiologist (physician) and Advanced Practice Providers (APPs -  Physician Assistants and Nurse Practitioners) who all work together to provide you with the care you need, when you need it.  Your next appointment:   Wednesday, 12/21/22 @11 :10am  Provider:   Georgie Chard, NP or Carlean Jews, PA-C

## 2022-11-03 NOTE — Therapy (Signed)
OUTPATIENT SPEECH LANGUAGE PATHOLOGY  TREATMENT NOTE RE-CERTIFICATION REQUEST     Patient Name: [name] MRN: [id number] DOB:1966/04/24, 57 y.o., male Today's Date: 11/03/2022   PCP: Myrene Buddy, NP REFERRING PROVIDER: Mariam Dollar, PA     End of Session - 11/03/22 0850     Visit Number 49    Number of Visits 97    Date for SLP Re-Evaluation 01/26/23    Authorization Type [employer]    Progress Note Due on Visit 50    SLP Start Time 0845    SLP Stop Time  0930    SLP Time Calculation (min) 45 min    Activity Tolerance Patient tolerated treatment well              Past Medical History:  Diagnosis Date   Hypertension    LVH (left ventricular hypertrophy)    Myocardial infarction (HCC)    Vitamin D deficiency    Past Surgical History:  Procedure Laterality Date   ANTERIOR CRUCIATE LIGAMENT REPAIR     COLONOSCOPY WITH PROPOFOL N/A 04/11/2018   Procedure: COLONOSCOPY WITH PROPOFOL;  Surgeon: Toledo, Boykin Nearing, MD;  Location: [facility] ENDOSCOPY;  Service: Gastroenterology;  Laterality: N/A;   GASTRIC RESECTION     IR ANGIO INTRA EXTRACRAN SEL INTERNAL CAROTID UNI L MOD SED  08/15/2022   IR US GUIDE VASC ACCESS RIGHT  08/15/2022   LAPAROTOMY     LOOP RECORDER INSERTION N/A 08/18/2022   Procedure: LOOP RECORDER INSERTION;  Surgeon: Regan Lemming, MD;  Location: [facility] INVASIVE CV LAB;  Service: Cardiovascular;  Laterality: N/A;   RADIOLOGY WITH ANESTHESIA N/A 08/14/2022   Procedure: IR WITH ANESTHESIA;  Surgeon: Julieanne Cotton, MD;  Location: [facility] OR;  Service: Radiology;  Laterality: N/A;   TEE WITHOUT CARDIOVERSION N/A 08/18/2022   Procedure: TRANSESOPHAGEAL ECHOCARDIOGRAM;  Surgeon: [name], MD;  Location: [facility] INVASIVE CV LAB;  Service: Cardiovascular;  Laterality: N/A;   Patient Active Problem List   Diagnosis Date Noted   Essential hypertension, benign 08/18/2022   Hyperlipidemia 08/18/2022   DM (diabetes mellitus), type 2  (HCC) 08/18/2022   Obesity (BMI 30-39.9) 08/18/2022   CAD (coronary artery disease) 08/18/2022   OSA (obstructive sleep apnea) 08/18/2022   Acute ischemic left MCA stroke (HCC) 08/18/2022   Acute ischemic right MCA stroke (HCC) 08/14/2022   Hypertensive urgency 04/27/2017   ONSET DATE: 08/14/2022; date of referral 08/23/2022    REFERRING DIAG: V78.469 (ICD-10-CM) - Cerebral infarction due to unspecified occlusion or stenosis of left middle cerebral artery   THERAPY DIAG:  Aphasia   Apraxia   Cerebral infarction due to unspecified occlusion or stenosis of left middle cerebral artery (HCC)   Rationale for Evaluation and Treatment Rehabilitation   SUBJECTIVE:      PERTINENT HISTORY:  [name] is 57 year old male who presented to the [facility] ED on 08/14/2022 noted by wife to be very weak and confused and halfway enter out of the bathtub at home. Symptoms were sudden in onset. Last known well 9 PM. Patient presented with global aphasia. Code stroke activated. CT head negative for hemorrhage, aspects of 9, mild hyperdensity left MCA could indicate thrombus. Tenecteplase administered. CTA showed left M1 occlusion and neurointerventional radiology contacted. Patient transferred to [facility] for thrombectomy. On route he was noted to have periorbital welts and uvular swelling. Was concern of possible angioedema secondary to tenecteplase. He was intubated for airway protection and underwent cerebral angiogram by Dr. Joana Reamer  Quay Burow. Recanalization of the left MCA vascular tree noted. Critical care medicine consulted for management and placed on low-dose Neo-Synephrine infusion. Extubated on 4/23. 2D echo with EF of approximately 60 to 65% with trivial MVR. LDL 96, hemoglobin A1c 6.3%. He was started on heparin subcutaneously for VTE prophylaxis.     DIAGNOSTIC FINDINGS:    CT Head - 08/14/2022 Mild hyperdensity of the left MCA   CT Head and Neck Angio - 08/14/2022 Proximal occlusion  of the left MCA M1 segment with poor collateralization throughout the MCA territory.   IR Angio - 08/15/2022 Interval recanalization of the left M1/MCA occlusion seen on prior CT angiogram likely as a result of TNK administration. Therefore, no intervention performed.   MRI 08/15/2022 Acute cortical infarcts along the left frontal operculum and left anterior insula with additional small area of acute cortical infarct within left parietal lobe. Other areas of faint cortical DWI hyperintensity throughout the left MCA territory, including the left putamen, may reflect additional areas of infarct or ischemia. 2. Loss of the left transverse sinus flow void is favored to reflect slow flow. If clinical suspicion for thrombosis, CT venography could be performed for further characterization.     PAIN:  Are you having pain? No   FALLS: Has patient fallen in last 6 months?  No   LIVING ENVIRONMENT: Lives with: lives with their family Lives in: House/apartment   PLOF:  Level of assistance: Independent with ADLs, Independent with IADLs Employment: Full-time employment     PATIENT GOALS    to be able to communicate again   SUBJECTIVE STATEMENT:  Pt has friend in town form [place] Pt accompanied by: his wife   OBJECTIVE:   TODAY'S TREATMENT:  Skilled treatment session focused on pt's communication goals. SLP facilitated session by providing the following interventions:  Targeted word finding/retrieval thru categories by having pt name items within functional categories - events people celebrate, streets in his neighborhood, parts of a car when writing list.   Pt with proficient use of Text to Speech, he is less reliant on imitative prompts.     PATIENT EDUCATION: Education details: see above Person educated: Patient, pt's son Education method: Explanation Education comprehension: verbalized understanding and needs further education   HOME EXERCISE PROGRAM:  Lists of  functional categories   GOALS:   Goals reviewed with patient? Yes   SHORT TERM GOALS: Target date: 10 sessions   UPDATED GOALS: 09/12/2022 UPDATED GOALS: 10/17/2022 UPDATED GOALS: 11/03/2022 The patient will name common household objects at 80% accuracy given frequent maximum verbal and frequent maximum phonemic cues. Goal status: INITIAL Goal Status: MET for names of family members Goal Updated: With moderate assistance, pt will name common household objects at 80% accuracy. ONGOING; MET Goal Updated: (11/03/2022): With minimal assistance, pt will name common objects with > 90%.   2.  The patient will identify the correct word given 2 choices at 80% accuracy given frequent maximal visual cues in order to increase ability to comprehend simple instructions. Goal status: INITIAL Goal status: Ongoing; ONGOING; MET   3.  The patient will follow simple body commands presented auditorily at 80% accuracy given frequent maximal visual cues. Goal status: INITIAL Goal status: Ongoing; ONGOING; MET   4.  The patient will say functional words (e.g., water, toilet) at 50% accuracy given frequent maximal phonemic placement cues in order to communicate ability to communicate basic wants and needs. Goal status: INITIAL Goal status: MET Goal updated: The patient will say functional words (e.g.,  water, toilet) at 75% accuracy given frequent maximal phonemic placement cues in order to communicate ability to communicate basic wants and needs. MET  5.  The patient will follow simple body commands presented auditorily at 80% accuracy given frequent maximal visual cues. Goal status: INITIAL      LONG TERM GOALS: Target date: 01/26/2023   Updated: 09/12/2022  Updated: 10/17/2022  Updated: 11/03/2022 Pt will use multimodal means of communication to express basic biographical information about himself with moderate assistance.  Baseline:  Goal status: INITIAL Goal status: ONGOING; progress made;  MET Updated Goal (11/03/2022): Pt will use multimodal means of communication to express basic biographical information about himself, activities within his day and his family with minimal assistance.     2.  Pt will demonstrate > 90% speech intelligibility when reading orientation information.  Baseline:  Goal status: INITIAL Goal status: ONGOING; progress made  3. Pt will answer semi-complex yes/no questions with > 75% accuracy given minimal cues.   Baseline: 75% with simple yes/no questions  Goal status: INITIAL     ASSESSMENT:   CLINICAL IMPRESSION: Pt is a 57 year old right handed male who was seen today for a speech-language treatment d/t moderate transcortical sensory aphasia. Pt continues to make good progress and he has met several of his STG and LTGs. Will request re-certification to continue progress towards functional communication. See treatment note for more details.   OBJECTIVE IMPAIRMENTS include expressive language, receptive language, apraxia, and reading comprehension and ability to produce written language . These impairments are limiting patient from return to work, managing medications, managing appointments, managing finances, household responsibilities, ADLs/IADLs, and effectively communicating at home and in community. Factors affecting potential to achieve goals and functional outcome are severity of impairments. Patient will benefit from skilled SLP services to address above impairments and improve overall function.   REHAB POTENTIAL: Excellent   PLAN: SLP FREQUENCY: 3x/week to 4xweek   SLP DURATION: 12 weeks   PLANNED INTERVENTIONS: Language facilitation, Cueing hierachy, Functional tasks, Multimodal communication approach, SLP instruction and feedback, Compensatory strategies, and Patient/family education       Royale Lennartz B. Dreama Saa, M.S., CCC-SLP, Tree surgeon Certified Brain Injury Specialist [facility]  [facility] Rehabilitation Services Office [phone] [place] [phone] Fax [phone]

## 2022-11-03 NOTE — Progress Notes (Signed)
Cardiology Office Note:    Date:  11/03/2022   ID:  [name], DOB [birth year], MRN [id number]  PCP:  Myrene Buddy, NP   [facility] Providers Cardiologist:  None     Referring MD: Myrene Buddy, *   Chief Complaint  Patient presents with   PFO Consult    History of Present Illness:    [name] is a 57 y.o. male referred for PFO evaluation by Dr Pearlean Brownie.   The patient is here with his wife today.  He is a 28 year old gentleman who suffered a large stroke in April 2024.  He has a history of hypertension and stage IV chronic kidney disease.  He has had prediabetes.  He developed sudden onset of confusion, right-sided weakness, and an inability to talk.  He was treated with TNK and transferred to [facility] for emergent thrombectomy, but intervention was not required due to recanalization following TNK.  MRI showed findings consistent with acute stroke with cortical infarcts along the left frontal lobe and left parietal lobe.  2D echo showed normal LVEF.  Transesophageal echo showed a PFO with right to left shunt confirmed by bubble study.  DVT study was negative.  A loop recorder was implanted and this has not demonstrated any arrhythmia to date.  There has been no atrial fibrillation.  Transcranial Doppler study showed evidence of a large PFO with a curtain effect during Valsalva.  The patient has clinically improved from his stroke, but still has a significant speech deficit.  His comprehension is improving.  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  He denies chest pain or shortness of breath.  Past Medical History:  Diagnosis Date   Hypertension    LVH (left ventricular hypertrophy)    Myocardial infarction (HCC)    Stroke (cerebrum) (HCC)    Vitamin D deficiency     Past Surgical History:  Procedure Laterality Date   ANTERIOR CRUCIATE LIGAMENT REPAIR     COLONOSCOPY WITH PROPOFOL N/A 04/11/2018    Procedure: COLONOSCOPY WITH PROPOFOL;  Surgeon: Toledo, Boykin Nearing, MD;  Location: [facility] ENDOSCOPY;  Service: Gastroenterology;  Laterality: N/A;   GASTRIC RESECTION     IR ANGIO INTRA EXTRACRAN SEL INTERNAL CAROTID UNI L MOD SED  08/15/2022   IR US GUIDE VASC ACCESS RIGHT  08/15/2022   LAPAROTOMY     LOOP RECORDER INSERTION N/A 08/18/2022   Procedure: LOOP RECORDER INSERTION;  Surgeon: Regan Lemming, MD;  Location: [facility] INVASIVE CV LAB;  Service: Cardiovascular;  Laterality: N/A;   RADIOLOGY WITH ANESTHESIA N/A 08/14/2022   Procedure: IR WITH ANESTHESIA;  Surgeon: Julieanne Cotton, MD;  Location: [facility] OR;  Service: Radiology;  Laterality: N/A;   TEE WITHOUT CARDIOVERSION N/A 08/18/2022   Procedure: TRANSESOPHAGEAL ECHOCARDIOGRAM;  Surgeon: [name], MD;  Location: [facility] INVASIVE CV LAB;  Service: Cardiovascular;  Laterality: N/A;    Current Medications: Current Meds  Medication Sig   amLODipine (NORVASC) 10 MG tablet Take 10 mg by mouth daily.   atorvastatin (LIPITOR) 20 MG tablet Take 1 tablet (20 mg total) by mouth daily.   carvedilol (COREG) 12.5 MG tablet Take 1 tablet (12.5 mg total) by mouth 2 (two) times daily.   cholecalciferol (VITAMIN D3) 25 MCG (1000 UNIT) tablet Take 1,000 Units by mouth daily.   [DISCONTINUED] chlorthalidone (HYGROTON) 25 MG tablet Take 12.5 mg by mouth every morning.   [DISCONTINUED] clopidogrel (PLAVIX) 75 MG tablet Take 1 tablet (  75 mg total) by mouth daily.     Allergies:   Amitriptyline, Tenecteplase, Elavil [amitriptyline hcl], and Lisinopril   Social History   Socioeconomic History   Marital status: Married    Spouse name: Not on file   Number of children: Not on file   Years of education: Not on file   Highest education level: Not on file  Occupational History   Not on file  Tobacco Use   Smoking status: Unknown   Smokeless tobacco: Never  Vaping Use   Vaping status: Never Used  Substance and Sexual Activity   Alcohol use: Not  Currently   Drug use: Not Currently   Sexual activity: Not on file  Other Topics Concern   Not on file  Social History Narrative   ** Merged History Encounter **       Social Determinants of Health   Financial Resource Strain: Not on file  Food Insecurity: No Food Insecurity (08/17/2022)   Hunger Vital Sign    Worried About Running Out of Food in the Last Year: Never true    Ran Out of Food in the Last Year: Never true  Transportation Needs: No Transportation Needs (08/17/2022)   PRAPARE - Administrator, Civil Service (Medical): No    Lack of Transportation (Non-Medical): No  Physical Activity: Not on file  Stress: Not on file  Social Connections: Not on file     Family History: The patient's family history includes Diabetes Mellitus II in his father and mother.  ROS:   Please see the history of present illness.    All other systems reviewed and are negative.  EKGs/Labs/Other Studies Reviewed:    The following studies were reviewed today: Cardiac Studies & Procedures       ECHOCARDIOGRAM  ECHOCARDIOGRAM COMPLETE 08/15/2022  Narrative ECHOCARDIOGRAM REPORT    Patient Name:   [name] Date of Exam: 08/15/2022 Medical Rec #:  [id number]              Height:       72.0 in Accession #:    [id number]             Weight:       216.0 lb Date of Birth:  1965-11-22             BSA:          2.202 m Patient Age:    56 years               BP:           105/79 mmHg Patient Gender: M                      HR:           79 bpm. Exam Location:  Inpatient  Procedure: 2D Echo, Cardiac Doppler and Color Doppler  Indications:    Stroke I63.9  History:        Patient has no prior history of Echocardiogram examinations. Stroke; Risk Factors:Hypertension.  Sonographer:    Lucendia Herrlich Referring Phys: [id number] ASHISH ARORA  IMPRESSIONS   1. Left ventricular ejection fraction, by estimation, is 60 to 65%. The left ventricle has normal function. The  left ventricle has no regional wall motion abnormalities. Left ventricular diastolic parameters were normal. 2. Right ventricular systolic function is normal. The right ventricular size is normal. 3. The mitral valve is normal in structure. Trivial mitral valve regurgitation. No evidence of  mitral stenosis. 4. The aortic valve is normal in structure. Aortic valve regurgitation is not visualized. No aortic stenosis is present. 5. The inferior vena cava is normal in size with greater than 50% respiratory variability, suggesting right atrial pressure of 3 mmHg. 6. Technically limited study due to poor sound wave transmission.  FINDINGS Left Ventricle: Left ventricular ejection fraction, by estimation, is 60 to 65%. The left ventricle has normal function. The left ventricle has no regional wall motion abnormalities. The left ventricular internal cavity size was normal in size. There is no left ventricular hypertrophy. Left ventricular diastolic parameters were normal.  Right Ventricle: The right ventricular size is normal. No increase in right ventricular wall thickness. Right ventricular systolic function is normal.  Left Atrium: Left atrial size was normal in size.  Right Atrium: Right atrial size was normal in size.  Pericardium: There is no evidence of pericardial effusion.  Mitral Valve: The mitral valve is normal in structure. There is mild calcification of the mitral valve leaflet(s). Trivial mitral valve regurgitation. No evidence of mitral valve stenosis.  Tricuspid Valve: The tricuspid valve is normal in structure. Tricuspid valve regurgitation is not demonstrated. No evidence of tricuspid stenosis.  Aortic Valve: The aortic valve is normal in structure. Aortic valve regurgitation is not visualized. No aortic stenosis is present. Aortic valve peak gradient measures 3.1 mmHg.  Pulmonic Valve: The pulmonic valve was normal in structure. Pulmonic valve regurgitation is not visualized. No  evidence of pulmonic stenosis.  Aorta: The aortic root is normal in size and structure.  Venous: The inferior vena cava is normal in size with greater than 50% respiratory variability, suggesting right atrial pressure of 3 mmHg.  IAS/Shunts: No atrial level shunt detected by color flow Doppler.   LEFT VENTRICLE PLAX 2D LVIDd:         4.30 cm Diastology LVIDs:         3.20 cm LV e' medial:    4.29 cm/s LV PW:         0.80 cm LV E/e' medial:  11.7 LV IVS:        1.00 cm LV e' lateral:   7.36 cm/s LV E/e' lateral: 6.8   RIGHT VENTRICLE             IVC RV S prime:     11.10 cm/s  IVC diam: 1.70 cm TAPSE (M-mode): 1.3 cm  LEFT ATRIUM           Index       RIGHT ATRIUM          Index LA diam:      2.80 cm 1.27 cm/m  RA Area:     9.72 cm LA Vol (A2C): 16.4 ml 7.45 ml/m  RA Volume:   16.00 ml 7.27 ml/m AORTIC VALVE AV Vmax:      88.00 cm/s AV Peak Grad: 3.1 mmHg LVOT Vmax:    71.50 cm/s LVOT Vmean:   45.767 cm/s LVOT VTI:     0.120 m  MITRAL VALVE MV Area (PHT): 3.77 cm    SHUNTS MV Decel Time: 201 msec    Systemic VTI: 0.12 m MV E velocity: 50.30 cm/s MV A velocity: 65.10 cm/s MV E/A ratio:  0.77  Arvilla Meres MD Electronically signed by Arvilla Meres MD Signature Date/Time: 08/15/2022/11:36:00 AM    Final   TEE  ECHO TEE 08/18/2022  Narrative TRANSESOPHOGEAL ECHO REPORT    Patient Name:   [name] Pennella Date of Exam: 08/18/2022 Medical  Rec #:  [id number]              Height:       72.0 in Accession #:    [id number]             Weight:       216.0 lb Date of Birth:  06-26-1965             BSA:          2.202 m Patient Age:    56 years               BP:           154/108 mmHg Patient Gender: M                      HR:           80 bpm. Exam Location:  Inpatient  Procedure: 3D Echo, Transesophageal Echo, Cardiac Doppler and Color Doppler  Indications:     Stroke  History:         Patient has prior history of Echocardiogram examinations,  most recent 08/15/2022. Stroke.  Sonographer:     Sheralyn Boatman RDCS Referring Phys:  [id number] SHENG L HALEY Diagnosing Phys: [place]  PROCEDURE: After discussion of the risks and benefits of a TEE, an informed consent was obtained from the patient. The transesophogeal probe was passed without difficulty through the esophogus of the patient. Local oropharyngeal anesthetic was provided with Cetacaine. Sedation performed by different physician. The patient was monitored while under deep sedation. Anesthestetic sedation was provided intravenously by Anesthesiology: 303mg  of Propofol, 60mg  of Lidocaine. The patient's vital signs; including heart rate, blood pressure, and oxygen saturation; remained stable throughout the procedure. The patient developed no complications during the procedure.  IMPRESSIONS   1. Left ventricular ejection fraction, by estimation, is 60 to 65%. The left ventricle has normal function. 2. Right ventricular systolic function is normal. The right ventricular size is normal. 3. No left atrial/left atrial appendage thrombus was detected. 4. Mild proplapse of the anterior leaflet. No evidence of mitral valve regurgitation. 5. The aortic valve is normal in structure. Aortic valve regurgitation is not visualized. 6. Agitated saline contrast bubble study was positive with shunting observed within 3-6 cardiac cycles suggestive of interatrial shunt. There is a small patent foramen ovale.  Conclusion(s)/Recommendation(s): Small PFO. PFO area approximately 0.11 cm2.  FINDINGS Left Ventricle: Left ventricular ejection fraction, by estimation, is 60 to 65%. The left ventricle has normal function. The left ventricular internal cavity size was normal in size.  Right Ventricle: The right ventricular size is normal. Right ventricular systolic function is normal.  Left Atrium: No left atrial/left atrial appendage thrombus was detected.  Pericardium: There is no evidence of pericardial  effusion.  Mitral Valve: Mild proplapse of the anterior leaflet. No evidence of mitral valve regurgitation.  Tricuspid Valve: Tricuspid valve regurgitation is not demonstrated.  Aortic Valve: The aortic valve is normal in structure. Aortic valve regurgitation is not visualized.  Pulmonic Valve: Pulmonic valve regurgitation is not visualized.  Aorta: The aortic root and ascending aorta are structurally normal, with no evidence of dilitation.  IAS/Shunts: Agitated saline contrast bubble study was positive with shunting observed within 3-6 cardiac cycles suggestive of interatrial shunt. A small patent foramen ovale is detected.  Additional Comments: Spectral Doppler performed.  Carolan Clines Electronically signed by Carolan Clines Signature Date/Time: 08/18/2022/5:34:20 PM    Final  EKG:   EKG Interpretation Date/Time:  Thursday November 03 2022 11:29:28 EDT Ventricular Rate:  80 PR Interval:  208 QRS Duration:  94 QT Interval:  390 QTC Calculation: 449 R Axis:   0  Text Interpretation: Normal sinus rhythm Normal ECG Confirmed by Tonny Bollman [phone]) on 11/03/2022 11:41:53 AM    Recent Labs: 08/19/2022: ALT 18 08/22/2022: BUN 49; Creatinine, Ser 3.22; Hemoglobin 12.2; Platelets 234; Potassium 4.1; Sodium 137  Recent Lipid Panel    Component Value Date/Time   CHOL 172 08/15/2022 0424   TRIG 125 08/16/2022 0647   HDL 36 (L) 08/15/2022 0424   CHOLHDL 4.8 08/15/2022 0424   VLDL 40 08/15/2022 0424   LDLCALC 96 08/15/2022 0424     Risk Assessment/Calculations:            Physical Exam:    VS:  BP (!) 146/100 Comment: Right arm 130/90  Pulse 80   Ht 6' (1.829 m)   Wt 200 lb 12.8 oz (91.1 kg)   SpO2 99%   BMI 27.23 kg/m     Wt Readings from Last 3 Encounters:  11/03/22 200 lb 12.8 oz (91.1 kg)  10/31/22 200 lb (90.7 kg)  09/30/22 201 lb (91.2 kg)     GEN:  Well nourished, well developed in no acute distress HEENT: Normal NECK: No JVD; No carotid  bruits LYMPHATICS: No lymphadenopathy CARDIAC: RRR, no murmurs, rubs, gallops RESPIRATORY:  Clear to auscultation without rales, wheezing or rhonchi  ABDOMEN: Soft, non-tender, non-distended MUSCULOSKELETAL:  No edema; No deformity  SKIN: Warm and dry NEUROLOGIC:  Alert and oriented x 3 expressive aphasia PSYCHIATRIC:  Normal affect   ASSESSMENT:    1. Malignant hypertension (arteriolar nephrosclerosis), stage 1-4 or unspecified chronic kidney disease   2. PFO with atrial septal aneurysm    PLAN:    In order of problems listed above:  Blood pressure control at home has been within acceptable limits.  He brings in home readings today.  These are reviewed.  He will continue his current medicines. The patient's TEE images are personally reviewed.  He has a moderate-sized PFO with hypermobility of the interatrial septum.  His bubble study is positive and he had a curtain effect with Valsalva on his transcranial Doppler suggestive of a large PFO.  We reviewed the data regarding transcatheter PFO closure in patients with cryptogenic stroke.  The patient's stroke presentation is reviewed as well as the imaging studies that were performed in the hospital.  MRI reports findings consistent with a cardioembolic event.  The patient's implantable loop recorder transmissions have been reviewed and there has been no atrial fibrillation.  It is reasonable to consider transcatheter PFO closure.  I reviewed the risks, indications, and alternatives to transcatheter PFO closure.  I demonstrated the Amplatzer PFO occluder device to the patient and his wife today.  He understands that risks include vascular injury, bleeding, stroke, myocardial infarction, device erosion, device embolization, cardiac injury, cardiac tamponade, need for emergency pericardiocentesis, need for emergency cardiac surgery, and death.  He understands a combined risk of serious complication is 1% or less.  The patient is tolerating his medical  therapy well.  After shared decision-making conversation, he would like to proceed with transcatheter PFO closure at the next available time.           Medication Adjustments/Labs and Tests Ordered: Current medicines are reviewed at length with the patient today.  Concerns regarding medicines are outlined above.  Orders Placed This Encounter  Procedures  EKG 12-Lead   Meds ordered this encounter  Medications   clopidogrel (PLAVIX) 75 MG tablet    Sig: Take 1 tablet (75 mg total) by mouth daily.    Dispense:  30 tablet    Refill:  7    Patient Instructions  Medication Instructions:  REFILLED Clopidogrel 75mg  daily *If you need a refill on your cardiac medications before your next appointment, please call your pharmacy*   Lab Work: NONE- Please ensure we receive copies of CBC and Basic/Complete Metabolic Panel from your PCP If you have labs (blood work) drawn today and your tests are completely normal, you will receive your results only by: MyChart Message (if you have MyChart) OR A paper copy in the mail If you have any lab test that is abnormal or we need to change your treatment, we will call you to review the results.  Testing/Procedures: PFO Closure 12/30/22  Follow-Up: At [facility], you and your health needs are our priority.  As part of our continuing mission to provide you with exceptional heart care, we have created designated Provider Care Teams.  These Care Teams include your primary Cardiologist (physician) and Advanced Practice Providers (APPs -  Physician Assistants and Nurse Practitioners) who all work together to provide you with the care you need, when you need it.  Your next appointment:   Wednesday, 12/21/22 @11 :10am  Provider:   Georgie Chard, NP or Carlean Jews, PA-C      Signed, Tonny Bollman, MD  11/03/2022 1:07 PM    [facility]

## 2022-11-07 ENCOUNTER — Ambulatory Visit: Payer: BC Managed Care – PPO | Admitting: Speech Pathology

## 2022-11-07 DIAGNOSIS — I63512 Cerebral infarction due to unspecified occlusion or stenosis of left middle cerebral artery: Secondary | ICD-10-CM

## 2022-11-07 DIAGNOSIS — R4701 Aphasia: Secondary | ICD-10-CM | POA: Diagnosis not present

## 2022-11-07 NOTE — Therapy (Signed)
OUTPATIENT SPEECH LANGUAGE PATHOLOGY  TREATMENT NOTE 10th VISIT PROGRESS NOTE     Patient Name: [name] MRN: [id number] DOB:03/02/1966, 57 y.o., male Today's Date: 11/07/2022   PCP: Myrene Buddy, NP REFERRING PROVIDER: Mariam Dollar, PA  Speech Therapy Progress Note  Dates of Reporting Period: 10/18/2022 to 11/07/2022  Objective: Patient has been seen for 10 speech therapy sessions this reporting period targeting pt's aphasia. Goals recently reviewed d/t re-certification requests during last session. See below for great progress Patient is making progress toward LTGs and met 4 STGs this reporting period. See skilled intervention, clinical impressions, and goals below for details.    End of Session - 11/07/22 0915     Visit Number 50    Number of Visits 97    Date for SLP Re-Evaluation 01/26/23    Authorization Type [employer]    Progress Note Due on Visit 50    SLP Start Time 0845    SLP Stop Time  0930    SLP Time Calculation (min) 45 min    Activity Tolerance Patient tolerated treatment well              Past Medical History:  Diagnosis Date   Hypertension    LVH (left ventricular hypertrophy)    Myocardial infarction (HCC)    Vitamin D deficiency    Past Surgical History:  Procedure Laterality Date   ANTERIOR CRUCIATE LIGAMENT REPAIR     COLONOSCOPY WITH PROPOFOL N/A 04/11/2018   Procedure: COLONOSCOPY WITH PROPOFOL;  Surgeon: Toledo, Boykin Nearing, MD;  Location: [facility] ENDOSCOPY;  Service: Gastroenterology;  Laterality: N/A;   GASTRIC RESECTION     IR ANGIO INTRA EXTRACRAN SEL INTERNAL CAROTID UNI L MOD SED  08/15/2022   IR US GUIDE VASC ACCESS RIGHT  08/15/2022   LAPAROTOMY     LOOP RECORDER INSERTION N/A 08/18/2022   Procedure: LOOP RECORDER INSERTION;  Surgeon: Regan Lemming, MD;  Location: [facility] INVASIVE CV LAB;  Service: Cardiovascular;  Laterality: N/A;   RADIOLOGY WITH ANESTHESIA N/A 08/14/2022   Procedure: IR WITH  ANESTHESIA;  Surgeon: Julieanne Cotton, MD;  Location: [facility] OR;  Service: Radiology;  Laterality: N/A;   TEE WITHOUT CARDIOVERSION N/A 08/18/2022   Procedure: TRANSESOPHAGEAL ECHOCARDIOGRAM;  Surgeon: [name], MD;  Location: [facility] INVASIVE CV LAB;  Service: Cardiovascular;  Laterality: N/A;   Patient Active Problem List   Diagnosis Date Noted   Essential hypertension, benign 08/18/2022   Hyperlipidemia 08/18/2022   DM (diabetes mellitus), type 2 (HCC) 08/18/2022   Obesity (BMI 30-39.9) 08/18/2022   CAD (coronary artery disease) 08/18/2022   OSA (obstructive sleep apnea) 08/18/2022   Acute ischemic left MCA stroke (HCC) 08/18/2022   Acute ischemic right MCA stroke (HCC) 08/14/2022   Hypertensive urgency 04/27/2017   ONSET DATE: 08/14/2022; date of referral 08/23/2022    REFERRING DIAG: D66.440 (ICD-10-CM) - Cerebral infarction due to unspecified occlusion or stenosis of left middle cerebral artery   THERAPY DIAG:  Aphasia   Apraxia   Cerebral infarction due to unspecified occlusion or stenosis of left middle cerebral artery (HCC)   Rationale for Evaluation and Treatment Rehabilitation   SUBJECTIVE:      PERTINENT HISTORY:  [name] is 57 year old male who presented to the [facility] ED on 08/14/2022 noted by wife to be very weak and confused and halfway enter out of the bathtub at home. Symptoms were sudden in onset. Last known well 9 PM. Patient presented with global aphasia. Code stroke  activated. CT head negative for hemorrhage, aspects of 9, mild hyperdensity left MCA could indicate thrombus. Tenecteplase administered. CTA showed left M1 occlusion and neurointerventional radiology contacted. Patient transferred to [facility] for thrombectomy. On route he was noted to have periorbital welts and uvular swelling. Was concern of possible angioedema secondary to tenecteplase. He was intubated for airway protection and underwent cerebral angiogram by Dr. Tommie Sams.  Recanalization of the left MCA vascular tree noted. Critical care medicine consulted for management and placed on low-dose Neo-Synephrine infusion. Extubated on 4/23. 2D echo with EF of approximately 60 to 65% with trivial MVR. LDL 96, hemoglobin A1c 6.3%. He was started on heparin subcutaneously for VTE prophylaxis.     DIAGNOSTIC FINDINGS:    CT Head - 08/14/2022 Mild hyperdensity of the left MCA   CT Head and Neck Angio - 08/14/2022 Proximal occlusion of the left MCA M1 segment with poor collateralization throughout the MCA territory.   IR Angio - 08/15/2022 Interval recanalization of the left M1/MCA occlusion seen on prior CT angiogram likely as a result of TNK administration. Therefore, no intervention performed.   MRI 08/15/2022 Acute cortical infarcts along the left frontal operculum and left anterior insula with additional small area of acute cortical infarct within left parietal lobe. Other areas of faint cortical DWI hyperintensity throughout the left MCA territory, including the left putamen, may reflect additional areas of infarct or ischemia. 2. Loss of the left transverse sinus flow void is favored to reflect slow flow. If clinical suspicion for thrombosis, CT venography could be performed for further characterization.     PAIN:  Are you having pain? No   FALLS: Has patient fallen in last 6 months?  No   LIVING ENVIRONMENT: Lives with: lives with their family Lives in: House/apartment   PLOF:  Level of assistance: Independent with ADLs, Independent with IADLs Employment: Full-time employment     PATIENT GOALS    to be able to communicate again   SUBJECTIVE STATEMENT:  Pt with appropriate responses to social greeting in the hallway today Pt accompanied by: pt's sons   OBJECTIVE:   TODAY'S TREATMENT:  Skilled treatment session focused on pt's communication goals. SLP facilitated session by providing the following interventions:  Targeted word  finding/retrieval thru use of crossword puzzle targeting functional information with increased cognitive load. SLP utilized [facility] 2 Unit 1: Attention and Concentration pp 43-44. Pt benefited from Min A to complete with use of Text to Speech to aid in spelling and > 95% speech intelligibility following verbal cue at the word level.     PATIENT EDUCATION: Education details: see above Person educated: Patient, pt's son Education method: Explanation Education comprehension: verbalized understanding and needs further education   HOME EXERCISE PROGRAM:  Lists of functional categories   GOALS:   Goals reviewed with patient? Yes   SHORT TERM GOALS: Target date: 10 sessions   UPDATED GOALS: 09/12/2022 UPDATED GOALS: 10/17/2022 UPDATED GOALS: 11/03/2022 The patient will name common household objects at 80% accuracy given frequent maximum verbal and frequent maximum phonemic cues. Goal status: INITIAL Goal Status: MET for names of family members Goal Updated: With moderate assistance, pt will name common household objects at 80% accuracy. ONGOING; MET Goal Updated: (11/03/2022): With minimal assistance, pt will name common objects with > 90%.   2.  The patient will identify the correct word given 2 choices at 80% accuracy given frequent maximal visual cues in order to increase ability to comprehend simple instructions. Goal status: INITIAL  Goal status: Ongoing; ONGOING; MET   3.  The patient will follow simple body commands presented auditorily at 80% accuracy given frequent maximal visual cues. Goal status: INITIAL Goal status: Ongoing; ONGOING; MET   4.  The patient will say functional words (e.g., water, toilet) at 50% accuracy given frequent maximal phonemic placement cues in order to communicate ability to communicate basic wants and needs. Goal status: INITIAL Goal status: MET Goal updated: The patient will say functional words (e.g., water, toilet) at 75% accuracy given frequent  maximal phonemic placement cues in order to communicate ability to communicate basic wants and needs. MET  5.  The patient will follow simple body commands presented auditorily at 80% accuracy given frequent maximal visual cues. Goal status: INITIAL      LONG TERM GOALS: Target date: 01/26/2023   Updated: 09/12/2022  Updated: 10/17/2022  Updated: 11/03/2022 Pt will use multimodal means of communication to express basic biographical information about himself with moderate assistance.  Baseline:  Goal status: INITIAL Goal status: ONGOING; progress made; MET Updated Goal (11/03/2022): Pt will use multimodal means of communication to express basic biographical information about himself, activities within his day and his family with minimal assistance.     2.  Pt will demonstrate > 90% speech intelligibility when reading orientation information.  Baseline:  Goal status: INITIAL Goal status: ONGOING; progress made  3. Pt will answer semi-complex yes/no questions with > 75% accuracy given minimal cues.   Baseline: 75% with simple yes/no questions  Goal status: INITIAL     ASSESSMENT:   CLINICAL IMPRESSION: Pt is a 57 year old right handed male who was seen today for a speech-language treatment d/t moderate transcortical sensory aphasia. Pt continues to make good progress and he has met several of his STG and LTGs. See treatment note for more details.   OBJECTIVE IMPAIRMENTS include expressive language, receptive language, apraxia, and reading comprehension and ability to produce written language . These impairments are limiting patient from return to work, managing medications, managing appointments, managing finances, household responsibilities, ADLs/IADLs, and effectively communicating at home and in community. Factors affecting potential to achieve goals and functional outcome are severity of impairments. Patient will benefit from skilled SLP services to address above impairments and  improve overall function.   REHAB POTENTIAL: Excellent   PLAN: SLP FREQUENCY: 3x/week to 4xweek   SLP DURATION: 12 weeks   PLANNED INTERVENTIONS: Language facilitation, Cueing hierachy, Functional tasks, Multimodal communication approach, SLP instruction and feedback, Compensatory strategies, and Patient/family education       Haliey Romberg B. Dreama Saa, M.S., CCC-SLP, Tree surgeon Certified Brain Injury Specialist [facility]  [facility] Services Office [phone] Ascom [phone] Fax [phone]

## 2022-11-08 ENCOUNTER — Ambulatory Visit: Payer: BC Managed Care – PPO | Admitting: Speech Pathology

## 2022-11-08 DIAGNOSIS — R4701 Aphasia: Secondary | ICD-10-CM | POA: Diagnosis not present

## 2022-11-08 DIAGNOSIS — I63512 Cerebral infarction due to unspecified occlusion or stenosis of left middle cerebral artery: Secondary | ICD-10-CM

## 2022-11-08 NOTE — Therapy (Signed)
OUTPATIENT SPEECH LANGUAGE PATHOLOGY  TREATMENT NOTE     Patient Name: [name] MRN: [id number] DOB:[birth year], 57 y.o., male Today's Date: 11/08/2022   PCP: Myrene Buddy, NP REFERRING PROVIDER: Mariam Dollar, PA    End of Session - 11/08/22 0915     Visit Number 51    Number of Visits 97    Date for SLP Re-Evaluation 01/26/23    Authorization Type [employer]    Progress Note Due on Visit 60    SLP Start Time 0800    SLP Stop Time  0845    SLP Time Calculation (min) 45 min    Activity Tolerance Patient tolerated treatment well              Past Medical History:  Diagnosis Date   Hypertension    LVH (left ventricular hypertrophy)    Myocardial infarction (HCC)    Vitamin D deficiency    Past Surgical History:  Procedure Laterality Date   ANTERIOR CRUCIATE LIGAMENT REPAIR     COLONOSCOPY WITH PROPOFOL N/A 04/11/2018   Procedure: COLONOSCOPY WITH PROPOFOL;  Surgeon: Toledo, Boykin Nearing, MD;  Location: [facility] ENDOSCOPY;  Service: Gastroenterology;  Laterality: N/A;   GASTRIC RESECTION     IR ANGIO INTRA EXTRACRAN SEL INTERNAL CAROTID UNI L MOD SED  08/15/2022   IR US GUIDE VASC ACCESS RIGHT  08/15/2022   LAPAROTOMY     LOOP RECORDER INSERTION N/A 08/18/2022   Procedure: LOOP RECORDER INSERTION;  Surgeon: Regan Lemming, MD;  Location: [facility] INVASIVE CV LAB;  Service: Cardiovascular;  Laterality: N/A;   RADIOLOGY WITH ANESTHESIA N/A 08/14/2022   Procedure: IR WITH ANESTHESIA;  Surgeon: Julieanne Cotton, MD;  Location: [facility] OR;  Service: Radiology;  Laterality: N/A;   TEE WITHOUT CARDIOVERSION N/A 08/18/2022   Procedure: TRANSESOPHAGEAL ECHOCARDIOGRAM;  Surgeon: [name], MD;  Location: [facility] INVASIVE CV LAB;  Service: Cardiovascular;  Laterality: N/A;   Patient Active Problem List   Diagnosis Date Noted   Essential hypertension, benign 08/18/2022   Hyperlipidemia 08/18/2022   DM (diabetes mellitus), type 2 (HCC) 08/18/2022   Obesity  (BMI 30-39.9) 08/18/2022   CAD (coronary artery disease) 08/18/2022   OSA (obstructive sleep apnea) 08/18/2022   Acute ischemic left MCA stroke (HCC) 08/18/2022   Acute ischemic right MCA stroke (HCC) 08/14/2022   Hypertensive urgency 04/27/2017   ONSET DATE: 08/14/2022; date of referral 08/23/2022    REFERRING DIAG: W09.811 (ICD-10-CM) - Cerebral infarction due to unspecified occlusion or stenosis of left middle cerebral artery   THERAPY DIAG:  Aphasia   Apraxia   Cerebral infarction due to unspecified occlusion or stenosis of left middle cerebral artery (HCC)   Rationale for Evaluation and Treatment Rehabilitation   SUBJECTIVE:      PERTINENT HISTORY:  [name] is 57 year old male who presented to the [facility] ED on 08/14/2022 noted by wife to be very weak and confused and halfway enter out of the bathtub at home. Symptoms were sudden in onset. Last known well 9 PM. Patient presented with global aphasia. Code stroke activated. CT head negative for hemorrhage, aspects of 9, mild hyperdensity left MCA could indicate thrombus. Tenecteplase administered. CTA showed left M1 occlusion and neurointerventional radiology contacted. Patient transferred to [facility] for thrombectomy. On route he was noted to have periorbital welts and uvular swelling. Was concern of possible angioedema secondary to tenecteplase. He was intubated for airway protection and underwent cerebral angiogram by Dr. Tommie Sams. Recanalization of  the left MCA vascular tree noted. Critical care medicine consulted for management and placed on low-dose Neo-Synephrine infusion. Extubated on 4/23. 2D echo with EF of approximately 60 to 65% with trivial MVR. LDL 96, hemoglobin A1c 6.3%. He was started on heparin subcutaneously for VTE prophylaxis.     DIAGNOSTIC FINDINGS:    CT Head - 08/14/2022 Mild hyperdensity of the left MCA   CT Head and Neck Angio - 08/14/2022 Proximal occlusion of the left MCA M1 segment  with poor collateralization throughout the MCA territory.   IR Angio - 08/15/2022 Interval recanalization of the left M1/MCA occlusion seen on prior CT angiogram likely as a result of TNK administration. Therefore, no intervention performed.   MRI 08/15/2022 Acute cortical infarcts along the left frontal operculum and left anterior insula with additional small area of acute cortical infarct within left parietal lobe. Other areas of faint cortical DWI hyperintensity throughout the left MCA territory, including the left putamen, may reflect additional areas of infarct or ischemia. 2. Loss of the left transverse sinus flow void is favored to reflect slow flow. If clinical suspicion for thrombosis, CT venography could be performed for further characterization.     PAIN:  Are you having pain? No   FALLS: Has patient fallen in last 6 months?  No   LIVING ENVIRONMENT: Lives with: lives with their family Lives in: House/apartment   PLOF:  Level of assistance: Independent with ADLs, Independent with IADLs Employment: Full-time employment     PATIENT GOALS    to be able to communicate again   SUBJECTIVE STATEMENT:  Pt with appropriate responses to social greeting in the hallway today Pt accompanied by: pt's sons   OBJECTIVE:   TODAY'S TREATMENT:  Skilled treatment session focused on pt's communication goals. SLP facilitated session by providing the following interventions:  Pt brought in completed homework. Targeted word finding/retrieval thru use of crossword puzzle targeting functional information with increased cognitive load.   SLP utilized [facility] 2 Unit 1: Attention and Concentration pp 42. Pt benefited from Min A to complete with use of Text to Speech to aid in spelling and > 95% speech intelligibility following verbal cue at the word level.   Of note, pt also spelled to dictation x 4 independently.      PATIENT EDUCATION: Education details: see above Person educated:  Patient, pt's son Education method: Explanation Education comprehension: verbalized understanding and needs further education   HOME EXERCISE PROGRAM:  Lists of functional categories   GOALS:   Goals reviewed with patient? Yes   SHORT TERM GOALS: Target date: 10 sessions   UPDATED GOALS: 09/12/2022 UPDATED GOALS: 10/17/2022 UPDATED GOALS: 11/03/2022 The patient will name common household objects at 80% accuracy given frequent maximum verbal and frequent maximum phonemic cues. Goal status: INITIAL Goal Status: MET for names of family members Goal Updated: With moderate assistance, pt will name common household objects at 80% accuracy. ONGOING; MET Goal Updated: (11/03/2022): With minimal assistance, pt will name common objects with > 90%.   2.  The patient will identify the correct word given 2 choices at 80% accuracy given frequent maximal visual cues in order to increase ability to comprehend simple instructions. Goal status: INITIAL Goal status: Ongoing; ONGOING; MET   3.  The patient will follow simple body commands presented auditorily at 80% accuracy given frequent maximal visual cues. Goal status: INITIAL Goal status: Ongoing; ONGOING; MET   4.  The patient will say functional words (e.g., water, toilet) at 50% accuracy given  frequent maximal phonemic placement cues in order to communicate ability to communicate basic wants and needs. Goal status: INITIAL Goal status: MET Goal updated: The patient will say functional words (e.g., water, toilet) at 75% accuracy given frequent maximal phonemic placement cues in order to communicate ability to communicate basic wants and needs. MET  5.  The patient will follow simple body commands presented auditorily at 80% accuracy given frequent maximal visual cues. Goal status: INITIAL      LONG TERM GOALS: Target date: 01/26/2023   Updated: 09/12/2022  Updated: 10/17/2022  Updated: 11/03/2022 Pt will use multimodal means of  communication to express basic biographical information about himself with moderate assistance.  Baseline:  Goal status: INITIAL Goal status: ONGOING; progress made; MET Updated Goal (11/03/2022): Pt will use multimodal means of communication to express basic biographical information about himself, activities within his day and his family with minimal assistance.     2.  Pt will demonstrate > 90% speech intelligibility when reading orientation information.  Baseline:  Goal status: INITIAL Goal status: ONGOING; progress made  3. Pt will answer semi-complex yes/no questions with > 75% accuracy given minimal cues.   Baseline: 75% with simple yes/no questions  Goal status: INITIAL     ASSESSMENT:   CLINICAL IMPRESSION: Pt is a 57 year old right handed male who was seen today for a speech-language treatment d/t moderate transcortical sensory aphasia. Pt continues to make good progress and he has met several of his STG and LTGs. See treatment note for more details.   OBJECTIVE IMPAIRMENTS include expressive language, receptive language, apraxia, and reading comprehension and ability to produce written language . These impairments are limiting patient from return to work, managing medications, managing appointments, managing finances, household responsibilities, ADLs/IADLs, and effectively communicating at home and in community. Factors affecting potential to achieve goals and functional outcome are severity of impairments. Patient will benefit from skilled SLP services to address above impairments and improve overall function.   REHAB POTENTIAL: Excellent   PLAN: SLP FREQUENCY: 3x/week to 4xweek   SLP DURATION: 12 weeks   PLANNED INTERVENTIONS: Language facilitation, Cueing hierachy, Functional tasks, Multimodal communication approach, SLP instruction and feedback, Compensatory strategies, and Patient/family education       [name] B. Dreama Saa, M.S., CCC-SLP, Research officer, trade union Certified Brain Injury Specialist [facility]  [facility] Rehabilitation Services Office [phone] Ascom [phone] Fax [phone]

## 2022-11-09 ENCOUNTER — Ambulatory Visit: Payer: BC Managed Care – PPO | Admitting: Speech Pathology

## 2022-11-09 DIAGNOSIS — R4701 Aphasia: Secondary | ICD-10-CM

## 2022-11-09 DIAGNOSIS — I63512 Cerebral infarction due to unspecified occlusion or stenosis of left middle cerebral artery: Secondary | ICD-10-CM

## 2022-11-09 NOTE — Therapy (Signed)
OUTPATIENT SPEECH LANGUAGE PATHOLOGY  TREATMENT NOTE     Patient Name: [name] MRN: [id number] DOB:1965-10-11, 57 y.o., male Today's Date: 11/09/2022   PCP: Myrene Buddy, NP REFERRING PROVIDER: Mariam Dollar, PA    End of Session - 11/09/22 1141     Visit Number 52    Number of Visits 97    Date for SLP Re-Evaluation 01/26/23    Authorization Type [employer]    Progress Note Due on Visit 60    SLP Start Time 0800    SLP Stop Time  0845    SLP Time Calculation (min) 45 min    Activity Tolerance Patient tolerated treatment well              Past Medical History:  Diagnosis Date   Hypertension    LVH (left ventricular hypertrophy)    Myocardial infarction (HCC)    Vitamin D deficiency    Past Surgical History:  Procedure Laterality Date   ANTERIOR CRUCIATE LIGAMENT REPAIR     COLONOSCOPY WITH PROPOFOL N/A 04/11/2018   Procedure: COLONOSCOPY WITH PROPOFOL;  Surgeon: Toledo, Boykin Nearing, MD;  Location: [facility] ENDOSCOPY;  Service: Gastroenterology;  Laterality: N/A;   GASTRIC RESECTION     IR ANGIO INTRA EXTRACRAN SEL INTERNAL CAROTID UNI L MOD SED  08/15/2022   IR US GUIDE VASC ACCESS RIGHT  08/15/2022   LAPAROTOMY     LOOP RECORDER INSERTION N/A 08/18/2022   Procedure: LOOP RECORDER INSERTION;  Surgeon: Regan Lemming, MD;  Location: [facility] INVASIVE CV LAB;  Service: Cardiovascular;  Laterality: N/A;   RADIOLOGY WITH ANESTHESIA N/A 08/14/2022   Procedure: IR WITH ANESTHESIA;  Surgeon: Julieanne Cotton, MD;  Location: [facility] OR;  Service: Radiology;  Laterality: N/A;   TEE WITHOUT CARDIOVERSION N/A 08/18/2022   Procedure: TRANSESOPHAGEAL ECHOCARDIOGRAM;  Surgeon: [name], MD;  Location: [facility] INVASIVE CV LAB;  Service: Cardiovascular;  Laterality: N/A;   Patient Active Problem List   Diagnosis Date Noted   Essential hypertension, benign 08/18/2022   Hyperlipidemia 08/18/2022   DM (diabetes mellitus), type 2 (HCC) 08/18/2022   Obesity  (BMI 30-39.9) 08/18/2022   CAD (coronary artery disease) 08/18/2022   OSA (obstructive sleep apnea) 08/18/2022   Acute ischemic left MCA stroke (HCC) 08/18/2022   Acute ischemic right MCA stroke (HCC) 08/14/2022   Hypertensive urgency 04/27/2017   ONSET DATE: 08/14/2022; date of referral 08/23/2022    REFERRING DIAG: L24.401 (ICD-10-CM) - Cerebral infarction due to unspecified occlusion or stenosis of left middle cerebral artery   THERAPY DIAG:  Aphasia   Apraxia   Cerebral infarction due to unspecified occlusion or stenosis of left middle cerebral artery (HCC)   Rationale for Evaluation and Treatment Rehabilitation   SUBJECTIVE:      PERTINENT HISTORY:  [name] is 56 year old male who presented to the [facility] ED on 08/14/2022 noted by wife to be very weak and confused and halfway enter out of the bathtub at home. Symptoms were sudden in onset. Last known well 9 PM. Patient presented with global aphasia. Code stroke activated. CT head negative for hemorrhage, aspects of 9, mild hyperdensity left MCA could indicate thrombus. Tenecteplase administered. CTA showed left M1 occlusion and neurointerventional radiology contacted. Patient transferred to [facility] for thrombectomy. On route he was noted to have periorbital welts and uvular swelling. Was concern of possible angioedema secondary to tenecteplase. He was intubated for airway protection and underwent cerebral angiogram by Dr. Tommie Sams. Recanalization of  the left MCA vascular tree noted. Critical care medicine consulted for management and placed on low-dose Neo-Synephrine infusion. Extubated on 4/23. 2D echo with EF of approximately 60 to 65% with trivial MVR. LDL 96, hemoglobin A1c 6.3%. He was started on heparin subcutaneously for VTE prophylaxis.     DIAGNOSTIC FINDINGS:    CT Head - 08/14/2022 Mild hyperdensity of the left MCA   CT Head and Neck Angio - 08/14/2022 Proximal occlusion of the left MCA M1 segment  with poor collateralization throughout the MCA territory.   IR Angio - 08/15/2022 Interval recanalization of the left M1/MCA occlusion seen on prior CT angiogram likely as a result of TNK administration. Therefore, no intervention performed.   MRI 08/15/2022 Acute cortical infarcts along the left frontal operculum and left anterior insula with additional small area of acute cortical infarct within left parietal lobe. Other areas of faint cortical DWI hyperintensity throughout the left MCA territory, including the left putamen, may reflect additional areas of infarct or ischemia. 2. Loss of the left transverse sinus flow void is favored to reflect slow flow. If clinical suspicion for thrombosis, CT venography could be performed for further characterization.     PAIN:  Are you having pain? No   FALLS: Has patient fallen in last 6 months?  No   LIVING ENVIRONMENT: Lives with: lives with their family Lives in: House/apartment   PLOF:  Level of assistance: Independent with ADLs, Independent with IADLs Employment: Full-time employment     PATIENT GOALS    to be able to communicate again   SUBJECTIVE STATEMENT:  Pt brought in HEP, his wife states that he completed by himself Pt accompanied by: pt's wife   OBJECTIVE:   TODAY'S TREATMENT:  Skilled treatment session focused on pt's communication goals. SLP facilitated session by providing the following interventions:  Pt brought in completed homework. Targeted word finding/retrieval thru use of crossword puzzle targeting functional information with increased cognitive load.   SLP utilized [facility] 2 Unit 1: Attention and Concentration pp 46. Pt benefited from Min A to complete with use of Text to Speech to aid in spelling and > 95% speech intelligibility following verbal cue at the word level. Pt with improved reading comprehension as well as spontaneous verbal answer to clue  Of note, pt also spelled to dictation x 10  independently.      PATIENT EDUCATION: Education details: see above Person educated: Patient, pt's son Education method: Explanation Education comprehension: verbalized understanding and needs further education   HOME EXERCISE PROGRAM:  N/A   GOALS:   Goals reviewed with patient? Yes   SHORT TERM GOALS: Target date: 10 sessions   UPDATED GOALS: 09/12/2022 UPDATED GOALS: 10/17/2022 UPDATED GOALS: 11/03/2022 The patient will name common household objects at 80% accuracy given frequent maximum verbal and frequent maximum phonemic cues. Goal status: INITIAL Goal Status: MET for names of family members Goal Updated: With moderate assistance, pt will name common household objects at 80% accuracy. ONGOING; MET Goal Updated: (11/03/2022): With minimal assistance, pt will name common objects with > 90%.   2.  The patient will identify the correct word given 2 choices at 80% accuracy given frequent maximal visual cues in order to increase ability to comprehend simple instructions. Goal status: INITIAL Goal status: Ongoing; ONGOING; MET   3.  The patient will follow simple body commands presented auditorily at 80% accuracy given frequent maximal visual cues. Goal status: INITIAL Goal status: Ongoing; ONGOING; MET   4.  The patient will  say functional words (e.g., water, toilet) at 50% accuracy given frequent maximal phonemic placement cues in order to communicate ability to communicate basic wants and needs. Goal status: INITIAL Goal status: MET Goal updated: The patient will say functional words (e.g., water, toilet) at 75% accuracy given frequent maximal phonemic placement cues in order to communicate ability to communicate basic wants and needs. MET  5.  The patient will follow simple body commands presented auditorily at 80% accuracy given frequent maximal visual cues. Goal status: INITIAL      LONG TERM GOALS: Target date: 01/26/2023   Updated: 09/12/2022  Updated:  10/17/2022  Updated: 11/03/2022 Pt will use multimodal means of communication to express basic biographical information about himself with moderate assistance.  Baseline:  Goal status: INITIAL Goal status: ONGOING; progress made; MET Updated Goal (11/03/2022): Pt will use multimodal means of communication to express basic biographical information about himself, activities within his day and his family with minimal assistance.     2.  Pt will demonstrate > 90% speech intelligibility when reading orientation information.  Baseline:  Goal status: INITIAL Goal status: ONGOING; progress made  3. Pt will answer semi-complex yes/no questions with > 75% accuracy given minimal cues.   Baseline: 75% with simple yes/no questions  Goal status: INITIAL     ASSESSMENT:   CLINICAL IMPRESSION: Pt is a 57 year old right handed male who was seen today for a speech-language treatment d/t moderate transcortical sensory aphasia. He demonstrates improved spontaneous responses, appropriate answer to yes/no, spelling as well as spelling to diction. See treatment note for more details.   OBJECTIVE IMPAIRMENTS include expressive language, receptive language, apraxia, and reading comprehension and ability to produce written language . These impairments are limiting patient from return to work, managing medications, managing appointments, managing finances, household responsibilities, ADLs/IADLs, and effectively communicating at home and in community. Factors affecting potential to achieve goals and functional outcome are severity of impairments. Patient will benefit from skilled SLP services to address above impairments and improve overall function.   REHAB POTENTIAL: Excellent   PLAN: SLP FREQUENCY: 3x/week to 4xweek   SLP DURATION: 12 weeks   PLANNED INTERVENTIONS: Language facilitation, Cueing hierachy, Functional tasks, Multimodal communication approach, SLP instruction and feedback, Compensatory  strategies, and Patient/family education       Rochell Mabie B. Dreama Saa, M.S., CCC-SLP, Tree surgeon Certified Brain Injury Specialist [facility]  [facility] Rehabilitation Services Office [phone] Ascom [phone] Fax [phone]

## 2022-11-10 ENCOUNTER — Ambulatory Visit: Payer: BC Managed Care – PPO | Admitting: Speech Pathology

## 2022-11-10 DIAGNOSIS — R4701 Aphasia: Secondary | ICD-10-CM | POA: Diagnosis not present

## 2022-11-10 DIAGNOSIS — I63512 Cerebral infarction due to unspecified occlusion or stenosis of left middle cerebral artery: Secondary | ICD-10-CM

## 2022-11-10 DIAGNOSIS — R482 Apraxia: Secondary | ICD-10-CM

## 2022-11-10 NOTE — Therapy (Signed)
OUTPATIENT SPEECH LANGUAGE PATHOLOGY  TREATMENT NOTE     Patient Name: [name] MRN: [id number] DOB:11/21/1965, 57 y.o., male Today's Date: 11/10/2022   PCP: Myrene Buddy, NP REFERRING PROVIDER: Mariam Dollar, PA    End of Session - 11/10/22 0928     Visit Number 53    Number of Visits 97    Date for SLP Re-Evaluation 01/26/23    Authorization Type [employer]    Progress Note Due on Visit 60    SLP Start Time 0845    SLP Stop Time  0930    SLP Time Calculation (min) 45 min    Activity Tolerance Patient tolerated treatment well              Past Medical History:  Diagnosis Date   Hypertension    LVH (left ventricular hypertrophy)    Myocardial infarction (HCC)    Vitamin D deficiency    Past Surgical History:  Procedure Laterality Date   ANTERIOR CRUCIATE LIGAMENT REPAIR     COLONOSCOPY WITH PROPOFOL N/A 04/11/2018   Procedure: COLONOSCOPY WITH PROPOFOL;  Surgeon: Toledo, Boykin Nearing, MD;  Location: [facility] ENDOSCOPY;  Service: Gastroenterology;  Laterality: N/A;   GASTRIC RESECTION     IR ANGIO INTRA EXTRACRAN SEL INTERNAL CAROTID UNI L MOD SED  08/15/2022   IR US GUIDE VASC ACCESS RIGHT  08/15/2022   LAPAROTOMY     LOOP RECORDER INSERTION N/A 08/18/2022   Procedure: LOOP RECORDER INSERTION;  Surgeon: Regan Lemming, MD;  Location: [facility] INVASIVE CV LAB;  Service: Cardiovascular;  Laterality: N/A;   RADIOLOGY WITH ANESTHESIA N/A 08/14/2022   Procedure: IR WITH ANESTHESIA;  Surgeon: Julieanne Cotton, MD;  Location: [facility] OR;  Service: Radiology;  Laterality: N/A;   TEE WITHOUT CARDIOVERSION N/A 08/18/2022   Procedure: TRANSESOPHAGEAL ECHOCARDIOGRAM;  Surgeon: [name], MD;  Location[facility] INVASIVE CV LAB;  Service: Cardiovascular;  Laterality: N/A;   Patient Active Problem List   Diagnosis Date Noted   Essential hypertension, benign 08/18/2022   Hyperlipidemia 08/18/2022   DM (diabetes mellitus), type 2 (HCC) 08/18/2022   Obesity  (BMI 30-39.9) 08/18/2022   CAD (coronary artery disease) 08/18/2022   OSA (obstructive sleep apnea) 08/18/2022   Acute ischemic left MCA stroke (HCC) 08/18/2022   Acute ischemic right MCA stroke (HCC) 08/14/2022   Hypertensive urgency 04/27/2017   ONSET DATE: 08/14/2022; date of referral 08/23/2022    REFERRING DIAG: U27.253 (ICD-10-CM) - Cerebral infarction due to unspecified occlusion or stenosis of left middle cerebral artery   THERAPY DIAG:  Aphasia   Apraxia   Cerebral infarction due to unspecified occlusion or stenosis of left middle cerebral artery (HCC)   Rationale for Evaluation and Treatment Rehabilitation   SUBJECTIVE:      PERTINENT HISTORY:  [name] is 57 year old male who presented to the [facility] ED on 08/14/2022 noted by wife to be very weak and confused and halfway enter out of the bathtub at home. Symptoms were sudden in onset. Last known well 9 PM. Patient presented with global aphasia. Code stroke activated. CT head negative for hemorrhage, aspects of 9, mild hyperdensity left MCA could indicate thrombus. Tenecteplase administered. CTA showed left M1 occlusion and neurointerventional radiology contacted. Patient transferred to [facility] for thrombectomy. On route he was noted to have periorbital welts and uvular swelling. Was concern of possible angioedema secondary to tenecteplase. He was intubated for airway protection and underwent cerebral angiogram by Dr. Tommie Sams. Recanalization of  the left MCA vascular tree noted. Critical care medicine consulted for management and placed on low-dose Neo-Synephrine infusion. Extubated on 4/23. 2D echo with EF of approximately 60 to 65% with trivial MVR. LDL 96, hemoglobin A1c 6.3%. He was started on heparin subcutaneously for VTE prophylaxis.     DIAGNOSTIC FINDINGS:    CT Head - 08/14/2022 Mild hyperdensity of the left MCA   CT Head and Neck Angio - 08/14/2022 Proximal occlusion of the left MCA M1 segment  with poor collateralization throughout the MCA territory.   IR Angio - 08/15/2022 Interval recanalization of the left M1/MCA occlusion seen on prior CT angiogram likely as a result of TNK administration. Therefore, no intervention performed.   MRI 08/15/2022 Acute cortical infarcts along the left frontal operculum and left anterior insula with additional small area of acute cortical infarct within left parietal lobe. Other areas of faint cortical DWI hyperintensity throughout the left MCA territory, including the left putamen, may reflect additional areas of infarct or ischemia. 2. Loss of the left transverse sinus flow void is favored to reflect slow flow. If clinical suspicion for thrombosis, CT venography could be performed for further characterization.     PAIN:  Are you having pain? No   FALLS: Has patient fallen in last 6 months?  No   LIVING ENVIRONMENT: Lives with: lives with their family Lives in: House/apartment   PLOF:  Level of assistance: Independent with ADLs, Independent with IADLs Employment: Full-time employment     PATIENT GOALS    to be able to communicate again   SUBJECTIVE STATEMENT:  Pt brought in HEP, his wife states that he completed by himself Pt accompanied by: pt's wife   OBJECTIVE:   TODAY'S TREATMENT:  Skilled treatment session focused on pt's communication goals. SLP facilitated session by providing the following interventions:  SLP presented pt with reading primer level story containing carrier phrases. Pt struggled with perseverative verbalizations but pt with much improved awareness and correct productive following brief wait time. Education provided on cue level (most supportive vs least supportive cues).      PATIENT EDUCATION: Education details: see above Person educated: Patient, pt's son Education method: Explanation Education comprehension: verbalized understanding and needs further education   HOME EXERCISE PROGRAM:  N/A    GOALS:   Goals reviewed with patient? Yes   SHORT TERM GOALS: Target date: 10 sessions   UPDATED GOALS: 09/12/2022 UPDATED GOALS: 10/17/2022 UPDATED GOALS: 11/03/2022 The patient will name common household objects at 80% accuracy given frequent maximum verbal and frequent maximum phonemic cues. Goal status: INITIAL Goal Status: MET for names of family members Goal Updated: With moderate assistance, pt will name common household objects at 80% accuracy. ONGOING; MET Goal Updated: (11/03/2022): With minimal assistance, pt will name common objects with > 90%.   2.  The patient will identify the correct word given 2 choices at 80% accuracy given frequent maximal visual cues in order to increase ability to comprehend simple instructions. Goal status: INITIAL Goal status: Ongoing; ONGOING; MET   3.  The patient will follow simple body commands presented auditorily at 80% accuracy given frequent maximal visual cues. Goal status: INITIAL Goal status: Ongoing; ONGOING; MET   4.  The patient will say functional words (e.g., water, toilet) at 50% accuracy given frequent maximal phonemic placement cues in order to communicate ability to communicate basic wants and needs. Goal status: INITIAL Goal status: MET Goal updated: The patient will say functional words (e.g., water, toilet) at 75% accuracy  given frequent maximal phonemic placement cues in order to communicate ability to communicate basic wants and needs. MET  5.  The patient will follow simple body commands presented auditorily at 80% accuracy given frequent maximal visual cues. Goal status: INITIAL      LONG TERM GOALS: Target date: 01/26/2023   Updated: 09/12/2022  Updated: 10/17/2022  Updated: 11/03/2022 Pt will use multimodal means of communication to express basic biographical information about himself with moderate assistance.  Baseline:  Goal status: INITIAL Goal status: ONGOING; progress made; MET Updated Goal  (11/03/2022): Pt will use multimodal means of communication to express basic biographical information about himself, activities within his day and his family with minimal assistance.     2.  Pt will demonstrate > 90% speech intelligibility when reading orientation information.  Baseline:  Goal status: INITIAL Goal status: ONGOING; progress made  3. Pt will answer semi-complex yes/no questions with > 75% accuracy given minimal cues.   Baseline: 75% with simple yes/no questions  Goal status: INITIAL     ASSESSMENT:   CLINICAL IMPRESSION: Pt is a 57 year old right handed male who was seen today for a speech-language treatment d/t moderate transcortical sensory aphasia. He demonstrates improved spontaneous responses, appropriate answer to yes/no, spelling as well as spelling to diction. Today he presents with improved word finding given a wait time when using a carrier phrase. See treatment note for more details.   OBJECTIVE IMPAIRMENTS include expressive language, receptive language, apraxia, and reading comprehension and ability to produce written language . These impairments are limiting patient from return to work, managing medications, managing appointments, managing finances, household responsibilities, ADLs/IADLs, and effectively communicating at home and in community. Factors affecting potential to achieve goals and functional outcome are severity of impairments. Patient will benefit from skilled SLP services to address above impairments and improve overall function.   REHAB POTENTIAL: Excellent   PLAN: SLP FREQUENCY: 3x/week to 4xweek   SLP DURATION: 12 weeks   PLANNED INTERVENTIONS: Language facilitation, Cueing hierachy, Functional tasks, Multimodal communication approach, SLP instruction and feedback, Compensatory strategies, and Patient/family education       Monserratt Knezevic B. Dreama Saa, M.S., CCC-SLP, Tree surgeon Certified Brain Injury Specialist [facility]   [facility] Rehabilitation Services Office [phone] Ascom [phone] Fax [phone]

## 2022-11-14 ENCOUNTER — Telehealth: Payer: Self-pay

## 2022-11-14 ENCOUNTER — Ambulatory Visit: Payer: BC Managed Care – PPO | Admitting: Speech Pathology

## 2022-11-14 DIAGNOSIS — I63512 Cerebral infarction due to unspecified occlusion or stenosis of left middle cerebral artery: Secondary | ICD-10-CM

## 2022-11-14 DIAGNOSIS — R4701 Aphasia: Secondary | ICD-10-CM

## 2022-11-14 NOTE — Telephone Encounter (Signed)
Called pt to schedule slp consult. Spoke with wife, Joni Reining, said she would call back to schedule once she had his schedule in front of her. Pt wife did ask about disability papers and where to email them for Dr. Pearlean Brownie, let her know I would send a message to his pod and someone would contact her back.

## 2022-11-14 NOTE — Therapy (Signed)
OUTPATIENT SPEECH LANGUAGE PATHOLOGY  TREATMENT NOTE     Patient Name: [name] MRN: [id number] DOB:[birth year], 57 y.o., male Today's Date: 11/14/2022   PCP: Myrene Buddy, NP REFERRING PROVIDER: Mariam Dollar, PA    End of Session - 11/14/22 0924     Visit Number 54    Number of Visits 97    Date for SLP Re-Evaluation 01/26/23    Authorization Type [employer]    Progress Note Due on Visit 60    SLP Start Time 0845    SLP Stop Time  0930    SLP Time Calculation (min) 45 min    Activity Tolerance Patient tolerated treatment well              Past Medical History:  Diagnosis Date   Hypertension    LVH (left ventricular hypertrophy)    Myocardial infarction (HCC)    Vitamin D deficiency    Past Surgical History:  Procedure Laterality Date   ANTERIOR CRUCIATE LIGAMENT REPAIR     COLONOSCOPY WITH PROPOFOL N/A 04/11/2018   Procedure: COLONOSCOPY WITH PROPOFOL;  Surgeon: Toledo, Boykin Nearing, MD;  Location: [facility] ENDOSCOPY;  Service: Gastroenterology;  Laterality: N/A;   GASTRIC RESECTION     IR ANGIO INTRA EXTRACRAN SEL INTERNAL CAROTID UNI L MOD SED  08/15/2022   IR US GUIDE VASC ACCESS RIGHT  08/15/2022   LAPAROTOMY     LOOP RECORDER INSERTION N/A 08/18/2022   Procedure: LOOP RECORDER INSERTION;  Surgeon: Regan Lemming, MD;  [facility]: [facility] INVASIVE CV LAB;  Service: Cardiovascular;  Laterality: N/A;   RADIOLOGY WITH ANESTHESIA N/A 08/14/2022   Procedure: IR WITH ANESTHESIA;  Surgeon: Julieanne Cotton, MD;  Location: [facility] OR;  Service: Radiology;  Laterality: N/A;   TEE WITHOUT CARDIOVERSION N/A 08/18/2022   Procedure: TRANSESOPHAGEAL ECHOCARDIOGRAM;  Surgeon: [name], MD;  Location: [facility] INVASIVE CV LAB;  Service: Cardiovascular;  Laterality: N/A;   Patient Active Problem List   Diagnosis Date Noted   Essential hypertension, benign 08/18/2022   Hyperlipidemia 08/18/2022   DM (diabetes mellitus), type 2 (HCC) 08/18/2022   Obesity  (BMI 30-39.9) 08/18/2022   CAD (coronary artery disease) 08/18/2022   OSA (obstructive sleep apnea) 08/18/2022   Acute ischemic left MCA stroke (HCC) 08/18/2022   Acute ischemic right MCA stroke (HCC) 08/14/2022   Hypertensive urgency 04/27/2017   ONSET DATE: 08/14/2022; date of referral 08/23/2022    REFERRING DIAG: P32.951 (ICD-10-CM) - Cerebral infarction due to unspecified occlusion or stenosis of left middle cerebral artery   THERAPY DIAG:  Aphasia   Apraxia   Cerebral infarction due to unspecified occlusion or stenosis of left middle cerebral artery (HCC)   Rationale for Evaluation and Treatment Rehabilitation   SUBJECTIVE:      PERTINENT HISTORY:  [name] is 57 year old male who presented to the [facility] ED on 08/14/2022 noted by wife to be very weak and confused and halfway enter out of the bathtub at home. Symptoms were sudden in onset. Last known well 9 PM. Patient presented with global aphasia. Code stroke activated. CT head negative for hemorrhage, aspects of 9, mild hyperdensity left MCA could indicate thrombus. Tenecteplase administered. CTA showed left M1 occlusion and neurointerventional radiology contacted. Patient transferred to [facility] for thrombectomy. On route he was noted to have periorbital welts and uvular swelling. Was concern of possible angioedema secondary to tenecteplase. He was intubated for airway protection and underwent cerebral angiogram by Dr. Tommie Sams. Recanalization of  the left MCA vascular tree noted. Critical care medicine consulted for management and placed on low-dose Neo-Synephrine infusion. Extubated on 4/23. 2D echo with EF of approximately 60 to 65% with trivial MVR. LDL 96, hemoglobin A1c 6.3%. He was started on heparin subcutaneously for VTE prophylaxis.     DIAGNOSTIC FINDINGS:    CT Head - 08/14/2022 Mild hyperdensity of the left MCA   CT Head and Neck Angio - 08/14/2022 Proximal occlusion of the left MCA M1 segment  with poor collateralization throughout the MCA territory.   IR Angio - 08/15/2022 Interval recanalization of the left M1/MCA occlusion seen on prior CT angiogram likely as a result of TNK administration. Therefore, no intervention performed.   MRI 08/15/2022 Acute cortical infarcts along the left frontal operculum and left anterior insula with additional small area of acute cortical infarct within left parietal lobe. Other areas of faint cortical DWI hyperintensity throughout the left MCA territory, including the left putamen, may reflect additional areas of infarct or ischemia. 2. Loss of the left transverse sinus flow void is favored to reflect slow flow. If clinical suspicion for thrombosis, CT venography could be performed for further characterization.     PAIN:  Are you having pain? No   FALLS: Has patient fallen in last 6 months?  No   LIVING ENVIRONMENT: Lives with: lives with their family Lives in: House/apartment   PLOF:  Level of assistance: Independent with ADLs, Independent with IADLs Employment: Full-time employment     PATIENT GOALS    to be able to communicate again   SUBJECTIVE STATEMENT:  Pt brought in HEP, accompanied by his son today Pt accompanied by: pt's son, [name]   OBJECTIVE:   TODAY'S TREATMENT:  Skilled treatment session focused on pt's communication goals. SLP facilitated session by providing the following interventions:  SLP presented pt with reading primer level story containing carrier phrases. Pt struggled with perseverative verbalizations but with moderate cues to wait before speaking as well as articulatory placement, pt able to decrease perseveration and improve word finding. He also brought in completed homework. Pt had 3 answers left incomplete but great job answering the remaining 33 accurately (using written words). When discussing the recent changes to the political environment, pt typed 3 words phrases x3 into TEXT to SPEECH app.  Improved word finding today.   In addition during general conversation, pt used Text to Speech app to answer questions with moderate repetitive support for receptive language.     PATIENT EDUCATION: Education details: see above Person educated: Patient, pt's son Education method: Explanation Education comprehension: verbalized understanding and needs further education   HOME EXERCISE PROGRAM:  N/A   GOALS:   Goals reviewed with patient? Yes   SHORT TERM GOALS: Target date: 10 sessions   UPDATED GOALS: 09/12/2022 UPDATED GOALS: 10/17/2022 UPDATED GOALS: 11/03/2022 The patient will name common household objects at 80% accuracy given frequent maximum verbal and frequent maximum phonemic cues. Goal status: INITIAL Goal Status: MET for names of family members Goal Updated: With moderate assistance, pt will name common household objects at 80% accuracy. ONGOING; MET Goal Updated: (11/03/2022): With minimal assistance, pt will name common objects with > 90%.   2.  The patient will identify the correct word given 2 choices at 80% accuracy given frequent maximal visual cues in order to increase ability to comprehend simple instructions. Goal status: INITIAL Goal status: Ongoing; ONGOING; MET   3.  The patient will follow simple body commands presented auditorily at 80% accuracy given  frequent maximal visual cues. Goal status: INITIAL Goal status: Ongoing; ONGOING; MET   4.  The patient will say functional words (e.g., water, toilet) at 50% accuracy given frequent maximal phonemic placement cues in order to communicate ability to communicate basic wants and needs. Goal status: INITIAL Goal status: MET Goal updated: The patient will say functional words (e.g., water, toilet) at 75% accuracy given frequent maximal phonemic placement cues in order to communicate ability to communicate basic wants and needs. MET  5.  The patient will follow simple body commands presented auditorily at 80%  accuracy given frequent maximal visual cues. Goal status: INITIAL      LONG TERM GOALS: Target date: 01/26/2023   Updated: 09/12/2022  Updated: 10/17/2022  Updated: 11/03/2022 Pt will use multimodal means of communication to express basic biographical information about himself with moderate assistance.  Baseline:  Goal status: INITIAL Goal status: ONGOING; progress made; MET Updated Goal (11/03/2022): Pt will use multimodal means of communication to express basic biographical information about himself, activities within his day and his family with minimal assistance.     2.  Pt will demonstrate > 90% speech intelligibility when reading orientation information.  Baseline:  Goal status: INITIAL Goal status: ONGOING; progress made  3. Pt will answer semi-complex yes/no questions with > 75% accuracy given minimal cues.   Baseline: 75% with simple yes/no questions  Goal status: INITIAL     ASSESSMENT:   CLINICAL IMPRESSION: Pt is a 57 year old right handed male who was seen today for a speech-language treatment d/t moderate transcortical sensory aphasia. He demonstrates improved spontaneous responses, appropriate answer to yes/no, spelling as well as spelling to diction. Today he presents with improved word finding given a wait time when using a carrier phrase. See treatment note for more details.   OBJECTIVE IMPAIRMENTS include expressive language, receptive language, apraxia, and reading comprehension and ability to produce written language . These impairments are limiting patient from return to work, managing medications, managing appointments, managing finances, household responsibilities, ADLs/IADLs, and effectively communicating at home and in community. Factors affecting potential to achieve goals and functional outcome are severity of impairments. Patient will benefit from skilled SLP services to address above impairments and improve overall function.   REHAB POTENTIAL:  Excellent   PLAN: SLP FREQUENCY: 3x/week to 4xweek   SLP DURATION: 12 weeks   PLANNED INTERVENTIONS: Language facilitation, Cueing hierachy, Functional tasks, Multimodal communication approach, SLP instruction and feedback, Compensatory strategies, and Patient/family education       Dore Oquin B. Dreama Saa, M.S., CCC-SLP, Tree surgeon Certified Brain Injury Specialist [facility]  [facility] Rehabilitation Services Office [phone] Ascom [phone] Fax [phone]

## 2022-11-15 ENCOUNTER — Ambulatory Visit: Payer: BC Managed Care – PPO | Admitting: Speech Pathology

## 2022-11-15 DIAGNOSIS — I63512 Cerebral infarction due to unspecified occlusion or stenosis of left middle cerebral artery: Secondary | ICD-10-CM

## 2022-11-15 DIAGNOSIS — R4701 Aphasia: Secondary | ICD-10-CM | POA: Diagnosis not present

## 2022-11-15 NOTE — Progress Notes (Signed)
Carelink Summary Report / Loop Recorder 

## 2022-11-15 NOTE — Therapy (Signed)
OUTPATIENT SPEECH LANGUAGE PATHOLOGY  TREATMENT NOTE     Patient Name: [name] MRN: [id number] DOB:1966-01-19, 57 y.o., male Today's Date: 11/15/2022   PCP: Myrene Buddy, NP REFERRING PROVIDER: Mariam Dollar, PA    End of Session - 11/15/22 0758     Visit Number 55    Number of Visits 97    Date for SLP Re-Evaluation 01/26/23    Authorization Type [employer]    Progress Note Due on Visit 60    SLP Start Time 0800    SLP Stop Time  0845    SLP Time Calculation (min) 45 min    Activity Tolerance Patient tolerated treatment well              Past Medical History:  Diagnosis Date   Hypertension    LVH (left ventricular hypertrophy)    Myocardial infarction (HCC)    Vitamin D deficiency    Past Surgical History:  Procedure Laterality Date   ANTERIOR CRUCIATE LIGAMENT REPAIR     COLONOSCOPY WITH PROPOFOL N/A 04/11/2018   Procedure: COLONOSCOPY WITH PROPOFOL;  Surgeon: Toledo, Boykin Nearing, MD;  Location: [facility] ENDOSCOPY;  Service: Gastroenterology;  Laterality: N/A;   GASTRIC RESECTION     IR ANGIO INTRA EXTRACRAN SEL INTERNAL CAROTID UNI L MOD SED  08/15/2022   IR US GUIDE VASC ACCESS RIGHT  08/15/2022   LAPAROTOMY     LOOP RECORDER INSERTION N/A 08/18/2022   Procedure: LOOP RECORDER INSERTION;  Surgeon: Regan Lemming, MD;  Location: [facility] INVASIVE CV LAB;  Service: Cardiovascular;  Laterality: N/A;   RADIOLOGY WITH ANESTHESIA N/A 08/14/2022   Procedure: IR WITH ANESTHESIA;  Surgeon: Julieanne Cotton, MD;  Location: [facility] OR;  Service: Radiology;  Laterality: N/A;   TEE WITHOUT CARDIOVERSION N/A 08/18/2022   Procedure: TRANSESOPHAGEAL ECHOCARDIOGRAM;  Surgeon: [name], MD;  Location: [facility] INVASIVE CV LAB;  Service: Cardiovascular;  Laterality: N/A;   Patient Active Problem List   Diagnosis Date Noted   Essential hypertension, benign 08/18/2022   Hyperlipidemia 08/18/2022   DM (diabetes mellitus), type 2 (HCC) 08/18/2022   Obesity  (BMI 30-39.9) 08/18/2022   CAD (coronary artery disease) 08/18/2022   OSA (obstructive sleep apnea) 08/18/2022   Acute ischemic left MCA stroke (HCC) 08/18/2022   Acute ischemic right MCA stroke (HCC) 08/14/2022   Hypertensive urgency 04/27/2017   ONSET DATE: 08/14/2022; date of referral 08/23/2022    REFERRING DIAG: N62.952 (ICD-10-CM) - Cerebral infarction due to unspecified occlusion or stenosis of left middle cerebral artery   THERAPY DIAG:  Aphasia   Apraxia   Cerebral infarction due to unspecified occlusion or stenosis of left middle cerebral artery (HCC)   Rationale for Evaluation and Treatment Rehabilitation   SUBJECTIVE:      PERTINENT HISTORY:  [name] is 57 year old male who presented to the [facility] ED on 08/14/2022 noted by wife to be very weak and confused and halfway enter out of the bathtub at home. Symptoms were sudden in onset. Last known well 9 PM. Patient presented with global aphasia. Code stroke activated. CT head negative for hemorrhage, aspects of 9, mild hyperdensity left MCA could indicate thrombus. Tenecteplase administered. CTA showed left M1 occlusion and neurointerventional radiology contacted. Patient transferred to [facility] for thrombectomy. On route he was noted to have periorbital welts and uvular swelling. Was concern of possible angioedema secondary to tenecteplase. He was intubated for airway protection and underwent cerebral angiogram by Dr. Tommie Sams. Recanalization of  the left MCA vascular tree noted. Critical care medicine consulted for management and placed on low-dose Neo-Synephrine infusion. Extubated on 4/23. 2D echo with EF of approximately 60 to 65% with trivial MVR. LDL 96, hemoglobin A1c 6.3%. He was started on heparin subcutaneously for VTE prophylaxis.     DIAGNOSTIC FINDINGS:    CT Head - 08/14/2022 Mild hyperdensity of the left MCA   CT Head and Neck Angio - 08/14/2022 Proximal occlusion of the left MCA M1 segment  with poor collateralization throughout the MCA territory.   IR Angio - 08/15/2022 Interval recanalization of the left M1/MCA occlusion seen on prior CT angiogram likely as a result of TNK administration. Therefore, no intervention performed.   MRI 08/15/2022 Acute cortical infarcts along the left frontal operculum and left anterior insula with additional small area of acute cortical infarct within left parietal lobe. Other areas of faint cortical DWI hyperintensity throughout the left MCA territory, including the left putamen, may reflect additional areas of infarct or ischemia. 2. Loss of the left transverse sinus flow void is favored to reflect slow flow. If clinical suspicion for thrombosis, CT venography could be performed for further characterization.     PAIN:  Are you having pain? No   FALLS: Has patient fallen in last 6 months?  No   LIVING ENVIRONMENT: Lives with: lives with their family Lives in: House/apartment   PLOF:  Level of assistance: Independent with ADLs, Independent with IADLs Employment: Full-time employment     PATIENT GOALS    to be able to communicate again   SUBJECTIVE STATEMENT:  Pt eager to answer questions regarding recent political events Pt accompanied by: pt's wife   OBJECTIVE:   TODAY'S TREATMENT:  Skilled treatment session focused on pt's communication goals. SLP facilitated session by providing the following interventions:  During initial conversation, using TEXT to SPEECH app pt responded to questions about updates concerning current political news. In response pt typed 3 word answers with 100% speech intelligibility following verbal model.   SLP further facilitated session by verbal model of 3 words when reading questions taken from [facility] 8" Word Finding, Answering Questions pp9-10 - pt with improved speech intelligibility of ~ 90% following verbal model including multi syllabic words. Pt with improved spelling when copying basic words  of ~ 95% to aid in using the TEXT to SPEECH APP.     PATIENT EDUCATION: Education details: see above Person educated: Patient, pt's son Education method: Explanation Education comprehension: verbalized understanding and needs further education   HOME EXERCISE PROGRAM:  N/A   GOALS:   Goals reviewed with patient? Yes   SHORT TERM GOALS: Target date: 10 sessions   UPDATED GOALS: 09/12/2022 UPDATED GOALS: 10/17/2022 UPDATED GOALS: 11/03/2022 The patient will name common household objects at 80% accuracy given frequent maximum verbal and frequent maximum phonemic cues. Goal status: INITIAL Goal Status: MET for names of family members Goal Updated: With moderate assistance, pt will name common household objects at 80% accuracy. ONGOING; MET Goal Updated: (11/03/2022): With minimal assistance, pt will name common objects with > 90%.   2.  The patient will identify the correct word given 2 choices at 80% accuracy given frequent maximal visual cues in order to increase ability to comprehend simple instructions. Goal status: INITIAL Goal status: Ongoing; ONGOING; MET   3.  The patient will follow simple body commands presented auditorily at 80% accuracy given frequent maximal visual cues. Goal status: INITIAL Goal status: Ongoing; ONGOING; MET   4.  The  patient will say functional words (e.g., water, toilet) at 50% accuracy given frequent maximal phonemic placement cues in order to communicate ability to communicate basic wants and needs. Goal status: INITIAL Goal status: MET Goal updated: The patient will say functional words (e.g., water, toilet) at 75% accuracy given frequent maximal phonemic placement cues in order to communicate ability to communicate basic wants and needs. MET  5.  The patient will follow simple body commands presented auditorily at 80% accuracy given frequent maximal visual cues. Goal status: INITIAL      LONG TERM GOALS: Target date: 01/26/2023   Updated:  09/12/2022  Updated: 10/17/2022  Updated: 11/03/2022 Pt will use multimodal means of communication to express basic biographical information about himself with moderate assistance.  Baseline:  Goal status: INITIAL Goal status: ONGOING; progress made; MET Updated Goal (11/03/2022): Pt will use multimodal means of communication to express basic biographical information about himself, activities within his day and his family with minimal assistance.     2.  Pt will demonstrate > 90% speech intelligibility when reading orientation information.  Baseline:  Goal status: INITIAL Goal status: ONGOING; progress made  3. Pt will answer semi-complex yes/no questions with > 75% accuracy given minimal cues.   Baseline: 75% with simple yes/no questions  Goal status: INITIAL     ASSESSMENT:   CLINICAL IMPRESSION: Pt is a 57 year old right handed male who was seen today for a speech-language treatment d/t moderate transcortical sensory aphasia. He demonstrates improved spontaneous responses, appropriate answer to yes/no, spelling as well as spelling to diction. His speech intelligibility is much improved today following verbal model when using slower rate of production. See treatment note for more details.   OBJECTIVE IMPAIRMENTS include expressive language, receptive language, apraxia, and reading comprehension and ability to produce written language . These impairments are limiting patient from return to work, managing medications, managing appointments, managing finances, household responsibilities, ADLs/IADLs, and effectively communicating at home and in community. Factors affecting potential to achieve goals and functional outcome are severity of impairments. Patient will benefit from skilled SLP services to address above impairments and improve overall function.   REHAB POTENTIAL: Excellent   PLAN: SLP FREQUENCY: 3x/week to 4xweek   SLP DURATION: 12 weeks   PLANNED INTERVENTIONS: Language  facilitation, Cueing hierachy, Functional tasks, Multimodal communication approach, SLP instruction and feedback, Compensatory strategies, and Patient/family education       Gatlin Kittell B. Dreama Saa, M.S., CCC-SLP, Tree surgeon Certified Brain Injury Specialist [facility]  [facility] Rehabilitation Services Office [phone] Ascom [phone] Fax [phone]

## 2022-11-16 ENCOUNTER — Ambulatory Visit: Payer: BC Managed Care – PPO | Admitting: Speech Pathology

## 2022-11-16 DIAGNOSIS — I63512 Cerebral infarction due to unspecified occlusion or stenosis of left middle cerebral artery: Secondary | ICD-10-CM

## 2022-11-16 DIAGNOSIS — R4701 Aphasia: Secondary | ICD-10-CM | POA: Diagnosis not present

## 2022-11-16 DIAGNOSIS — R482 Apraxia: Secondary | ICD-10-CM

## 2022-11-16 NOTE — Therapy (Signed)
OUTPATIENT SPEECH LANGUAGE PATHOLOGY  TREATMENT NOTE     Patient Name: [name] MRN: [id number] DOB:03-30-1966, 57 y.o., male Today's Date: 11/16/2022   PCP: Jon Buddy, NP REFERRING PROVIDER: Mariam Dollar, PA    End of Session - 11/16/22 1405     Visit Number 56    Number of Visits 97    Date for SLP Re-Evaluation 01/26/23    Authorization Type [employer]    Progress Note Due on Visit 60    SLP Start Time 1315    SLP Stop Time  1400    SLP Time Calculation (min) 45 min    Activity Tolerance Patient tolerated treatment well              Past Medical History:  Diagnosis Date   Hypertension    LVH (left ventricular hypertrophy)    Myocardial infarction (HCC)    Vitamin D deficiency    Past Surgical History:  Procedure Laterality Date   ANTERIOR CRUCIATE LIGAMENT REPAIR     COLONOSCOPY WITH PROPOFOL N/A 04/11/2018   Procedure: COLONOSCOPY WITH PROPOFOL;  Surgeon: Toledo, Boykin Nearing, MD;  Location: [facility] ENDOSCOPY;  Service: Gastroenterology;  Laterality: N/A;   GASTRIC RESECTION     IR ANGIO INTRA EXTRACRAN SEL INTERNAL CAROTID UNI L MOD SED  08/15/2022   IR US GUIDE VASC ACCESS RIGHT  08/15/2022   LAPAROTOMY     LOOP RECORDER INSERTION N/A 08/18/2022   Procedure: LOOP RECORDER INSERTION;  Surgeon: Jon Lemming, MD;  [facility]: [facility];  Service: Cardiovascular;  Laterality: N/A;   RADIOLOGY WITH ANESTHESIA N/A 08/14/2022   Procedure: IR WITH ANESTHESIA;  Surgeon: Jon Cotton, MD;  Location: [facility] OR;  Service: Radiology;  Laterality: N/A;   TEE WITHOUT CARDIOVERSION N/A 08/18/2022   Procedure: TRANSESOPHAGEAL ECHOCARDIOGRAM;  Surgeon: Jon Fus, MD;  Location: [facility] INVASIVE CV LAB;  Service: Cardiovascular;  Laterality: N/A;   Patient Active Problem List   Diagnosis Date Noted   Essential hypertension, benign 08/18/2022   Hyperlipidemia 08/18/2022   DM (diabetes mellitus), type 2 (HCC) 08/18/2022   Obesity  (BMI 30-39.9) 08/18/2022   CAD (coronary artery disease) 08/18/2022   OSA (obstructive sleep apnea) 08/18/2022   Acute ischemic left MCA stroke (HCC) 08/18/2022   Acute ischemic right MCA stroke (HCC) 08/14/2022   Hypertensive urgency 04/27/2017   ONSET DATE: 08/14/2022; date of referral 08/23/2022    REFERRING DIAG: W09.811 (ICD-10-CM) - Cerebral infarction due to unspecified occlusion or stenosis of left middle cerebral artery   THERAPY DIAG:  Aphasia   Apraxia   Cerebral infarction due to unspecified occlusion or stenosis of left middle cerebral artery (HCC)   Rationale for Evaluation and Treatment Rehabilitation   SUBJECTIVE:      PERTINENT HISTORY:  [name] is 57 year old male who presented to the [facility] ED on 08/14/2022 noted by wife to be very weak and confused and halfway enter out of the bathtub at home. Symptoms were sudden in onset. Last known well 9 PM. Patient presented with global aphasia. Code stroke activated. CT head negative for hemorrhage, aspects of 9, mild hyperdensity left MCA could indicate thrombus. Tenecteplase administered. CTA showed left M1 occlusion and neurointerventional radiology contacted. Patient transferred to [facility] for thrombectomy. On route he was noted to have periorbital welts and uvular swelling. Was concern of possible angioedema secondary to tenecteplase. He was intubated for airway protection and underwent cerebral angiogram by [name]. Recanalization of  the left MCA vascular tree noted. Critical care medicine consulted for management and placed on low-dose Neo-Synephrine infusion. Extubated on 4/23. 2D echo with EF of approximately 60 to 65% with trivial MVR. LDL 96, hemoglobin A1c 6.3%. He was started on heparin subcutaneously for VTE prophylaxis.     DIAGNOSTIC FINDINGS:    CT Head - 08/14/2022 Mild hyperdensity of the left MCA   CT Head and Neck Angio - 08/14/2022 Proximal occlusion of the left MCA M1 segment  with poor collateralization throughout the MCA territory.   IR Angio - 08/15/2022 Interval recanalization of the left M1/MCA occlusion seen on prior CT angiogram likely as a result of TNK administration. Therefore, [name] intervention performed.   MRI 08/15/2022 Acute cortical infarcts along the left frontal operculum and left anterior insula with additional small area of acute cortical infarct within left parietal lobe. Other areas of faint cortical DWI hyperintensity throughout the left MCA territory, including the left putamen, may reflect additional areas of infarct or ischemia. 2. Loss of the left transverse sinus flow void is favored to reflect slow flow. If clinical suspicion for thrombosis, CT venography could be performed for further characterization.     PAIN:  Jon you having pain? [name]   FALLS: Has patient fallen in last 6 months?  [name]   LIVING ENVIRONMENT: Lives with: lives with their family Lives in: House/apartment   PLOF:  Level of assistance: Independent with ADLs, Independent with IADLs Employment: Full-time employment     PATIENT GOALS    to be able to communicate again   SUBJECTIVE STATEMENT:  [name] eager to answer questions regarding recent political events [name] accompanied by: [name]'s wife   OBJECTIVE:   TODAY'S TREATMENT:  Skilled treatment session focused on [name]'s communication goals. SLP facilitated session by providing the following interventions:  SLP engaged [name] in conversation regarding his family and vs household activities. With minimal cues, [name] able to verbally express statements such as "[name] do the dishes, [name] the backyard" [name] entered these phrases into text to speech app for further support but [name] able to communicate with improved independence.   In addition, when attempting to express an unfamiliar comment to his wife and [name], [name] spoke a 7 word cogent sentence - AMAZING PROGRESS - [name] largely unaware until seeing our reaction.    PATIENT  EDUCATION: Education details: see above Person educated: Patient, [name]'s son Education method: Explanation Education comprehension: verbalized understanding and needs further education   HOME EXERCISE PROGRAM:  N/A   GOALS:   Goals reviewed with patient? Yes   SHORT TERM GOALS: Target date: 10 sessions   UPDATED GOALS: 09/12/2022 UPDATED GOALS: 10/17/2022 UPDATED GOALS: 11/03/2022 The patient will name common household objects at 80% accuracy given frequent maximum verbal and frequent maximum phonemic cues. Goal status: INITIAL Goal Status: MET for names of family members Goal Updated: With moderate assistance, [name] will name common household objects at 80% accuracy. ONGOING; MET Goal Updated: (11/03/2022): With minimal assistance, [name] will name common objects with > 90%.   2.  The patient will identify the correct word given 2 choices at 80% accuracy given frequent maximal visual cues in order to increase ability to comprehend simple instructions. Goal status: INITIAL Goal status: Ongoing; ONGOING; MET   3.  The patient will follow simple body commands presented auditorily at 80% accuracy given frequent maximal visual cues. Goal status: INITIAL Goal status: Ongoing; ONGOING; MET   4.  The patient will say functional words (e.g., water, toilet)  at 50% accuracy given frequent maximal phonemic placement cues in order to communicate ability to communicate basic wants and needs. Goal status: INITIAL Goal status: MET Goal updated: The patient will say functional words (e.g., water, toilet) at 75% accuracy given frequent maximal phonemic placement cues in order to communicate ability to communicate basic wants and needs. MET  5.  The patient will follow simple body commands presented auditorily at 80% accuracy given frequent maximal visual cues. Goal status: INITIAL      LONG TERM GOALS: Target date: 01/26/2023   Updated: 09/12/2022  Updated: 10/17/2022  Updated: 11/03/2022 [name] will  use multimodal means of communication to express basic biographical information about himself with moderate assistance.  Baseline:  Goal status: INITIAL Goal status: ONGOING; progress made; MET Updated Goal (11/03/2022): [name] will use multimodal means of communication to express basic biographical information about himself, activities within his day and his family with minimal assistance.     2.  [name] will demonstrate > 90% speech intelligibility when reading orientation information.  Baseline:  Goal status: INITIAL Goal status: ONGOING; progress made  3. [name] will answer semi-complex yes/[name] questions with > 75% accuracy given minimal cues.   Baseline: 75% with simple yes/[name] questions  Goal status: INITIAL     ASSESSMENT:   CLINICAL IMPRESSION: [name] is a 57 year old right handed male who was seen today for a speech-language treatment d/t moderate transcortical sensory aphasia. [name] with hugely increased ability to produce cogent sentences. See treatment note for more details.   OBJECTIVE IMPAIRMENTS include expressive language, receptive language, apraxia, and reading comprehension and ability to produce written language . These impairments Jon limiting patient from return to work, managing medications, managing appointments, managing finances, household responsibilities, ADLs/IADLs, and effectively communicating at home and in community. Factors affecting potential to achieve goals and functional outcome Jon severity of impairments. Patient will benefit from skilled SLP services to address above impairments and improve overall function.   REHAB POTENTIAL: Excellent   PLAN: SLP FREQUENCY: 3x/week to 4xweek   SLP DURATION: 12 weeks   PLANNED INTERVENTIONS: Language facilitation, Cueing hierachy, Functional tasks, Multimodal communication approach, SLP instruction and feedback, Compensatory strategies, and Patient/family education       Marilyn Nihiser B. Dreama Saa, M.S., CCC-SLP, Research officer, trade union Certified Brain Injury Specialist [facility]  [facility] Rehabilitation Services Office [phone] Ascom [phone] Fax [phone]

## 2022-11-17 ENCOUNTER — Ambulatory Visit: Payer: BC Managed Care – PPO | Admitting: Speech Pathology

## 2022-11-17 DIAGNOSIS — R4701 Aphasia: Secondary | ICD-10-CM

## 2022-11-17 DIAGNOSIS — I63512 Cerebral infarction due to unspecified occlusion or stenosis of left middle cerebral artery: Secondary | ICD-10-CM

## 2022-11-18 NOTE — Therapy (Signed)
OUTPATIENT SPEECH LANGUAGE PATHOLOGY  TREATMENT NOTE     Patient Name: [name] MRN: [id number] DOB:10/26/1965, 57 y.o., male Today's Date: 11/17/2022   PCP: Myrene Buddy, NP REFERRING PROVIDER: Mariam Dollar, PA    End of Session - 11/17/22 0827     Visit Number 57    Number of Visits 97    Date for SLP Re-Evaluation 01/26/23    Authorization Type [employer]    Progress Note Due on Visit 60    SLP Start Time 0745    SLP Stop Time  0830    SLP Time Calculation (min) 45 min    Activity Tolerance Patient tolerated treatment well              Past Medical History:  Diagnosis Date   Hypertension    LVH (left ventricular hypertrophy)    Myocardial infarction (HCC)    Vitamin D deficiency    Past Surgical History:  Procedure Laterality Date   ANTERIOR CRUCIATE LIGAMENT REPAIR     COLONOSCOPY WITH PROPOFOL N/A 04/11/2018   Procedure: COLONOSCOPY WITH PROPOFOL;  Surgeon: Toledo, Boykin Nearing, MD;  Location: [facility] ENDOSCOPY;  Service: Gastroenterology;  Laterality: N/A;   GASTRIC RESECTION     IR ANGIO INTRA EXTRACRAN SEL INTERNAL CAROTID UNI L MOD SED  08/15/2022   IR US GUIDE VASC ACCESS RIGHT  08/15/2022   LAPAROTOMY     LOOP RECORDER INSERTION N/A 08/18/2022   Procedure: LOOP RECORDER INSERTION;  Surgeon: Regan Lemming, MD;  Location: [facility] INVASIVE CV LAB;  Service: Cardiovascular;  Laterality: N/A;   RADIOLOGY WITH ANESTHESIA N/A 08/14/2022   Procedure: IR WITH ANESTHESIA;  Surgeon: Julieanne Cotton, MD;  Location: [facility] OR;  Service: Radiology;  Laterality: N/A;   TEE WITHOUT CARDIOVERSION N/A 08/18/2022   Procedure: TRANSESOPHAGEAL ECHOCARDIOGRAM;  Surgeon: [name], MD;  Location: [facility] INVASIVE CV LAB;  Service: Cardiovascular;  Laterality: N/A;   Patient Active Problem List   Diagnosis Date Noted   Essential hypertension, benign 08/18/2022   Hyperlipidemia 08/18/2022   DM (diabetes mellitus), type 2 (HCC) 08/18/2022   Obesity  (BMI 30-39.9) 08/18/2022   CAD (coronary artery disease) 08/18/2022   OSA (obstructive sleep apnea) 08/18/2022   Acute ischemic left MCA stroke (HCC) 08/18/2022   Acute ischemic right MCA stroke (HCC) 08/14/2022   Hypertensive urgency 04/27/2017   ONSET DATE: 08/14/2022; date of referral 08/23/2022    REFERRING DIAG: Q65.784 (ICD-10-CM) - Cerebral infarction due to unspecified occlusion or stenosis of left middle cerebral artery   THERAPY DIAG:  Aphasia   Apraxia   Cerebral infarction due to unspecified occlusion or stenosis of left middle cerebral artery (HCC)   Rationale for Evaluation and Treatment Rehabilitation   SUBJECTIVE:      PERTINENT HISTORY:  [name] is 57 year old male who presented to the [facility] ED on 08/14/2022 noted by wife to be very weak and confused and halfway enter out of the bathtub at home. Symptoms were sudden in onset. Last known well 9 PM. Patient presented with global aphasia. Code stroke activated. CT head negative for hemorrhage, aspects of 9, mild hyperdensity left MCA could indicate thrombus. Tenecteplase administered. CTA showed left M1 occlusion and neurointerventional radiology contacted. Patient transferred to [facility] for thrombectomy. On route he was noted to have periorbital welts and uvular swelling. Was concern of possible angioedema secondary to tenecteplase. He was intubated for airway protection and underwent cerebral angiogram by Dr. Tommie Sams. Recanalization of  the left MCA vascular tree noted. Critical care medicine consulted for management and placed on low-dose Neo-Synephrine infusion. Extubated on 4/23. 2D echo with EF of approximately 60 to 65% with trivial MVR. LDL 96, hemoglobin A1c 6.3%. He was started on heparin subcutaneously for VTE prophylaxis.     DIAGNOSTIC FINDINGS:    CT Head - 08/14/2022 Mild hyperdensity of the left MCA   CT Head and Neck Angio - 08/14/2022 Proximal occlusion of the left MCA M1 segment  with poor collateralization throughout the MCA territory.   IR Angio - 08/15/2022 Interval recanalization of the left M1/MCA occlusion seen on prior CT angiogram likely as a result of TNK administration. Therefore, no intervention performed.   MRI 08/15/2022 Acute cortical infarcts along the left frontal operculum and left anterior insula with additional small area of acute cortical infarct within left parietal lobe. Other areas of faint cortical DWI hyperintensity throughout the left MCA territory, including the left putamen, may reflect additional areas of infarct or ischemia. 2. Loss of the left transverse sinus flow void is favored to reflect slow flow. If clinical suspicion for thrombosis, CT venography could be performed for further characterization.     PAIN:  Are you having pain? No   FALLS: Has patient fallen in last 6 months?  No   LIVING ENVIRONMENT: Lives with: lives with their family Lives in: House/apartment   PLOF:  Level of assistance: Independent with ADLs, Independent with IADLs Employment: Full-time employment     PATIENT GOALS    to be able to communicate again   SUBJECTIVE STATEMENT:  Pt eager to answer questions regarding recent political events Pt accompanied by: pt's wife   OBJECTIVE:   TODAY'S TREATMENT:  Skilled treatment session focused on pt's communication goals. SLP facilitated session by providing the following interventions:  SLP engaged pt in higher level word finding thru use of [employer] 8: Word Finding Building Categorization Skills p35 - Pt benefited from Moderate to occasional Min A to complete with 85% accuracy.    PATIENT EDUCATION: Education details: see above Person educated: Patient, pt's son Education method: Explanation Education comprehension: verbalized understanding and needs further education   HOME EXERCISE PROGRAM:  N/A   GOALS:   Goals reviewed with patient? Yes   SHORT TERM GOALS: Target date: 10 sessions    UPDATED GOALS: 09/12/2022 UPDATED GOALS: 10/17/2022 UPDATED GOALS: 11/03/2022 The patient will name common household objects at 80% accuracy given frequent maximum verbal and frequent maximum phonemic cues. Goal status: INITIAL Goal Status: MET for names of family members Goal Updated: With moderate assistance, pt will name common household objects at 80% accuracy. ONGOING; MET Goal Updated: (11/03/2022): With minimal assistance, pt will name common objects with > 90%.   2.  The patient will identify the correct word given 2 choices at 80% accuracy given frequent maximal visual cues in order to increase ability to comprehend simple instructions. Goal status: INITIAL Goal status: Ongoing; ONGOING; MET   3.  The patient will follow simple body commands presented auditorily at 80% accuracy given frequent maximal visual cues. Goal status: INITIAL Goal status: Ongoing; ONGOING; MET   4.  The patient will say functional words (e.g., water, toilet) at 50% accuracy given frequent maximal phonemic placement cues in order to communicate ability to communicate basic wants and needs. Goal status: INITIAL Goal status: MET Goal updated: The patient will say functional words (e.g., water, toilet) at 75% accuracy given frequent maximal phonemic placement cues in order to communicate ability to  communicate basic wants and needs. MET  5.  The patient will follow simple body commands presented auditorily at 80% accuracy given frequent maximal visual cues. Goal status: INITIAL      LONG TERM GOALS: Target date: 01/26/2023   Updated: 09/12/2022  Updated: 10/17/2022  Updated: 11/03/2022 Pt will use multimodal means of communication to express basic biographical information about himself with moderate assistance.  Baseline:  Goal status: INITIAL Goal status: ONGOING; progress made; MET Updated Goal (11/03/2022): Pt will use multimodal means of communication to express basic biographical information about  himself, activities within his day and his family with minimal assistance.     2.  Pt will demonstrate > 90% speech intelligibility when reading orientation information.  Baseline:  Goal status: INITIAL Goal status: ONGOING; progress made  3. Pt will answer semi-complex yes/no questions with > 75% accuracy given minimal cues.   Baseline: 75% with simple yes/no questions  Goal status: INITIAL     ASSESSMENT:   CLINICAL IMPRESSION: Pt is a 57 year old right handed male who was seen today for a speech-language treatment d/t moderate transcortical sensory aphasia. Pt with hugely increased ability to produce cogent sentences. See treatment note for more details.   OBJECTIVE IMPAIRMENTS include expressive language, receptive language, apraxia, and reading comprehension and ability to produce written language . These impairments are limiting patient from return to work, managing medications, managing appointments, managing finances, household responsibilities, ADLs/IADLs, and effectively communicating at home and in community. Factors affecting potential to achieve goals and functional outcome are severity of impairments. Patient will benefit from skilled SLP services to address above impairments and improve overall function.   REHAB POTENTIAL: Excellent   PLAN: SLP FREQUENCY: 3x/week to 4xweek   SLP DURATION: 12 weeks   PLANNED INTERVENTIONS: Language facilitation, Cueing hierachy, Functional tasks, Multimodal communication approach, SLP instruction and feedback, Compensatory strategies, and Patient/family education       Menelik Mcfarren B. Dreama Saa, M.S., CCC-SLP, Tree surgeon Certified Brain Injury Specialist [facility]  [facility] Rehabilitation Services Office [phone] Ascom [phone] Fax [phone]

## 2022-11-21 ENCOUNTER — Ambulatory Visit: Payer: BC Managed Care – PPO | Admitting: Speech Pathology

## 2022-11-21 DIAGNOSIS — R4701 Aphasia: Secondary | ICD-10-CM

## 2022-11-21 DIAGNOSIS — I63512 Cerebral infarction due to unspecified occlusion or stenosis of left middle cerebral artery: Secondary | ICD-10-CM

## 2022-11-21 NOTE — Therapy (Signed)
OUTPATIENT SPEECH LANGUAGE PATHOLOGY  TREATMENT NOTE     Patient Name: [name] MRN: [id number] DOB:May 07, 1965, 57 y.o., male Today's Date: 11/21/2022   PCP: Jon Buddy, NP REFERRING PROVIDER: Mariam Dollar, PA   End of Session - 11/21/22 1445     Visit Number 58    Number of Visits 97    Date for SLP Re-Evaluation 01/26/23    Authorization Type [employer]    Progress Note Due on Visit 60    SLP Start Time [id number]    SLP Stop Time  0930    SLP Time Calculation (min) 32 min    Activity Tolerance Patient tolerated treatment well              Past Medical History:  Diagnosis Date   Hypertension    LVH (left ventricular hypertrophy)    Myocardial infarction (HCC)    Vitamin D deficiency    Past Surgical History:  Procedure Laterality Date   ANTERIOR CRUCIATE LIGAMENT REPAIR     COLONOSCOPY WITH PROPOFOL N/A 04/11/2018   Procedure: COLONOSCOPY WITH PROPOFOL;  Surgeon: [name], Boykin Nearing, MD;  Location: [facility] ENDOSCOPY;  Service: Gastroenterology;  Laterality: N/A;   GASTRIC RESECTION     IR ANGIO INTRA EXTRACRAN SEL INTERNAL CAROTID UNI L MOD SED  08/15/2022   IR US GUIDE VASC ACCESS RIGHT  08/15/2022   LAPAROTOMY     LOOP RECORDER INSERTION N/A 08/18/2022   Procedure: LOOP RECORDER INSERTION;  Surgeon: Regan Lemming, MD;  Location: [facility] INVASIVE CV LAB;  Service: Cardiovascular;  Laterality: N/A;   RADIOLOGY WITH ANESTHESIA N/A 08/14/2022   Procedure: IR WITH ANESTHESIA;  Surgeon: Julieanne Cotton, MD;  Location: [facility] OR;  Service: Radiology;  Laterality: N/A;   TEE WITHOUT CARDIOVERSION N/A 08/18/2022   Procedure: TRANSESOPHAGEAL ECHOCARDIOGRAM;  Surgeon: [name], MD;  Location: [facility] INVASIVE CV LAB;  Service: Cardiovascular;  Laterality: N/A;   Patient Active Problem List   Diagnosis Date Noted   Essential hypertension, benign 08/18/2022   Hyperlipidemia 08/18/2022   DM (diabetes mellitus), type 2 (HCC) 08/18/2022   Obesity  (BMI 30-39.9) 08/18/2022   CAD (coronary artery disease) 08/18/2022   OSA (obstructive sleep apnea) 08/18/2022   Acute ischemic left MCA stroke (HCC) 08/18/2022   Acute ischemic right MCA stroke (HCC) 08/14/2022   Hypertensive urgency 04/27/2017   ONSET DATE: 08/14/2022; date of referral 08/23/2022    REFERRING DIAG: B15.176 (ICD-10-CM) - Cerebral infarction due to unspecified occlusion or stenosis of left middle cerebral artery   THERAPY DIAG:  Aphasia   Apraxia   Cerebral infarction due to unspecified occlusion or stenosis of left middle cerebral artery (HCC)   Rationale for Evaluation and Treatment Rehabilitation   SUBJECTIVE:      PERTINENT HISTORY:  [name] is 57 year old male who presented to the [facility] ED on 08/14/2022 noted by wife to be very weak and confused and halfway enter out of the bathtub at home. Symptoms were sudden in onset. Last known well 9 PM. Patient presented with global aphasia. Code stroke activated. CT head negative for hemorrhage, aspects of 9, mild hyperdensity left MCA could indicate thrombus. Tenecteplase administered. CTA showed left M1 occlusion and neurointerventional radiology contacted. Patient transferred to [facility] for thrombectomy. On route he was noted to have periorbital welts and uvular swelling. Was concern of possible angioedema secondary to tenecteplase. He was intubated for airway protection and underwent cerebral angiogram by Dr. Tommie Sams. Recanalization of the  left MCA vascular tree noted. Critical care medicine consulted for management and placed on low-dose Neo-Synephrine infusion. Extubated on 4/23. 2D echo with EF of approximately 60 to 65% with trivial MVR. LDL 96, hemoglobin A1c 6.3%. He was started on heparin subcutaneously for VTE prophylaxis.     DIAGNOSTIC FINDINGS:    CT Head - 08/14/2022 Mild hyperdensity of the left MCA   CT Head and Neck Angio - 08/14/2022 Proximal occlusion of the left MCA M1 segment  with poor collateralization throughout the MCA territory.   IR Angio - 08/15/2022 Interval recanalization of the left M1/MCA occlusion seen on prior CT angiogram likely as a result of TNK administration. Therefore, no intervention performed.   MRI 08/15/2022 Acute cortical infarcts along the left frontal operculum and left anterior insula with additional small area of acute cortical infarct within left parietal lobe. Other areas of faint cortical DWI hyperintensity throughout the left MCA territory, including the left putamen, may reflect additional areas of infarct or ischemia. 2. Loss of the left transverse sinus flow void is favored to reflect slow flow. If clinical suspicion for thrombosis, CT venography could be performed for further characterization.     PAIN:  Are you having pain? No   FALLS: Has patient fallen in last 6 months?  No   LIVING ENVIRONMENT: Lives with: lives with their family Lives in: House/apartment   PLOF:  Level of assistance: Independent with ADLs, Independent with IADLs Employment: Full-time employment     PATIENT GOALS    to be able to communicate again   SUBJECTIVE STATEMENT:  Session started later than anticipated d/t error in arriving pt for session Pt accompanied by: pt's son, [name]   OBJECTIVE:   TODAY'S TREATMENT:  Skilled treatment session focused on pt's communication goals. SLP facilitated session by providing the following interventions:  SLP engaged pt in higher level word finding thru use of WALC 8: Word Finding Building Categorization Skills p35 - Pt benefited from Moderate to occasional Min A to complete with 90% accuracy.   SLP also facilitated session by engaging pt in creation of functional sentences to instruct his son, [name], for household tasks - with minimal A pt able to verbalize "[name] make your bed, unload the dishwasher"   PATIENT EDUCATION: Education details: see above Person educated: Patient, pt's son Education  method: Explanation Education comprehension: verbalized understanding and needs further education   HOME EXERCISE PROGRAM:  N/A   GOALS:   Goals reviewed with patient? Yes   SHORT TERM GOALS: Target date: 10 sessions   UPDATED GOALS: 09/12/2022 UPDATED GOALS: 10/17/2022 UPDATED GOALS: 11/03/2022 The patient will name common household objects at 80% accuracy given frequent maximum verbal and frequent maximum phonemic cues. Goal status: INITIAL Goal Status: MET for names of family members Goal Updated: With moderate assistance, pt will name common household objects at 80% accuracy. ONGOING; MET Goal Updated: (11/03/2022): With minimal assistance, pt will name common objects with > 90%.   2.  The patient will identify the correct word given 2 choices at 80% accuracy given frequent maximal visual cues in order to increase ability to comprehend simple instructions. Goal status: INITIAL Goal status: Ongoing; ONGOING; MET   3.  The patient will follow simple body commands presented auditorily at 80% accuracy given frequent maximal visual cues. Goal status: INITIAL Goal status: Ongoing; ONGOING; MET   4.  The patient will say functional words (e.g., water, toilet) at 50% accuracy given frequent maximal phonemic placement cues in order to communicate  ability to communicate basic wants and needs. Goal status: INITIAL Goal status: MET Goal updated: The patient will say functional words (e.g., water, toilet) at 75% accuracy given frequent maximal phonemic placement cues in order to communicate ability to communicate basic wants and needs. MET  5.  The patient will follow simple body commands presented auditorily at 80% accuracy given frequent maximal visual cues. Goal status: INITIAL      LONG TERM GOALS: Target date: 01/26/2023   Updated: 09/12/2022  Updated: 10/17/2022  Updated: 11/03/2022 Pt will use multimodal means of communication to express basic biographical information about  himself with moderate assistance.  Baseline:  Goal status: INITIAL Goal status: ONGOING; progress made; MET Updated Goal (11/03/2022): Pt will use multimodal means of communication to express basic biographical information about himself, activities within his day and his family with minimal assistance.     2.  Pt will demonstrate > 90% speech intelligibility when reading orientation information.  Baseline:  Goal status: INITIAL Goal status: ONGOING; progress made  3. Pt will answer semi-complex yes/no questions with > 75% accuracy given minimal cues.   Baseline: 75% with simple yes/no questions  Goal status: INITIAL     ASSESSMENT:   CLINICAL IMPRESSION: Pt is a 57 year old right handed male who was seen today for a speech-language treatment d/t moderate transcortical sensory aphasia. Pt with improved ability to verbally produce functional sentence to instruct his younger on household tasks during today's session.  See treatment note for more details.   OBJECTIVE IMPAIRMENTS include expressive language, receptive language, apraxia, and reading comprehension and ability to produce written language . These impairments are limiting patient from return to work, managing medications, managing appointments, managing finances, household responsibilities, ADLs/IADLs, and effectively communicating at home and in community. Factors affecting potential to achieve goals and functional outcome are severity of impairments. Patient will benefit from skilled SLP services to address above impairments and improve overall function.   REHAB POTENTIAL: Excellent   PLAN: SLP FREQUENCY: 3x/week to 4xweek   SLP DURATION: 12 weeks   PLANNED INTERVENTIONS: Language facilitation, Cueing hierachy, Functional tasks, Multimodal communication approach, SLP instruction and feedback, Compensatory strategies, and Patient/family education       Zeferino Mounts B. Dreama Saa, M.S., CCC-SLP, Research officer, trade union Certified Brain Injury Specialist [facility]  [facility] Rehabilitation Services Office [phone] Ascom [phone] Fax [phone]

## 2022-11-22 ENCOUNTER — Ambulatory Visit: Payer: BC Managed Care – PPO | Admitting: Speech Pathology

## 2022-11-22 DIAGNOSIS — I63512 Cerebral infarction due to unspecified occlusion or stenosis of left middle cerebral artery: Secondary | ICD-10-CM

## 2022-11-22 DIAGNOSIS — R4701 Aphasia: Secondary | ICD-10-CM | POA: Diagnosis not present

## 2022-11-22 NOTE — Therapy (Signed)
OUTPATIENT SPEECH LANGUAGE PATHOLOGY  TREATMENT NOTE     Patient Name: [name] MRN: [id number] DOB:09/12/1965, 57 y.o., male Today's Date: 11/22/2022   PCP: Myrene Buddy, NP REFERRING PROVIDER: Mariam Dollar, PA   End of Session - 11/22/22 0834     Visit Number 59    Number of Visits 97    Date for SLP Re-Evaluation 01/26/23    Authorization Type [employer]    Progress Note Due on Visit 60    SLP Start Time 0800    SLP Stop Time  0845    SLP Time Calculation (min) 45 min    Activity Tolerance Patient tolerated treatment well              Past Medical History:  Diagnosis Date   Hypertension    LVH (left ventricular hypertrophy)    Myocardial infarction (HCC)    Vitamin D deficiency    Past Surgical History:  Procedure Laterality Date   ANTERIOR CRUCIATE LIGAMENT REPAIR     COLONOSCOPY WITH PROPOFOL N/A 04/11/2018   Procedure: COLONOSCOPY WITH PROPOFOL;  Surgeon: [place], Boykin Nearing, MD;  Location: [facility] ENDOSCOPY;  Service: Gastroenterology;  Laterality: N/A;   GASTRIC RESECTION     IR ANGIO INTRA EXTRACRAN SEL INTERNAL CAROTID UNI L MOD SED  08/15/2022   IR US GUIDE VASC ACCESS RIGHT  08/15/2022   LAPAROTOMY     LOOP RECORDER INSERTION N/A 08/18/2022   Procedure: LOOP RECORDER INSERTION;  Surgeon: Regan Lemming, MD;  Location: [facility] INVASIVE CV LAB;  Service: Cardiovascular;  Laterality: N/A;   RADIOLOGY WITH ANESTHESIA N/A 08/14/2022   Procedure: IR WITH ANESTHESIA;  Surgeon: Julieanne Cotton, MD;  Location: [facility] OR;  Service: Radiology;  Laterality: N/A;   TEE WITHOUT CARDIOVERSION N/A 08/18/2022   Procedure: TRANSESOPHAGEAL ECHOCARDIOGRAM;  Surgeon: [name], MD;  Location: [facility] INVASIVE CV LAB;  Service: Cardiovascular;  Laterality: N/A;   Patient Active Problem List   Diagnosis Date Noted   Essential hypertension, benign 08/18/2022   Hyperlipidemia 08/18/2022   DM (diabetes mellitus), type 2 (HCC) 08/18/2022   Obesity  (BMI 30-39.9) 08/18/2022   CAD (coronary artery disease) 08/18/2022   OSA (obstructive sleep apnea) 08/18/2022   Acute ischemic left MCA stroke (HCC) 08/18/2022   Acute ischemic right MCA stroke (HCC) 08/14/2022   Hypertensive urgency 04/27/2017   ONSET DATE: 08/14/2022; date of referral 08/23/2022    REFERRING DIAG: Z30.865 (ICD-10-CM) - Cerebral infarction due to unspecified occlusion or stenosis of left middle cerebral artery   THERAPY DIAG:  Aphasia   Apraxia   Cerebral infarction due to unspecified occlusion or stenosis of left middle cerebral artery (HCC)   Rationale for Evaluation and Treatment Rehabilitation   SUBJECTIVE:      PERTINENT HISTORY:  [name] is 57 year old male who presented to the [facility] ED on 08/14/2022 noted by wife to be very weak and confused and halfway enter out of the bathtub at home. Symptoms were sudden in onset. Last known well 9 PM. Patient presented with global aphasia. Code stroke activated. CT head negative for hemorrhage, aspects of 9, mild hyperdensity left MCA could indicate thrombus. Tenecteplase administered. CTA showed left M1 occlusion and neurointerventional radiology contacted. Patient transferred to [facility] for thrombectomy. On route he was noted to have periorbital welts and uvular swelling. Was concern of possible angioedema secondary to tenecteplase. He was intubated for airway protection and underwent cerebral angiogram by Dr. Tommie Sams. Recanalization of the  left MCA vascular tree noted. Critical care medicine consulted for management and placed on low-dose Neo-Synephrine infusion. Extubated on 4/23. 2D echo with EF of approximately 60 to 65% with trivial MVR. LDL 96, hemoglobin A1c 6.3%. He was started on heparin subcutaneously for VTE prophylaxis.     DIAGNOSTIC FINDINGS:    CT Head - 08/14/2022 Mild hyperdensity of the left MCA   CT Head and Neck Angio - 08/14/2022 Proximal occlusion of the left MCA M1 segment  with poor collateralization throughout the MCA territory.   IR Angio - 08/15/2022 Interval recanalization of the left M1/MCA occlusion seen on prior CT angiogram likely as a result of TNK administration. Therefore, no intervention performed.   MRI 08/15/2022 Acute cortical infarcts along the left frontal operculum and left anterior insula with additional small area of acute cortical infarct within left parietal lobe. Other areas of faint cortical DWI hyperintensity throughout the left MCA territory, including the left putamen, may reflect additional areas of infarct or ischemia. 2. Loss of the left transverse sinus flow void is favored to reflect slow flow. If clinical suspicion for thrombosis, CT venography could be performed for further characterization.     PAIN:  Are you having pain? No   FALLS: Has patient fallen in last 6 months?  No   LIVING ENVIRONMENT: Lives with: lives with their family Lives in: House/apartment   PLOF:  Level of assistance: Independent with ADLs, Independent with IADLs Employment: Full-time employment     PATIENT GOALS    to be able to communicate again   SUBJECTIVE STATEMENT:  Pt pleasant, answering basic yes/no questions about political new Pt accompanied by: pt's son, [name]   OBJECTIVE:   TODAY'S TREATMENT:  Skilled treatment session focused on pt's communication goals. SLP facilitated session by providing the following interventions:  SLP engaged pt in higher level word finding thru use of WALC 8: Word Finding Using Word Strategies p89. - Pt benefited from Moderate A  SLP also facilitated session by use of Constant Therapy Clinician App Read a Word you Hear - Level 1 - 95% independently Identify picture categories - Level 1 - 80%  PATIENT EDUCATION: Education details: see above Person educated: Patient, pt's son Education method: Explanation Education comprehension: verbalized understanding and needs further education   HOME  EXERCISE PROGRAM:  N/A   GOALS:   Goals reviewed with patient? Yes   SHORT TERM GOALS: Target date: 10 sessions   UPDATED GOALS: 09/12/2022 UPDATED GOALS: 10/17/2022 UPDATED GOALS: 11/03/2022 The patient will name common household objects at 80% accuracy given frequent maximum verbal and frequent maximum phonemic cues. Goal status: INITIAL Goal Status: MET for names of family members Goal Updated: With moderate assistance, pt will name common household objects at 80% accuracy. ONGOING; MET Goal Updated: (11/03/2022): With minimal assistance, pt will name common objects with > 90%.   2.  The patient will identify the correct word given 2 choices at 80% accuracy given frequent maximal visual cues in order to increase ability to comprehend simple instructions. Goal status: INITIAL Goal status: Ongoing; ONGOING; MET   3.  The patient will follow simple body commands presented auditorily at 80% accuracy given frequent maximal visual cues. Goal status: INITIAL Goal status: Ongoing; ONGOING; MET   4.  The patient will say functional words (e.g., water, toilet) at 50% accuracy given frequent maximal phonemic placement cues in order to communicate ability to communicate basic wants and needs. Goal status: INITIAL Goal status: MET Goal updated: The patient will  say functional words (e.g., water, toilet) at 75% accuracy given frequent maximal phonemic placement cues in order to communicate ability to communicate basic wants and needs. MET  5.  The patient will follow simple body commands presented auditorily at 80% accuracy given frequent maximal visual cues. Goal status: INITIAL      LONG TERM GOALS: Target date: 01/26/2023   Updated: 09/12/2022  Updated: 10/17/2022  Updated: 11/03/2022 Pt will use multimodal means of communication to express basic biographical information about himself with moderate assistance.  Baseline:  Goal status: INITIAL Goal status: ONGOING; progress made;  MET Updated Goal (11/03/2022): Pt will use multimodal means of communication to express basic biographical information about himself, activities within his day and his family with minimal assistance.     2.  Pt will demonstrate > 90% speech intelligibility when reading orientation information.  Baseline:  Goal status: INITIAL Goal status: ONGOING; progress made  3. Pt will answer semi-complex yes/no questions with > 75% accuracy given minimal cues.   Baseline: 75% with simple yes/no questions  Goal status: INITIAL     ASSESSMENT:   CLINICAL IMPRESSION: Pt is a 57 year old right handed male who was seen today for a speech-language treatment d/t moderate transcortical sensory aphasia. Pt with improved ability to verbally produce functional sentence to instruct his younger on household tasks during today's session.  See treatment note for more details.   OBJECTIVE IMPAIRMENTS include expressive language, receptive language, apraxia, and reading comprehension and ability to produce written language . These impairments are limiting patient from return to work, managing medications, managing appointments, managing finances, household responsibilities, ADLs/IADLs, and effectively communicating at home and in community. Factors affecting potential to achieve goals and functional outcome are severity of impairments. Patient will benefit from skilled SLP services to address above impairments and improve overall function.   REHAB POTENTIAL: Excellent   PLAN: SLP FREQUENCY: 3x/week to 4xweek   SLP DURATION: 12 weeks   PLANNED INTERVENTIONS: Language facilitation, Cueing hierachy, Functional tasks, Multimodal communication approach, SLP instruction and feedback, Compensatory strategies, and Patient/family education       Crystel Demarco B. Dreama Saa, M.S., CCC-SLP, Tree surgeon Certified Brain Injury Specialist [facility]  [facility] Rehabilitation  Services Office [phone] Ascom [phone] Fax [phone]

## 2022-11-23 ENCOUNTER — Ambulatory Visit: Payer: BC Managed Care – PPO | Admitting: Speech Pathology

## 2022-11-23 DIAGNOSIS — I63512 Cerebral infarction due to unspecified occlusion or stenosis of left middle cerebral artery: Secondary | ICD-10-CM

## 2022-11-23 DIAGNOSIS — R4701 Aphasia: Secondary | ICD-10-CM

## 2022-11-23 NOTE — Therapy (Signed)
OUTPATIENT SPEECH LANGUAGE PATHOLOGY  TREATMENT NOTE 10th VISIT PROGRESS NOTE     Patient Name: [name] MRN: [id number] DOB:Apr 25, 1966, 58 y.o., male Today's Date: 11/23/2022   PCP: Myrene Buddy, NP REFERRING PROVIDER: Mariam Dollar, PA  Speech Therapy Progress Note  Dates of Reporting Period: 11/07/2022 to 11/23/2022  Objective: Patient has been seen for 10 speech therapy sessions this reporting period targeting pt's aphasia. Patient is making progress toward LTGs and met 3 STGs this reporting period. See skilled intervention, clinical impressions, and goals below for details.    End of Session - 11/23/22 0820     Visit Number 60    Number of Visits 97    Date for SLP Re-Evaluation 01/26/23    Authorization Type [employer]    Progress Note Due on Visit 60    SLP Start Time 0800    SLP Stop Time  0845    SLP Time Calculation (min) 45 min    Activity Tolerance Patient tolerated treatment well              Past Medical History:  Diagnosis Date   Hypertension    LVH (left ventricular hypertrophy)    Myocardial infarction (HCC)    Vitamin D deficiency    Past Surgical History:  Procedure Laterality Date   ANTERIOR CRUCIATE LIGAMENT REPAIR     COLONOSCOPY WITH PROPOFOL N/A 04/11/2018   Procedure: COLONOSCOPY WITH PROPOFOL;  Surgeon: Toledo, Boykin Nearing, MD;  Location: [facility] ENDOSCOPY;  Service: Gastroenterology;  Laterality: N/A;   GASTRIC RESECTION     IR ANGIO INTRA EXTRACRAN SEL INTERNAL CAROTID UNI L MOD SED  08/15/2022   IR US GUIDE VASC ACCESS RIGHT  08/15/2022   LAPAROTOMY     LOOP RECORDER INSERTION N/A 08/18/2022   Procedure: LOOP RECORDER INSERTION;  Surgeon: Regan Lemming, MD;  [facility]: [facility] INVASIVE CV LAB;  Service: Cardiovascular;  Laterality: N/A;   RADIOLOGY WITH ANESTHESIA N/A 08/14/2022   Procedure: IR WITH ANESTHESIA;  Surgeon: Julieanne Cotton, MD;  Location: [facility] OR;  Service: Radiology;  Laterality: N/A;   TEE  WITHOUT CARDIOVERSION N/A 08/18/2022   Procedure: TRANSESOPHAGEAL ECHOCARDIOGRAM;  Surgeon: [name], MD;  Location: [facility] INVASIVE CV LAB;  Service: Cardiovascular;  Laterality: N/A;   Patient Active Problem List   Diagnosis Date Noted   Essential hypertension, benign 08/18/2022   Hyperlipidemia 08/18/2022   DM (diabetes mellitus), type 2 (HCC) 08/18/2022   Obesity (BMI 30-39.9) 08/18/2022   CAD (coronary artery disease) 08/18/2022   OSA (obstructive sleep apnea) 08/18/2022   Acute ischemic left MCA stroke (HCC) 08/18/2022   Acute ischemic right MCA stroke (HCC) 08/14/2022   Hypertensive urgency 04/27/2017   ONSET DATE: 08/14/2022; date of referral 08/23/2022    REFERRING DIAG: Q46.962 (ICD-10-CM) - Cerebral infarction due to unspecified occlusion or stenosis of left middle cerebral artery   THERAPY DIAG:  Aphasia   Apraxia   Cerebral infarction due to unspecified occlusion or stenosis of left middle cerebral artery (HCC)   Rationale for Evaluation and Treatment Rehabilitation   SUBJECTIVE:      PERTINENT HISTORY:  [name] is 57 year old male who presented to the [facility] ED on 08/14/2022 noted by wife to be very weak and confused and halfway enter out of the bathtub at home. Symptoms were sudden in onset. Last known well 9 PM. Patient presented with global aphasia. Code stroke activated. CT head negative for hemorrhage, aspects of 9, mild hyperdensity left MCA could  indicate thrombus. Tenecteplase administered. CTA showed left M1 occlusion and neurointerventional radiology contacted. Patient transferred to [facility] for thrombectomy. On route he was noted to have periorbital welts and uvular swelling. Was concern of possible angioedema secondary to tenecteplase. He was intubated for airway protection and underwent cerebral angiogram by Dr. Tommie Sams. Recanalization of the left MCA vascular tree noted. Critical care medicine consulted for management and placed on  low-dose Neo-Synephrine infusion. Extubated on 4/23. 2D echo with EF of approximately 60 to 65% with trivial MVR. LDL 96, hemoglobin A1c 6.3%. He was started on heparin subcutaneously for VTE prophylaxis.     DIAGNOSTIC FINDINGS:    CT Head - 08/14/2022 Mild hyperdensity of the left MCA   CT Head and Neck Angio - 08/14/2022 Proximal occlusion of the left MCA M1 segment with poor collateralization throughout the MCA territory.   IR Angio - 08/15/2022 Interval recanalization of the left M1/MCA occlusion seen on prior CT angiogram likely as a result of TNK administration. Therefore, no intervention performed.   MRI 08/15/2022 Acute cortical infarcts along the left frontal operculum and left anterior insula with additional small area of acute cortical infarct within left parietal lobe. Other areas of faint cortical DWI hyperintensity throughout the left MCA territory, including the left putamen, may reflect additional areas of infarct or ischemia. 2. Loss of the left transverse sinus flow void is favored to reflect slow flow. If clinical suspicion for thrombosis, CT venography could be performed for further characterization.     PAIN:  Are you having pain? No   FALLS: Has patient fallen in last 6 months?  No   LIVING ENVIRONMENT: Lives with: lives with their family Lives in: House/apartment   PLOF:  Level of assistance: Independent with ADLs, Independent with IADLs Employment: Full-time employment     PATIENT GOALS    to be able to communicate again   SUBJECTIVE STATEMENT:  Pt pleasant, answering basic yes/no questions about political new Pt accompanied by: pt's son, [name]   OBJECTIVE:   TODAY'S TREATMENT:  Skilled treatment session focused on pt's communication goals. SLP facilitated session by providing the following interventions:  SLP also facilitated session by use of Constant Therapy Clinician App to target reading comprehension and higher level language  categorization Read a Word you Hear - Level 1 - 95% independently Identify picture categories - Level 1 - 80%  In light of pt's progress note, the Aphasia Impact Questionnaire (AIQ-21) was administered. It should be noted that pt has made great progress to be able to participate in completing the questionnaire.    AIQ-21 Aphasia Impact Questionnaire  Summary Score Sheet  The AIQ is a tool that enables people with aphasia to express their views and experience of aphasia. It enables the administrator and person with aphasia to explore and rate the impact of acquiring and living with aphasia. The AIQ-21 enables the person with aphasia to do this with or without the use of spoken output.     The AIQ-21 consists of 21 items: 6 items in the Communication domain, 7 items in the Participation domain, and 11 items in the Emotional state/Well-being domain. Total scores range from 0 to 84, with higher scores indicating higher impact of aphasia (0 = no problem to 4 = impossible).      COMMUNICATION   Patient's Response   How easy was it to talk to someone close to you?   1    2. How easy was it for you to  talk to a stranger?   Pt didn't do this prior to stroke   3. How easy was it for you to understand someone close to you?   1   4. How easy was it for you to understand a stranger?   2   5. How easy was it for you to write a letter to a friend?   Pt didn't do this prior to stroke   6. How easy was it for you to read a whole story in the newspaper?   4    PARTICIPATION     7. How easy was it for you to do the things you have to do?   0   8. Did you have enough positive things to do -  things you wanted to do?   1   9. How were things with friends?   0   10. How were  things with your family?  0   EMOTIONAL STATE/WELLBEING    11. Have you felt frustrated?   2   12. Have you felt worried?   0   13. Have you felt unhappy?   0   14. Have you felt  helpless?   0   15. Have you felt bored?   0   16. Have you felt embarrassed?   0   17. Have you felt angry?   1   18. Have you felt isolated?   0   19. Some people  tell us they  feel stupid.  I know  you are not stupid.  But this week..... have you felt stupid?    0   20. Have you felt confident? * = descriptors reversed within the AIQ scale    0   21. How do you feel about the future? * = descriptors reversed within the AIQ scale    0   Total AIQ Score    Unable to total d/t non-participation in some questions but overall improved abilities     PATIENT EDUCATION: Education details: see above Person educated: Patient, pt's son Education method: Explanation Education comprehension: verbalized understanding and needs further education   HOME EXERCISE PROGRAM:  N/A   GOALS:   Goals reviewed with patient? Yes   SHORT TERM GOALS: Target date: 10 sessions   SEE PREVIOUS NOTES FOR PROGRESS TOWARDS GOALS  UPDATED: 11/23/2022  5.  The patient will follow simple body commands presented auditorily at 80% accuracy given frequent maximal visual cues. Goal status: INITIAL:  MET   6.  With minimal cues, pt will demonstrate improved reading comprehension by following 2 step written directions with 75% accuracy.  Baseline:  Goal status: INITIAL  7.  With minimal cues, pt will type phrase level basic functional statements (3-4 words in length) in 5 out of 7 opportunities.  Baseline:  Goal status: INITIAL  8.  With minimal cues, pt will imitate basic functional phrase level statements with 75% speech intelligibility.  Baseline:  Goal status: INITIAL  LONG TERM GOALS: Target date: 01/26/2023   SEE PREVIOUS NOTES FOR PROGRESS TOWARDS GOALS  UPDATED: 11/23/2022  Pt will use multimodal means of communication to express basic biographical information about himself with moderate assistance.  Baseline:  Goal status: INITIAL Goal status: ONGOING; progress  made; MET Updated Goal (11/03/2022): Pt will use multimodal means of communication to express basic biographical information about himself, activities within his day and his family with minimal assistance: ongoing    2.  Pt will demonstrate > 90%  speech intelligibility when reading orientation information.  Baseline:  Goal status: INITIAL Goal status: ONGOING; progress made  3. Pt will answer semi-complex yes/no questions with > 75% accuracy given minimal cues.   Baseline: 75% with simple yes/no questions  Goal status: INITIAL: MET     ASSESSMENT:   CLINICAL IMPRESSION: Pt is a 57 year old right handed male who was seen today for a speech-language treatment d/t moderate transcortical sensory aphasia. Pt continues to make good progress as indicated by he ability to continue meeting his goals. See treatment note for more details.   OBJECTIVE IMPAIRMENTS include expressive language, receptive language, apraxia, and reading comprehension and ability to produce written language . These impairments are limiting patient from return to work, managing medications, managing appointments, managing finances, household responsibilities, ADLs/IADLs, and effectively communicating at home and in community. Factors affecting potential to achieve goals and functional outcome are severity of impairments. Patient will benefit from skilled SLP services to address above impairments and improve overall function.   REHAB POTENTIAL: Excellent   PLAN: SLP FREQUENCY: 3x/week to 4xweek   SLP DURATION: 12 weeks   PLANNED INTERVENTIONS: Language facilitation, Cueing hierachy, Functional tasks, Multimodal communication approach, SLP instruction and feedback, Compensatory strategies, and Patient/family education       Deema Juncaj B. Dreama Saa, M.S., CCC-SLP, Tree surgeon Certified Brain Injury Specialist [facility]  [facility] Rehabilitation Services Office  [phone] Ascom [phone] Fax [phone]

## 2022-11-24 ENCOUNTER — Ambulatory Visit: Payer: BC Managed Care – PPO | Attending: Physician Assistant | Admitting: Speech Pathology

## 2022-11-24 ENCOUNTER — Other Ambulatory Visit: Payer: Self-pay

## 2022-11-24 DIAGNOSIS — R482 Apraxia: Secondary | ICD-10-CM | POA: Diagnosis present

## 2022-11-24 DIAGNOSIS — I63512 Cerebral infarction due to unspecified occlusion or stenosis of left middle cerebral artery: Secondary | ICD-10-CM | POA: Diagnosis present

## 2022-11-24 DIAGNOSIS — R4701 Aphasia: Secondary | ICD-10-CM | POA: Diagnosis present

## 2022-11-24 NOTE — Therapy (Signed)
OUTPATIENT SPEECH LANGUAGE PATHOLOGY  TREATMENT NOTE     Patient Name: [name] MRN: [id number] DOB:1966/02/13, 57 y.o., male Today's Date: 11/24/2022   PCP: Myrene Buddy, NP REFERRING PROVIDER: Mariam Dollar, PA     End of Session - 11/24/22 0818     Visit Number 61    Number of Visits 97    Date for SLP Re-Evaluation 01/26/23    Authorization Type [employer]    Progress Note Due on Visit 70    SLP Start Time 0800    SLP Stop Time  0845    SLP Time Calculation (min) 45 min    Activity Tolerance Patient tolerated treatment well              Past Medical History:  Diagnosis Date   Hypertension    LVH (left ventricular hypertrophy)    Myocardial infarction (HCC)    Vitamin D deficiency    Past Surgical History:  Procedure Laterality Date   ANTERIOR CRUCIATE LIGAMENT REPAIR     COLONOSCOPY WITH PROPOFOL N/A 04/11/2018   Procedure: COLONOSCOPY WITH PROPOFOL;  Surgeon: Toledo, Boykin Nearing, MD;  Location: [facility] ENDOSCOPY;  Service: Gastroenterology;  Laterality: N/A;   GASTRIC RESECTION     IR ANGIO INTRA EXTRACRAN SEL INTERNAL CAROTID UNI L MOD SED  08/15/2022   IR US GUIDE VASC ACCESS RIGHT  08/15/2022   LAPAROTOMY     LOOP RECORDER INSERTION N/A 08/18/2022   Procedure: LOOP RECORDER INSERTION;  Surgeon: Regan Lemming, MD;  Location: [facility] INVASIVE CV LAB;  Service: Cardiovascular;  Laterality: N/A;   RADIOLOGY WITH ANESTHESIA N/A 08/14/2022   Procedure: IR WITH ANESTHESIA;  Surgeon: Julieanne Cotton, MD;  Location: [facility] OR;  Service: Radiology;  Laterality: N/A;   TEE WITHOUT CARDIOVERSION N/A 08/18/2022   Procedure: TRANSESOPHAGEAL ECHOCARDIOGRAM;  Surgeon: [name], MD;  Location: [facility] INVASIVE CV LAB;  Service: Cardiovascular;  Laterality: N/A;   Patient Active Problem List   Diagnosis Date Noted   Essential hypertension, benign 08/18/2022   Hyperlipidemia 08/18/2022   DM (diabetes mellitus), type 2 (HCC) 08/18/2022   Obesity  (BMI 30-39.9) 08/18/2022   CAD (coronary artery disease) 08/18/2022   OSA (obstructive sleep apnea) 08/18/2022   Acute ischemic left MCA stroke (HCC) 08/18/2022   Acute ischemic right MCA stroke (HCC) 08/14/2022   Hypertensive urgency 04/27/2017   ONSET DATE: 08/14/2022; date of referral 08/23/2022    REFERRING DIAG: N82.956 (ICD-10-CM) - Cerebral infarction due to unspecified occlusion or stenosis of left middle cerebral artery   THERAPY DIAG:  Aphasia   Apraxia   Cerebral infarction due to unspecified occlusion or stenosis of left middle cerebral artery (HCC)   Rationale for Evaluation and Treatment Rehabilitation   SUBJECTIVE:      PERTINENT HISTORY:  [name] is 57 year old male who presented to the [facility] ED on 08/14/2022 noted by wife to be very weak and confused and halfway enter out of the bathtub at home. Symptoms were sudden in onset. Last known well 9 PM. Patient presented with global aphasia. Code stroke activated. CT head negative for hemorrhage, aspects of 9, mild hyperdensity left MCA could indicate thrombus. Tenecteplase administered. CTA showed left M1 occlusion and neurointerventional radiology contacted. Patient transferred to [facility] for thrombectomy. On route he was noted to have periorbital welts and uvular swelling. Was concern of possible angioedema secondary to tenecteplase. He was intubated for airway protection and underwent cerebral angiogram by Dr. Tommie Sams. Recanalization  of the left MCA vascular tree noted. Critical care medicine consulted for management and placed on low-dose Neo-Synephrine infusion. Extubated on 4/23. 2D echo with EF of approximately 60 to 65% with trivial MVR. LDL 96, hemoglobin A1c 6.3%. He was started on heparin subcutaneously for VTE prophylaxis.     DIAGNOSTIC FINDINGS:    CT Head - 08/14/2022 Mild hyperdensity of the left MCA   CT Head and Neck Angio - 08/14/2022 Proximal occlusion of the left MCA M1 segment  with poor collateralization throughout the MCA territory.   IR Angio - 08/15/2022 Interval recanalization of the left M1/MCA occlusion seen on prior CT angiogram likely as a result of TNK administration. Therefore, no intervention performed.   MRI 08/15/2022 Acute cortical infarcts along the left frontal operculum and left anterior insula with additional small area of acute cortical infarct within left parietal lobe. Other areas of faint cortical DWI hyperintensity throughout the left MCA territory, including the left putamen, may reflect additional areas of infarct or ischemia. 2. Loss of the left transverse sinus flow void is favored to reflect slow flow. If clinical suspicion for thrombosis, CT venography could be performed for further characterization.     PAIN:  Are you having pain? No   FALLS: Has patient fallen in last 6 months?  No   LIVING ENVIRONMENT: Lives with: lives with their family Lives in: House/apartment   PLOF:  Level of assistance: Independent with ADLs, Independent with IADLs Employment: Full-time employment     PATIENT GOALS    to be able to communicate again   SUBJECTIVE STATEMENT:  Pt pleasant, answering basic yes/no questions about political new Pt accompanied by: pt's son, [name]   OBJECTIVE:   TODAY'S TREATMENT:  Skilled treatment session focused on pt's communication goals. SLP facilitated session by providing the following interventions:  Constant Therapy Clinician app utilized to target phonological processing: Pick letter to match the sounds - Level 1 - with verbal rehearsal 70% accuracy Level 2 - verbal rehearsal and occasional simple word - 90% Pick the sound to match letters - Level 1 -  with verbal repetition increased difficulty noted 85 to 93%    PATIENT EDUCATION: Education details: see above Person educated: Patient, pt's son Education method: Explanation Education comprehension: verbalized understanding and needs further  education   HOME EXERCISE PROGRAM:  N/A   GOALS:   Goals reviewed with patient? Yes   SHORT TERM GOALS: Target date: 10 sessions   SEE PREVIOUS NOTES FOR PROGRESS TOWARDS GOALS  UPDATED: 11/23/2022  5.  The patient will follow simple body commands presented auditorily at 80% accuracy given frequent maximal visual cues. Goal status: INITIAL:  MET   6.  With minimal cues, pt will demonstrate improved reading comprehension by following 2 step written directions with 75% accuracy.  Baseline:  Goal status: INITIAL  7.  With minimal cues, pt will type phrase level basic functional statements (3-4 words in length) in 5 out of 7 opportunities.  Baseline:  Goal status: INITIAL  8.  With minimal cues, pt will imitate basic functional phrase level statements with 75% speech intelligibility.  Baseline:  Goal status: INITIAL  LONG TERM GOALS: Target date: 01/26/2023   SEE PREVIOUS NOTES FOR PROGRESS TOWARDS GOALS  UPDATED: 11/23/2022  Pt will use multimodal means of communication to express basic biographical information about himself with moderate assistance.  Baseline:  Goal status: INITIAL Goal status: ONGOING; progress made; MET Updated Goal (11/03/2022): Pt will use multimodal means of communication to express  basic biographical information about himself, activities within his day and his family with minimal assistance: ongoing    2.  Pt will demonstrate > 90% speech intelligibility when reading orientation information.  Baseline:  Goal status: INITIAL Goal status: ONGOING; progress made  3. Pt will answer semi-complex yes/no questions with > 75% accuracy given minimal cues.   Baseline: 75% with simple yes/no questions  Goal status: INITIAL: MET     ASSESSMENT:   CLINICAL IMPRESSION: Pt is a 57 year old right handed male who was seen today for a speech-language treatment d/t moderate transcortical sensory aphasia. Increased difficulty with phonological processing. See  treatment note for more details.   OBJECTIVE IMPAIRMENTS include expressive language, receptive language, apraxia, and reading comprehension and ability to produce written language . These impairments are limiting patient from return to work, managing medications, managing appointments, managing finances, household responsibilities, ADLs/IADLs, and effectively communicating at home and in community. Factors affecting potential to achieve goals and functional outcome are severity of impairments. Patient will benefit from skilled SLP services to address above impairments and improve overall function.   REHAB POTENTIAL: Excellent   PLAN: SLP FREQUENCY: 3x/week to 4xweek   SLP DURATION: 12 weeks   PLANNED INTERVENTIONS: Language facilitation, Cueing hierachy, Functional tasks, Multimodal communication approach, SLP instruction and feedback, Compensatory strategies, and Patient/family education       Exander Shaul B. Dreama Saa, M.S., CCC-SLP, Tree surgeon Certified Brain Injury Specialist [facility]  [facility] Rehabilitation Services Office [phone] Ascom [phone] Fax [phone]

## 2022-11-24 NOTE — Patient Outreach (Signed)
No Telephone outreach to patient. Dr. Pearlean Brownie obtained mRS successfully. MRS= 3  Vanice Sarah [facility] Care Management Assistant [phone]

## 2022-11-28 ENCOUNTER — Ambulatory Visit: Payer: BC Managed Care – PPO | Admitting: Speech Pathology

## 2022-11-29 ENCOUNTER — Ambulatory Visit: Payer: BC Managed Care – PPO | Admitting: Speech Pathology

## 2022-11-30 ENCOUNTER — Ambulatory Visit: Payer: BC Managed Care – PPO | Admitting: Speech Pathology

## 2022-11-30 DIAGNOSIS — I63512 Cerebral infarction due to unspecified occlusion or stenosis of left middle cerebral artery: Secondary | ICD-10-CM

## 2022-11-30 DIAGNOSIS — R482 Apraxia: Secondary | ICD-10-CM

## 2022-11-30 DIAGNOSIS — R4701 Aphasia: Secondary | ICD-10-CM

## 2022-11-30 NOTE — Therapy (Signed)
OUTPATIENT SPEECH LANGUAGE PATHOLOGY  TREATMENT NOTE     Patient Name: [name] MRN: [id number] DOB:May 10, 1965, 57 y.o., male Today's Date: 11/30/2022   PCP: Jon Buddy, NP REFERRING PROVIDER: Mariam Dollar, PA     End of Session - 11/30/22 0926     Visit Number 62    Number of Visits 97    Date for SLP Re-Evaluation 01/26/23    Authorization Type [employer]    Progress Note Due on Visit 70    SLP Start Time 0848    SLP Stop Time  0930    SLP Time Calculation (min) 42 min    Activity Tolerance Patient tolerated treatment well              Past Medical History:  Diagnosis Date   Hypertension    LVH (left ventricular hypertrophy)    Myocardial infarction (HCC)    Vitamin D deficiency    Past Surgical History:  Procedure Laterality Date   ANTERIOR CRUCIATE LIGAMENT REPAIR     COLONOSCOPY WITH PROPOFOL N/A 04/11/2018   Procedure: COLONOSCOPY WITH PROPOFOL;  Surgeon: Toledo, Boykin Nearing, MD;  Location: [facility] ENDOSCOPY;  Service: Gastroenterology;  Laterality: N/A;   GASTRIC RESECTION     IR ANGIO INTRA EXTRACRAN SEL INTERNAL CAROTID UNI L MOD SED  08/15/2022   IR US GUIDE VASC ACCESS RIGHT  08/15/2022   LAPAROTOMY     LOOP RECORDER INSERTION N/A 08/18/2022   Procedure: LOOP RECORDER INSERTION;  Surgeon: Regan Lemming, MD;  Location: [facility] INVASIVE CV LAB;  Service: Cardiovascular;  Laterality: N/A;   RADIOLOGY WITH ANESTHESIA N/A 08/14/2022   Procedure: IR WITH ANESTHESIA;  Surgeon: Julieanne Cotton, MD;  Location: [facility] OR;  Service: Radiology;  Laterality: N/A;   TEE WITHOUT CARDIOVERSION N/A 08/18/2022   Procedure: TRANSESOPHAGEAL ECHOCARDIOGRAM;  Surgeon: [name], MD;  Location: [facility] INVASIVE CV LAB;  Service: Cardiovascular;  Laterality: N/A;   Patient Active Problem List   Diagnosis Date Noted   Essential hypertension, benign 08/18/2022   Hyperlipidemia 08/18/2022   DM (diabetes mellitus), type 2 (HCC) 08/18/2022   Obesity  (BMI 30-39.9) 08/18/2022   CAD (coronary artery disease) 08/18/2022   OSA (obstructive sleep apnea) 08/18/2022   Acute ischemic left MCA stroke (HCC) 08/18/2022   Acute ischemic right MCA stroke (HCC) 08/14/2022   Hypertensive urgency 04/27/2017   ONSET DATE: 08/14/2022; date of referral 08/23/2022    REFERRING DIAG: G31.517 (ICD-10-CM) - Cerebral infarction due to unspecified occlusion or stenosis of left middle cerebral artery   THERAPY DIAG:  Aphasia   Apraxia   Cerebral infarction due to unspecified occlusion or stenosis of left middle cerebral artery (HCC)   Rationale for Evaluation and Treatment Rehabilitation   SUBJECTIVE:      PERTINENT HISTORY:  [name] is 57 year old male who presented to the [facility] ED on 08/14/2022 noted by wife to be very weak and confused and halfway enter out of the bathtub at home. Symptoms were sudden in onset. Last known well 9 PM. Patient presented with global aphasia. Code stroke activated. CT head negative for hemorrhage, aspects of 9, mild hyperdensity left MCA could indicate thrombus. Tenecteplase administered. CTA showed left M1 occlusion and neurointerventional radiology contacted. Patient transferred to [facility] for thrombectomy. On route he was noted to have periorbital welts and uvular swelling. Was concern of possible angioedema secondary to tenecteplase. He was intubated for airway protection and underwent cerebral angiogram by Dr. Tommie Sams. Recanalization  of the left MCA vascular tree noted. Critical care medicine consulted for management and placed on low-dose Neo-Synephrine infusion. Extubated on 4/23. 2D echo with EF of approximately 60 to 65% with trivial MVR. LDL 96, hemoglobin A1c 6.3%. He was started on heparin subcutaneously for VTE prophylaxis.     DIAGNOSTIC FINDINGS:    CT Head - 08/14/2022 Mild hyperdensity of the left MCA   CT Head and Neck Angio - 08/14/2022 Proximal occlusion of the left MCA M1 segment  with poor collateralization throughout the MCA territory.   IR Angio - 08/15/2022 Interval recanalization of the left M1/MCA occlusion seen on prior CT angiogram likely as a result of TNK administration. Therefore, no intervention performed.   MRI 08/15/2022 Acute cortical infarcts along the left frontal operculum and left anterior insula with additional small area of acute cortical infarct within left parietal lobe. Other areas of faint cortical DWI hyperintensity throughout the left MCA territory, including the left putamen, may reflect additional areas of infarct or ischemia. 2. Loss of the left transverse sinus flow void is favored to reflect slow flow. If clinical suspicion for thrombosis, CT venography could be performed for further characterization.     PAIN:  Are you having pain? No   FALLS: Has patient fallen in last 6 months?  No   LIVING ENVIRONMENT: Lives with: lives with their family Lives in: House/apartment   PLOF:  Level of assistance: Independent with ADLs, Independent with IADLs Employment: Full-time employment     PATIENT GOALS    to be able to communicate again   SUBJECTIVE STATEMENT:  Pt pleasant and cooperative. Endorsed feeling "tired" from "working". Pt accompanied by: pt's son, [name]   OBJECTIVE:   TODAY'S TREATMENT:  Skilled treatment session focused on pt's communication goals. SLP facilitated session by providing the following interventions:  [employer] Clinician app utilized to target phonological processing: Minimal pairs - Level 1 - with verbal rehearsal and occasional repetition by SLP - 90% Pick letter to match the sounds - Level 1 - with verbal rehearsal 70% accuracy Level 2 - verbal rehearsal and occasional simple word - 90% accuracy Pick the sound to match letters - Level 1 -  with verbal repetition - 80% accuracy Identifying sounds in words - with verbal rehearsal and repetition by pt and SLP - 80% accuracy  With mod/max  verbal hierarchical cues, pt repeated 1-3 syllable words with 70% accuracy. Improving to 80% accuracy with self-correction. Attempted sentence level repetition, pt able to recall 3-4 word sentences with 33% accuracy independently improving with 100% with max verbal hierarchical cueing.  Pt able to engage to simple conversation regarding family and the [place] with use of verbal communication paired with text-to-speech with min assistance.    PATIENT EDUCATION: Education details: see above Person educated: Patient, pt's son Education method: Explanation Education comprehension: verbalized understanding and needs further education   HOME EXERCISE PROGRAM:  N/A   GOALS:   Goals reviewed with patient? Yes   SHORT TERM GOALS: Target date: 10 sessions   SEE PREVIOUS NOTES FOR PROGRESS TOWARDS GOALS  UPDATED: 11/23/2022  5.  The patient will follow simple body commands presented auditorily at 80% accuracy given frequent maximal visual cues. Goal status: INITIAL:  MET   6.  With minimal cues, pt will demonstrate improved reading comprehension by following 2 step written directions with 75% accuracy.  Baseline:  Goal status: INITIAL  7.  With minimal cues, pt will type phrase level basic functional statements (3-4 words in  length) in 5 out of 7 opportunities.  Baseline:  Goal status: INITIAL  8.  With minimal cues, pt will imitate basic functional phrase level statements with 75% speech intelligibility.  Baseline:  Goal status: INITIAL  LONG TERM GOALS: Target date: 01/26/2023   SEE PREVIOUS NOTES FOR PROGRESS TOWARDS GOALS  UPDATED: 11/23/2022  Pt will use multimodal means of communication to express basic biographical information about himself with moderate assistance.  Baseline:  Goal status: INITIAL Goal status: ONGOING; progress made; MET Updated Goal (11/03/2022): Pt will use multimodal means of communication to express basic biographical information about himself,  activities within his day and his family with minimal assistance: ongoing    2.  Pt will demonstrate > 90% speech intelligibility when reading orientation information.  Baseline:  Goal status: INITIAL Goal status: ONGOING; progress made  3. Pt will answer semi-complex yes/no questions with > 75% accuracy given minimal cues.   Baseline: 75% with simple yes/no questions  Goal status: INITIAL: MET     ASSESSMENT:   CLINICAL IMPRESSION: Pt is a 57 year old right handed male who was seen today for a speech-language treatment d/t moderate transcortical sensory aphasia. Increased difficulty with phonological processing. See treatment note for more details.   OBJECTIVE IMPAIRMENTS include expressive language, receptive language, apraxia, and reading comprehension and ability to produce written language . These impairments are limiting patient from return to work, managing medications, managing appointments, managing finances, household responsibilities, ADLs/IADLs, and effectively communicating at home and in community. Factors affecting potential to achieve goals and functional outcome are severity of impairments. Patient will benefit from skilled SLP services to address above impairments and improve overall function.   REHAB POTENTIAL: Excellent   PLAN: SLP FREQUENCY: 3x/week to 4xweek   SLP DURATION: 12 weeks   PLANNED INTERVENTIONS: Language facilitation, Cueing hierachy, Functional tasks, Multimodal communication approach, SLP instruction and feedback, Compensatory strategies, and Patient/family education       Clyde Canterbury, M.S., CCC-SLP Speech-Language Pathologist [facility] [phone] (ASCOM)

## 2022-12-01 ENCOUNTER — Ambulatory Visit: Payer: BC Managed Care – PPO | Admitting: Speech Pathology

## 2022-12-02 ENCOUNTER — Ambulatory Visit: Payer: BC Managed Care – PPO | Admitting: Speech Pathology

## 2022-12-05 ENCOUNTER — Ambulatory Visit: Payer: BC Managed Care – PPO | Admitting: Speech Pathology

## 2022-12-05 ENCOUNTER — Ambulatory Visit: Payer: BC Managed Care – PPO

## 2022-12-05 DIAGNOSIS — I63512 Cerebral infarction due to unspecified occlusion or stenosis of left middle cerebral artery: Secondary | ICD-10-CM

## 2022-12-05 DIAGNOSIS — R4701 Aphasia: Secondary | ICD-10-CM

## 2022-12-05 DIAGNOSIS — I63511 Cerebral infarction due to unspecified occlusion or stenosis of right middle cerebral artery: Secondary | ICD-10-CM | POA: Diagnosis not present

## 2022-12-05 NOTE — Therapy (Signed)
OUTPATIENT SPEECH LANGUAGE PATHOLOGY  TREATMENT NOTE     Patient Name: [name] MRN: [id number] DOB:1965-10-20, 57 y.o., male Today's Date: 12/05/2022   PCP: Myrene Buddy, NP REFERRING PROVIDER: Mariam Dollar, PA     End of Session - 12/05/22 0947     Visit Number 63    Number of Visits 97    Date for SLP Re-Evaluation 01/26/23    Authorization Type [employer]    Progress Note Due on Visit 70    SLP Start Time 0845    SLP Stop Time  0915    SLP Time Calculation (min) 30 min    Activity Tolerance Patient tolerated treatment well              Past Medical History:  Diagnosis Date   Hypertension    LVH (left ventricular hypertrophy)    Myocardial infarction (HCC)    Vitamin D deficiency    Past Surgical History:  Procedure Laterality Date   ANTERIOR CRUCIATE LIGAMENT REPAIR     COLONOSCOPY WITH PROPOFOL N/A 04/11/2018   Procedure: COLONOSCOPY WITH PROPOFOL;  Surgeon: Toledo, Boykin Nearing, MD;  Location: [facility] ENDOSCOPY;  Service: Gastroenterology;  Laterality: N/A;   GASTRIC RESECTION     IR ANGIO INTRA EXTRACRAN SEL INTERNAL CAROTID UNI L MOD SED  08/15/2022   IR US GUIDE VASC ACCESS RIGHT  08/15/2022   LAPAROTOMY     LOOP RECORDER INSERTION N/A 08/18/2022   Procedure: LOOP RECORDER INSERTION;  Surgeon: Regan Lemming, MD;  Location: [facility] INVASIVE CV LAB;  Service: Cardiovascular;  Laterality: N/A;   RADIOLOGY WITH ANESTHESIA N/A 08/14/2022   Procedure: IR WITH ANESTHESIA;  Surgeon: Julieanne Cotton, MD;  Location: [facility] OR;  Service: Radiology;  Laterality: N/A;   TEE WITHOUT CARDIOVERSION N/A 08/18/2022   Procedure: TRANSESOPHAGEAL ECHOCARDIOGRAM;  Surgeon: [name], MD;  Location: [facility] INVASIVE CV LAB;  Service: Cardiovascular;  Laterality: N/A;   Patient Active Problem List   Diagnosis Date Noted   Essential hypertension, benign 08/18/2022   Hyperlipidemia 08/18/2022   DM (diabetes mellitus), type 2 (HCC) 08/18/2022    Obesity (BMI 30-39.9) 08/18/2022   CAD (coronary artery disease) 08/18/2022   OSA (obstructive sleep apnea) 08/18/2022   Acute ischemic left MCA stroke (HCC) 08/18/2022   Acute ischemic right MCA stroke (HCC) 08/14/2022   Hypertensive urgency 04/27/2017   ONSET DATE: 08/14/2022; date of referral 08/23/2022    REFERRING DIAG: F64.332 (ICD-10-CM) - Cerebral infarction due to unspecified occlusion or stenosis of left middle cerebral artery   THERAPY DIAG:  Aphasia   Apraxia   Cerebral infarction due to unspecified occlusion or stenosis of left middle cerebral artery (HCC)   Rationale for Evaluation and Treatment Rehabilitation   SUBJECTIVE:      PERTINENT HISTORY:  [name] is 57 year old male who presented to the [facility] ED on 08/14/2022 noted by wife to be very weak and confused and halfway enter out of the bathtub at home. Symptoms were sudden in onset. Last known well 9 PM. Patient presented with global aphasia. Code stroke activated. CT head negative for hemorrhage, aspects of 9, mild hyperdensity left MCA could indicate thrombus. Tenecteplase administered. CTA showed left M1 occlusion and neurointerventional radiology contacted. Patient transferred to [facility] for thrombectomy. On route he was noted to have periorbital welts and uvular swelling. Was concern of possible angioedema secondary to tenecteplase. He was intubated for airway protection and underwent cerebral angiogram by Dr. Tommie Sams. Recanalization  of the left MCA vascular tree noted. Critical care medicine consulted for management and placed on low-dose Neo-Synephrine infusion. Extubated on 4/23. 2D echo with EF of approximately 60 to 65% with trivial MVR. LDL 96, hemoglobin A1c 6.3%. He was started on heparin subcutaneously for VTE prophylaxis.     DIAGNOSTIC FINDINGS:    CT Head - 08/14/2022 Mild hyperdensity of the left MCA   CT Head and Neck Angio - 08/14/2022 Proximal occlusion of the left MCA M1  segment with poor collateralization throughout the MCA territory.   IR Angio - 08/15/2022 Interval recanalization of the left M1/MCA occlusion seen on prior CT angiogram likely as a result of TNK administration. Therefore, no intervention performed.   MRI 08/15/2022 Acute cortical infarcts along the left frontal operculum and left anterior insula with additional small area of acute cortical infarct within left parietal lobe. Other areas of faint cortical DWI hyperintensity throughout the left MCA territory, including the left putamen, may reflect additional areas of infarct or ischemia. 2. Loss of the left transverse sinus flow void is favored to reflect slow flow. If clinical suspicion for thrombosis, CT venography could be performed for further characterization.     PAIN:  Are you having pain? No   FALLS: Has patient fallen in last 6 months?  No   LIVING ENVIRONMENT: Lives with: lives with their family Lives in: House/apartment   PLOF:  Level of assistance: Independent with ADLs, Independent with IADLs Employment: Full-time employment     PATIENT GOALS    to be able to communicate again   SUBJECTIVE STATEMENT:  Pt appeared anxious, reports having a doctor's appt after this session, session ended early  Pt accompanied by: pt's wife   OBJECTIVE:   TODAY'S TREATMENT:  Skilled treatment session focused on pt's communication goals. SLP facilitated session by providing the following interventions:  Pt required moderate verbal cues to understand orientation information as well as verbal expression answers. Pt independently attempted writing down some answers but the responses weren't always the correct ones. Pt with increased verbal perseveration.    PATIENT EDUCATION: Education details: see above Person educated: Patient, pt's son Education method: Explanation Education comprehension: verbalized understanding and needs further education   HOME EXERCISE PROGRAM:   N/A   GOALS:   Goals reviewed with patient? Yes   SHORT TERM GOALS: Target date: 10 sessions   SEE PREVIOUS NOTES FOR PROGRESS TOWARDS GOALS  UPDATED: 11/23/2022  5.  The patient will follow simple body commands presented auditorily at 80% accuracy given frequent maximal visual cues. Goal status: INITIAL:  MET   6.  With minimal cues, pt will demonstrate improved reading comprehension by following 2 step written directions with 75% accuracy.  Baseline:  Goal status: INITIAL  7.  With minimal cues, pt will type phrase level basic functional statements (3-4 words in length) in 5 out of 7 opportunities.  Baseline:  Goal status: INITIAL  8.  With minimal cues, pt will imitate basic functional phrase level statements with 75% speech intelligibility.  Baseline:  Goal status: INITIAL  LONG TERM GOALS: Target date: 01/26/2023   SEE PREVIOUS NOTES FOR PROGRESS TOWARDS GOALS  UPDATED: 11/23/2022  Pt will use multimodal means of communication to express basic biographical information about himself with moderate assistance.  Baseline:  Goal status: INITIAL Goal status: ONGOING; progress made; MET Updated Goal (11/03/2022): Pt will use multimodal means of communication to express basic biographical information about himself, activities within his day and his family with minimal assistance:  ongoing    2.  Pt will demonstrate > 90% speech intelligibility when reading orientation information.  Baseline:  Goal status: INITIAL Goal status: ONGOING; progress made  3. Pt will answer semi-complex yes/no questions with > 75% accuracy given minimal cues.   Baseline: 75% with simple yes/no questions  Goal status: INITIAL: MET     ASSESSMENT:   CLINICAL IMPRESSION: Pt is a 57 year old right handed male who was seen today for a speech-language treatment d/t moderate transcortical sensory aphasia. Increased difficulty with phonological processing. See treatment note for more details.    OBJECTIVE IMPAIRMENTS include expressive language, receptive language, apraxia, and reading comprehension and ability to produce written language . These impairments are limiting patient from return to work, managing medications, managing appointments, managing finances, household responsibilities, ADLs/IADLs, and effectively communicating at home and in community. Factors affecting potential to achieve goals and functional outcome are severity of impairments. Patient will benefit from skilled SLP services to address above impairments and improve overall function.   REHAB POTENTIAL: Excellent   PLAN: SLP FREQUENCY: 3x/week to 4xweek   SLP DURATION: 12 weeks   PLANNED INTERVENTIONS: Language facilitation, Cueing hierachy, Functional tasks, Multimodal communication approach, SLP instruction and feedback, Compensatory strategies, and Patient/family education       B. Dreama Saa, M.S., CCC-SLP, Tree surgeon Certified Brain Injury Specialist [facility]  [facility] Office [phone] Ascom [phone] Fax [phone]

## 2022-12-06 ENCOUNTER — Ambulatory Visit: Payer: BC Managed Care – PPO | Admitting: Speech Pathology

## 2022-12-06 DIAGNOSIS — R4701 Aphasia: Secondary | ICD-10-CM | POA: Diagnosis not present

## 2022-12-06 DIAGNOSIS — I63512 Cerebral infarction due to unspecified occlusion or stenosis of left middle cerebral artery: Secondary | ICD-10-CM

## 2022-12-06 DIAGNOSIS — R482 Apraxia: Secondary | ICD-10-CM

## 2022-12-06 NOTE — Therapy (Signed)
OUTPATIENT SPEECH LANGUAGE PATHOLOGY  TREATMENT NOTE     Patient Name: [name] MRN: [id number] DOB:09-10-1965, 57 y.o., male Today's Date: 12/06/2022   PCP: Myrene Buddy, NP REFERRING PROVIDER: Mariam Dollar, PA     End of Session - 12/06/22 1245     Visit Number 64    Number of Visits 97    Date for SLP Re-Evaluation 01/26/23    Authorization Type [employer]    Progress Note Due on Visit 70    SLP Start Time 1100    SLP Stop Time  1145    SLP Time Calculation (min) 45 min    Activity Tolerance Patient tolerated treatment well              Past Medical History:  Diagnosis Date   Hypertension    LVH (left ventricular hypertrophy)    Myocardial infarction (HCC)    Vitamin D deficiency    Past Surgical History:  Procedure Laterality Date   ANTERIOR CRUCIATE LIGAMENT REPAIR     COLONOSCOPY WITH PROPOFOL N/A 04/11/2018   Procedure: COLONOSCOPY WITH PROPOFOL;  Surgeon: Toledo, Boykin Nearing, MD;  Location: [facility] ENDOSCOPY;  Service: Gastroenterology;  Laterality: N/A;   GASTRIC RESECTION     IR ANGIO INTRA EXTRACRAN SEL INTERNAL CAROTID UNI L MOD SED  08/15/2022   IR US GUIDE VASC ACCESS RIGHT  08/15/2022   LAPAROTOMY     LOOP RECORDER INSERTION N/A 08/18/2022   Procedure: LOOP RECORDER INSERTION;  Surgeon: Regan Lemming, MD;  Location: [facility] INVASIVE CV LAB;  Service: Cardiovascular;  Laterality: N/A;   RADIOLOGY WITH ANESTHESIA N/A 08/14/2022   Procedure: IR WITH ANESTHESIA;  Surgeon: Julieanne Cotton, MD;  Location: [facility] OR;  Service: Radiology;  Laterality: N/A;   TEE WITHOUT CARDIOVERSION N/A 08/18/2022   Procedure: TRANSESOPHAGEAL ECHOCARDIOGRAM;  Surgeon: [name], MD;  Location: [facility] INVASIVE CV LAB;  Service: Cardiovascular;  Laterality: N/A;   Patient Active Problem List   Diagnosis Date Noted   Essential hypertension, benign 08/18/2022   Hyperlipidemia 08/18/2022   DM (diabetes mellitus), type 2 (HCC) 08/18/2022    Obesity (BMI 30-39.9) 08/18/2022   CAD (coronary artery disease) 08/18/2022   OSA (obstructive sleep apnea) 08/18/2022   Acute ischemic left MCA stroke (HCC) 08/18/2022   Acute ischemic right MCA stroke (HCC) 08/14/2022   Hypertensive urgency 04/27/2017   ONSET DATE: 08/14/2022; date of referral 08/23/2022    REFERRING DIAG: K16.010 (ICD-10-CM) - Cerebral infarction due to unspecified occlusion or stenosis of left middle cerebral artery   THERAPY DIAG:  Aphasia   Apraxia   Cerebral infarction due to unspecified occlusion or stenosis of left middle cerebral artery (HCC)   Rationale for Evaluation and Treatment Rehabilitation   SUBJECTIVE:      PERTINENT HISTORY:  [name] is 57 year old male who presented to the [facility] ED on 08/14/2022 noted by wife to be very weak and confused and halfway enter out of the bathtub at home. Symptoms were sudden in onset. Last known well 9 PM. Patient presented with global aphasia. Code stroke activated. CT head negative for hemorrhage, aspects of 9, mild hyperdensity left MCA could indicate thrombus. Tenecteplase administered. CTA showed left M1 occlusion and neurointerventional radiology contacted. Patient transferred to [facility] for thrombectomy. On route he was noted to have periorbital welts and uvular swelling. Was concern of possible angioedema secondary to tenecteplase. He was intubated for airway protection and underwent cerebral angiogram by Dr. Tommie Sams. Recanalization  of the left MCA vascular tree noted. Critical care medicine consulted for management and placed on low-dose Neo-Synephrine infusion. Extubated on 4/23. 2D echo with EF of approximately 60 to 65% with trivial MVR. LDL 96, hemoglobin A1c 6.3%. He was started on heparin subcutaneously for VTE prophylaxis.     DIAGNOSTIC FINDINGS:    CT Head - 08/14/2022 Mild hyperdensity of the left MCA   CT Head and Neck Angio - 08/14/2022 Proximal occlusion of the left MCA M1  segment with poor collateralization throughout the MCA territory.   IR Angio - 08/15/2022 Interval recanalization of the left M1/MCA occlusion seen on prior CT angiogram likely as a result of TNK administration. Therefore, no intervention performed.   MRI 08/15/2022 Acute cortical infarcts along the left frontal operculum and left anterior insula with additional small area of acute cortical infarct within left parietal lobe. Other areas of faint cortical DWI hyperintensity throughout the left MCA territory, including the left putamen, may reflect additional areas of infarct or ischemia. 2. Loss of the left transverse sinus flow void is favored to reflect slow flow. If clinical suspicion for thrombosis, CT venography could be performed for further characterization.     PAIN:  Are you having pain? No   FALLS: Has patient fallen in last 6 months?  No   LIVING ENVIRONMENT: Lives with: lives with their family Lives in: House/apartment   PLOF:  Level of assistance: Independent with ADLs, Independent with IADLs Employment: Full-time employment     PATIENT GOALS    to be able to communicate again   SUBJECTIVE STATEMENT:  Pt accompanied by his granddaughter and daughter, pt smiling, introducing them both Pt accompanied by: pt's daughter and granddaughter   OBJECTIVE:   TODAY'S TREATMENT:  Skilled treatment session focused on pt's communication goals. SLP facilitated session by providing the following interventions:    To target word finding and sentence creation.  Scientist, product/process development (VNeST) was utilized. The pt generated 3 subjects and objects for 3 verbs, for a total of 6 subject objects. Pt required moderate cues. Pt generated basic sentences by answering "wh" questions when maximal assistance provided.     PATIENT EDUCATION: Education details: see above Person educated: Patient, pt's son Education method: Explanation Education comprehension: verbalized  understanding and needs further education   HOME EXERCISE PROGRAM:  N/A   GOALS:   Goals reviewed with patient? Yes   SHORT TERM GOALS: Target date: 10 sessions   SEE PREVIOUS NOTES FOR PROGRESS TOWARDS GOALS  UPDATED: 11/23/2022  5.  The patient will follow simple body commands presented auditorily at 80% accuracy given frequent maximal visual cues. Goal status: INITIAL:  MET   6.  With minimal cues, pt will demonstrate improved reading comprehension by following 2 step written directions with 75% accuracy.  Baseline:  Goal status: INITIAL  7.  With minimal cues, pt will type phrase level basic functional statements (3-4 words in length) in 5 out of 7 opportunities.  Baseline:  Goal status: INITIAL  8.  With minimal cues, pt will imitate basic functional phrase level statements with 75% speech intelligibility.  Baseline:  Goal status: INITIAL  LONG TERM GOALS: Target date: 01/26/2023   SEE PREVIOUS NOTES FOR PROGRESS TOWARDS GOALS  UPDATED: 11/23/2022  Pt will use multimodal means of communication to express basic biographical information about himself with moderate assistance.  Baseline:  Goal status: INITIAL Goal status: ONGOING; progress made; MET Updated Goal (11/03/2022): Pt will use multimodal means of communication to express  basic biographical information about himself, activities within his day and his family with minimal assistance: ongoing    2.  Pt will demonstrate > 90% speech intelligibility when reading orientation information.  Baseline:  Goal status: INITIAL Goal status: ONGOING; progress made  3. Pt will answer semi-complex yes/no questions with > 75% accuracy given minimal cues.   Baseline: 75% with simple yes/no questions  Goal status: INITIAL: MET     ASSESSMENT:   CLINICAL IMPRESSION: Pt is a 57 year old right handed male who was seen today for a speech-language treatment d/t moderate transcortical sensory aphasia. Increased difficulty with  phonological processing. See treatment note for more details.   OBJECTIVE IMPAIRMENTS include expressive language, receptive language, apraxia, and reading comprehension and ability to produce written language . These impairments are limiting patient from return to work, managing medications, managing appointments, managing finances, household responsibilities, ADLs/IADLs, and effectively communicating at home and in community. Factors affecting potential to achieve goals and functional outcome are severity of impairments. Patient will benefit from skilled SLP services to address above impairments and improve overall function.   REHAB POTENTIAL: Excellent   PLAN: SLP FREQUENCY: 3x/week to 4xweek   SLP DURATION: 12 weeks   PLANNED INTERVENTIONS: Language facilitation, Cueing hierachy, Functional tasks, Multimodal communication approach, SLP instruction and feedback, Compensatory strategies, and Patient/family education       B. Dreama Saa, M.S., CCC-SLP, Tree surgeon Certified Brain Injury Specialist [facility]  [facility] Rehabilitation Services Office [phone] Ascom [phone] Fax [phone]

## 2022-12-07 ENCOUNTER — Ambulatory Visit: Payer: BC Managed Care – PPO | Admitting: Speech Pathology

## 2022-12-07 ENCOUNTER — Telehealth: Payer: Self-pay | Admitting: Neurology

## 2022-12-07 DIAGNOSIS — R4701 Aphasia: Secondary | ICD-10-CM | POA: Diagnosis not present

## 2022-12-07 DIAGNOSIS — I63512 Cerebral infarction due to unspecified occlusion or stenosis of left middle cerebral artery: Secondary | ICD-10-CM

## 2022-12-07 NOTE — Telephone Encounter (Signed)
Disability form has been completed and placed in Dr Marlis Edelson office for him to sign upon his return

## 2022-12-07 NOTE — Therapy (Signed)
OUTPATIENT SPEECH LANGUAGE PATHOLOGY  TREATMENT NOTE     Patient Name: [name] MRN: [id number] DOB:April 10, 1966, 57 y.o., male Today's Date: 12/07/2022   PCP: Myrene Buddy, NP REFERRING PROVIDER: Mariam Dollar, PA     End of Session - 12/07/22 0918     Visit Number 65    Number of Visits 97    Date for SLP Re-Evaluation 01/26/23    Authorization Type [employer]    Progress Note Due on Visit 70    SLP Start Time 0800    SLP Stop Time  0845    SLP Time Calculation (min) 45 min    Activity Tolerance Patient tolerated treatment well              Past Medical History:  Diagnosis Date   Hypertension    LVH (left ventricular hypertrophy)    Myocardial infarction (HCC)    Vitamin D deficiency    Past Surgical History:  Procedure Laterality Date   ANTERIOR CRUCIATE LIGAMENT REPAIR     COLONOSCOPY WITH PROPOFOL N/A 04/11/2018   Procedure: COLONOSCOPY WITH PROPOFOL;  Surgeon: Toledo, Boykin Nearing, MD;  Location: [facility] ENDOSCOPY;  Service: Gastroenterology;  Laterality: N/A;   GASTRIC RESECTION     IR ANGIO INTRA EXTRACRAN SEL INTERNAL CAROTID UNI L MOD SED  08/15/2022   IR US GUIDE VASC ACCESS RIGHT  08/15/2022   LAPAROTOMY     LOOP RECORDER INSERTION N/A 08/18/2022   Procedure: LOOP RECORDER INSERTION;  Surgeon: Regan Lemming, MD;  Location: [facility] INVASIVE CV LAB;  Service: Cardiovascular;  Laterality: N/A;   RADIOLOGY WITH ANESTHESIA N/A 08/14/2022   Procedure: IR WITH ANESTHESIA;  Surgeon: Julieanne Cotton, MD;  Location: [facility] OR;  Service: Radiology;  Laterality: N/A;   TEE WITHOUT CARDIOVERSION N/A 08/18/2022   Procedure: TRANSESOPHAGEAL ECHOCARDIOGRAM;  Surgeon: [name], MD;  Location: [facility] INVASIVE CV LAB;  Service: Cardiovascular;  Laterality: N/A;   Patient Active Problem List   Diagnosis Date Noted   Essential hypertension, benign 08/18/2022   Hyperlipidemia 08/18/2022   DM (diabetes mellitus), type 2 (HCC) 08/18/2022    Obesity (BMI 30-39.9) 08/18/2022   CAD (coronary artery disease) 08/18/2022   OSA (obstructive sleep apnea) 08/18/2022   Acute ischemic left MCA stroke (HCC) 08/18/2022   Acute ischemic right MCA stroke (HCC) 08/14/2022   Hypertensive urgency 04/27/2017   ONSET DATE: 08/14/2022; date of referral 08/23/2022    REFERRING DIAG: X32.440 (ICD-10-CM) - Cerebral infarction due to unspecified occlusion or stenosis of left middle cerebral artery   THERAPY DIAG:  Aphasia   Apraxia   Cerebral infarction due to unspecified occlusion or stenosis of left middle cerebral artery (HCC)   Rationale for Evaluation and Treatment Rehabilitation   SUBJECTIVE:      PERTINENT HISTORY:  [name] is 57 year old male who presented to the [facility] ED on 08/14/2022 noted by wife to be very weak and confused and halfway enter out of the bathtub at home. Symptoms were sudden in onset. Last known well 9 PM. Patient presented with global aphasia. Code stroke activated. CT head negative for hemorrhage, aspects of 9, mild hyperdensity left MCA could indicate thrombus. Tenecteplase administered. CTA showed left M1 occlusion and neurointerventional radiology contacted. Patient transferred to [facility] for thrombectomy. On route he was noted to have periorbital welts and uvular swelling. Was concern of possible angioedema secondary to tenecteplase. He was intubated for airway protection and underwent cerebral angiogram by Dr. Tommie Sams. Recanalization  of the left MCA vascular tree noted. Critical care medicine consulted for management and placed on low-dose Neo-Synephrine infusion. Extubated on 4/23. 2D echo with EF of approximately 60 to 65% with trivial MVR. LDL 96, hemoglobin A1c 6.3%. He was started on heparin subcutaneously for VTE prophylaxis.     DIAGNOSTIC FINDINGS:    CT Head - 08/14/2022 Mild hyperdensity of the left MCA   CT Head and Neck Angio - 08/14/2022 Proximal occlusion of the left MCA M1  segment with poor collateralization throughout the MCA territory.   IR Angio - 08/15/2022 Interval recanalization of the left M1/MCA occlusion seen on prior CT angiogram likely as a result of TNK administration. Therefore, no intervention performed.   MRI 08/15/2022 Acute cortical infarcts along the left frontal operculum and left anterior insula with additional small area of acute cortical infarct within left parietal lobe. Other areas of faint cortical DWI hyperintensity throughout the left MCA territory, including the left putamen, may reflect additional areas of infarct or ischemia. 2. Loss of the left transverse sinus flow void is favored to reflect slow flow. If clinical suspicion for thrombosis, CT venography could be performed for further characterization.     PAIN:  Are you having pain? No   FALLS: Has patient fallen in last 6 months?  No   LIVING ENVIRONMENT: Lives with: lives with their family Lives in: House/apartment   PLOF:  Level of assistance: Independent with ADLs, Independent with IADLs Employment: Full-time employment     PATIENT GOALS    to be able to communicate again   SUBJECTIVE STATEMENT:  Pt accompanied his son, [name] Pt accompanied by: pt's son   OBJECTIVE:   TODAY'S TREATMENT:  Skilled treatment session focused on pt's communication goals. SLP facilitated session by providing the following interventions:  To target word finding and sentence creation, Scientist, product/process development (VNeST) was utilized. The pt generated 3 subjects and objects for 6 verbs, for a total of 18subject objects. Pt required moderate cues. Pt generated basic sentences by answering "wh" questions when maximal assistance provided for speech intelligibility and minimal assistance for appropriate written words.      PATIENT EDUCATION: Education details: see above Person educated: Patient, pt's son Education method: Explanation Education comprehension: verbalized  understanding and needs further education   HOME EXERCISE PROGRAM:  N/A   GOALS:   Goals reviewed with patient? Yes   SHORT TERM GOALS: Target date: 10 sessions   SEE PREVIOUS NOTES FOR PROGRESS TOWARDS GOALS  UPDATED: 11/23/2022  5.  The patient will follow simple body commands presented auditorily at 80% accuracy given frequent maximal visual cues. Goal status: INITIAL:  MET   6.  With minimal cues, pt will demonstrate improved reading comprehension by following 2 step written directions with 75% accuracy.  Baseline:  Goal status: INITIAL  7.  With minimal cues, pt will type phrase level basic functional statements (3-4 words in length) in 5 out of 7 opportunities.  Baseline:  Goal status: INITIAL  8.  With minimal cues, pt will imitate basic functional phrase level statements with 75% speech intelligibility.  Baseline:  Goal status: INITIAL  LONG TERM GOALS: Target date: 01/26/2023   SEE PREVIOUS NOTES FOR PROGRESS TOWARDS GOALS  UPDATED: 11/23/2022  Pt will use multimodal means of communication to express basic biographical information about himself with moderate assistance.  Baseline:  Goal status: INITIAL Goal status: ONGOING; progress made; MET Updated Goal (11/03/2022): Pt will use multimodal means of communication to express basic biographical  information about himself, activities within his day and his family with minimal assistance: ongoing    2.  Pt will demonstrate > 90% speech intelligibility when reading orientation information.  Baseline:  Goal status: INITIAL Goal status: ONGOING; progress made  3. Pt will answer semi-complex yes/no questions with > 75% accuracy given minimal cues.   Baseline: 75% with simple yes/no questions  Goal status: INITIAL: MET     ASSESSMENT:   CLINICAL IMPRESSION: Pt is a 57 year old right handed male who was seen today for a speech-language treatment d/t moderate transcortical sensory aphasia. Pt with increased  perseveration during today's session and benefited from initial phoneme to improve accuracy of motor pattern. See treatment note for more details.   OBJECTIVE IMPAIRMENTS include expressive language, receptive language, apraxia, and reading comprehension and ability to produce written language . These impairments are limiting patient from return to work, managing medications, managing appointments, managing finances, household responsibilities, ADLs/IADLs, and effectively communicating at home and in community. Factors affecting potential to achieve goals and functional outcome are severity of impairments. Patient will benefit from skilled SLP services to address above impairments and improve overall function.   REHAB POTENTIAL: Excellent   PLAN: SLP FREQUENCY: 3x/week to 4xweek   SLP DURATION: 12 weeks   PLANNED INTERVENTIONS: Language facilitation, Cueing hierachy, Functional tasks, Multimodal communication approach, SLP instruction and feedback, Compensatory strategies, and Patient/family education       B. Dreama Saa, M.S., CCC-SLP, Tree surgeon Certified Brain Injury Specialist [facility]  [facility] Rehabilitation Services Office [phone] Ascom [phone] Fax [phone]

## 2022-12-08 ENCOUNTER — Ambulatory Visit: Payer: BC Managed Care – PPO | Admitting: Speech Pathology

## 2022-12-08 DIAGNOSIS — R4701 Aphasia: Secondary | ICD-10-CM

## 2022-12-08 DIAGNOSIS — I63512 Cerebral infarction due to unspecified occlusion or stenosis of left middle cerebral artery: Secondary | ICD-10-CM

## 2022-12-08 NOTE — Therapy (Signed)
OUTPATIENT SPEECH LANGUAGE PATHOLOGY  TREATMENT NOTE     Patient Name: [name] MRN: [id number] DOB:03-11-1966, 57 y.o., male Today's Date: 12/08/2022   PCP: Myrene Buddy, NP REFERRING PROVIDER: Mariam Dollar, PA     End of Session - 12/08/22 0848     Visit Number 66    Number of Visits 97    Date for SLP Re-Evaluation 01/26/23    Authorization Type [employer]    Progress Note Due on Visit 70    SLP Start Time 0845    SLP Stop Time  0930    SLP Time Calculation (min) 45 min    Activity Tolerance Patient tolerated treatment well              Past Medical History:  Diagnosis Date   Hypertension    LVH (left ventricular hypertrophy)    Myocardial infarction (HCC)    Vitamin D deficiency    Past Surgical History:  Procedure Laterality Date   ANTERIOR CRUCIATE LIGAMENT REPAIR     COLONOSCOPY WITH PROPOFOL N/A 04/11/2018   Procedure: COLONOSCOPY WITH PROPOFOL;  Surgeon: Toledo, Boykin Nearing, MD;  Location: [facility] ENDOSCOPY;  Service: Gastroenterology;  Laterality: N/A;   GASTRIC RESECTION     IR ANGIO INTRA EXTRACRAN SEL INTERNAL CAROTID UNI L MOD SED  08/15/2022   IR US GUIDE VASC ACCESS RIGHT  08/15/2022   LAPAROTOMY     LOOP RECORDER INSERTION N/A 08/18/2022   Procedure: LOOP RECORDER INSERTION;  Surgeon: Regan Lemming, MD;  [facility]: [facility] INVASIVE CV LAB;  Service: Cardiovascular;  Laterality: N/A;   RADIOLOGY WITH ANESTHESIA N/A 08/14/2022   Procedure: IR WITH ANESTHESIA;  Surgeon: Julieanne Cotton, MD;  Location: [facility] OR;  Service: Radiology;  Laterality: N/A;   TEE WITHOUT CARDIOVERSION N/A 08/18/2022   Procedure: TRANSESOPHAGEAL ECHOCARDIOGRAM;  Surgeon: [name], MD;  Location: [facility] INVASIVE CV LAB;  Service: Cardiovascular;  Laterality: N/A;   Patient Active Problem List   Diagnosis Date Noted   Essential hypertension, benign 08/18/2022   Hyperlipidemia 08/18/2022   DM (diabetes mellitus), type 2 (HCC) 08/18/2022    Obesity (BMI 30-39.9) 08/18/2022   CAD (coronary artery disease) 08/18/2022   OSA (obstructive sleep apnea) 08/18/2022   Acute ischemic left MCA stroke (HCC) 08/18/2022   Acute ischemic right MCA stroke (HCC) 08/14/2022   Hypertensive urgency 04/27/2017   ONSET DATE: 08/14/2022; date of referral 08/23/2022    REFERRING DIAG: J19.147 (ICD-10-CM) - Cerebral infarction due to unspecified occlusion or stenosis of left middle cerebral artery   THERAPY DIAG:  Aphasia   Apraxia   Cerebral infarction due to unspecified occlusion or stenosis of left middle cerebral artery (HCC)   Rationale for Evaluation and Treatment Rehabilitation   SUBJECTIVE:      PERTINENT HISTORY:  [name] is 57 year old male who presented to the [facility] ED on 08/14/2022 noted by wife to be very weak and confused and halfway enter out of the bathtub at home. Symptoms were sudden in onset. Last known well 9 PM. Patient presented with global aphasia. Code stroke activated. CT head negative for hemorrhage, aspects of 9, mild hyperdensity left MCA could indicate thrombus. Tenecteplase administered. CTA showed left M1 occlusion and neurointerventional radiology contacted. Patient transferred to [facility] for thrombectomy. On route he was noted to have periorbital welts and uvular swelling. Was concern of possible angioedema secondary to tenecteplase. He was intubated for airway protection and underwent cerebral angiogram by Dr. Tommie Sams. Recanalization  of the left MCA vascular tree noted. Critical care medicine consulted for management and placed on low-dose Neo-Synephrine infusion. Extubated on 4/23. 2D echo with EF of approximately 60 to 65% with trivial MVR. LDL 96, hemoglobin A1c 6.3%. He was started on heparin subcutaneously for VTE prophylaxis.     DIAGNOSTIC FINDINGS:    CT Head - 08/14/2022 Mild hyperdensity of the left MCA   CT Head and Neck Angio - 08/14/2022 Proximal occlusion of the left MCA M1  segment with poor collateralization throughout the MCA territory.   IR Angio - 08/15/2022 Interval recanalization of the left M1/MCA occlusion seen on prior CT angiogram likely as a result of TNK administration. Therefore, no intervention performed.   MRI 08/15/2022 Acute cortical infarcts along the left frontal operculum and left anterior insula with additional small area of acute cortical infarct within left parietal lobe. Other areas of faint cortical DWI hyperintensity throughout the left MCA territory, including the left putamen, may reflect additional areas of infarct or ischemia. 2. Loss of the left transverse sinus flow void is favored to reflect slow flow. If clinical suspicion for thrombosis, CT venography could be performed for further characterization.     PAIN:  Are you having pain? No   FALLS: Has patient fallen in last 6 months?  No   LIVING ENVIRONMENT: Lives with: lives with their family Lives in: House/apartment   PLOF:  Level of assistance: Independent with ADLs, Independent with IADLs Employment: Full-time employment     PATIENT GOALS    to be able to communicate again   SUBJECTIVE STATEMENT:  Pt accompanied his son, [name] Pt accompanied by: pt's son   OBJECTIVE:   TODAY'S TREATMENT:  Skilled treatment session focused on pt's communication goals. SLP facilitated session by providing the following interventions:  To target word finding and sentence creation, Scientist, product/process development ([employer]) was utilized. The pt generated 3 subjects and objects for 6 verbs, for a total of 18 subject objects. Pt required minimal cues when presented with picture cards. Pt generated basic sentences by answering "wh" questions when moderate assistance provided for speech intelligibility to include all words and syllables and minimal assistance for appropriate written words.  In addition, pt generated basic sentences when disucssing upcoming presidential candidate  [name]. Pt with moderate assistance to generate noun - verb agreement.     PATIENT EDUCATION: Education details: see above Person educated: Patient, pt's son Education method: Explanation Education comprehension: verbalized understanding and needs further education   HOME EXERCISE PROGRAM:  N/A   GOALS:   Goals reviewed with patient? Yes   SHORT TERM GOALS: Target date: 10 sessions   SEE PREVIOUS NOTES FOR PROGRESS TOWARDS GOALS  UPDATED: 11/23/2022  5.  The patient will follow simple body commands presented auditorily at 80% accuracy given frequent maximal visual cues. Goal status: INITIAL:  MET   6.  With minimal cues, pt will demonstrate improved reading comprehension by following 2 step written directions with 75% accuracy.  Baseline:  Goal status: INITIAL  7.  With minimal cues, pt will type phrase level basic functional statements (3-4 words in length) in 5 out of 7 opportunities.  Baseline:  Goal status: INITIAL  8.  With minimal cues, pt will imitate basic functional phrase level statements with 75% speech intelligibility.  Baseline:  Goal status: INITIAL  LONG TERM GOALS: Target date: 01/26/2023   SEE PREVIOUS NOTES FOR PROGRESS TOWARDS GOALS  UPDATED: 11/23/2022  Pt will use multimodal means of communication to  express basic biographical information about himself with moderate assistance.  Baseline:  Goal status: INITIAL Goal status: ONGOING; progress made; MET Updated Goal (11/03/2022): Pt will use multimodal means of communication to express basic biographical information about himself, activities within his day and his family with minimal assistance: ongoing    2.  Pt will demonstrate > 90% speech intelligibility when reading orientation information.  Baseline:  Goal status: INITIAL Goal status: ONGOING; progress made  3. Pt will answer semi-complex yes/no questions with > 75% accuracy given minimal cues.   Baseline: 75% with simple yes/no  questions  Goal status: INITIAL: MET     ASSESSMENT:   CLINICAL IMPRESSION: Pt is a 57 year old right handed male who was seen today for a speech-language treatment d/t moderate transcortical sensory aphasia. Pt proud of the sentences that he generated today as evidenced by sending them to his wife in a text! See treatment note for more details.   OBJECTIVE IMPAIRMENTS include expressive language, receptive language, apraxia, and reading comprehension and ability to produce written language . These impairments are limiting patient from return to work, managing medications, managing appointments, managing finances, household responsibilities, ADLs/IADLs, and effectively communicating at home and in community. Factors affecting potential to achieve goals and functional outcome are severity of impairments. Patient will benefit from skilled SLP services to address above impairments and improve overall function.   REHAB POTENTIAL: Excellent   PLAN: SLP FREQUENCY: 3x/week to 4xweek   SLP DURATION: 12 weeks   PLANNED INTERVENTIONS: Language facilitation, Cueing hierachy, Functional tasks, Multimodal communication approach, SLP instruction and feedback, Compensatory strategies, and Patient/family education       B. Dreama Saa, M.S., CCC-SLP, Tree surgeon Certified Brain Injury Specialist [facility]  [facility] Office [phone] Ascom [phone] Fax [phone]

## 2022-12-12 ENCOUNTER — Ambulatory Visit: Payer: BC Managed Care – PPO | Admitting: Speech Pathology

## 2022-12-13 ENCOUNTER — Ambulatory Visit: Payer: BC Managed Care – PPO | Admitting: Speech Pathology

## 2022-12-13 DIAGNOSIS — R482 Apraxia: Secondary | ICD-10-CM

## 2022-12-13 DIAGNOSIS — I63512 Cerebral infarction due to unspecified occlusion or stenosis of left middle cerebral artery: Secondary | ICD-10-CM

## 2022-12-13 DIAGNOSIS — R4701 Aphasia: Secondary | ICD-10-CM

## 2022-12-13 NOTE — Telephone Encounter (Signed)
Forms are signed by MD and will place in medical records to be taken care of.

## 2022-12-13 NOTE — Therapy (Signed)
OUTPATIENT SPEECH LANGUAGE PATHOLOGY  TREATMENT NOTE     Patient Name: [name] MRN: [id number] DOB:1965-11-19, 57 y.o., male Today's Date: 12/13/2022   PCP: Myrene Buddy, NP REFERRING PROVIDER: Mariam Dollar, PA     End of Session - 12/13/22 0931     Visit Number 67    Number of Visits 97    Date for SLP Re-Evaluation 01/26/23    Authorization Type [employer]    Progress Note Due on Visit 70    SLP Start Time 0845    SLP Stop Time  0930    SLP Time Calculation (min) 45 min    Activity Tolerance Patient tolerated treatment well              Past Medical History:  Diagnosis Date   Hypertension    LVH (left ventricular hypertrophy)    Myocardial infarction (HCC)    Vitamin D deficiency    Past Surgical History:  Procedure Laterality Date   ANTERIOR CRUCIATE LIGAMENT REPAIR     COLONOSCOPY WITH PROPOFOL N/A 04/11/2018   Procedure: COLONOSCOPY WITH PROPOFOL;  Surgeon: Toledo, Boykin Nearing, MD;  Location: [facility] ENDOSCOPY;  Service: Gastroenterology;  Laterality: N/A;   GASTRIC RESECTION     IR ANGIO INTRA EXTRACRAN SEL INTERNAL CAROTID UNI L MOD SED  08/15/2022   IR US GUIDE VASC ACCESS RIGHT  08/15/2022   LAPAROTOMY     LOOP RECORDER INSERTION N/A 08/18/2022   Procedure: LOOP RECORDER INSERTION;  Surgeon: Regan Lemming, MD;  Location: [facility] INVASIVE CV LAB;  Service: Cardiovascular;  Laterality: N/A;   RADIOLOGY WITH ANESTHESIA N/A 08/14/2022   Procedure: IR WITH ANESTHESIA;  Surgeon: Julieanne Cotton, MD;  Location: [facility] OR;  Service: Radiology;  Laterality: N/A;   TEE WITHOUT CARDIOVERSION N/A 08/18/2022   Procedure: TRANSESOPHAGEAL ECHOCARDIOGRAM;  Surgeon: [name], MD;  Location: [facility] INVASIVE CV LAB;  Service: Cardiovascular;  Laterality: N/A;   Patient Active Problem List   Diagnosis Date Noted   Essential hypertension, benign 08/18/2022   Hyperlipidemia 08/18/2022   DM (diabetes mellitus), type 2 (HCC) 08/18/2022    Obesity (BMI 30-39.9) 08/18/2022   CAD (coronary artery disease) 08/18/2022   OSA (obstructive sleep apnea) 08/18/2022   Acute ischemic left MCA stroke (HCC) 08/18/2022   Acute ischemic right MCA stroke (HCC) 08/14/2022   Hypertensive urgency 04/27/2017   ONSET DATE: 08/14/2022; date of referral 08/23/2022    REFERRING DIAG: W09.811 (ICD-10-CM) - Cerebral infarction due to unspecified occlusion or stenosis of left middle cerebral artery   THERAPY DIAG:  Aphasia   Apraxia   Cerebral infarction due to unspecified occlusion or stenosis of left middle cerebral artery (HCC)   Rationale for Evaluation and Treatment Rehabilitation   SUBJECTIVE:      PERTINENT HISTORY:  [name] is 57 year old male who presented to the [facility] ED on 08/14/2022 noted by wife to be very weak and confused and halfway enter out of the bathtub at home. Symptoms were sudden in onset. Last known well 9 PM. Patient presented with global aphasia. Code stroke activated. CT head negative for hemorrhage, aspects of 9, mild hyperdensity left MCA could indicate thrombus. Tenecteplase administered. CTA showed left M1 occlusion and neurointerventional radiology contacted. Patient transferred to [facility] for thrombectomy. On route he was noted to have periorbital welts and uvular swelling. Was concern of possible angioedema secondary to tenecteplase. He was intubated for airway protection and underwent cerebral angiogram by Dr. Tommie Sams. Recanalization  of the left MCA vascular tree noted. Critical care medicine consulted for management and placed on low-dose Neo-Synephrine infusion. Extubated on 4/23. 2D echo with EF of approximately 60 to 65% with trivial MVR. LDL 96, hemoglobin A1c 6.3%. He was started on heparin subcutaneously for VTE prophylaxis.     DIAGNOSTIC FINDINGS:    CT Head - 08/14/2022 Mild hyperdensity of the left MCA   CT Head and Neck Angio - 08/14/2022 Proximal occlusion of the left MCA M1  segment with poor collateralization throughout the MCA territory.   IR Angio - 08/15/2022 Interval recanalization of the left M1/MCA occlusion seen on prior CT angiogram likely as a result of TNK administration. Therefore, no intervention performed.   MRI 08/15/2022 Acute cortical infarcts along the left frontal operculum and left anterior insula with additional small area of acute cortical infarct within left parietal lobe. Other areas of faint cortical DWI hyperintensity throughout the left MCA territory, including the left putamen, may reflect additional areas of infarct or ischemia. 2. Loss of the left transverse sinus flow void is favored to reflect slow flow. If clinical suspicion for thrombosis, CT venography could be performed for further characterization.     PAIN:  Are you having pain? No   FALLS: Has patient fallen in last 6 months?  No   LIVING ENVIRONMENT: Lives with: lives with their family Lives in: House/apartment   PLOF:  Level of assistance: Independent with ADLs, Independent with IADLs Employment: Full-time employment     PATIENT GOALS    to be able to communicate again   SUBJECTIVE STATEMENT:  Pt accompanied his son, [name] Pt accompanied by: pt's son   OBJECTIVE:   TODAY'S TREATMENT:  Skilled treatment session focused on pt's communication goals. SLP facilitated session by providing the following interventions:  Moderate to minimal assistance required to complete [id number] of [facility] 9: VERBAL AND VISUAL REASONING; VERBAL REASONING-CONVERGENT REASONING d/t reading comprehension and spelling, specifically pt benefited from SLP reading the information and spelling some of the words    PATIENT EDUCATION: Education details: see above Person educated: Patient, pt's son Education method: Explanation Education comprehension: verbalized understanding and needs further education   HOME EXERCISE PROGRAM:  N/A   GOALS:   Goals reviewed with patient?  Yes   SHORT TERM GOALS: Target date: 10 sessions   SEE PREVIOUS NOTES FOR PROGRESS TOWARDS GOALS  UPDATED: 11/23/2022  5.  The patient will follow simple body commands presented auditorily at 80% accuracy given frequent maximal visual cues. Goal status: INITIAL:  MET   6.  With minimal cues, pt will demonstrate improved reading comprehension by following 2 step written directions with 75% accuracy.  Baseline:  Goal status: INITIAL  7.  With minimal cues, pt will type phrase level basic functional statements (3-4 words in length) in 5 out of 7 opportunities.  Baseline:  Goal status: INITIAL  8.  With minimal cues, pt will imitate basic functional phrase level statements with 75% speech intelligibility.  Baseline:  Goal status: INITIAL  LONG TERM GOALS: Target date: 01/26/2023   SEE PREVIOUS NOTES FOR PROGRESS TOWARDS GOALS  UPDATED: 11/23/2022  Pt will use multimodal means of communication to express basic biographical information about himself with moderate assistance.  Baseline:  Goal status: INITIAL Goal status: ONGOING; progress made; MET Updated Goal (11/03/2022): Pt will use multimodal means of communication to express basic biographical information about himself, activities within his day and his family with minimal assistance: ongoing    2.  Pt will  demonstrate > 90% speech intelligibility when reading orientation information.  Baseline:  Goal status: INITIAL Goal status: ONGOING; progress made  3. Pt will answer semi-complex yes/no questions with > 75% accuracy given minimal cues.   Baseline: 75% with simple yes/no questions  Goal status: INITIAL: MET     ASSESSMENT:   CLINICAL IMPRESSION: Pt is a 57 year old right handed male who was seen today for a speech-language treatment d/t moderate transcortical sensory aphasia. Pt continues to exhibit new verbal utterances with increased speech intelligibility. See treatment note for more details.   OBJECTIVE IMPAIRMENTS  include expressive language, receptive language, apraxia, and reading comprehension and ability to produce written language . These impairments are limiting patient from return to work, managing medications, managing appointments, managing finances, household responsibilities, ADLs/IADLs, and effectively communicating at home and in community. Factors affecting potential to achieve goals and functional outcome are severity of impairments. Patient will benefit from skilled SLP services to address above impairments and improve overall function.   REHAB POTENTIAL: Excellent   PLAN: SLP FREQUENCY: 3x/week to 4xweek   SLP DURATION: 12 weeks   PLANNED INTERVENTIONS: Language facilitation, Cueing hierachy, Functional tasks, Multimodal communication approach, SLP instruction and feedback, Compensatory strategies, and Patient/family education      Garyson Stelly B. Dreama Saa, M.S., CCC-SLP, Tree surgeon Certified Brain Injury Specialist [facility]  [facility] Office [phone] Ascom [phone] Fax [phone]

## 2022-12-14 ENCOUNTER — Ambulatory Visit: Payer: BC Managed Care – PPO | Admitting: Speech Pathology

## 2022-12-14 DIAGNOSIS — R482 Apraxia: Secondary | ICD-10-CM

## 2022-12-14 DIAGNOSIS — R4701 Aphasia: Secondary | ICD-10-CM | POA: Diagnosis not present

## 2022-12-14 DIAGNOSIS — I63512 Cerebral infarction due to unspecified occlusion or stenosis of left middle cerebral artery: Secondary | ICD-10-CM

## 2022-12-14 NOTE — Therapy (Signed)
OUTPATIENT SPEECH LANGUAGE PATHOLOGY  TREATMENT NOTE     Patient Name: [name] MRN: [id number] DOB:[birth year], 57 y.o., male Today's Date: 12/14/2022   PCP: Myrene Buddy, NP REFERRING PROVIDER: Mariam Dollar, PA     End of Session - 12/14/22 0905     Visit Number 68    Number of Visits 97    Date for SLP Re-Evaluation 01/26/23    Authorization Type [employer]    Progress Note Due on Visit 70    SLP Start Time 0845    SLP Stop Time  0930    SLP Time Calculation (min) 45 min    Activity Tolerance Patient tolerated treatment well              Past Medical History:  Diagnosis Date   Hypertension    LVH (left ventricular hypertrophy)    Myocardial infarction (HCC)    Vitamin D deficiency    Past Surgical History:  Procedure Laterality Date   ANTERIOR CRUCIATE LIGAMENT REPAIR     COLONOSCOPY WITH PROPOFOL N/A 04/11/2018   Procedure: COLONOSCOPY WITH PROPOFOL;  Surgeon: Toledo, Boykin Nearing, MD;  Location: [facility] ENDOSCOPY;  Service: Gastroenterology;  Laterality: N/A;   GASTRIC RESECTION     IR ANGIO INTRA EXTRACRAN SEL INTERNAL CAROTID UNI L MOD SED  08/15/2022   IR US GUIDE VASC ACCESS RIGHT  08/15/2022   LAPAROTOMY     LOOP RECORDER INSERTION N/A 08/18/2022   Procedure: LOOP RECORDER INSERTION;  Surgeon: Regan Lemming, MD;  Location: [facility] INVASIVE CV LAB;  Service: Cardiovascular;  Laterality: N/A;   RADIOLOGY WITH ANESTHESIA N/A 08/14/2022   Procedure: IR WITH ANESTHESIA;  Surgeon: Julieanne Cotton, MD;  Location: [facility] OR;  Service: Radiology;  Laterality: N/A;   TEE WITHOUT CARDIOVERSION N/A 08/18/2022   Procedure: TRANSESOPHAGEAL ECHOCARDIOGRAM;  Surgeon: [name], MD;  Location: [facility] INVASIVE CV LAB;  Service: Cardiovascular;  Laterality: N/A;   Patient Active Problem List   Diagnosis Date Noted   Essential hypertension, benign 08/18/2022   Hyperlipidemia 08/18/2022   DM (diabetes mellitus), type 2 (HCC) 08/18/2022    Obesity (BMI 30-39.9) 08/18/2022   CAD (coronary artery disease) 08/18/2022   OSA (obstructive sleep apnea) 08/18/2022   Acute ischemic left MCA stroke (HCC) 08/18/2022   Acute ischemic right MCA stroke (HCC) 08/14/2022   Hypertensive urgency 04/27/2017   ONSET DATE: 08/14/2022; date of referral 08/23/2022    REFERRING DIAG: W09.811 (ICD-10-CM) - Cerebral infarction due to unspecified occlusion or stenosis of left middle cerebral artery   THERAPY DIAG:  Aphasia   Apraxia   Cerebral infarction due to unspecified occlusion or stenosis of left middle cerebral artery (HCC)   Rationale for Evaluation and Treatment Rehabilitation   SUBJECTIVE:      PERTINENT HISTORY:  Beth Bedsworth is 57 year old male who presented to the [facility] ED on 08/14/2022 noted by wife to be very weak and confused and halfway enter out of the bathtub at home. Symptoms were sudden in onset. Last known well 9 PM. Patient presented with global aphasia. Code stroke activated. CT head negative for hemorrhage, aspects of 9, mild hyperdensity left MCA could indicate thrombus. Tenecteplase administered. CTA showed left M1 occlusion and neurointerventional radiology contacted. Patient transferred to [facility] for thrombectomy. On route he was noted to have periorbital welts and uvular swelling. Was concern of possible angioedema secondary to tenecteplase. He was intubated for airway protection and underwent cerebral angiogram by Dr. Tommie Sams. Recanalization  of the left MCA vascular tree noted. Critical care medicine consulted for management and placed on low-dose Neo-Synephrine infusion. Extubated on 4/23. 2D echo with EF of approximately 60 to 65% with trivial MVR. LDL 96, hemoglobin A1c 6.3%. He was started on heparin subcutaneously for VTE prophylaxis.     DIAGNOSTIC FINDINGS:    CT Head - 08/14/2022 Mild hyperdensity of the left MCA   CT Head and Neck Angio - 08/14/2022 Proximal occlusion of the left MCA M1  segment with poor collateralization throughout the MCA territory.   IR Angio - 08/15/2022 Interval recanalization of the left M1/MCA occlusion seen on prior CT angiogram likely as a result of TNK administration. Therefore, no intervention performed.   MRI 08/15/2022 Acute cortical infarcts along the left frontal operculum and left anterior insula with additional small area of acute cortical infarct within left parietal lobe. Other areas of faint cortical DWI hyperintensity throughout the left MCA territory, including the left putamen, may reflect additional areas of infarct or ischemia. 2. Loss of the left transverse sinus flow void is favored to reflect slow flow. If clinical suspicion for thrombosis, CT venography could be performed for further characterization.     PAIN:  Are you having pain? No   FALLS: Has patient fallen in last 6 months?  No   LIVING ENVIRONMENT: Lives with: lives with their family Lives in: House/apartment   PLOF:  Level of assistance: Independent with ADLs, Independent with IADLs Employment: Full-time employment     PATIENT GOALS    to be able to communicate again   SUBJECTIVE STATEMENT:  Pt arrived to session by himself Pt accompanied by: alone   OBJECTIVE:   TODAY'S TREATMENT:  Skilled treatment session focused on pt's communication goals. SLP facilitated session by providing the following interventions:  Pt arrived to session by himself indicating that he drove himself to therapy. SLP facilitated by printing pt an identification card providing emergency contact inform and description of aphasia.   SLP further facilitated session by providing rare Min A to complete WALC 9: VERBAL AND VISUAL REASONING, VERBAL REASONING - CONVERGENT REASONING p 105 - pt used Text to Speech for > 95% speech intelligibility at the word level.     PATIENT EDUCATION: Education details: see above Person educated: Patient, pt's wive Education method:  Explanation Education comprehension: verbalized understanding and needs further education   HOME EXERCISE PROGRAM:  Convergent language tasks   GOALS:   Goals reviewed with patient? Yes   SHORT TERM GOALS: Target date: 10 sessions   SEE PREVIOUS NOTES FOR PROGRESS TOWARDS GOALS  UPDATED: 11/23/2022  5.  The patient will follow simple body commands presented auditorily at 80% accuracy given frequent maximal visual cues. Goal status: INITIAL:  MET   6.  With minimal cues, pt will demonstrate improved reading comprehension by following 2 step written directions with 75% accuracy.  Baseline:  Goal status: INITIAL  7.  With minimal cues, pt will type phrase level basic functional statements (3-4 words in length) in 5 out of 7 opportunities.  Baseline:  Goal status: INITIAL  8.  With minimal cues, pt will imitate basic functional phrase level statements with 75% speech intelligibility.  Baseline:  Goal status: INITIAL  LONG TERM GOALS: Target date: 01/26/2023   SEE PREVIOUS NOTES FOR PROGRESS TOWARDS GOALS  UPDATED: 11/23/2022  Pt will use multimodal means of communication to express basic biographical information about himself with moderate assistance.  Baseline:  Goal status: INITIAL Goal status: ONGOING; progress made;  MET Updated Goal (11/03/2022): Pt will use multimodal means of communication to express basic biographical information about himself, activities within his day and his family with minimal assistance: ongoing    2.  Pt will demonstrate > 90% speech intelligibility when reading orientation information.  Baseline:  Goal status: INITIAL Goal status: ONGOING; progress made  3. Pt will answer semi-complex yes/no questions with > 75% accuracy given minimal cues.   Baseline: 75% with simple yes/no questions  Goal status: INITIAL: MET     ASSESSMENT:   CLINICAL IMPRESSION: Pt is a 57 year old right handed male who was seen today for a speech-language treatment  d/t moderate transcortical sensory aphasia. Pt eager to participate in activities with independent use of Text to Speech app to improve communication. See treatment note for more details.   OBJECTIVE IMPAIRMENTS include expressive language, receptive language, apraxia, and reading comprehension and ability to produce written language . These impairments are limiting patient from return to work, managing medications, managing appointments, managing finances, household responsibilities, ADLs/IADLs, and effectively communicating at home and in community. Factors affecting potential to achieve goals and functional outcome are severity of impairments. Patient will benefit from skilled SLP services to address above impairments and improve overall function.   REHAB POTENTIAL: Excellent   PLAN: SLP FREQUENCY: 3x/week to 4xweek   SLP DURATION: 12 weeks   PLANNED INTERVENTIONS: Language facilitation, Cueing hierachy, Functional tasks, Multimodal communication approach, SLP instruction and feedback, Compensatory strategies, and Patient/family education      Oneill Bais B. Dreama Saa, M.S., CCC-SLP, Tree surgeon Certified Brain Injury Specialist [facility]  [facility] Rehabilitation Services Office [phone] Ascom [phone] Fax [phone]

## 2022-12-15 ENCOUNTER — Ambulatory Visit: Payer: BC Managed Care – PPO | Admitting: Speech Pathology

## 2022-12-15 DIAGNOSIS — R4701 Aphasia: Secondary | ICD-10-CM

## 2022-12-15 DIAGNOSIS — R482 Apraxia: Secondary | ICD-10-CM

## 2022-12-15 DIAGNOSIS — I63512 Cerebral infarction due to unspecified occlusion or stenosis of left middle cerebral artery: Secondary | ICD-10-CM

## 2022-12-15 NOTE — Therapy (Signed)
OUTPATIENT SPEECH LANGUAGE PATHOLOGY  TREATMENT NOTE     Patient Name: [name] MRN: [id number] DOB:1965-09-13, 57 y.o., male Today's Date: 12/15/2022   PCP: Myrene Buddy, NP REFERRING PROVIDER: Mariam Dollar, PA     End of Session - 12/15/22 0905     Visit Number 69    Number of Visits 97    Date for SLP Re-Evaluation 01/26/23    Authorization Type [employer]    Progress Note Due on Visit 70    SLP Start Time 0845    SLP Stop Time  0930    SLP Time Calculation (min) 45 min    Activity Tolerance Patient tolerated treatment well              Past Medical History:  Diagnosis Date   Hypertension    LVH (left ventricular hypertrophy)    Myocardial infarction (HCC)    Vitamin D deficiency    Past Surgical History:  Procedure Laterality Date   ANTERIOR CRUCIATE LIGAMENT REPAIR     COLONOSCOPY WITH PROPOFOL N/A 04/11/2018   Procedure: COLONOSCOPY WITH PROPOFOL;  Surgeon: Toledo, Boykin Nearing, MD;  Location: [facility] ENDOSCOPY;  Service: Gastroenterology;  Laterality: N/A;   GASTRIC RESECTION     IR ANGIO INTRA EXTRACRAN SEL INTERNAL CAROTID UNI L MOD SED  08/15/2022   IR US GUIDE VASC ACCESS RIGHT  08/15/2022   LAPAROTOMY     LOOP RECORDER INSERTION N/A 08/18/2022   Procedure: LOOP RECORDER INSERTION;  Surgeon: Regan Lemming, MD;  Location: [facility] INVASIVE CV LAB;  Service: Cardiovascular;  Laterality: N/A;   RADIOLOGY WITH ANESTHESIA N/A 08/14/2022   Procedure: IR WITH ANESTHESIA;  Surgeon: Julieanne Cotton, MD;  Location: [facility] OR;  Service: Radiology;  Laterality: N/A;   TEE WITHOUT CARDIOVERSION N/A 08/18/2022   Procedure: TRANSESOPHAGEAL ECHOCARDIOGRAM;  Surgeon: [name], MD;  Location: [facility] INVASIVE CV LAB;  Service: Cardiovascular;  Laterality: N/A;   Patient Active Problem List   Diagnosis Date Noted   Essential hypertension, benign 08/18/2022   Hyperlipidemia 08/18/2022   DM (diabetes mellitus), type 2 (HCC) 08/18/2022    Obesity (BMI 30-39.9) 08/18/2022   CAD (coronary artery disease) 08/18/2022   OSA (obstructive sleep apnea) 08/18/2022   Acute ischemic left MCA stroke (HCC) 08/18/2022   Acute ischemic right MCA stroke (HCC) 08/14/2022   Hypertensive urgency 04/27/2017   ONSET DATE: 08/14/2022; date of referral 08/23/2022    REFERRING DIAG: G95.621 (ICD-10-CM) - Cerebral infarction due to unspecified occlusion or stenosis of left middle cerebral artery   THERAPY DIAG:  Aphasia   Apraxia   Cerebral infarction due to unspecified occlusion or stenosis of left middle cerebral artery (HCC)   Rationale for Evaluation and Treatment Rehabilitation   SUBJECTIVE:      PERTINENT HISTORY:  [name] is 57 year old male who presented to the [facility] ED on 08/14/2022 noted by wife to be very weak and confused and halfway enter out of the bathtub at home. Symptoms were sudden in onset. Last known well 9 PM. Patient presented with global aphasia. Code stroke activated. CT head negative for hemorrhage, aspects of 9, mild hyperdensity left MCA could indicate thrombus. Tenecteplase administered. CTA showed left M1 occlusion and neurointerventional radiology contacted. Patient transferred to [facility] for thrombectomy. On route he was noted to have periorbital welts and uvular swelling. Was concern of possible angioedema secondary to tenecteplase. He was intubated for airway protection and underwent cerebral angiogram by Dr. Tommie Sams. Recanalization  of the left MCA vascular tree noted. Critical care medicine consulted for management and placed on low-dose Neo-Synephrine infusion. Extubated on 4/23. 2D echo with EF of approximately 60 to 65% with trivial MVR. LDL 96, hemoglobin A1c 6.3%. He was started on heparin subcutaneously for VTE prophylaxis.     DIAGNOSTIC FINDINGS:    CT Head - 08/14/2022 Mild hyperdensity of the left MCA   CT Head and Neck Angio - 08/14/2022 Proximal occlusion of the left MCA M1  segment with poor collateralization throughout the MCA territory.   IR Angio - 08/15/2022 Interval recanalization of the left M1/MCA occlusion seen on prior CT angiogram likely as a result of TNK administration. Therefore, no intervention performed.   MRI 08/15/2022 Acute cortical infarcts along the left frontal operculum and left anterior insula with additional small area of acute cortical infarct within left parietal lobe. Other areas of faint cortical DWI hyperintensity throughout the left MCA territory, including the left putamen, may reflect additional areas of infarct or ischemia. 2. Loss of the left transverse sinus flow void is favored to reflect slow flow. If clinical suspicion for thrombosis, CT venography could be performed for further characterization.     PAIN:  Are you having pain? No   FALLS: Has patient fallen in last 6 months?  No   LIVING ENVIRONMENT: Lives with: lives with their family Lives in: House/apartment   PLOF:  Level of assistance: Independent with ADLs, Independent with IADLs Employment: Full-time employment     PATIENT GOALS    to be able to communicate again   SUBJECTIVE STATEMENT: Pt arrived to session by himself, provided pt with more "I have aphasia" information cards Pt accompanied by: alone   OBJECTIVE:   TODAY'S TREATMENT:  Skilled treatment session focused on pt's communication goals. SLP facilitated session by providing the following interventions:  SLP facilitated vocabulary development by providing sentence completion cues to complete WALC 9: Verbal and Visual Reasoning: Verbal Reasoning-Convergent Reasoning p 110 for 8 out of 11  Pt required moderate assistance to improve speech intelligibility and correct motor movements for initial /th/. SLP facilitated by creating word list of initial /th/ and with moderate verbal assistance, pt able to produce correctly in 90% of opportunities at the word level.    PATIENT  EDUCATION: Education details: see above Person educated: Patient, pt's wive Education method: Explanation Education comprehension: verbalized understanding and needs further education   HOME EXERCISE PROGRAM:  Convergent language tasks   GOALS:   Goals reviewed with patient? Yes   SHORT TERM GOALS: Target date: 10 sessions   SEE PREVIOUS NOTES FOR PROGRESS TOWARDS GOALS  UPDATED: 11/23/2022  5.  The patient will follow simple body commands presented auditorily at 80% accuracy given frequent maximal visual cues. Goal status: INITIAL:  MET   6.  With minimal cues, pt will demonstrate improved reading comprehension by following 2 step written directions with 75% accuracy.  Baseline:  Goal status: INITIAL  7.  With minimal cues, pt will type phrase level basic functional statements (3-4 words in length) in 5 out of 7 opportunities.  Baseline:  Goal status: INITIAL  8.  With minimal cues, pt will imitate basic functional phrase level statements with 75% speech intelligibility.  Baseline:  Goal status: INITIAL  LONG TERM GOALS: Target date: 01/26/2023   SEE PREVIOUS NOTES FOR PROGRESS TOWARDS GOALS  UPDATED: 11/23/2022  Pt will use multimodal means of communication to express basic biographical information about himself with moderate assistance.  Baseline:  Goal  status: INITIAL Goal status: ONGOING; progress made; MET Updated Goal (11/03/2022): Pt will use multimodal means of communication to express basic biographical information about himself, activities within his day and his family with minimal assistance: ongoing    2.  Pt will demonstrate > 90% speech intelligibility when reading orientation information.  Baseline:  Goal status: INITIAL Goal status: ONGOING; progress made  3. Pt will answer semi-complex yes/no questions with > 75% accuracy given minimal cues.   Baseline: 75% with simple yes/no questions  Goal status: INITIAL: MET     ASSESSMENT:   CLINICAL  IMPRESSION: Pt is a 57 year old right handed male who was seen today for a speech-language treatment d/t moderate transcortical sensory aphasia. Pt surprised himself by increased word finding abilities. See treatment note for more details.   OBJECTIVE IMPAIRMENTS include expressive language, receptive language, apraxia, and reading comprehension and ability to produce written language . These impairments are limiting patient from return to work, managing medications, managing appointments, managing finances, household responsibilities, ADLs/IADLs, and effectively communicating at home and in community. Factors affecting potential to achieve goals and functional outcome are severity of impairments. Patient will benefit from skilled SLP services to address above impairments and improve overall function.   REHAB POTENTIAL: Excellent   PLAN: SLP FREQUENCY: 3x/week to 4xweek   SLP DURATION: 12 weeks   PLANNED INTERVENTIONS: Language facilitation, Cueing hierachy, Functional tasks, Multimodal communication approach, SLP instruction and feedback, Compensatory strategies, and Patient/family education      Flynn Gwyn B. Dreama Saa, M.S., CCC-SLP, Tree surgeon Certified Brain Injury Specialist [facility]  [facility] Rehabilitation Services Office [phone] Ascom [phone] Fax [phone]

## 2022-12-19 ENCOUNTER — Ambulatory Visit: Payer: BC Managed Care – PPO | Admitting: Speech Pathology

## 2022-12-19 DIAGNOSIS — R4701 Aphasia: Secondary | ICD-10-CM | POA: Diagnosis not present

## 2022-12-19 DIAGNOSIS — I63512 Cerebral infarction due to unspecified occlusion or stenosis of left middle cerebral artery: Secondary | ICD-10-CM

## 2022-12-19 NOTE — Progress Notes (Signed)
Carelink Summary Report / Loop Recorder 

## 2022-12-19 NOTE — Therapy (Signed)
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 Pt continues to be eager to participate in all sessions with a very support family. As a result he has met 1 of his STGs and is making great progress towards all of his STG and LTGs. See skilled intervention, clinical impressions, and goals below for details.    End of Session - 12/19/22 1255     Visit Number 70    Number of Visits 97    Date for SLP Re-Evaluation 01/26/23    Authorization Type [employer]    Progress Note Due on Visit 70    SLP Start Time 0845    SLP Stop Time  0930    SLP Time Calculation (min) 45 min    Activity Tolerance Patient tolerated treatment well              Past Medical History:  Diagnosis Date   Hypertension    LVH (left ventricular hypertrophy)    Myocardial infarction (HCC)    Vitamin D deficiency    Past Surgical History:  Procedure Laterality Date   ANTERIOR CRUCIATE LIGAMENT REPAIR     COLONOSCOPY WITH PROPOFOL N/A 04/11/2018   Procedure: COLONOSCOPY WITH PROPOFOL;  Surgeon: Toledo, Boykin Nearing, MD;  Location: [facility] ENDOSCOPY;  Service: Gastroenterology;  Laterality: N/A;   GASTRIC RESECTION     IR ANGIO INTRA EXTRACRAN SEL INTERNAL CAROTID UNI L MOD SED  08/15/2022   IR US GUIDE VASC ACCESS RIGHT  08/15/2022   LAPAROTOMY     LOOP RECORDER INSERTION N/A 08/18/2022   Procedure: LOOP RECORDER INSERTION;  Surgeon: Regan Lemming, MD;  Location: [facility] INVASIVE CV LAB;  Service: Cardiovascular;  Laterality: N/A;   RADIOLOGY WITH  ANESTHESIA N/A 08/14/2022   Procedure: IR WITH ANESTHESIA;  Surgeon: Julieanne Cotton, MD;  Location: [facility] OR;  Service: Radiology;  Laterality: N/A;   TEE WITHOUT CARDIOVERSION N/A 08/18/2022   Procedure: TRANSESOPHAGEAL ECHOCARDIOGRAM;  Surgeon: [name], MD;  Location: [facility] INVASIVE CV LAB;  Service: Cardiovascular;  Laterality: N/A;   Patient Active Problem List   Diagnosis Date Noted   Essential hypertension, benign 08/18/2022   Hyperlipidemia 08/18/2022   DM (diabetes mellitus), type 2 (HCC) 08/18/2022   Obesity (BMI 30-39.9) 08/18/2022   CAD (coronary artery disease) 08/18/2022   OSA (obstructive sleep apnea) 08/18/2022   Acute ischemic left MCA stroke (HCC) 08/18/2022   Acute ischemic right MCA stroke (HCC) 08/14/2022   Hypertensive urgency 04/27/2017   ONSET DATE: 08/14/2022; date of referral 08/23/2022    REFERRING DIAG: X91.478 (ICD-10-CM) - Cerebral infarction due to unspecified occlusion or stenosis of left middle cerebral artery   THERAPY DIAG:  Aphasia   Apraxia   Cerebral infarction due to unspecified occlusion or stenosis of left middle cerebral artery (HCC)   Rationale for Evaluation and Treatment Rehabilitation   SUBJECTIVE:      PERTINENT HISTORY:  [name] is 57 year old male who presented to the [facility] ED on 08/14/2022 noted by wife to be very weak and confused and halfway enter out of the bathtub at home.  Symptoms were sudden in onset. Last known well 9 PM. Patient presented with global aphasia. Code stroke activated. CT head negative for hemorrhage, aspects of 9, mild hyperdensity left MCA could indicate thrombus. Tenecteplase administered. CTA showed left M1 occlusion and neurointerventional radiology contacted. Patient transferred to [facility] for thrombectomy. On route he was noted to have periorbital welts and uvular swelling. Was concern of possible angioedema secondary to tenecteplase. He was intubated for airway protection and underwent  cerebral angiogram by Dr. Tommie Sams. Recanalization of the left MCA vascular tree noted. Critical care medicine consulted for management and placed on low-dose Neo-Synephrine infusion. Extubated on 4/23. 2D echo with EF of approximately 60 to 65% with trivial MVR. LDL 96, hemoglobin A1c 6.3%. He was started on heparin subcutaneously for VTE prophylaxis.     DIAGNOSTIC FINDINGS:    CT Head - 08/14/2022 Mild hyperdensity of the left MCA   CT Head and Neck Angio - 08/14/2022 Proximal occlusion of the left MCA M1 segment with poor collateralization throughout the MCA territory.   IR Angio - 08/15/2022 Interval recanalization of the left M1/MCA occlusion seen on prior CT angiogram likely as a result of TNK administration. Therefore, no intervention performed.   MRI 08/15/2022 Acute cortical infarcts along the left frontal operculum and left anterior insula with additional small area of acute cortical infarct within left parietal lobe. Other areas of faint cortical DWI hyperintensity throughout the left MCA territory, including the left putamen, may reflect additional areas of infarct or ischemia. 2. Loss of the left transverse sinus flow void is favored to reflect slow flow. If clinical suspicion for thrombosis, CT venography could be performed for further characterization.     PAIN:  Are you having pain? No   FALLS: Has patient fallen in last 6 months?  No   LIVING ENVIRONMENT: Lives with: lives with their family Lives in: House/apartment   PLOF:  Level of assistance: Independent with ADLs, Independent with IADLs Employment: Full-time employment     PATIENT GOALS    to be able to communicate again   SUBJECTIVE STATEMENT: He reports that he didn't practice the list of /th/ words Pt accompanied by: alone   OBJECTIVE:   TODAY'S TREATMENT:  Skilled treatment session focused on pt's communication goals. SLP facilitated session by providing the following  interventions:  Pt with decreased accuracy of yes/no responses today. Pt responded well to sentence expansion using picture description and maximal faded to moderate cues for speech intelligibility and sentence completion to create 4-5 word sentences using rote template ("the ____ is _______")  PATIENT EDUCATION: Education details: see above Person educated: Patient, pt's wive Education method: Explanation Education comprehension: verbalized understanding and needs further education   HOME EXERCISE PROGRAM:  Convergent language tasks   GOALS:   Goals reviewed with patient? Yes   SHORT TERM GOALS: Target date: 10 sessions   SEE PREVIOUS NOTES FOR PROGRESS TOWARDS GOALS  UPDATED: 11/23/2022  UPDATED: 12/19/2022  5.  The patient will follow simple body commands presented auditorily at 80% accuracy given frequent maximal visual cues. Goal status: INITIAL:  MET   6.  With minimal cues, pt will demonstrate improved reading comprehension by following 2 step written directions with 75% accuracy.  Baseline:  Goal status: INITIAL: ONGOING  7.  With minimal cues, pt will type phrase level basic functional statements (3-4 words in length) in 5 out of 7 opportunities.  Baseline:  Goal status: INITIAL:GOOD PROGRESS MADE 3 OUT OF 7 OPPORTUNITIES  8.  With minimal cues, pt will imitate basic functional phrase level statements with 75% speech intelligibility.  Baseline:  Goal status: INITIAL: GREAT PROGRESS MADE  LONG TERM GOALS: Target date: 01/26/2023   SEE PREVIOUS NOTES FOR PROGRESS TOWARDS GOALS  UPDATED: 11/23/2022  UPDATED: 12/19/2022  Pt will use multimodal means of communication to express basic biographical information about himself with moderate assistance.  Baseline:  Goal status: INITIAL Goal status: ONGOING; progress made; MET Updated Goal (11/03/2022): Pt will use multimodal means of communication to express basic biographical information about himself, activities within  his day and his family with minimal assistance: ongoing    2.  Pt will demonstrate > 90% speech intelligibility when reading orientation information.  Baseline:  Goal status: INITIAL Goal status: ONGOING; progress made  3. Pt will answer semi-complex yes/no questions with > 75% accuracy given minimal cues.   Baseline: 75% with simple yes/no questions  Goal status: INITIAL: MET     ASSESSMENT:   CLINICAL IMPRESSION: Pt is a 57 year old right handed male who was seen today for a speech-language treatment d/t moderate transcortical sensory aphasia. Pt continues to make good progress towards his goals as he is more verbal, attempts to produce more words and readily uses Text to Speech app to supplement verbal communication. See treatment note for more details.   OBJECTIVE IMPAIRMENTS include expressive language, receptive language, apraxia, and reading comprehension and ability to produce written language . These impairments are limiting patient from return to work, managing medications, managing appointments, managing finances, household responsibilities, ADLs/IADLs, and effectively communicating at home and in community. Factors affecting potential to achieve goals and functional outcome are severity of impairments. Patient will benefit from skilled SLP services to address above impairments and improve overall function.   REHAB POTENTIAL: Excellent   PLAN: SLP FREQUENCY: 3x/week to 4xweek   SLP DURATION: 12 weeks   PLANNED INTERVENTIONS: Language facilitation, Cueing hierachy, Functional tasks, Multimodal communication approach, SLP instruction and feedback, Compensatory strategies, and Patient/family education      [name] B. Dreama Saa, M.S., CCC-SLP, Tree surgeon Certified Brain Injury Specialist [facility]  [facility] Office [phone] Ascom [phone] Fax [phone]

## 2022-12-20 ENCOUNTER — Ambulatory Visit: Payer: BC Managed Care – PPO | Admitting: Speech Pathology

## 2022-12-20 DIAGNOSIS — R4701 Aphasia: Secondary | ICD-10-CM | POA: Diagnosis not present

## 2022-12-20 DIAGNOSIS — I63512 Cerebral infarction due to unspecified occlusion or stenosis of left middle cerebral artery: Secondary | ICD-10-CM

## 2022-12-20 NOTE — H&P (View-Only) (Signed)
[facility]   MULTIDISCIPLINARY HEART VALVE CLINIC                                     Cardiology Office Note:    Date:  12/21/2022   ID:  [name], DOB 1965/07/14, MRN [id number]  PCP:  Myrene Buddy, NP  [facility] Cardiologist:  Dr. Excell Seltzer, MD (PFO) [facility] Electrophysiologist:  None   Referring MD: Myrene Buddy, *   Chief Complaint  Patient presents with   Follow-up    Pre PFO evaluation    History of Present Illness:    [name] is a 57 y.o. male with a hx of CVA 07/2022, previously uncontrolled HTN, CKG stage IV with baseline Cr in the 3.0 range, and prediabetes who was referred to Dr. Excell Seltzer for PFO closure and is being seen today for pre-op visit.   Mr. Seabrook developed sudden onset of confusion with right-sided weakness, and aphasia. He was treated with TNK at an outside facility and transferred to [facility] for emergent thrombectomy, but intervention was not required due to recanalization following TNK. MRI showed findings consistent with acute stroke with cortical infarcts along the left frontal lobe and left parietal lobe. TEE at that time showed a PFO with right to left shunt confirmed by bubble study. Transcranial Doppler study confirmed a large PFO with a curtain effect during Valsalva. He underwent ILR placement which has shown no evidence of AF since implant. He was managed by neurology and was referred after discharge for PFO closure.   At consult, he was doing well however noted to have significant speech deficit. Plan was to move forward with closure which is scheduled for 12/30/22 with Dr. Excell Seltzer.   He is here today with his wife and reports that he has been doing well. He continues to have difficulty with speech but otherwise denies chest pain, SOB, palpitations, LE edema, orthopnea, PND, dizziness, or syncope. Denies bleeding in stool or urine.   Past Medical History:  Diagnosis Date   Hypertension    LVH  (left ventricular hypertrophy)    Myocardial infarction (HCC)    Stroke (cerebrum) (HCC)    Vitamin D deficiency     Past Surgical History:  Procedure Laterality Date   ANTERIOR CRUCIATE LIGAMENT REPAIR     COLONOSCOPY WITH PROPOFOL N/A 04/11/2018   Procedure: COLONOSCOPY WITH PROPOFOL;  Surgeon: Toledo, Boykin Nearing, MD;  Location: [facility] ENDOSCOPY;  Service: Gastroenterology;  Laterality: N/A;   GASTRIC RESECTION     IR ANGIO INTRA EXTRACRAN SEL INTERNAL CAROTID UNI L MOD SED  08/15/2022   IR US GUIDE VASC ACCESS RIGHT  08/15/2022   LAPAROTOMY     LOOP RECORDER INSERTION N/A 08/18/2022   Procedure: LOOP RECORDER INSERTION;  Surgeon: Regan Lemming, MD;  Location: [facility] INVASIVE CV LAB;  Service: Cardiovascular;  Laterality: N/A;   RADIOLOGY WITH ANESTHESIA N/A 08/14/2022   Procedure: IR WITH ANESTHESIA;  Surgeon: Julieanne Cotton, MD;  Location: [facility] OR;  Service: Radiology;  Laterality: N/A;   TEE WITHOUT CARDIOVERSION N/A 08/18/2022   Procedure: TRANSESOPHAGEAL ECHOCARDIOGRAM;  Surgeon: [name], MD;  Location: [facility] INVASIVE CV LAB;  Service: Cardiovascular;  Laterality: N/A;    Current Medications: Current Meds  Medication Sig   amLODipine (NORVASC) 10 MG tablet Take 10 mg by mouth daily.   atorvastatin (LIPITOR) 20 MG tablet Take 1 tablet (  20 mg total) by mouth daily.   carvedilol (COREG) 12.5 MG tablet Take 1 tablet (12.5 mg total) by mouth 2 (two) times daily.   cholecalciferol (VITAMIN D3) 25 MCG (1000 UNIT) tablet Take 1,000 Units by mouth daily.   [DISCONTINUED] clopidogrel (PLAVIX) 75 MG tablet Take 1 tablet (75 mg total) by mouth daily.     Allergies:   Amitriptyline, Tenecteplase, Elavil [amitriptyline hcl], and Lisinopril   Social History   Socioeconomic History   Marital status: Married    Spouse name: Not on file   Number of children: Not on file   Years of education: Not on file   Highest education level: Not on file  Occupational History   Not on file   Tobacco Use   Smoking status: Unknown   Smokeless tobacco: Never  Vaping Use   Vaping status: Never Used  Substance and Sexual Activity   Alcohol use: Not Currently   Drug use: Not Currently   Sexual activity: Not on file  Other Topics Concern   Not on file  Social History Narrative   ** Merged History Encounter **       Social Determinants of Health   Financial Resource Strain: Not on file  Food Insecurity: No Food Insecurity (08/17/2022)   Hunger Vital Sign    Worried About Running Out of Food in the Last Year: Never true    Ran Out of Food in the Last Year: Never true  Transportation Needs: No Transportation Needs (08/17/2022)   PRAPARE - Administrator, Civil Service (Medical): No    Lack of Transportation (Non-Medical): No  Physical Activity: Not on file  Stress: Not on file  Social Connections: Not on file    Family History: The patient's family history includes Diabetes Mellitus II in his father and mother.  ROS:   Please see the history of present illness.    All other systems reviewed and are negative.  EKGs/Labs/Other Studies Reviewed:    The following studies were reviewed today:  Cardiac Studies & Procedures       ECHOCARDIOGRAM  ECHOCARDIOGRAM COMPLETE 08/15/2022  Narrative ECHOCARDIOGRAM REPORT    Patient Name:   [name] Date of Exam: 08/15/2022 Medical Rec #:  [id number]              Height:       72.0 in Accession #:    [id number]             Weight:       216.0 lb Date of Birth:  Jul 19, 1965             BSA:          2.202 m Patient Age:    56 years               BP:           105/79 mmHg Patient Gender: M                      HR:           79 bpm. Exam Location:  Inpatient  Procedure: 2D Echo, Cardiac Doppler and Color Doppler  Indications:    Stroke I63.9  History:        Patient has no prior history of Echocardiogram examinations. Stroke; Risk Factors:Hypertension.  Sonographer:    Lucendia Herrlich Referring Phys: [id number] ASHISH ARORA  IMPRESSIONS   1. Left ventricular ejection fraction, by estimation, is  60 to 65%. The left ventricle has normal function. The left ventricle has no regional wall motion abnormalities. Left ventricular diastolic parameters were normal. 2. Right ventricular systolic function is normal. The right ventricular size is normal. 3. The mitral valve is normal in structure. Trivial mitral valve regurgitation. No evidence of mitral stenosis. 4. The aortic valve is normal in structure. Aortic valve regurgitation is not visualized. No aortic stenosis is present. 5. The inferior vena cava is normal in size with greater than 50% respiratory variability, suggesting right atrial pressure of 3 mmHg. 6. Technically limited study due to poor sound wave transmission.  FINDINGS Left Ventricle: Left ventricular ejection fraction, by estimation, is 60 to 65%. The left ventricle has normal function. The left ventricle has no regional wall motion abnormalities. The left ventricular internal cavity size was normal in size. There is no left ventricular hypertrophy. Left ventricular diastolic parameters were normal.  Right Ventricle: The right ventricular size is normal. No increase in right ventricular wall thickness. Right ventricular systolic function is normal.  Left Atrium: Left atrial size was normal in size.  Right Atrium: Right atrial size was normal in size.  Pericardium: There is no evidence of pericardial effusion.  Mitral Valve: The mitral valve is normal in structure. There is mild calcification of the mitral valve leaflet(s). Trivial mitral valve regurgitation. No evidence of mitral valve stenosis.  Tricuspid Valve: The tricuspid valve is normal in structure. Tricuspid valve regurgitation is not demonstrated. No evidence of tricuspid stenosis.  Aortic Valve: The aortic valve is normal in structure. Aortic valve regurgitation is not visualized. No aortic  stenosis is present. Aortic valve peak gradient measures 3.1 mmHg.  Pulmonic Valve: The pulmonic valve was normal in structure. Pulmonic valve regurgitation is not visualized. No evidence of pulmonic stenosis.  Aorta: The aortic root is normal in size and structure.  Venous: The inferior vena cava is normal in size with greater than 50% respiratory variability, suggesting right atrial pressure of 3 mmHg.  IAS/Shunts: No atrial level shunt detected by color flow Doppler.   LEFT VENTRICLE PLAX 2D LVIDd:         4.30 cm Diastology LVIDs:         3.20 cm LV e' medial:    4.29 cm/s LV PW:         0.80 cm LV E/e' medial:  11.7 LV IVS:        1.00 cm LV e' lateral:   7.36 cm/s LV E/e' lateral: 6.8   RIGHT VENTRICLE             IVC RV S prime:     11.10 cm/s  IVC diam: 1.70 cm TAPSE (M-mode): 1.3 cm  LEFT ATRIUM           Index       RIGHT ATRIUM          Index LA diam:      2.80 cm 1.27 cm/m  RA Area:     9.72 cm LA Vol (A2C): 16.4 ml 7.45 ml/m  RA Volume:   16.00 ml 7.27 ml/m AORTIC VALVE AV Vmax:      88.00 cm/s AV Peak Grad: 3.1 mmHg LVOT Vmax:    71.50 cm/s LVOT Vmean:   45.767 cm/s LVOT VTI:     0.120 m  MITRAL VALVE MV Area (PHT): 3.77 cm    SHUNTS MV Decel Time: 201 msec    Systemic VTI: 0.12 m MV E velocity: 50.30 cm/s MV A velocity:  65.10 cm/s MV E/A ratio:  0.77  Arvilla Meres MD Electronically signed by Arvilla Meres MD Signature Date/Time: 08/15/2022/11:36:00 AM    Final   TEE  ECHO TEE 08/18/2022  Narrative TRANSESOPHOGEAL ECHO REPORT    Patient Name:   [name] Date of Exam: 08/18/2022 Medical Rec #:  [id number]              Height:       72.0 in Accession #:    [id number]             Weight:       216.0 lb Date of Birth:  Mar 02, 1966             BSA:          2.202 m Patient Age:    56 years               BP:           154/108 mmHg Patient Gender: M                      HR:           80 bpm. Exam Location:   Inpatient  Procedure: 3D Echo, Transesophageal Echo, Cardiac Doppler and Color Doppler  Indications:     Stroke  History:         Patient has prior history of Echocardiogram examinations, most recent 08/15/2022. Stroke.  Sonographer:     Sheralyn Boatman RDCS Referring Phys:  [id number] SHENG L HALEY Diagnosing Phys: [place]  PROCEDURE: After discussion of the risks and benefits of a TEE, an informed consent was obtained from the patient. The transesophogeal probe was passed without difficulty through the esophogus of the patient. Local oropharyngeal anesthetic was provided with Cetacaine. Sedation performed by different physician. The patient was monitored while under deep sedation. Anesthestetic sedation was provided intravenously by Anesthesiology: 303mg  of Propofol, 60mg  of Lidocaine. The patient's vital signs; including heart rate, blood pressure, and oxygen saturation; remained stable throughout the procedure. The patient developed no complications during the procedure.  IMPRESSIONS   1. Left ventricular ejection fraction, by estimation, is 60 to 65%. The left ventricle has normal function. 2. Right ventricular systolic function is normal. The right ventricular size is normal. 3. No left atrial/left atrial appendage thrombus was detected. 4. Mild proplapse of the anterior leaflet. No evidence of mitral valve regurgitation. 5. The aortic valve is normal in structure. Aortic valve regurgitation is not visualized. 6. Agitated saline contrast bubble study was positive with shunting observed within 3-6 cardiac cycles suggestive of interatrial shunt. There is a small patent foramen ovale.  Conclusion(s)/Recommendation(s): Small PFO. PFO area approximately 0.11 cm2.  FINDINGS Left Ventricle: Left ventricular ejection fraction, by estimation, is 60 to 65%. The left ventricle has normal function. The left ventricular internal cavity size was normal in size.  Right Ventricle: The right  ventricular size is normal. Right ventricular systolic function is normal.  Left Atrium: No left atrial/left atrial appendage thrombus was detected.  Pericardium: There is no evidence of pericardial effusion.  Mitral Valve: Mild proplapse of the anterior leaflet. No evidence of mitral valve regurgitation.  Tricuspid Valve: Tricuspid valve regurgitation is not demonstrated.  Aortic Valve: The aortic valve is normal in structure. Aortic valve regurgitation is not visualized.  Pulmonic Valve: Pulmonic valve regurgitation is not visualized.  Aorta: The aortic root and ascending aorta are structurally normal, with no evidence of dilitation.  IAS/Shunts: Agitated saline  contrast bubble study was positive with shunting observed within 3-6 cardiac cycles suggestive of interatrial shunt. A small patent foramen ovale is detected.  Additional Comments: Spectral Doppler performed.  Carolan Clines Electronically signed by Carolan Clines Signature Date/Time: 08/18/2022/5:34:20 PM    Final            EKG:  EKG is ordered today.  The ekg ordered today demonstrates NSR  Recent Labs: 08/19/2022: ALT 18 08/22/2022: BUN 49; Creatinine, Ser 3.22; Hemoglobin 12.2; Platelets 234; Potassium 4.1; Sodium 137   Recent Lipid Panel    Component Value Date/Time   CHOL 172 08/15/2022 0424   TRIG 125 08/16/2022 0647   HDL 36 (L) 08/15/2022 0424   CHOLHDL 4.8 08/15/2022 0424   VLDL 40 08/15/2022 0424   LDLCALC 96 08/15/2022 0424   Physical Exam:    VS:  BP (!) 152/100   Pulse 77   Ht 6' (1.829 m)   Wt 199 lb 6.4 oz (90.4 kg)   SpO2 98%   BMI 27.04 kg/m     Wt Readings from Last 3 Encounters:  12/21/22 199 lb 6.4 oz (90.4 kg)  11/03/22 200 lb 12.8 oz (91.1 kg)  10/31/22 200 lb (90.7 kg)    General: Well developed, well nourished, NAD Lungs:Clear to ausculation bilaterally. No wheezes, rales, or rhonchi. Breathing is unlabored. Cardiovascular: RRR with S1 S2. No murmurs Extremities: No edema.   Neuro: Alert and oriented. No focal deficits. No facial asymmetry. MAE spontaneously. Psych: Responds to questions appropriately with normal affect.    ASSESSMENT/PLAN:    PFO: Presented to [facility] with acute CVA found to have PFO with right to left shunt confirmed by bubble study. Plan for PFO closure 9/6 with Dr. Excell Seltzer. Instruction letter reviewed with the patient and wife and all questions answered. Obtain BMET and CBC today.   CVA: Continues to have speech deficit. No new neuro changes. Continue Plavix 75mg  daily, Lipitor 20mg  daily  HTN: Elevated today however wife shows me screenshots of BP reading from home which appear to be normal and well controlled in the 110/60 range. Continue amlodipine 10mg  daily and carvedilol 12.5mg .   CKD stage IV: Baseline Cr appears to be in the 3.0 range on lab review. Unclear if the patient follows with nephrology. Will discuss at follow up.     Medication Adjustments/Labs and Tests Ordered: Current medicines are reviewed at length with the patient today.  Concerns regarding medicines are outlined above.  Orders Placed This Encounter  Procedures   Basic metabolic panel   CBC with Differential/Platelet   EKG 12-Lead   Meds ordered this encounter  Medications   clopidogrel (PLAVIX) 75 MG tablet    Sig: Take 1 tablet (75 mg total) by mouth daily.    Dispense:  90 tablet    Refill:  3    Patient Instructions  Medication Instructions:  Your physician recommends that you continue on your current medications as directed. Please refer to the Current Medication list given to you today.  *If you need a refill on your cardiac medications before your next appointment, please call your pharmacy*  Lab Work: Your physician recommends that you have lab work today-BMET and CBC  If you have labs (blood work) drawn today and your tests are completely normal, you will receive your results only by: MyChart Message (if you have MyChart) OR A paper copy in the  mail If you have any lab test that is abnormal or we need to change your treatment, we  will call you to review the results.  Follow-Up: At [facility], you and your health needs are our priority.  As part of our continuing mission to provide you with exceptional heart care, we have created designated Provider Care Teams.  These Care Teams include your primary Cardiologist (physician) and Advanced Practice Providers (APPs -  Physician Assistants and Nurse Practitioners) who all work together to provide you with the care you need, when you need it.  We recommend signing up for the patient portal called "MyChart".  Sign up information is provided on this After Visit Summary.  MyChart is used to connect with patients for Virtual Visits (Telemedicine).  Patients are able to view lab/test results, encounter notes, upcoming appointments, etc.  Non-urgent messages can be sent to your provider as well.   To learn more about what you can do with MyChart, go to [email or web address].    Your next appointment:   1 month(s)  Provider:   Georgie Chard, NP      Signed, Georgie Chard, NP  12/21/2022 12:42 PM    [facility]

## 2022-12-20 NOTE — Progress Notes (Unsigned)
[facility]   MULTIDISCIPLINARY HEART VALVE CLINIC                                     Cardiology Office Note:    Date:  12/21/2022   ID:  [name], DOB 1965/07/14, MRN [id number]  PCP:  Myrene Buddy, NP  [facility] Cardiologist:  Dr. Excell Seltzer, MD (PFO) [facility] Electrophysiologist:  None   Referring MD: Myrene Buddy, *   Chief Complaint  Patient presents with   Follow-up    Pre PFO evaluation    History of Present Illness:    [name] is a 57 y.o. male with a hx of CVA 07/2022, previously uncontrolled HTN, CKG stage IV with baseline Cr in the 3.0 range, and prediabetes who was referred to Dr. Excell Seltzer for PFO closure and is being seen today for pre-op visit.   [name] developed sudden onset of confusion with right-sided weakness, and aphasia. He was treated with TNK at an outside facility and transferred to [facility] for emergent thrombectomy, but intervention was not required due to recanalization following TNK. MRI showed findings consistent with acute stroke with cortical infarcts along the left frontal lobe and left parietal lobe. TEE at that time showed a PFO with right to left shunt confirmed by bubble study. Transcranial Doppler study confirmed a large PFO with a curtain effect during Valsalva. He underwent ILR placement which has shown no evidence of AF since implant. He was managed by neurology and was referred after discharge for PFO closure.   At consult, he was doing well however noted to have significant speech deficit. Plan was to move forward with closure which is scheduled for 12/30/22 with Dr. Excell Seltzer.   He is here today with his wife and reports that he has been doing well. He continues to have difficulty with speech but otherwise denies chest pain, SOB, palpitations, LE edema, orthopnea, PND, dizziness, or syncope. Denies bleeding in stool or urine.   Past Medical History:  Diagnosis Date   Hypertension    LVH  (left ventricular hypertrophy)    Myocardial infarction (HCC)    Stroke (cerebrum) (HCC)    Vitamin D deficiency     Past Surgical History:  Procedure Laterality Date   ANTERIOR CRUCIATE LIGAMENT REPAIR     COLONOSCOPY WITH PROPOFOL N/A 04/11/2018   Procedure: COLONOSCOPY WITH PROPOFOL;  Surgeon: Toledo, Boykin Nearing, MD;  Location: [facility] ENDOSCOPY;  Service: Gastroenterology;  Laterality: N/A;   GASTRIC RESECTION     IR ANGIO INTRA EXTRACRAN SEL INTERNAL CAROTID UNI L MOD SED  08/15/2022   IR US GUIDE VASC ACCESS RIGHT  08/15/2022   LAPAROTOMY     LOOP RECORDER INSERTION N/A 08/18/2022   Procedure: LOOP RECORDER INSERTION;  Surgeon: Regan Lemming, MD;  Location: [facility] INVASIVE CV LAB;  Service: Cardiovascular;  Laterality: N/A;   RADIOLOGY WITH ANESTHESIA N/A 08/14/2022   Procedure: IR WITH ANESTHESIA;  Surgeon: Julieanne Cotton, MD;  Location: [facility] OR;  Service: Radiology;  Laterality: N/A;   TEE WITHOUT CARDIOVERSION N/A 08/18/2022   Procedure: TRANSESOPHAGEAL ECHOCARDIOGRAM;  Surgeon: [name], MD;  Location: [facility] INVASIVE CV LAB;  Service: Cardiovascular;  Laterality: N/A;    Current Medications: Current Meds  Medication Sig   amLODipine (NORVASC) 10 MG tablet Take 10 mg by mouth daily.   atorvastatin (LIPITOR) 20 MG tablet Take 1 tablet (  20 mg total) by mouth daily.   carvedilol (COREG) 12.5 MG tablet Take 1 tablet (12.5 mg total) by mouth 2 (two) times daily.   cholecalciferol (VITAMIN D3) 25 MCG (1000 UNIT) tablet Take 1,000 Units by mouth daily.   [DISCONTINUED] clopidogrel (PLAVIX) 75 MG tablet Take 1 tablet (75 mg total) by mouth daily.     Allergies:   Amitriptyline, Tenecteplase, Elavil [amitriptyline hcl], and Lisinopril   Social History   Socioeconomic History   Marital status: Married    Spouse name: Not on file   Number of children: Not on file   Years of education: Not on file   Highest education level: Not on file  Occupational History   Not on file   Tobacco Use   Smoking status: Unknown   Smokeless tobacco: Never  Vaping Use   Vaping status: Never Used  Substance and Sexual Activity   Alcohol use: Not Currently   Drug use: Not Currently   Sexual activity: Not on file  Other Topics Concern   Not on file  Social History Narrative   ** Merged History Encounter **       Social Determinants of Health   Financial Resource Strain: Not on file  Food Insecurity: No Food Insecurity (08/17/2022)   Hunger Vital Sign    Worried About Running Out of Food in the Last Year: Never true    Ran Out of Food in the Last Year: Never true  Transportation Needs: No Transportation Needs (08/17/2022)   PRAPARE - Administrator, Civil Service (Medical): No    Lack of Transportation (Non-Medical): No  Physical Activity: Not on file  Stress: Not on file  Social Connections: Not on file    Family History: The patient's family history includes Diabetes Mellitus II in his father and mother.  ROS:   Please see the history of present illness.    All other systems reviewed and are negative.  EKGs/Labs/Other Studies Reviewed:    The following studies were reviewed today:  Cardiac Studies & Procedures       ECHOCARDIOGRAM  ECHOCARDIOGRAM COMPLETE 08/15/2022  Narrative ECHOCARDIOGRAM REPORT    Patient Name:   CALEX QUIRINDONGO Partridge Date of Exam: 08/15/2022 Medical Rec #:  [id number]              Height:       72.0 in Accession #:    [id number]             Weight:       216.0 lb Date of Birth:  Jul 19, 1965             BSA:          2.202 m Patient Age:    56 years               BP:           105/79 mmHg Patient Gender: M                      HR:           79 bpm. Exam Location:  Inpatient  Procedure: 2D Echo, Cardiac Doppler and Color Doppler  Indications:    Stroke I63.9  History:        Patient has no prior history of Echocardiogram examinations. Stroke; Risk Factors:Hypertension.  Sonographer:    Lucendia Herrlich Referring Phys: [id number] ASHISH ARORA  IMPRESSIONS   1. Left ventricular ejection fraction, by estimation, is  60 to 65%. The left ventricle has normal function. The left ventricle has no regional wall motion abnormalities. Left ventricular diastolic parameters were normal. 2. Right ventricular systolic function is normal. The right ventricular size is normal. 3. The mitral valve is normal in structure. Trivial mitral valve regurgitation. No evidence of mitral stenosis. 4. The aortic valve is normal in structure. Aortic valve regurgitation is not visualized. No aortic stenosis is present. 5. The inferior vena cava is normal in size with greater than 50% respiratory variability, suggesting right atrial pressure of 3 mmHg. 6. Technically limited study due to poor sound wave transmission.  FINDINGS Left Ventricle: Left ventricular ejection fraction, by estimation, is 60 to 65%. The left ventricle has normal function. The left ventricle has no regional wall motion abnormalities. The left ventricular internal cavity size was normal in size. There is no left ventricular hypertrophy. Left ventricular diastolic parameters were normal.  Right Ventricle: The right ventricular size is normal. No increase in right ventricular wall thickness. Right ventricular systolic function is normal.  Left Atrium: Left atrial size was normal in size.  Right Atrium: Right atrial size was normal in size.  Pericardium: There is no evidence of pericardial effusion.  Mitral Valve: The mitral valve is normal in structure. There is mild calcification of the mitral valve leaflet(s). Trivial mitral valve regurgitation. No evidence of mitral valve stenosis.  Tricuspid Valve: The tricuspid valve is normal in structure. Tricuspid valve regurgitation is not demonstrated. No evidence of tricuspid stenosis.  Aortic Valve: The aortic valve is normal in structure. Aortic valve regurgitation is not visualized. No aortic  stenosis is present. Aortic valve peak gradient measures 3.1 mmHg.  Pulmonic Valve: The pulmonic valve was normal in structure. Pulmonic valve regurgitation is not visualized. No evidence of pulmonic stenosis.  Aorta: The aortic root is normal in size and structure.  Venous: The inferior vena cava is normal in size with greater than 50% respiratory variability, suggesting right atrial pressure of 3 mmHg.  IAS/Shunts: No atrial level shunt detected by color flow Doppler.   LEFT VENTRICLE PLAX 2D LVIDd:         4.30 cm Diastology LVIDs:         3.20 cm LV e' medial:    4.29 cm/s LV PW:         0.80 cm LV E/e' medial:  11.7 LV IVS:        1.00 cm LV e' lateral:   7.36 cm/s LV E/e' lateral: 6.8   RIGHT VENTRICLE             IVC RV S prime:     11.10 cm/s  IVC diam: 1.70 cm TAPSE (M-mode): 1.3 cm  LEFT ATRIUM           Index       RIGHT ATRIUM          Index LA diam:      2.80 cm 1.27 cm/m  RA Area:     9.72 cm LA Vol (A2C): 16.4 ml 7.45 ml/m  RA Volume:   16.00 ml 7.27 ml/m AORTIC VALVE AV Vmax:      88.00 cm/s AV Peak Grad: 3.1 mmHg LVOT Vmax:    71.50 cm/s LVOT Vmean:   45.767 cm/s LVOT VTI:     0.120 m  MITRAL VALVE MV Area (PHT): 3.77 cm    SHUNTS MV Decel Time: 201 msec    Systemic VTI: 0.12 m MV E velocity: 50.30 cm/s MV A velocity:  65.10 cm/s MV E/A ratio:  0.77  Arvilla Meres MD Electronically signed by Arvilla Meres MD Signature Date/Time: 08/15/2022/11:36:00 AM    Final   TEE  ECHO TEE 08/18/2022  Narrative TRANSESOPHOGEAL ECHO REPORT    Patient Name:   ERLIN LAWHORN Barbuto Date of Exam: 08/18/2022 Medical Rec #:  [id number]              Height:       72.0 in Accession #:    [id number]             Weight:       216.0 lb Date of Birth:  Mar 02, 1966             BSA:          2.202 m Patient Age:    56 years               BP:           154/108 mmHg Patient Gender: M                      HR:           80 bpm. Exam Location:   Inpatient  Procedure: 3D Echo, Transesophageal Echo, Cardiac Doppler and Color Doppler  Indications:     Stroke  History:         Patient has prior history of Echocardiogram examinations, most recent 08/15/2022. Stroke.  Sonographer:     Sheralyn Boatman RDCS Referring Phys:  [id number] SHENG L HALEY Diagnosing Phys: [place]  PROCEDURE: After discussion of the risks and benefits of a TEE, an informed consent was obtained from the patient. The transesophogeal probe was passed without difficulty through the esophogus of the patient. Local oropharyngeal anesthetic was provided with Cetacaine. Sedation performed by different physician. The patient was monitored while under deep sedation. Anesthestetic sedation was provided intravenously by Anesthesiology: 303mg  of Propofol, 60mg  of Lidocaine. The patient's vital signs; including heart rate, blood pressure, and oxygen saturation; remained stable throughout the procedure. The patient developed no complications during the procedure.  IMPRESSIONS   1. Left ventricular ejection fraction, by estimation, is 60 to 65%. The left ventricle has normal function. 2. Right ventricular systolic function is normal. The right ventricular size is normal. 3. No left atrial/left atrial appendage thrombus was detected. 4. Mild proplapse of the anterior leaflet. No evidence of mitral valve regurgitation. 5. The aortic valve is normal in structure. Aortic valve regurgitation is not visualized. 6. Agitated saline contrast bubble study was positive with shunting observed within 3-6 cardiac cycles suggestive of interatrial shunt. There is a small patent foramen ovale.  Conclusion(s)/Recommendation(s): Small PFO. PFO area approximately 0.11 cm2.  FINDINGS Left Ventricle: Left ventricular ejection fraction, by estimation, is 60 to 65%. The left ventricle has normal function. The left ventricular internal cavity size was normal in size.  Right Ventricle: The right  ventricular size is normal. Right ventricular systolic function is normal.  Left Atrium: No left atrial/left atrial appendage thrombus was detected.  Pericardium: There is no evidence of pericardial effusion.  Mitral Valve: Mild proplapse of the anterior leaflet. No evidence of mitral valve regurgitation.  Tricuspid Valve: Tricuspid valve regurgitation is not demonstrated.  Aortic Valve: The aortic valve is normal in structure. Aortic valve regurgitation is not visualized.  Pulmonic Valve: Pulmonic valve regurgitation is not visualized.  Aorta: The aortic root and ascending aorta are structurally normal, with no evidence of dilitation.  IAS/Shunts: Agitated saline  contrast bubble study was positive with shunting observed within 3-6 cardiac cycles suggestive of interatrial shunt. A small patent foramen ovale is detected.  Additional Comments: Spectral Doppler performed.  Carolan Clines Electronically signed by Carolan Clines Signature Date/Time: 08/18/2022/5:34:20 PM    Final            EKG:  EKG is ordered today.  The ekg ordered today demonstrates NSR  Recent Labs: 08/19/2022: ALT 18 08/22/2022: BUN 49; Creatinine, Ser 3.22; Hemoglobin 12.2; Platelets 234; Potassium 4.1; Sodium 137   Recent Lipid Panel    Component Value Date/Time   CHOL 172 08/15/2022 0424   TRIG 125 08/16/2022 0647   HDL 36 (L) 08/15/2022 0424   CHOLHDL 4.8 08/15/2022 0424   VLDL 40 08/15/2022 0424   LDLCALC 96 08/15/2022 0424   Physical Exam:    VS:  BP (!) 152/100   Pulse 77   Ht 6' (1.829 m)   Wt 199 lb 6.4 oz (90.4 kg)   SpO2 98%   BMI 27.04 kg/m     Wt Readings from Last 3 Encounters:  12/21/22 199 lb 6.4 oz (90.4 kg)  11/03/22 200 lb 12.8 oz (91.1 kg)  10/31/22 200 lb (90.7 kg)    General: Well developed, well nourished, NAD Lungs:Clear to ausculation bilaterally. No wheezes, rales, or rhonchi. Breathing is unlabored. Cardiovascular: RRR with S1 S2. No murmurs Extremities: No edema.   Neuro: Alert and oriented. No focal deficits. No facial asymmetry. MAE spontaneously. Psych: Responds to questions appropriately with normal affect.    ASSESSMENT/PLAN:    PFO: Presented to [facility] with acute CVA found to have PFO with right to left shunt confirmed by bubble study. Plan for PFO closure 9/6 with Dr. Excell Seltzer. Instruction letter reviewed with the patient and wife and all questions answered. Obtain BMET and CBC today.   CVA: Continues to have speech deficit. No new neuro changes. Continue Plavix 75mg  daily, Lipitor 20mg  daily  HTN: Elevated today however wife shows me screenshots of BP reading from home which appear to be normal and well controlled in the 110/60 range. Continue amlodipine 10mg  daily and carvedilol 12.5mg .   CKD stage IV: Baseline Cr appears to be in the 3.0 range on lab review. Unclear if the patient follows with nephrology. Will discuss at follow up.     Medication Adjustments/Labs and Tests Ordered: Current medicines are reviewed at length with the patient today.  Concerns regarding medicines are outlined above.  Orders Placed This Encounter  Procedures   Basic metabolic panel   CBC with Differential/Platelet   EKG 12-Lead   Meds ordered this encounter  Medications   clopidogrel (PLAVIX) 75 MG tablet    Sig: Take 1 tablet (75 mg total) by mouth daily.    Dispense:  90 tablet    Refill:  3    Patient Instructions  Medication Instructions:  Your physician recommends that you continue on your current medications as directed. Please refer to the Current Medication list given to you today.  *If you need a refill on your cardiac medications before your next appointment, please call your pharmacy*  Lab Work: Your physician recommends that you have lab work today-BMET and CBC  If you have labs (blood work) drawn today and your tests are completely normal, you will receive your results only by: MyChart Message (if you have MyChart) OR A paper copy in the  mail If you have any lab test that is abnormal or we need to change your treatment, we  will call you to review the results.  Follow-Up: At [facility], you and your health needs are our priority.  As part of our continuing mission to provide you with exceptional heart care, we have created designated Provider Care Teams.  These Care Teams include your primary Cardiologist (physician) and Advanced Practice Providers (APPs -  Physician Assistants and Nurse Practitioners) who all work together to provide you with the care you need, when you need it.  We recommend signing up for the patient portal called "MyChart".  Sign up information is provided on this After Visit Summary.  MyChart is used to connect with patients for Virtual Visits (Telemedicine).  Patients are able to view lab/test results, encounter notes, upcoming appointments, etc.  Non-urgent messages can be sent to your provider as well.   To learn more about what you can do with MyChart, go to [email or web address].    Your next appointment:   1 month(s)  Provider:   Georgie Chard, NP      Signed, Georgie Chard, NP  12/21/2022 12:42 PM    [facility]

## 2022-12-20 NOTE — Therapy (Signed)
OUTPATIENT SPEECH LANGUAGE PATHOLOGY  TREATMENT NOTE    Patient Name: [name] MRN: [id number] DOB:[birth year], 57 y.o., male Today's Date: 12/20/2022   PCP: Myrene Buddy, NP REFERRING PROVIDER: Mariam Dollar, PA    End of Session - 12/20/22 0858     Visit Number 71    Number of Visits 97    Date for SLP Re-Evaluation 01/26/23    Authorization Type [employer]    Progress Note Due on Visit 80    SLP Start Time 0845    SLP Stop Time  0930    SLP Time Calculation (min) 45 min    Activity Tolerance Patient tolerated treatment well              Past Medical History:  Diagnosis Date   Hypertension    LVH (left ventricular hypertrophy)    Myocardial infarction (HCC)    Vitamin D deficiency    Past Surgical History:  Procedure Laterality Date   ANTERIOR CRUCIATE LIGAMENT REPAIR     COLONOSCOPY WITH PROPOFOL N/A 04/11/2018   Procedure: COLONOSCOPY WITH PROPOFOL;  Surgeon: Toledo, Boykin Nearing, MD;  Location: [facility] ENDOSCOPY;  Service: Gastroenterology;  Laterality: N/A;   GASTRIC RESECTION     IR ANGIO INTRA EXTRACRAN SEL INTERNAL CAROTID UNI L MOD SED  08/15/2022   IR US GUIDE VASC ACCESS RIGHT  08/15/2022   LAPAROTOMY     LOOP RECORDER INSERTION N/A 08/18/2022   Procedure: LOOP RECORDER INSERTION;  Surgeon: Regan Lemming, MD;  [facility]: [facility] INVASIVE CV LAB;  Service: Cardiovascular;  Laterality: N/A;   RADIOLOGY WITH ANESTHESIA N/A 08/14/2022   Procedure: IR WITH ANESTHESIA;  Surgeon: Julieanne Cotton, MD;  Location: [facility] OR;  Service: Radiology;  Laterality: N/A;   TEE WITHOUT CARDIOVERSION N/A 08/18/2022   Procedure: TRANSESOPHAGEAL ECHOCARDIOGRAM;  Surgeon: [name], MD;  Location: [facility] INVASIVE CV LAB;  Service: Cardiovascular;  Laterality: N/A;   Patient Active Problem List   Diagnosis Date Noted   Essential hypertension, benign 08/18/2022   Hyperlipidemia 08/18/2022   DM (diabetes mellitus), type 2 (HCC) 08/18/2022   Obesity  (BMI 30-39.9) 08/18/2022   CAD (coronary artery disease) 08/18/2022   OSA (obstructive sleep apnea) 08/18/2022   Acute ischemic left MCA stroke (HCC) 08/18/2022   Acute ischemic right MCA stroke (HCC) 08/14/2022   Hypertensive urgency 04/27/2017   ONSET DATE: 08/14/2022; date of referral 08/23/2022    REFERRING DIAG: Z30.865 (ICD-10-CM) - Cerebral infarction due to unspecified occlusion or stenosis of left middle cerebral artery   THERAPY DIAG:  Aphasia   Apraxia   Cerebral infarction due to unspecified occlusion or stenosis of left middle cerebral artery (HCC)   Rationale for Evaluation and Treatment Rehabilitation   SUBJECTIVE:      PERTINENT HISTORY:  [name] is 57 year old male who presented to the [facility] ED on 08/14/2022 noted by wife to be very weak and confused and halfway enter out of the bathtub at home. Symptoms were sudden in onset. Last known well 9 PM. Patient presented with global aphasia. Code stroke activated. CT head negative for hemorrhage, aspects of 9, mild hyperdensity left MCA could indicate thrombus. Tenecteplase administered. CTA showed left M1 occlusion and neurointerventional radiology contacted. Patient transferred to [facility] for thrombectomy. On route he was noted to have periorbital welts and uvular swelling. Was concern of possible angioedema secondary to tenecteplase. He was intubated for airway protection and underwent cerebral angiogram by Dr. Tommie Sams. Recanalization of the  left MCA vascular tree noted. Critical care medicine consulted for management and placed on low-dose Neo-Synephrine infusion. Extubated on 4/23. 2D echo with EF of approximately 60 to 65% with trivial MVR. LDL 96, hemoglobin A1c 6.3%. He was started on heparin subcutaneously for VTE prophylaxis.     DIAGNOSTIC FINDINGS:    CT Head - 08/14/2022 Mild hyperdensity of the left MCA   CT Head and Neck Angio - 08/14/2022 Proximal occlusion of the left MCA M1 segment  with poor collateralization throughout the MCA territory.   IR Angio - 08/15/2022 Interval recanalization of the left M1/MCA occlusion seen on prior CT angiogram likely as a result of TNK administration. Therefore, no intervention performed.   MRI 08/15/2022 Acute cortical infarcts along the left frontal operculum and left anterior insula with additional small area of acute cortical infarct within left parietal lobe. Other areas of faint cortical DWI hyperintensity throughout the left MCA territory, including the left putamen, may reflect additional areas of infarct or ischemia. 2. Loss of the left transverse sinus flow void is favored to reflect slow flow. If clinical suspicion for thrombosis, CT venography could be performed for further characterization.     PAIN:  Are you having pain? No   FALLS: Has patient fallen in last 6 months?  No   LIVING ENVIRONMENT: Lives with: lives with their family Lives in: House/apartment   PLOF:  Level of assistance: Independent with ADLs, Independent with IADLs Employment: Full-time employment     PATIENT GOALS    to be able to communicate again   SUBJECTIVE STATEMENT: Pt appeared anxious Pt accompanied by: alone   OBJECTIVE:   TODAY'S TREATMENT:  Skilled treatment session focused on pt's communication goals. SLP facilitated session by providing the following interventions:  Pt initially struggled with activities today which was uncharacteristic of pt. When questioned, he gestures and reports that he has an appt for an infusion today and he is nervous about it. Pt by himself so unable to gather any further information.   SLP facilitated session by providing moderate verbal assistance using Talk Path Therapy App's What doesn't Belong Level 1 to achieve 75% accuracy. Pt able to select the items that didn't belong but struggled with naming the category that the item didn't belong to.     PATIENT EDUCATION: Education details: see  above Person educated: Patient, pt's wive Education method: Explanation Education comprehension: verbalized understanding and needs further education   HOME EXERCISE PROGRAM:  Convergent language tasks   GOALS:   Goals reviewed with patient? Yes   SHORT TERM GOALS: Target date: 10 sessions   SEE PREVIOUS NOTES FOR PROGRESS TOWARDS GOALS  UPDATED: 11/23/2022  UPDATED: 12/19/2022  5.  The patient will follow simple body commands presented auditorily at 80% accuracy given frequent maximal visual cues. Goal status: INITIAL:  MET   6.  With minimal cues, pt will demonstrate improved reading comprehension by following 2 step written directions with 75% accuracy.  Baseline:  Goal status: INITIAL: ONGOING  7.  With minimal cues, pt will type phrase level basic functional statements (3-4 words in length) in 5 out of 7 opportunities.  Baseline:  Goal status: INITIAL:GOOD PROGRESS MADE 3 OUT OF 7 OPPORTUNITIES  8.  With minimal cues, pt will imitate basic functional phrase level statements with 75% speech intelligibility.  Baseline:  Goal status: INITIAL: GREAT PROGRESS MADE  LONG TERM GOALS: Target date: 01/26/2023   SEE PREVIOUS NOTES FOR PROGRESS TOWARDS GOALS  UPDATED: 11/23/2022  UPDATED: 12/19/2022  Pt will use multimodal means of communication to express basic biographical information about himself with moderate assistance.  Baseline:  Goal status: INITIAL Goal status: ONGOING; progress made; MET Updated Goal (11/03/2022): Pt will use multimodal means of communication to express basic biographical information about himself, activities within his day and his family with minimal assistance: ongoing    2.  Pt will demonstrate > 90% speech intelligibility when reading orientation information.  Baseline:  Goal status: INITIAL Goal status: ONGOING; progress made  3. Pt will answer semi-complex yes/no questions with > 75% accuracy given minimal cues.   Baseline: 75% with simple  yes/no questions  Goal status: INITIAL: MET     ASSESSMENT:   CLINICAL IMPRESSION: Pt is a 57 year old right handed male who was seen today for a speech-language treatment d/t moderate transcortical sensory aphasia. Pt continues to make good progress towards his goals as he is more verbal, attempts to produce more words and readily uses Text to Speech app to supplement verbal communication. Today, [t's anxiety was distracting and he struggled with tasks. See treatment note for more details.   OBJECTIVE IMPAIRMENTS include expressive language, receptive language, apraxia, and reading comprehension and ability to produce written language . These impairments are limiting patient from return to work, managing medications, managing appointments, managing finances, household responsibilities, ADLs/IADLs, and effectively communicating at home and in community. Factors affecting potential to achieve goals and functional outcome are severity of impairments. Patient will benefit from skilled SLP services to address above impairments and improve overall function.   REHAB POTENTIAL: Excellent   PLAN: SLP FREQUENCY: 3x/week to 4xweek   SLP DURATION: 12 weeks   PLANNED INTERVENTIONS: Language facilitation, Cueing hierachy, Functional tasks, Multimodal communication approach, SLP instruction and feedback, Compensatory strategies, and Patient/family education      Icarus Partch B. Dreama Saa, M.S., CCC-SLP, Tree surgeon Certified Brain Injury Specialist [facility]  [facility] Rehabilitation Services Office [phone] Ascom [phone] Fax [phone]

## 2022-12-21 ENCOUNTER — Ambulatory Visit: Payer: BC Managed Care – PPO | Attending: Internal Medicine | Admitting: Cardiology

## 2022-12-21 ENCOUNTER — Ambulatory Visit: Payer: BC Managed Care – PPO | Admitting: Speech Pathology

## 2022-12-21 VITALS — BP 152/100 | HR 77 | Ht 72.0 in | Wt 199.4 lb

## 2022-12-21 DIAGNOSIS — I253 Aneurysm of heart: Secondary | ICD-10-CM | POA: Diagnosis not present

## 2022-12-21 DIAGNOSIS — Q2112 Patent foramen ovale: Secondary | ICD-10-CM | POA: Diagnosis not present

## 2022-12-21 DIAGNOSIS — I10 Essential (primary) hypertension: Secondary | ICD-10-CM

## 2022-12-21 DIAGNOSIS — R4701 Aphasia: Secondary | ICD-10-CM

## 2022-12-21 DIAGNOSIS — I63511 Cerebral infarction due to unspecified occlusion or stenosis of right middle cerebral artery: Secondary | ICD-10-CM

## 2022-12-21 DIAGNOSIS — I63512 Cerebral infarction due to unspecified occlusion or stenosis of left middle cerebral artery: Secondary | ICD-10-CM

## 2022-12-21 MED ORDER — CLOPIDOGREL BISULFATE 75 MG PO TABS: 75.0000 mg | ORAL_TABLET | Freq: Every day | ORAL | 3 refills | Status: AC

## 2022-12-21 NOTE — Patient Instructions (Addendum)
Medication Instructions:  Your physician recommends that you continue on your current medications as directed. Please refer to the Current Medication list given to you today.  *If you need a refill on your cardiac medications before your next appointment, please call your pharmacy*  Lab Work: Your physician recommends that you have lab work today-BMET and CBC  If you have labs (blood work) drawn today and your tests are completely normal, you will receive your results only by: MyChart Message (if you have MyChart) OR A paper copy in the mail If you have any lab test that is abnormal or we need to change your treatment, we will call you to review the results.  Follow-Up: At [facility], you and your health needs are our priority.  As part of our continuing mission to provide you with exceptional heart care, we have created designated Provider Care Teams.  These Care Teams include your primary Cardiologist (physician) and Advanced Practice Providers (APPs -  Physician Assistants and Nurse Practitioners) who all work together to provide you with the care you need, when you need it.  We recommend signing up for the patient portal called "MyChart".  Sign up information is provided on this After Visit Summary.  MyChart is used to connect with patients for Virtual Visits (Telemedicine).  Patients are able to view lab/test results, encounter notes, upcoming appointments, etc.  Non-urgent messages can be sent to your provider as well.   To learn more about what you can do with MyChart, go to [email or web address].    Your next appointment:   1 month(s)  Provider:   Georgie Chard, NP

## 2022-12-21 NOTE — Therapy (Signed)
OUTPATIENT SPEECH LANGUAGE PATHOLOGY  TREATMENT NOTE    Patient Name: [name] MRN: [id number] DOB:24-May-1965, 57 y.o., male Today's Date: 12/21/2022   PCP: Myrene Buddy, NP REFERRING PROVIDER: Mariam Dollar, PA    End of Session - 12/21/22 0850     Visit Number 72    Number of Visits 97    Date for SLP Re-Evaluation 01/26/23    Authorization Type [employer]    Progress Note Due on Visit 80    SLP Start Time 0845    SLP Stop Time  0930    SLP Time Calculation (min) 45 min    Activity Tolerance Patient tolerated treatment well              Past Medical History:  Diagnosis Date   Hypertension    LVH (left ventricular hypertrophy)    Myocardial infarction (HCC)    Vitamin D deficiency    Past Surgical History:  Procedure Laterality Date   ANTERIOR CRUCIATE LIGAMENT REPAIR     COLONOSCOPY WITH PROPOFOL N/A 04/11/2018   Procedure: COLONOSCOPY WITH PROPOFOL;  Surgeon: Toledo, Boykin Nearing, MD;  Location: [facility] ENDOSCOPY;  Service: Gastroenterology;  Laterality: N/A;   GASTRIC RESECTION     IR ANGIO INTRA EXTRACRAN SEL INTERNAL CAROTID UNI L MOD SED  08/15/2022   IR US GUIDE VASC ACCESS RIGHT  08/15/2022   LAPAROTOMY     LOOP RECORDER INSERTION N/A 08/18/2022   Procedure: LOOP RECORDER INSERTION;  Surgeon: Regan Lemming, MD;  Location: [facility] INVASIVE CV LAB;  Service: Cardiovascular;  Laterality: N/A;   RADIOLOGY WITH ANESTHESIA N/A 08/14/2022   Procedure: IR WITH ANESTHESIA;  Surgeon: Julieanne Cotton, MD;  Location: [facility] OR;  Service: Radiology;  Laterality: N/A;   TEE WITHOUT CARDIOVERSION N/A 08/18/2022   Procedure: TRANSESOPHAGEAL ECHOCARDIOGRAM;  Surgeon: [name], MD;  Location: [facility] INVASIVE CV LAB;  Service: Cardiovascular;  Laterality: N/A;   Patient Active Problem List   Diagnosis Date Noted   Essential hypertension, benign 08/18/2022   Hyperlipidemia 08/18/2022   DM (diabetes mellitus), type 2 (HCC) 08/18/2022   Obesity  (BMI 30-39.9) 08/18/2022   CAD (coronary artery disease) 08/18/2022   OSA (obstructive sleep apnea) 08/18/2022   Acute ischemic left MCA stroke (HCC) 08/18/2022   Acute ischemic right MCA stroke (HCC) 08/14/2022   Hypertensive urgency 04/27/2017   ONSET DATE: 08/14/2022; date of referral 08/23/2022    REFERRING DIAG: W09.811 (ICD-10-CM) - Cerebral infarction due to unspecified occlusion or stenosis of left middle cerebral artery   THERAPY DIAG:  Aphasia   Apraxia   Cerebral infarction due to unspecified occlusion or stenosis of left middle cerebral artery (HCC)   Rationale for Evaluation and Treatment Rehabilitation   SUBJECTIVE:      PERTINENT HISTORY:  [name] is 57 year old male who presented to the [facility] ED on 08/14/2022 noted by wife to be very weak and confused and halfway enter out of the bathtub at home. Symptoms were sudden in onset. Last known well 9 PM. Patient presented with global aphasia. Code stroke activated. CT head negative for hemorrhage, aspects of 9, mild hyperdensity left MCA could indicate thrombus. Tenecteplase administered. CTA showed left M1 occlusion and neurointerventional radiology contacted. Patient transferred to [facility] for thrombectomy. On route he was noted to have periorbital welts and uvular swelling. Was concern of possible angioedema secondary to tenecteplase. He was intubated for airway protection and underwent cerebral angiogram by Dr. Tommie Sams. Recanalization of the  left MCA vascular tree noted. Critical care medicine consulted for management and placed on low-dose Neo-Synephrine infusion. Extubated on 4/23. 2D echo with EF of approximately 60 to 65% with trivial MVR. LDL 96, hemoglobin A1c 6.3%. He was started on heparin subcutaneously for VTE prophylaxis.     DIAGNOSTIC FINDINGS:    CT Head - 08/14/2022 Mild hyperdensity of the left MCA   CT Head and Neck Angio - 08/14/2022 Proximal occlusion of the left MCA M1 segment  with poor collateralization throughout the MCA territory.   IR Angio - 08/15/2022 Interval recanalization of the left M1/MCA occlusion seen on prior CT angiogram likely as a result of TNK administration. Therefore, no intervention performed.   MRI 08/15/2022 Acute cortical infarcts along the left frontal operculum and left anterior insula with additional small area of acute cortical infarct within left parietal lobe. Other areas of faint cortical DWI hyperintensity throughout the left MCA territory, including the left putamen, may reflect additional areas of infarct or ischemia. 2. Loss of the left transverse sinus flow void is favored to reflect slow flow. If clinical suspicion for thrombosis, CT venography could be performed for further characterization.     PAIN:  Are you having pain? No   FALLS: Has patient fallen in last 6 months?  No   LIVING ENVIRONMENT: Lives with: lives with their family Lives in: House/apartment   PLOF:  Level of assistance: Independent with ADLs, Independent with IADLs Employment: Full-time employment     PATIENT GOALS    to be able to communicate again   SUBJECTIVE STATEMENT: Pt states that his procedure is today at 26 - review of chart indicates that he has  an appt with cardiovascular today - he is able to point to his arm as indicating he will need an IV of sorts Pt accompanied by: alone   OBJECTIVE:   TODAY'S TREATMENT:  Skilled treatment session focused on pt's communication goals. SLP facilitated session by providing the following interventions:  SLP facilitated session by providing moderate verbal assistance using Talk Path Therapy App's What doesn't Belong Level 1 to achieve 95% accuracy independently. Progressing to Level 2 with 80% with rare Min A   Reading comprehension was targeted thru the following activities Sentence Completion Level 3 - 80% with min A Picture Description Level 3 - 80% with Min A    PATIENT  EDUCATION: Education details: see above Person educated: Patient Education method: Explanation Education comprehension: verbalized understanding and needs further education   HOME EXERCISE PROGRAM:  Convergent language tasks   GOALS:   Goals reviewed with patient? Yes   SHORT TERM GOALS: Target date: 10 sessions   SEE PREVIOUS NOTES FOR PROGRESS TOWARDS GOALS  UPDATED: 11/23/2022  UPDATED: 12/19/2022  5.  The patient will follow simple body commands presented auditorily at 80% accuracy given frequent maximal visual cues. Goal status: INITIAL:  MET   6.  With minimal cues, pt will demonstrate improved reading comprehension by following 2 step written directions with 75% accuracy.  Baseline:  Goal status: INITIAL: ONGOING  7.  With minimal cues, pt will type phrase level basic functional statements (3-4 words in length) in 5 out of 7 opportunities.  Baseline:  Goal status: INITIAL:GOOD PROGRESS MADE 3 OUT OF 7 OPPORTUNITIES  8.  With minimal cues, pt will imitate basic functional phrase level statements with 75% speech intelligibility.  Baseline:  Goal status: INITIAL: GREAT PROGRESS MADE  LONG TERM GOALS: Target date: 01/26/2023   SEE PREVIOUS NOTES FOR PROGRESS  TOWARDS GOALS  UPDATED: 11/23/2022  UPDATED: 12/19/2022  Pt will use multimodal means of communication to express basic biographical information about himself with moderate assistance.  Baseline:  Goal status: INITIAL Goal status: ONGOING; progress made; MET Updated Goal (11/03/2022): Pt will use multimodal means of communication to express basic biographical information about himself, activities within his day and his family with minimal assistance: ongoing    2.  Pt will demonstrate > 90% speech intelligibility when reading orientation information.  Baseline:  Goal status: INITIAL Goal status: ONGOING; progress made  3. Pt will answer semi-complex yes/no questions with > 75% accuracy given minimal cues.    Baseline: 75% with simple yes/no questions  Goal status: INITIAL: MET     ASSESSMENT:   CLINICAL IMPRESSION: Pt is a 57 year old right handed male who was seen today for a speech-language treatment d/t moderate transcortical sensory aphasia. Pt continues to make good progress towards his goals as he is more verbal, attempts to produce more words and readily uses Text to Speech app to supplement verbal communication. Today, he was better able to manage his anxiety for improved task completion. See treatment note for more details.   OBJECTIVE IMPAIRMENTS include expressive language, receptive language, apraxia, and reading comprehension and ability to produce written language . These impairments are limiting patient from return to work, managing medications, managing appointments, managing finances, household responsibilities, ADLs/IADLs, and effectively communicating at home and in community. Factors affecting potential to achieve goals and functional outcome are severity of impairments. Patient will benefit from skilled SLP services to address above impairments and improve overall function.   REHAB POTENTIAL: Excellent   PLAN: SLP FREQUENCY: 3x/week to 4xweek   SLP DURATION: 12 weeks   PLANNED INTERVENTIONS: Language facilitation, Cueing hierachy, Functional tasks, Multimodal communication approach, SLP instruction and feedback, Compensatory strategies, and Patient/family education      Toshia Larkin B. Dreama Saa, M.S., CCC-SLP, Tree surgeon Certified Brain Injury Specialist [facility]  [facility] Rehabilitation Services Office [phone] Ascom [phone] Fax [phone]

## 2022-12-22 ENCOUNTER — Ambulatory Visit: Payer: BC Managed Care – PPO | Admitting: Speech Pathology

## 2022-12-22 ENCOUNTER — Other Ambulatory Visit: Payer: Self-pay

## 2022-12-22 DIAGNOSIS — R4701 Aphasia: Secondary | ICD-10-CM

## 2022-12-22 DIAGNOSIS — I253 Aneurysm of heart: Secondary | ICD-10-CM

## 2022-12-22 DIAGNOSIS — I63512 Cerebral infarction due to unspecified occlusion or stenosis of left middle cerebral artery: Secondary | ICD-10-CM

## 2022-12-22 LAB — CBC WITH DIFFERENTIAL/PLATELET
Basophils Absolute: 0.1 10*3/uL (ref 0.0–0.2)
Basos: 1 %
EOS (ABSOLUTE): 0.5 10*3/uL — ABNORMAL HIGH (ref 0.0–0.4)
Eos: 6 %
Hematocrit: 43.1 % (ref 37.5–51.0)
Hemoglobin: 13.6 g/dL (ref 13.0–17.7)
Immature Grans (Abs): 0 10*3/uL (ref 0.0–0.1)
Immature Granulocytes: 0 %
Lymphocytes Absolute: 1.9 10*3/uL (ref 0.7–3.1)
Lymphs: 25 %
MCH: 26.7 pg (ref 26.6–33.0)
MCHC: 31.6 g/dL (ref 31.5–35.7)
MCV: 85 fL (ref 79–97)
Monocytes Absolute: 0.6 10*3/uL (ref 0.1–0.9)
Monocytes: 8 %
Neutrophils Absolute: 4.5 10*3/uL (ref 1.4–7.0)
Neutrophils: 60 %
Platelets: 236 10*3/uL (ref 150–450)
RBC: 5.1 x10E6/uL (ref 4.14–5.80)
RDW: 13.4 % (ref 11.6–15.4)
WBC: 7.5 10*3/uL (ref 3.4–10.8)

## 2022-12-22 LAB — BASIC METABOLIC PANEL
BUN/Creatinine Ratio: 10 (ref 9–20)
BUN: 27 mg/dL — ABNORMAL HIGH (ref 6–24)
CO2: 19 mmol/L — ABNORMAL LOW (ref 20–29)
Calcium: 9.8 mg/dL (ref 8.7–10.2)
Chloride: 103 mmol/L (ref 96–106)
Creatinine, Ser: 2.66 mg/dL — ABNORMAL HIGH (ref 0.76–1.27)
Glucose: 92 mg/dL (ref 70–99)
Potassium: 4.5 mmol/L (ref 3.5–5.2)
Sodium: 141 mmol/L (ref 134–144)
eGFR: 27 mL/min/{1.73_m2} — ABNORMAL LOW (ref >59)

## 2022-12-22 NOTE — Therapy (Signed)
OUTPATIENT SPEECH LANGUAGE PATHOLOGY  TREATMENT NOTE    Patient Name: [name] MRN: [id number] DOB:01-May-1965, 57 y.o., male Today's Date: 12/22/2022   PCP: Myrene Buddy, NP REFERRING PROVIDER: Mariam Dollar, PA    End of Session - 12/22/22 0901     Visit Number 73    Number of Visits 97    Date for SLP Re-Evaluation 01/26/23    Authorization Type [employer]    Progress Note Due on Visit 80    SLP Start Time 0845    SLP Stop Time  0930    SLP Time Calculation (min) 45 min    Activity Tolerance Patient tolerated treatment well              Past Medical History:  Diagnosis Date   Hypertension    LVH (left ventricular hypertrophy)    Myocardial infarction (HCC)    Vitamin D deficiency    Past Surgical History:  Procedure Laterality Date   ANTERIOR CRUCIATE LIGAMENT REPAIR     COLONOSCOPY WITH PROPOFOL N/A 04/11/2018   Procedure: COLONOSCOPY WITH PROPOFOL;  Surgeon: Toledo, Boykin Nearing, MD;  Location: [facility] ENDOSCOPY;  Service: Gastroenterology;  Laterality: N/A;   GASTRIC RESECTION     IR ANGIO INTRA EXTRACRAN SEL INTERNAL CAROTID UNI L MOD SED  08/15/2022   IR US GUIDE VASC ACCESS RIGHT  08/15/2022   LAPAROTOMY     LOOP RECORDER INSERTION N/A 08/18/2022   Procedure: LOOP RECORDER INSERTION;  Surgeon: Regan Lemming, MD;  [facility]: [facility] INVASIVE CV LAB;  Service: Cardiovascular;  Laterality: N/A;   RADIOLOGY WITH ANESTHESIA N/A 08/14/2022   Procedure: IR WITH ANESTHESIA;  Surgeon: Julieanne Cotton, MD;  Location: [facility] OR;  Service: Radiology;  Laterality: N/A;   TEE WITHOUT CARDIOVERSION N/A 08/18/2022   Procedure: TRANSESOPHAGEAL ECHOCARDIOGRAM;  Surgeon: [name], MD;  Location: [facility] INVASIVE CV LAB;  Service: Cardiovascular;  Laterality: N/A;   Patient Active Problem List   Diagnosis Date Noted   Essential hypertension, benign 08/18/2022   Hyperlipidemia 08/18/2022   DM (diabetes mellitus), type 2 (HCC) 08/18/2022   Obesity  (BMI 30-39.9) 08/18/2022   CAD (coronary artery disease) 08/18/2022   OSA (obstructive sleep apnea) 08/18/2022   Acute ischemic left MCA stroke (HCC) 08/18/2022   Acute ischemic right MCA stroke (HCC) 08/14/2022   Hypertensive urgency 04/27/2017   ONSET DATE: 08/14/2022; date of referral 08/23/2022    REFERRING DIAG: N82.956 (ICD-10-CM) - Cerebral infarction due to unspecified occlusion or stenosis of left middle cerebral artery   THERAPY DIAG:  Aphasia   Apraxia   Cerebral infarction due to unspecified occlusion or stenosis of left middle cerebral artery (HCC)   Rationale for Evaluation and Treatment Rehabilitation   SUBJECTIVE:      PERTINENT HISTORY:  [name] is 57 year old male who presented to the [facility] ED on 08/14/2022 noted by wife to be very weak and confused and halfway enter out of the bathtub at home. Symptoms were sudden in onset. Last known well 9 PM. Patient presented with global aphasia. Code stroke activated. CT head negative for hemorrhage, aspects of 9, mild hyperdensity left MCA could indicate thrombus. Tenecteplase administered. CTA showed left M1 occlusion and neurointerventional radiology contacted. Patient transferred to [facility] for thrombectomy. On route he was noted to have periorbital welts and uvular swelling. Was concern of possible angioedema secondary to tenecteplase. He was intubated for airway protection and underwent cerebral angiogram by Dr. Tommie Sams. Recanalization of the  left MCA vascular tree noted. Critical care medicine consulted for management and placed on low-dose Neo-Synephrine infusion. Extubated on 4/23. 2D echo with EF of approximately 60 to 65% with trivial MVR. LDL 96, hemoglobin A1c 6.3%. He was started on heparin subcutaneously for VTE prophylaxis.     DIAGNOSTIC FINDINGS:    CT Head - 08/14/2022 Mild hyperdensity of the left MCA   CT Head and Neck Angio - 08/14/2022 Proximal occlusion of the left MCA M1 segment  with poor collateralization throughout the MCA territory.   IR Angio - 08/15/2022 Interval recanalization of the left M1/MCA occlusion seen on prior CT angiogram likely as a result of TNK administration. Therefore, no intervention performed.   MRI 08/15/2022 Acute cortical infarcts along the left frontal operculum and left anterior insula with additional small area of acute cortical infarct within left parietal lobe. Other areas of faint cortical DWI hyperintensity throughout the left MCA territory, including the left putamen, may reflect additional areas of infarct or ischemia. 2. Loss of the left transverse sinus flow void is favored to reflect slow flow. If clinical suspicion for thrombosis, CT venography could be performed for further characterization.     PAIN:  Are you having pain? No   FALLS: Has patient fallen in last 6 months?  No   LIVING ENVIRONMENT: Lives with: lives with their family Lives in: House/apartment   PLOF:  Level of assistance: Independent with ADLs, Independent with IADLs Employment: Full-time employment     PATIENT GOALS    to be able to communicate again   SUBJECTIVE STATEMENT: Pt indicates that he is relieved that his appt from yesterday is over Pt accompanied by: alone   OBJECTIVE:   TODAY'S TREATMENT:  Skilled treatment session focused on pt's communication goals. SLP facilitated session by providing the following interventions:  SLP facilitated session by providing moderate verbal assistance using Talk Path Therapy App's Arranging the steps in the correct order (reading multiple 3 to 4 steps directions and placing them in order). Pt required moderate assistance to achieve 75% accuracy specifically when presenting with intermingled 4-steps directions.   PATIENT EDUCATION: Education details: see above Person educated: Patient Education method: Explanation Education comprehension: verbalized understanding and needs further education    HOME EXERCISE PROGRAM:  Convergent language tasks   GOALS:   Goals reviewed with patient? Yes   SHORT TERM GOALS: Target date: 10 sessions   SEE PREVIOUS NOTES FOR PROGRESS TOWARDS GOALS  UPDATED: 11/23/2022  UPDATED: 12/19/2022  5.  The patient will follow simple body commands presented auditorily at 80% accuracy given frequent maximal visual cues. Goal status: INITIAL:  MET   6.  With minimal cues, pt will demonstrate improved reading comprehension by following 2 step written directions with 75% accuracy.  Baseline:  Goal status: INITIAL: ONGOING  7.  With minimal cues, pt will type phrase level basic functional statements (3-4 words in length) in 5 out of 7 opportunities.  Baseline:  Goal status: INITIAL:GOOD PROGRESS MADE 3 OUT OF 7 OPPORTUNITIES  8.  With minimal cues, pt will imitate basic functional phrase level statements with 75% speech intelligibility.  Baseline:  Goal status: INITIAL: GREAT PROGRESS MADE  LONG TERM GOALS: Target date: 01/26/2023   SEE PREVIOUS NOTES FOR PROGRESS TOWARDS GOALS  UPDATED: 11/23/2022  UPDATED: 12/19/2022  Pt will use multimodal means of communication to express basic biographical information about himself with moderate assistance.  Baseline:  Goal status: INITIAL Goal status: ONGOING; progress made; MET Updated Goal (11/03/2022): Pt will  use multimodal means of communication to express basic biographical information about himself, activities within his day and his family with minimal assistance: ongoing    2.  Pt will demonstrate > 90% speech intelligibility when reading orientation information.  Baseline:  Goal status: INITIAL Goal status: ONGOING; progress made  3. Pt will answer semi-complex yes/no questions with > 75% accuracy given minimal cues.   Baseline: 75% with simple yes/no questions  Goal status: INITIAL: MET     ASSESSMENT:   CLINICAL IMPRESSION: Pt is a 57 year old right handed male who was seen today for a  speech-language treatment d/t moderate transcortical sensory aphasia. Pt continues to make good progress towards his goals as he is more verbal, attempts to produce more words and readily uses Text to Speech app to supplement verbal communication.  See treatment note for more details.   OBJECTIVE IMPAIRMENTS include expressive language, receptive language, apraxia, and reading comprehension and ability to produce written language . These impairments are limiting patient from return to work, managing medications, managing appointments, managing finances, household responsibilities, ADLs/IADLs, and effectively communicating at home and in community. Factors affecting potential to achieve goals and functional outcome are severity of impairments. Patient will benefit from skilled SLP services to address above impairments and improve overall function.   REHAB POTENTIAL: Excellent   PLAN: SLP FREQUENCY: 3x/week to 4xweek   SLP DURATION: 12 weeks   PLANNED INTERVENTIONS: Language facilitation, Cueing hierachy, Functional tasks, Multimodal communication approach, SLP instruction and feedback, Compensatory strategies, and Patient/family education      Kindrick Lankford B. Dreama Saa, M.S., CCC-SLP, Tree surgeon Certified Brain Injury Specialist [facility]  [facility] Rehabilitation Services Office [phone] Ascom [phone] Fax [phone]

## 2022-12-27 ENCOUNTER — Ambulatory Visit: Payer: BC Managed Care – PPO | Attending: Physician Assistant

## 2022-12-27 DIAGNOSIS — I63512 Cerebral infarction due to unspecified occlusion or stenosis of left middle cerebral artery: Secondary | ICD-10-CM | POA: Insufficient documentation

## 2022-12-27 DIAGNOSIS — R482 Apraxia: Secondary | ICD-10-CM | POA: Insufficient documentation

## 2022-12-27 DIAGNOSIS — R4701 Aphasia: Secondary | ICD-10-CM | POA: Diagnosis present

## 2022-12-27 NOTE — Therapy (Addendum)
OUTPATIENT SPEECH LANGUAGE PATHOLOGY  TREATMENT NOTE    Patient Name: YOUSUF Warner MRN: [id number] DOB:July 06, 1965, 57 y.o., male Today's Date: 12/27/2022   PCP: Myrene Buddy, NP REFERRING PROVIDER: Mariam Dollar, PA    End of Session - 12/27/22 0911     Visit Number 74    Number of Visits 97    Date for SLP Re-Evaluation 01/26/23    Authorization Type [employer]    Progress Note Due on Visit 80    SLP Start Time [phone]    SLP Stop Time  0930    SLP Time Calculation (min) 40 min    Activity Tolerance Patient tolerated treatment well              Past Medical History:  Diagnosis Date   Hypertension    LVH (left ventricular hypertrophy)    Myocardial infarction (HCC)    Vitamin D deficiency    Past Surgical History:  Procedure Laterality Date   ANTERIOR CRUCIATE LIGAMENT REPAIR     COLONOSCOPY WITH PROPOFOL N/A 04/11/2018   Procedure: COLONOSCOPY WITH PROPOFOL;  Surgeon: Toledo, Boykin Nearing, MD;  Location: [facility] ENDOSCOPY;  Service: Gastroenterology;  Laterality: N/A;   GASTRIC RESECTION     IR ANGIO INTRA EXTRACRAN SEL INTERNAL CAROTID UNI L MOD SED  08/15/2022   IR US GUIDE VASC ACCESS RIGHT  08/15/2022   LAPAROTOMY     LOOP RECORDER INSERTION N/A 08/18/2022   Procedure: LOOP RECORDER INSERTION;  Surgeon: Regan Lemming, MD;  Location: [facility] INVASIVE CV LAB;  Service: Cardiovascular;  Laterality: N/A;   RADIOLOGY WITH ANESTHESIA N/A 08/14/2022   Procedure: IR WITH ANESTHESIA;  Surgeon: Julieanne Cotton, MD;  Location: [facility] OR;  Service: Radiology;  Laterality: N/A;   TEE WITHOUT CARDIOVERSION N/A 08/18/2022   Procedure: TRANSESOPHAGEAL ECHOCARDIOGRAM;  Surgeon: [name], MD;  Location: [facility] INVASIVE CV LAB;  Service: Cardiovascular;  Laterality: N/A;   Patient Active Problem List   Diagnosis Date Noted   Essential hypertension, benign 08/18/2022   Hyperlipidemia 08/18/2022   DM (diabetes mellitus), type 2 (HCC) 08/18/2022   Obesity  (BMI 30-39.9) 08/18/2022   CAD (coronary artery disease) 08/18/2022   OSA (obstructive sleep apnea) 08/18/2022   Acute ischemic left MCA stroke (HCC) 08/18/2022   Acute ischemic right MCA stroke (HCC) 08/14/2022   Hypertensive urgency 04/27/2017   ONSET DATE: 08/14/2022; date of referral 08/23/2022    REFERRING DIAG: Q03.474 (ICD-10-CM) - Cerebral infarction due to unspecified occlusion or stenosis of left middle cerebral artery   THERAPY DIAG:  Aphasia   Apraxia   Cerebral infarction due to unspecified occlusion or stenosis of left middle cerebral artery (HCC)   Rationale for Evaluation and Treatment Rehabilitation   SUBJECTIVE:      PERTINENT HISTORY:  [name] Warner is 57 year old male who presented to the [facility] ED on 08/14/2022 noted by wife to be very weak and confused and halfway enter out of the bathtub at home. Symptoms were sudden in onset. Last known well 9 PM. Patient presented with global aphasia. Code stroke activated. CT head negative for hemorrhage, aspects of 9, mild hyperdensity left MCA could indicate thrombus. Tenecteplase administered. CTA showed left M1 occlusion and neurointerventional radiology contacted. Patient transferred to [facility] for thrombectomy. On route he was noted to have periorbital welts and uvular swelling. Was concern of possible angioedema secondary to tenecteplase. He was intubated for airway protection and underwent cerebral angiogram by Dr. Tommie Sams. Recanalization of the  left MCA vascular tree noted. Critical care medicine consulted for management and placed on low-dose Neo-Synephrine infusion. Extubated on 4/23. 2D echo with EF of approximately 60 to 65% with trivial MVR. LDL 96, hemoglobin A1c 6.3%. He was started on heparin subcutaneously for VTE prophylaxis.     DIAGNOSTIC FINDINGS:    CT Head - 08/14/2022 Mild hyperdensity of the left MCA   CT Head and Neck Angio - 08/14/2022 Proximal occlusion of the left MCA M1 segment  with poor collateralization throughout the MCA territory.   IR Angio - 08/15/2022 Interval recanalization of the left M1/MCA occlusion seen on prior CT angiogram likely as a result of TNK administration. Therefore, no intervention performed.   MRI 08/15/2022 Acute cortical infarcts along the left frontal operculum and left anterior insula with additional small area of acute cortical infarct within left parietal lobe. Other areas of faint cortical DWI hyperintensity throughout the left MCA territory, including the left putamen, may reflect additional areas of infarct or ischemia. 2. Loss of the left transverse sinus flow void is favored to reflect slow flow. If clinical suspicion for thrombosis, CT venography could be performed for further characterization.     PAIN:  [name] having pain? No   FALLS: Has patient fallen in last 6 months?  No   LIVING ENVIRONMENT: Lives with: lives with their family Lives in: House/apartment   PLOF:  Level of assistance: Independent with ADLs, Independent with IADLs Employment: Full-time employment     PATIENT GOALS    to be able to communicate again   SUBJECTIVE STATEMENT: Pt indicates he has his PFO closure this week. Drove self here. Pt accompanied by: alone   OBJECTIVE:   TODAY'S TREATMENT:  Skilled treatment session focused on pt's communication goals. SLP facilitated session by providing the following interventions:  SLP facilitated session by providing moderate verbal assistance using Talk Path Therapy App's Arranging the steps in the correct order (reading multiple 3-4 step and 5-6 step directions and placing them in order). Pt required min cues to achieve 80% accuracy for 3-4 steps, mod cues to achieve 75% accuracy for 5-6 steps. Oral reading is often paraphasic and halting; however, pt with good reading comprehension for today's tasks.     Pt communicated complex ideas during informal conversational exchanges with multimodal  communication including speech and indep   use of text-to-speech on phone.   PATIENT EDUCATION: Education details: see above Person educated: Patient Education method: Explanation Education comprehension: verbalized understanding and needs further education   HOME EXERCISE PROGRAM:  Not provided this date   GOALS:   Goals reviewed with patient? Yes   SHORT TERM GOALS: Target date: 10 sessions   SEE PREVIOUS NOTES FOR PROGRESS TOWARDS GOALS  UPDATED: 11/23/2022  UPDATED: 12/19/2022  5.  The patient will follow simple body commands presented auditorily at 80% accuracy given frequent maximal visual cues. Goal status: INITIAL:  MET   6.  With minimal cues, pt will demonstrate improved reading comprehension by following 2 step written directions with 75% accuracy.  Baseline:  Goal status: INITIAL: ONGOING  7.  With minimal cues, pt will type phrase level basic functional statements (3-4 words in length) in 5 out of 7 opportunities.  Baseline:  Goal status: INITIAL:GOOD PROGRESS MADE 3 OUT OF 7 OPPORTUNITIES  8.  With minimal cues, pt will imitate basic functional phrase level statements with 75% speech intelligibility.  Baseline:  Goal status: INITIAL: GREAT PROGRESS MADE  LONG TERM GOALS: Target date: 01/26/2023  SEE PREVIOUS NOTES FOR PROGRESS TOWARDS GOALS  UPDATED: 11/23/2022  UPDATED: 12/19/2022  Pt will use multimodal means of communication to express basic biographical information about himself with moderate assistance.  Baseline:  Goal status: INITIAL Goal status: ONGOING; progress made; MET Updated Goal (11/03/2022): Pt will use multimodal means of communication to express basic biographical information about himself, activities within his day and his family with minimal assistance: ongoing    2.  Pt will demonstrate > 90% speech intelligibility when reading orientation information.  Baseline:  Goal status: INITIAL Goal status: ONGOING; progress made  3. Pt  will answer semi-complex yes/no questions with > 75% accuracy given minimal cues.   Baseline: 75% with simple yes/no questions  Goal status: INITIAL: MET     ASSESSMENT:   CLINICAL IMPRESSION: Pt is a 57 year old right handed male who was seen today for a speech-language treatment d/t moderate transcortical sensory aphasia. Pt continues to make good progress towards his goals as he is more verbal, attempts to produce more words and readily uses Text to Speech app to supplement verbal communication.  See treatment note for more details.   OBJECTIVE IMPAIRMENTS include expressive language, receptive language, apraxia, and reading comprehension and ability to produce written language . These impairments are limiting patient from return to work, managing medications, managing appointments, managing finances, household responsibilities, ADLs/IADLs, and effectively communicating at home and in community. Factors affecting potential to achieve goals and functional outcome are severity of impairments. Patient will benefit from skilled SLP services to address above impairments and improve overall function.   REHAB POTENTIAL: Excellent   PLAN: SLP FREQUENCY: 3x/week to 4xweek   SLP DURATION: 12 weeks   PLANNED INTERVENTIONS: Language facilitation, Cueing hierachy, Functional tasks, Multimodal communication approach, SLP instruction and feedback, Compensatory strategies, and Patient/family education     Clyde Canterbury, M.S., CCC-SLP Speech-Language Pathologist [facility] [phone] (ASCOM)

## 2022-12-28 ENCOUNTER — Ambulatory Visit: Payer: BC Managed Care – PPO | Admitting: Speech Pathology

## 2022-12-28 ENCOUNTER — Encounter: Payer: Self-pay | Admitting: Cardiovascular Disease

## 2022-12-28 DIAGNOSIS — I63512 Cerebral infarction due to unspecified occlusion or stenosis of left middle cerebral artery: Secondary | ICD-10-CM

## 2022-12-28 DIAGNOSIS — R4701 Aphasia: Secondary | ICD-10-CM

## 2022-12-28 NOTE — Therapy (Signed)
OUTPATIENT SPEECH LANGUAGE PATHOLOGY  TREATMENT NOTE    Patient Name: [name] MRN: [id number] DOB:1965-11-12, 57 y.o., male Today's Date: 12/28/2022   PCP: Myrene Buddy, NP REFERRING PROVIDER: Mariam Dollar, PA    End of Session - 12/28/22 1006     Visit Number 75    Number of Visits 97    Date for SLP Re-Evaluation 01/26/23    Authorization Type [employer]    Progress Note Due on Visit 80    SLP Start Time 0800    SLP Stop Time  0845    SLP Time Calculation (min) 45 min    Activity Tolerance Patient tolerated treatment well              Past Medical History:  Diagnosis Date   Hypertension    LVH (left ventricular hypertrophy)    Myocardial infarction (HCC)    Vitamin D deficiency    Past Surgical History:  Procedure Laterality Date   ANTERIOR CRUCIATE LIGAMENT REPAIR     COLONOSCOPY WITH PROPOFOL N/A 04/11/2018   Procedure: COLONOSCOPY WITH PROPOFOL;  Surgeon: Toledo, Boykin Nearing, MD;  Location: [facility] ENDOSCOPY;  Service: Gastroenterology;  Laterality: N/A;   GASTRIC RESECTION     IR ANGIO INTRA EXTRACRAN SEL INTERNAL CAROTID UNI L MOD SED  08/15/2022   IR US GUIDE VASC ACCESS RIGHT  08/15/2022   LAPAROTOMY     LOOP RECORDER INSERTION N/A 08/18/2022   Procedure: LOOP RECORDER INSERTION;  Surgeon: Regan Lemming, MD;  Location: [facility] INVASIVE CV LAB;  Service: Cardiovascular;  Laterality: N/A;   RADIOLOGY WITH ANESTHESIA N/A 08/14/2022   Procedure: IR WITH ANESTHESIA;  Surgeon: Julieanne Cotton, MD;  Location: [facility] OR;  Service: Radiology;  Laterality: N/A;   TEE WITHOUT CARDIOVERSION N/A 08/18/2022   Procedure: TRANSESOPHAGEAL ECHOCARDIOGRAM;  Surgeon: [name], MD;  Location: [facility] INVASIVE CV LAB;  Service: Cardiovascular;  Laterality: N/A;   Patient Active Problem List   Diagnosis Date Noted   Essential hypertension, benign 08/18/2022   Hyperlipidemia 08/18/2022   DM (diabetes mellitus), type 2 (HCC) 08/18/2022   Obesity  (BMI 30-39.9) 08/18/2022   CAD (coronary artery disease) 08/18/2022   OSA (obstructive sleep apnea) 08/18/2022   Acute ischemic left MCA stroke (HCC) 08/18/2022   Acute ischemic right MCA stroke (HCC) 08/14/2022   Hypertensive urgency 04/27/2017   ONSET DATE: 08/14/2022; date of referral 08/23/2022    REFERRING DIAG: V25.366 (ICD-10-CM) - Cerebral infarction due to unspecified occlusion or stenosis of left middle cerebral artery   THERAPY DIAG:  Aphasia   Apraxia   Cerebral infarction due to unspecified occlusion or stenosis of left middle cerebral artery (HCC)   Rationale for Evaluation and Treatment Rehabilitation   SUBJECTIVE:      PERTINENT HISTORY:  [name] is 57 year old male who presented to the [facility] ED on 08/14/2022 noted by wife to be very weak and confused and halfway enter out of the bathtub at home. Symptoms were sudden in onset. Last known well 9 PM. Patient presented with global aphasia. Code stroke activated. CT head negative for hemorrhage, aspects of 9, mild hyperdensity left MCA could indicate thrombus. Tenecteplase administered. CTA showed left M1 occlusion and neurointerventional radiology contacted. Patient transferred to [facility] for thrombectomy. On route he was noted to have periorbital welts and uvular swelling. Was concern of possible angioedema secondary to tenecteplase. He was intubated for airway protection and underwent cerebral angiogram by Dr. Tommie Sams. Recanalization of the  left MCA vascular tree noted. Critical care medicine consulted for management and placed on low-dose Neo-Synephrine infusion. Extubated on 4/23. 2D echo with EF of approximately 60 to 65% with trivial MVR. LDL 96, hemoglobin A1c 6.3%. He was started on heparin subcutaneously for VTE prophylaxis.     DIAGNOSTIC FINDINGS:    CT Head - 08/14/2022 Mild hyperdensity of the left MCA   CT Head and Neck Angio - 08/14/2022 Proximal occlusion of the left MCA M1 segment  with poor collateralization throughout the MCA territory.   IR Angio - 08/15/2022 Interval recanalization of the left M1/MCA occlusion seen on prior CT angiogram likely as a result of TNK administration. Therefore, no intervention performed.   MRI 08/15/2022 Acute cortical infarcts along the left frontal operculum and left anterior insula with additional small area of acute cortical infarct within left parietal lobe. Other areas of faint cortical DWI hyperintensity throughout the left MCA territory, including the left putamen, may reflect additional areas of infarct or ischemia. 2. Loss of the left transverse sinus flow void is favored to reflect slow flow. If clinical suspicion for thrombosis, CT venography could be performed for further characterization.     PAIN:  Are you having pain? No   FALLS: Has patient fallen in last 6 months?  No   LIVING ENVIRONMENT: Lives with: lives with their family Lives in: House/apartment   PLOF:  Level of assistance: Independent with ADLs, Independent with IADLs Employment: Full-time employment     PATIENT GOALS    to be able to communicate again   SUBJECTIVE STATEMENT: Pt indicates he has his PFO closure this week. Drove self here. Pt accompanied by: alone   OBJECTIVE:   TODAY'S TREATMENT:  Skilled treatment session focused on pt's communication goals. SLP facilitated session by providing the following interventions:    Pt communicated complex ideas during informal conversational exchanges with multimodal communication including speech and indep   use of text-to-speech on phone. Benefited from having information written down to aid in receptive language abilities.   PATIENT EDUCATION: Education details: see above Person educated: Patient Education method: Explanation Education comprehension: verbalized understanding and needs further education   HOME EXERCISE PROGRAM:  N/A   GOALS:   Goals reviewed with patient? Yes    SHORT TERM GOALS: Target date: 10 sessions   SEE PREVIOUS NOTES FOR PROGRESS TOWARDS GOALS  UPDATED: 11/23/2022  UPDATED: 12/19/2022  5.  The patient will follow simple body commands presented auditorily at 80% accuracy given frequent maximal visual cues. Goal status: INITIAL:  MET   6.  With minimal cues, pt will demonstrate improved reading comprehension by following 2 step written directions with 75% accuracy.  Baseline:  Goal status: INITIAL: ONGOING  7.  With minimal cues, pt will type phrase level basic functional statements (3-4 words in length) in 5 out of 7 opportunities.  Baseline:  Goal status: INITIAL:GOOD PROGRESS MADE 3 OUT OF 7 OPPORTUNITIES  8.  With minimal cues, pt will imitate basic functional phrase level statements with 75% speech intelligibility.  Baseline:  Goal status: INITIAL: GREAT PROGRESS MADE  LONG TERM GOALS: Target date: 01/26/2023   SEE PREVIOUS NOTES FOR PROGRESS TOWARDS GOALS  UPDATED: 11/23/2022  UPDATED: 12/19/2022  Pt will use multimodal means of communication to express basic biographical information about himself with moderate assistance.  Baseline:  Goal status: INITIAL Goal status: ONGOING; progress made; MET Updated Goal (11/03/2022): Pt will use multimodal means of communication to express basic biographical information about himself, activities within  his day and his family with minimal assistance: ongoing    2.  Pt will demonstrate > 90% speech intelligibility when reading orientation information.  Baseline:  Goal status: INITIAL Goal status: ONGOING; progress made  3. Pt will answer semi-complex yes/no questions with > 75% accuracy given minimal cues.   Baseline: 75% with simple yes/no questions  Goal status: INITIAL: MET     ASSESSMENT:   CLINICAL IMPRESSION: Pt is a 57 year old right handed male who was seen today for a speech-language treatment d/t moderate transcortical sensory aphasia. Pt continues to make good progress  towards his goals as he is more verbal, attempts to produce more words and readily uses Text to Speech app to supplement verbal communication.  See treatment note for more details.   OBJECTIVE IMPAIRMENTS include expressive language, receptive language, apraxia, and reading comprehension and ability to produce written language . These impairments are limiting patient from return to work, managing medications, managing appointments, managing finances, household responsibilities, ADLs/IADLs, and effectively communicating at home and in community. Factors affecting potential to achieve goals and functional outcome are severity of impairments. Patient will benefit from skilled SLP services to address above impairments and improve overall function.   REHAB POTENTIAL: Excellent   PLAN: SLP FREQUENCY: 3x/week to 4xweek   SLP DURATION: 12 weeks   PLANNED INTERVENTIONS: Language facilitation, Cueing hierachy, Functional tasks, Multimodal communication approach, SLP instruction and feedback, Compensatory strategies, and Patient/family education     Diella Gillingham B. Dreama Saa, M.S., CCC-SLP, Tree surgeon Certified Brain Injury Specialist [facility]  [facility] Rehabilitation Services Office [phone] Ascom [phone] Fax [phone]

## 2022-12-29 ENCOUNTER — Ambulatory Visit: Payer: BC Managed Care – PPO | Admitting: Speech Pathology

## 2022-12-29 ENCOUNTER — Telehealth: Payer: Self-pay | Admitting: *Deleted

## 2022-12-29 DIAGNOSIS — R4701 Aphasia: Secondary | ICD-10-CM

## 2022-12-29 DIAGNOSIS — I63512 Cerebral infarction due to unspecified occlusion or stenosis of left middle cerebral artery: Secondary | ICD-10-CM

## 2022-12-29 NOTE — Telephone Encounter (Addendum)
Patient's wife reports pt prescribed antibiotic (from [facility]-12/23/22 pt was prescribed doxycycline 100 mg bid for 10 days) for lesion on his hip, the lesion is now completely healed over, advised wife okay to take antibiotic as prescribed in the morning prior to procedure.

## 2022-12-29 NOTE — Therapy (Signed)
OUTPATIENT SPEECH LANGUAGE PATHOLOGY  TREATMENT NOTE    Patient Name: [name] MRN: [id number] DOB:1966/02/29, 57 y.o., male Today's Date: 12/29/2022   PCP: Myrene Buddy, NP REFERRING PROVIDER: Mariam Dollar, PA    End of Session - 12/29/22 1040     Visit Number 76    Number of Visits 97    Date for SLP Re-Evaluation 01/26/23    Authorization Type [employer]    Progress Note Due on Visit 80    SLP Start Time 0800    SLP Stop Time  0845    SLP Time Calculation (min) 45 min    Activity Tolerance Patient tolerated treatment well              Past Medical History:  Diagnosis Date   Hypertension    LVH (left ventricular hypertrophy)    Myocardial infarction (HCC)    Vitamin D deficiency    Past Surgical History:  Procedure Laterality Date   ANTERIOR CRUCIATE LIGAMENT REPAIR     COLONOSCOPY WITH PROPOFOL N/A 04/11/2018   Procedure: COLONOSCOPY WITH PROPOFOL;  Surgeon: Toledo, Boykin Nearing, MD;  Location: [facility] ENDOSCOPY;  Service: Gastroenterology;  Laterality: N/A;   GASTRIC RESECTION     IR ANGIO INTRA EXTRACRAN SEL INTERNAL CAROTID UNI L MOD SED  08/15/2022   IR US GUIDE VASC ACCESS RIGHT  08/15/2022   LAPAROTOMY     LOOP RECORDER INSERTION N/A 08/18/2022   Procedure: LOOP RECORDER INSERTION;  Surgeon: Regan Lemming, MD;  Location: [facility] INVASIVE CV LAB;  Service: Cardiovascular;  Laterality: N/A;   RADIOLOGY WITH ANESTHESIA N/A 08/14/2022   Procedure: IR WITH ANESTHESIA;  Surgeon: Julieanne Cotton, MD;  Location: [facility] OR;  Service: Radiology;  Laterality: N/A;   TEE WITHOUT CARDIOVERSION N/A 08/18/2022   Procedure: TRANSESOPHAGEAL ECHOCARDIOGRAM;  Surgeon: [name], MD;  Location: [facility] INVASIVE CV LAB;  Service: Cardiovascular;  Laterality: N/A;   Patient Active Problem List   Diagnosis Date Noted   Essential hypertension, benign 08/18/2022   Hyperlipidemia 08/18/2022   DM (diabetes mellitus), type 2 (HCC) 08/18/2022   Obesity  (BMI 30-39.9) 08/18/2022   CAD (coronary artery disease) 08/18/2022   OSA (obstructive sleep apnea) 08/18/2022   Acute ischemic left MCA stroke (HCC) 08/18/2022   Acute ischemic right MCA stroke (HCC) 08/14/2022   Hypertensive urgency 04/27/2017   ONSET DATE: 08/14/2022; date of referral 08/23/2022    REFERRING DIAG: O13.086 (ICD-10-CM) - Cerebral infarction due to unspecified occlusion or stenosis of left middle cerebral artery   THERAPY DIAG:  Aphasia   Apraxia   Cerebral infarction due to unspecified occlusion or stenosis of left middle cerebral artery (HCC)   Rationale for Evaluation and Treatment Rehabilitation   SUBJECTIVE:      PERTINENT HISTORY:  [name] is 57 year old male who presented to the [facility] ED on 08/14/2022 noted by wife to be very weak and confused and halfway enter out of the bathtub at home. Symptoms were sudden in onset. Last known well 9 PM. Patient presented with global aphasia. Code stroke activated. CT head negative for hemorrhage, aspects of 9, mild hyperdensity left MCA could indicate thrombus. Tenecteplase administered. CTA showed left M1 occlusion and neurointerventional radiology contacted. Patient transferred to [facility] for thrombectomy. On route he was noted to have periorbital welts and uvular swelling. Was concern of possible angioedema secondary to tenecteplase. He was intubated for airway protection and underwent cerebral angiogram by Dr. Tommie Sams. Recanalization of the  left MCA vascular tree noted. Critical care medicine consulted for management and placed on low-dose Neo-Synephrine infusion. Extubated on 4/23. 2D echo with EF of approximately 60 to 65% with trivial MVR. LDL 96, hemoglobin A1c 6.3%. He was started on heparin subcutaneously for VTE prophylaxis.     DIAGNOSTIC FINDINGS:    CT Head - 08/14/2022 Mild hyperdensity of the left MCA   CT Head and Neck Angio - 08/14/2022 Proximal occlusion of the left MCA M1 segment  with poor collateralization throughout the MCA territory.   IR Angio - 08/15/2022 Interval recanalization of the left M1/MCA occlusion seen on prior CT angiogram likely as a result of TNK administration. Therefore, no intervention performed.   MRI 08/15/2022 Acute cortical infarcts along the left frontal operculum and left anterior insula with additional small area of acute cortical infarct within left parietal lobe. Other areas of faint cortical DWI hyperintensity throughout the left MCA territory, including the left putamen, may reflect additional areas of infarct or ischemia. 2. Loss of the left transverse sinus flow void is favored to reflect slow flow. If clinical suspicion for thrombosis, CT venography could be performed for further characterization.     PAIN:  Are you having pain? No   FALLS: Has patient fallen in last 6 months?  No   LIVING ENVIRONMENT: Lives with: lives with their family Lives in: House/apartment   PLOF:  Level of assistance: Independent with ADLs, Independent with IADLs Employment: Full-time employment     PATIENT GOALS    to be able to communicate again   SUBJECTIVE STATEMENT: Using gestures, pt indicated that he had a procedure tomorrow and would not be able to attend scheduled ST session Pt accompanied by: alone   OBJECTIVE:   TODAY'S TREATMENT:  Skilled treatment session focused on pt's communication goals. SLP facilitated session by providing the following interventions:    Object naming using carrier phrase "I see a ______" with pictures of basic objects. With verbal cues during production of carrier phrase, pt required initial phonemic cues of all words for word finding. Pt's utterances contained semantic paraphasias, phonological paraphasias and neologistic paraphasias for 14 out of 14 objects.   Pt mentioned his principal's name and the date October 1st. In an effort to gather more information, pt was in agreement with this Clinical research associate  calling his wife during the session. She provides that pt, herself and his supervisor are talking about a potential return to work. This Clinical research associate to potentially reach out to pt's supervisor and inquire about potential statements that could be incorporated into sessions as well as demands of job.   PATIENT EDUCATION: Education details: see above Person educated: Patient and his wife via phone Education method: Explanation Education comprehension: verbalized understanding and needs further education   HOME EXERCISE PROGRAM:  N/A   GOALS:   Goals reviewed with patient? Yes   SHORT TERM GOALS: Target date: 10 sessions   SEE PREVIOUS NOTES FOR PROGRESS TOWARDS GOALS  UPDATED: 11/23/2022  UPDATED: 12/19/2022  5.  The patient will follow simple body commands presented auditorily at 80% accuracy given frequent maximal visual cues. Goal status: INITIAL:  MET   6.  With minimal cues, pt will demonstrate improved reading comprehension by following 2 step written directions with 75% accuracy.  Baseline:  Goal status: INITIAL: ONGOING  7.  With minimal cues, pt will type phrase level basic functional statements (3-4 words in length) in 5 out of 7 opportunities.  Baseline:  Goal status: INITIAL:GOOD PROGRESS MADE 3  OUT OF 7 OPPORTUNITIES  8.  With minimal cues, pt will imitate basic functional phrase level statements with 75% speech intelligibility.  Baseline:  Goal status: INITIAL: GREAT PROGRESS MADE  LONG TERM GOALS: Target date: 01/26/2023   SEE PREVIOUS NOTES FOR PROGRESS TOWARDS GOALS  UPDATED: 11/23/2022  UPDATED: 12/19/2022  Pt will use multimodal means of communication to express basic biographical information about himself with moderate assistance.  Baseline:  Goal status: INITIAL Goal status: ONGOING; progress made; MET Updated Goal (11/03/2022): Pt will use multimodal means of communication to express basic biographical information about himself, activities within his day and  his family with minimal assistance: ongoing    2.  Pt will demonstrate > 90% speech intelligibility when reading orientation information.  Baseline:  Goal status: INITIAL Goal status: ONGOING; progress made  3. Pt will answer semi-complex yes/no questions with > 75% accuracy given minimal cues.   Baseline: 75% with simple yes/no questions  Goal status: INITIAL: MET     ASSESSMENT:   CLINICAL IMPRESSION: Pt is a 57 year old right handed male who was seen today for a speech-language treatment d/t moderate transcortical sensory aphasia. Pt excited to independently produce a verbal sentence during today's session "I don't know what to do." Will work towards potential functional return to work.  See treatment note for more details.   OBJECTIVE IMPAIRMENTS include expressive language, receptive language, apraxia, and reading comprehension and ability to produce written language . These impairments are limiting patient from return to work, managing medications, managing appointments, managing finances, household responsibilities, ADLs/IADLs, and effectively communicating at home and in community. Factors affecting potential to achieve goals and functional outcome are severity of impairments. Patient will benefit from skilled SLP services to address above impairments and improve overall function.   REHAB POTENTIAL: Excellent   PLAN: SLP FREQUENCY: 3x/week to 4xweek   SLP DURATION: 12 weeks   PLANNED INTERVENTIONS: Language facilitation, Cueing hierachy, Functional tasks, Multimodal communication approach, SLP instruction and feedback, Compensatory strategies, and Patient/family education     Charli Liberatore B. Dreama Saa, M.S., CCC-SLP, Tree surgeon Certified Brain Injury Specialist [facility]  [facility] Rehabilitation Services Office [phone] Ascom [phone] Fax [phone]

## 2022-12-29 NOTE — Telephone Encounter (Signed)
PFO Closure scheduled at [facility] for: Friday December 30, 2022 7:30 AM Arrival time [facility] Main Entrance A at: 5:30 AM  Nothing to eat after midnight prior to procedure, clear liquids until 5 AM day of procedure.  Medication instructions: -Usual morning medications can be taken with sips of water including Plavix 75 mg.  Plan to go home the same day, you will only stay overnight if medically necessary.  You must have responsible adult to drive you home.  Someone must be with you the first 24 hours after you arrive home.  Reviewed procedure instructions with patient's wife (DPR).

## 2022-12-30 ENCOUNTER — Other Ambulatory Visit: Payer: Self-pay

## 2022-12-30 ENCOUNTER — Encounter (HOSPITAL_COMMUNITY)
Admission: RE | Disposition: A | Discharge status: Home / Self Care | Payer: BC Managed Care – PPO | Source: Home / Self Care | Attending: Cardiovascular Disease

## 2022-12-30 ENCOUNTER — Ambulatory Visit: Payer: BC Managed Care – PPO | Admitting: Speech Pathology

## 2022-12-30 ENCOUNTER — Ambulatory Visit (HOSPITAL_BASED_OUTPATIENT_CLINIC_OR_DEPARTMENT_OTHER): Payer: BC Managed Care – PPO

## 2022-12-30 ENCOUNTER — Ambulatory Visit (HOSPITAL_COMMUNITY)
Admission: RE | Admit: 2022-12-30 | Discharge: 2022-12-30 | Disposition: A | Discharge status: Home / Self Care | Payer: BC Managed Care – PPO | Attending: Cardiovascular Disease | Admitting: Cardiovascular Disease

## 2022-12-30 DIAGNOSIS — R7303 Prediabetes: Secondary | ICD-10-CM | POA: Diagnosis not present

## 2022-12-30 DIAGNOSIS — R479 Unspecified speech disturbances: Secondary | ICD-10-CM | POA: Insufficient documentation

## 2022-12-30 DIAGNOSIS — Z79899 Other long term (current) drug therapy: Secondary | ICD-10-CM | POA: Insufficient documentation

## 2022-12-30 DIAGNOSIS — Z7902 Long term (current) use of antithrombotics/antiplatelets: Secondary | ICD-10-CM | POA: Diagnosis not present

## 2022-12-30 DIAGNOSIS — N184 Chronic kidney disease, stage 4 (severe): Secondary | ICD-10-CM | POA: Diagnosis not present

## 2022-12-30 DIAGNOSIS — Q2112 Patent foramen ovale: Secondary | ICD-10-CM | POA: Insufficient documentation

## 2022-12-30 DIAGNOSIS — I10 Essential (primary) hypertension: Secondary | ICD-10-CM

## 2022-12-30 DIAGNOSIS — I639 Cerebral infarction, unspecified: Secondary | ICD-10-CM | POA: Diagnosis not present

## 2022-12-30 DIAGNOSIS — R4701 Aphasia: Secondary | ICD-10-CM | POA: Diagnosis not present

## 2022-12-30 DIAGNOSIS — I129 Hypertensive chronic kidney disease with stage 1 through stage 4 chronic kidney disease, or unspecified chronic kidney disease: Secondary | ICD-10-CM | POA: Insufficient documentation

## 2022-12-30 HISTORY — PX: PATENT FORAMEN OVALE(PFO) CLOSURE: CATH118300

## 2022-12-30 LAB — POCT ACTIVATED CLOTTING TIME: Activated Clotting Time: 269 s

## 2022-12-30 LAB — ECHOCARDIOGRAM LIMITED

## 2022-12-30 SURGERY — PATENT FORAMEN OVALE (PFO) CLOSURE
Anesthesia: LOCAL

## 2022-12-30 MED ORDER — SODIUM CHLORIDE 0.9 % WEIGHT BASED INFUSION
3.0000 mL/kg/h | INTRAVENOUS | Status: AC
  Administered 2022-12-30: 3 mL/kg/h via INTRAVENOUS

## 2022-12-30 MED ORDER — LABETALOL HCL 5 MG/ML IV SOLN: 10.0000 mg | INTRAVENOUS | Status: DC | PRN

## 2022-12-30 MED ORDER — SODIUM CHLORIDE 0.9 % WEIGHT BASED INFUSION: 1.0000 mL/kg/h | INTRAVENOUS | Status: DC

## 2022-12-30 MED ORDER — HEPARIN (PORCINE) IN NACL 1000-0.9 UT/500ML-% IV SOLN
INTRAVENOUS | Status: DC | PRN
  Administered 2022-12-30 (×2): 500 mL

## 2022-12-30 MED ORDER — HEPARIN SODIUM (PORCINE) 1000 UNIT/ML IJ SOLN
INTRAMUSCULAR | Status: AC
  Filled 2022-12-30: qty 10

## 2022-12-30 MED ORDER — OXYCODONE HCL 5 MG PO TABS: 5.0000 mg | ORAL_TABLET | ORAL | Status: DC | PRN

## 2022-12-30 MED ORDER — FENTANYL CITRATE (PF) 100 MCG/2ML IJ SOLN
INTRAMUSCULAR | Status: DC | PRN
  Administered 2022-12-30: 25 ug via INTRAVENOUS

## 2022-12-30 MED ORDER — ONDANSETRON HCL 4 MG/2ML IJ SOLN: 4.0000 mg | Freq: Four times a day (QID) | INTRAMUSCULAR | Status: DC | PRN

## 2022-12-30 MED ORDER — LIDOCAINE-EPINEPHRINE 1 %-1:100000 IJ SOLN
INTRAMUSCULAR | Status: AC
  Filled 2022-12-30: qty 1

## 2022-12-30 MED ORDER — HEPARIN SODIUM (PORCINE) 1000 UNIT/ML IJ SOLN
INTRAMUSCULAR | Status: DC | PRN
  Administered 2022-12-30: 7000 [IU] via INTRAVENOUS

## 2022-12-30 MED ORDER — SODIUM CHLORIDE 0.9% FLUSH: 3.0000 mL | Freq: Two times a day (BID) | INTRAVENOUS | Status: DC

## 2022-12-30 MED ORDER — HYDRALAZINE HCL 20 MG/ML IJ SOLN: 10.0000 mg | INTRAMUSCULAR | Status: DC | PRN

## 2022-12-30 MED ORDER — SODIUM CHLORIDE 0.9% FLUSH: 3.0000 mL | INTRAVENOUS | Status: DC | PRN

## 2022-12-30 MED ORDER — ASPIRIN 81 MG PO TBEC
81.0000 mg | DELAYED_RELEASE_TABLET | Freq: Every day | ORAL | 2 refills | Status: AC
Start: 2022-12-30 — End: 2023-12-30

## 2022-12-30 MED ORDER — LIDOCAINE HCL (PF) 1 % IJ SOLN
INTRAMUSCULAR | Status: AC
  Filled 2022-12-30: qty 30

## 2022-12-30 MED ORDER — FENTANYL CITRATE (PF) 100 MCG/2ML IJ SOLN
INTRAMUSCULAR | Status: AC
  Filled 2022-12-30: qty 2

## 2022-12-30 MED ORDER — LIDOCAINE HCL (PF) 1 % IJ SOLN
INTRAMUSCULAR | Status: DC | PRN
  Administered 2022-12-30: 15 mL

## 2022-12-30 MED ORDER — SODIUM CHLORIDE 0.9 % IV SOLN: INTRAVENOUS | Status: DC

## 2022-12-30 MED ORDER — CEFAZOLIN SODIUM-DEXTROSE 2-3 GM-%(50ML) IV SOLR
INTRAVENOUS | Status: DC | PRN
  Administered 2022-12-30: 2 g via INTRAVENOUS

## 2022-12-30 MED ORDER — MIDAZOLAM HCL 2 MG/2ML IJ SOLN
INTRAMUSCULAR | Status: AC
  Filled 2022-12-30: qty 2

## 2022-12-30 MED ORDER — CEFAZOLIN SODIUM-DEXTROSE 2-4 GM/100ML-% IV SOLN
2.0000 g | INTRAVENOUS | Status: DC
  Filled 2022-12-30: qty 100

## 2022-12-30 MED ORDER — SODIUM CHLORIDE 0.9 % IV SOLN: 250.0000 mL | INTRAVENOUS | Status: DC | PRN

## 2022-12-30 MED ORDER — ACETAMINOPHEN 325 MG PO TABS: 650.0000 mg | ORAL_TABLET | ORAL | Status: DC | PRN

## 2022-12-30 MED ORDER — MIDAZOLAM HCL 2 MG/2ML IJ SOLN
INTRAMUSCULAR | Status: DC | PRN
  Administered 2022-12-30: 1 mg via INTRAVENOUS

## 2022-12-30 SURGICAL SUPPLY — 18 items
CATH 8FR ACUNAV REPROCESSED (CATHETERS) IMPLANT
CATH REPROCESSED 8FR ACUNAV (CATHETERS) ×1 IMPLANT
CATH SUPER TORQUE PLUS 6F MPA1 (CATHETERS) IMPLANT
CLOSURE PERCLOSE PROSTYLE (VASCULAR PRODUCTS) IMPLANT
GUIDEWIRE AMPLATZER 1.5JX260 (WIRE) IMPLANT
KIT MICROPUNCTURE NIT STIFF (SHEATH) IMPLANT
KIT SYRINGE INJ CVI SPIKEX1 (MISCELLANEOUS) IMPLANT
OCCLUDER AMPLATZER PFO 25MM (Prosthesis & Implant Heart) IMPLANT
PACK CARDIAC CATHETERIZATION (CUSTOM PROCEDURE TRAY) ×1 IMPLANT
SET ATX-X65L (MISCELLANEOUS) IMPLANT
SHEATH DELIVERY TALISMAN 8F 80 (SHEATH) IMPLANT
SHEATH INTROD W/O MIN 9FR 25CM (SHEATH) IMPLANT
SHEATH PINNACLE 8F 10CM (SHEATH) IMPLANT
SHEATH PROBE COVER 6X72 (BAG) IMPLANT
TALISMAN DELIVERY SHEATH 8F 80 (SHEATH) ×1
TRANSDUCER W/STOPCOCK (MISCELLANEOUS) ×1 IMPLANT
TUBING CIL FLEX 10 FLL-RA (TUBING) ×1 IMPLANT
WIRE EMERALD 3MM-J .035X150CM (WIRE) IMPLANT

## 2022-12-30 NOTE — Progress Notes (Signed)
  Echocardiogram 2D Echocardiogram has been performed.  [name] 12/30/2022, 12:26 PM

## 2022-12-30 NOTE — Interval H&P Note (Signed)
History and Physical Interval Note:  12/30/2022 7:45 AM  [name]  has presented today for surgery, with the diagnosis of patent foramen ovale.  The various methods of treatment have been discussed with the patient and family. After consideration of risks, benefits and other options for treatment, the patient has consented to  Procedure(s): PATENT FORAMEN OVALE(PFO) CLOSURE (N/A) as a surgical intervention.  The patient's history has been reviewed, patient examined, no change in status, stable for surgery.  I have reviewed the patient's chart and labs.  Questions were answered to the patient's satisfaction.     Tonny Bollman

## 2022-12-30 NOTE — Discharge Instructions (Signed)
You will require antibiotics prior to any dental work, including cleanings, for 6 months after your PFO/ASD closure. This is to protect the device from potentially getting infected from bacteria in your mouth entering your bloodstream. The medication will be called into your pharmacy at your 1 month follow up appointment. If you require dental work before that time, please call the office and it will be called into your pharmacy on file. Please pick this up to have ready before any scheduled dental work. Instructions will be outlined on the bottle.   Groin Site Care Refer to this sheet in the next few weeks. These instructions provide you with information on caring for yourself after your procedure. Your caregiver may also give you more specific instructions. Your treatment has been planned according to current medical practices, but problems sometimes occur. Call your caregiver if you have any problems or questions after your procedure. HOME CARE INSTRUCTIONS You may shower 24 hours after the procedure. Remove the bandage (dressing) and gently wash the site with plain soap and water. Gently pat the site dry.  Do not apply powder or lotion to the site.  Do not sit in a bathtub, swimming pool, or whirlpool for 5 to 7 days.  No bending, squatting, or lifting anything over 10 pounds (4.5 kg) for 1 week Inspect the site at least twice daily.  Do not drive home if you are discharged the same day of the procedure. Have someone else drive you.  You may drive 24 hours after the procedure unless otherwise instructed by your caregiver.  What to expect: Any bruising will usually fade within 1 to 2 weeks.  Blood that collects in the tissue (hematoma) may be painful to the touch. It should usually decrease in size and tenderness within 1 to 2 weeks.  SEEK IMMEDIATE MEDICAL CARE IF: You have unusual pain at the groin site or down the affected leg.  You have redness, warmth, swelling, or pain at the groin site.   You have drainage (other than a small amount of blood on the dressing).  You have chills.  You have a fever or persistent symptoms for more than 72 hours.  You have a fever and your symptoms suddenly get worse.  Your leg becomes pale, cool, tingly, or numb.  You have heavy bleeding from the site. Hold pressure on the site.  

## 2023-01-02 ENCOUNTER — Encounter (HOSPITAL_COMMUNITY): Payer: Self-pay | Admitting: Cardiovascular Disease

## 2023-01-02 ENCOUNTER — Ambulatory Visit: Payer: BC Managed Care – PPO | Admitting: Speech Pathology

## 2023-01-02 DIAGNOSIS — R4701 Aphasia: Secondary | ICD-10-CM

## 2023-01-02 DIAGNOSIS — I63512 Cerebral infarction due to unspecified occlusion or stenosis of left middle cerebral artery: Secondary | ICD-10-CM

## 2023-01-02 NOTE — Therapy (Signed)
OUTPATIENT SPEECH LANGUAGE PATHOLOGY  TREATMENT NOTE    Patient Name: [name] MRN: [id number] DOB:1966/03/18, 57 y.o., male Today's Date: 01/02/2023   PCP: Myrene Buddy, NP REFERRING PROVIDER: Mariam Dollar, PA    End of Session - 01/02/23 1058     Visit Number 77    Number of Visits 97    Date for SLP Re-Evaluation 01/26/23    Authorization Type [employer]    Progress Note Due on Visit 80    SLP Start Time 0845    SLP Stop Time  0930    SLP Time Calculation (min) 45 min    Activity Tolerance Patient tolerated treatment well              Past Medical History:  Diagnosis Date   Hypertension    LVH (left ventricular hypertrophy)    Myocardial infarction (HCC)    Vitamin D deficiency    Past Surgical History:  Procedure Laterality Date   ANTERIOR CRUCIATE LIGAMENT REPAIR     COLONOSCOPY WITH PROPOFOL N/A 04/11/2018   Procedure: COLONOSCOPY WITH PROPOFOL;  Surgeon: Toledo, Boykin Nearing, MD;  Location: [facility] ENDOSCOPY;  Service: Gastroenterology;  Laterality: N/A;   GASTRIC RESECTION     IR ANGIO INTRA EXTRACRAN SEL INTERNAL CAROTID UNI L MOD SED  08/15/2022   IR US GUIDE VASC ACCESS RIGHT  08/15/2022   LAPAROTOMY     LOOP RECORDER INSERTION N/A 08/18/2022   Procedure: LOOP RECORDER INSERTION;  Surgeon: Regan Lemming, MD;  Location: [facility] INVASIVE CV LAB;  Service: Cardiovascular;  Laterality: N/A;   RADIOLOGY WITH ANESTHESIA N/A 08/14/2022   Procedure: IR WITH ANESTHESIA;  Surgeon: Julieanne Cotton, MD;  Location: [facility] OR;  Service: Radiology;  Laterality: N/A;   TEE WITHOUT CARDIOVERSION N/A 08/18/2022   Procedure: TRANSESOPHAGEAL ECHOCARDIOGRAM;  Surgeon: [name], MD;  Location: [facility] INVASIVE CV LAB;  Service: Cardiovascular;  Laterality: N/A;   Patient Active Problem List   Diagnosis Date Noted   Essential hypertension, benign 08/18/2022   Hyperlipidemia 08/18/2022   DM (diabetes mellitus), type 2 (HCC) 08/18/2022   Obesity  (BMI 30-39.9) 08/18/2022   CAD (coronary artery disease) 08/18/2022   OSA (obstructive sleep apnea) 08/18/2022   Acute ischemic left MCA stroke (HCC) 08/18/2022   Acute ischemic right MCA stroke (HCC) 08/14/2022   Hypertensive urgency 04/27/2017   ONSET DATE: 08/14/2022; date of referral 08/23/2022    REFERRING DIAG: W09.811 (ICD-10-CM) - Cerebral infarction due to unspecified occlusion or stenosis of left middle cerebral artery   THERAPY DIAG:  Aphasia   Apraxia   Cerebral infarction due to unspecified occlusion or stenosis of left middle cerebral artery (HCC)   Rationale for Evaluation and Treatment Rehabilitation   SUBJECTIVE:      PERTINENT HISTORY:  [name] is 57 year old male who presented to the [facility] ED on 08/14/2022 noted by wife to be very weak and confused and halfway enter out of the bathtub at home. Symptoms were sudden in onset. Last known well 9 PM. Patient presented with global aphasia. Code stroke activated. CT head negative for hemorrhage, aspects of 9, mild hyperdensity left MCA could indicate thrombus. Tenecteplase administered. CTA showed left M1 occlusion and neurointerventional radiology contacted. Patient transferred to [facility] for thrombectomy. On route he was noted to have periorbital welts and uvular swelling. Was concern of possible angioedema secondary to tenecteplase. He was intubated for airway protection and underwent cerebral angiogram by Dr. Tommie Sams. Recanalization of the  left MCA vascular tree noted. Critical care medicine consulted for management and placed on low-dose Neo-Synephrine infusion. Extubated on 4/23. 2D echo with EF of approximately 60 to 65% with trivial MVR. LDL 96, hemoglobin A1c 6.3%. He was started on heparin subcutaneously for VTE prophylaxis.     DIAGNOSTIC FINDINGS:    CT Head - 08/14/2022 Mild hyperdensity of the left MCA   CT Head and Neck Angio - 08/14/2022 Proximal occlusion of the left MCA M1 segment  with poor collateralization throughout the MCA territory.   IR Angio - 08/15/2022 Interval recanalization of the left M1/MCA occlusion seen on prior CT angiogram likely as a result of TNK administration. Therefore, no intervention performed.   MRI 08/15/2022 Acute cortical infarcts along the left frontal operculum and left anterior insula with additional small area of acute cortical infarct within left parietal lobe. Other areas of faint cortical DWI hyperintensity throughout the left MCA territory, including the left putamen, may reflect additional areas of infarct or ischemia. 2. Loss of the left transverse sinus flow void is favored to reflect slow flow. If clinical suspicion for thrombosis, CT venography could be performed for further characterization.     PAIN:  Are you having pain? No   FALLS: Has patient fallen in last 6 months?  No   LIVING ENVIRONMENT: Lives with: lives with their family Lives in: House/apartment   PLOF:  Level of assistance: Independent with ADLs, Independent with IADLs Employment: Full-time employment     PATIENT GOALS    to be able to communicate again   SUBJECTIVE STATEMENT: Pt indicated that his procedure went well on Friday (use of gestures and verbal "ok, ok") Pt accompanied by: alone   OBJECTIVE:   TODAY'S TREATMENT:  Skilled treatment session focused on pt's communication goals. SLP facilitated session by providing the following interventions:    Instead of using a carrier phrase, SLP use written sentence completion targeting object picture to improve pt's object naming to 50%. Pt independent utilized Text to Speech app for verbal model to increase speech intelligibility during practice of word name after initial production.   PATIENT EDUCATION: Education details: see above Person educated: Patient and his wife via phone Education method: Explanation Education comprehension: verbalized understanding and needs further education    HOME EXERCISE PROGRAM:  Sent home object pictures and sentence completion task   GOALS:   Goals reviewed with patient? Yes   SHORT TERM GOALS: Target date: 10 sessions   SEE PREVIOUS NOTES FOR PROGRESS TOWARDS GOALS  UPDATED: 11/23/2022  UPDATED: 12/19/2022  5.  The patient will follow simple body commands presented auditorily at 80% accuracy given frequent maximal visual cues. Goal status: INITIAL:  MET   6.  With minimal cues, pt will demonstrate improved reading comprehension by following 2 step written directions with 75% accuracy.  Baseline:  Goal status: INITIAL: ONGOING  7.  With minimal cues, pt will type phrase level basic functional statements (3-4 words in length) in 5 out of 7 opportunities.  Baseline:  Goal status: INITIAL:GOOD PROGRESS MADE 3 OUT OF 7 OPPORTUNITIES  8.  With minimal cues, pt will imitate basic functional phrase level statements with 75% speech intelligibility.  Baseline:  Goal status: INITIAL: GREAT PROGRESS MADE  LONG TERM GOALS: Target date: 01/26/2023   SEE PREVIOUS NOTES FOR PROGRESS TOWARDS GOALS  UPDATED: 11/23/2022  UPDATED: 12/19/2022  Pt will use multimodal means of communication to express basic biographical information about himself with moderate assistance.  Baseline:  Goal status: INITIAL  Goal status: ONGOING; progress made; MET Updated Goal (11/03/2022): Pt will use multimodal means of communication to express basic biographical information about himself, activities within his day and his family with minimal assistance: ongoing    2.  Pt will demonstrate > 90% speech intelligibility when reading orientation information.  Baseline:  Goal status: INITIAL Goal status: ONGOING; progress made  3. Pt will answer semi-complex yes/no questions with > 75% accuracy given minimal cues.   Baseline: 75% with simple yes/no questions  Goal status: INITIAL: MET     ASSESSMENT:   CLINICAL IMPRESSION: Pt is a 57 year old right handed  male who was seen today for a speech-language treatment d/t moderate transcortical sensory aphasia. Will work towards potential functional return to work.  See treatment note for more details.   OBJECTIVE IMPAIRMENTS include expressive language, receptive language, apraxia, and reading comprehension and ability to produce written language . These impairments are limiting patient from return to work, managing medications, managing appointments, managing finances, household responsibilities, ADLs/IADLs, and effectively communicating at home and in community. Factors affecting potential to achieve goals and functional outcome are severity of impairments. Patient will benefit from skilled SLP services to address above impairments and improve overall function.   REHAB POTENTIAL: Excellent   PLAN: SLP FREQUENCY: 3x/week to 4xweek   SLP DURATION: 12 weeks   PLANNED INTERVENTIONS: Language facilitation, Cueing hierachy, Functional tasks, Multimodal communication approach, SLP instruction and feedback, Compensatory strategies, and Patient/family education     Gardiner Espana B. Dreama Saa, M.S., CCC-SLP, Tree surgeon Certified Brain Injury Specialist [facility]  [facility] Office [phone] Ascom [phone] Fax [phone]

## 2023-01-03 ENCOUNTER — Ambulatory Visit: Payer: BC Managed Care – PPO | Admitting: Speech Pathology

## 2023-01-03 DIAGNOSIS — I63512 Cerebral infarction due to unspecified occlusion or stenosis of left middle cerebral artery: Secondary | ICD-10-CM

## 2023-01-03 DIAGNOSIS — R482 Apraxia: Secondary | ICD-10-CM

## 2023-01-03 DIAGNOSIS — R4701 Aphasia: Secondary | ICD-10-CM | POA: Diagnosis not present

## 2023-01-03 NOTE — Therapy (Signed)
OUTPATIENT SPEECH LANGUAGE PATHOLOGY  TREATMENT NOTE    Patient Name: [name] MRN: [id number] DOB:Jan 05, 1966, 57 y.o., male Today's Date: 01/03/2023   PCP: Myrene Buddy, NP REFERRING PROVIDER: Mariam Dollar, PA    End of Session - 01/03/23 1437     Visit Number 78    Number of Visits 97    Date for SLP Re-Evaluation 01/26/23    Authorization Type [employer]    Progress Note Due on Visit 80    SLP Start Time 0845    SLP Stop Time  0930    SLP Time Calculation (min) 45 min    Activity Tolerance Patient tolerated treatment well              Past Medical History:  Diagnosis Date   Hypertension    LVH (left ventricular hypertrophy)    Myocardial infarction (HCC)    Vitamin D deficiency    Past Surgical History:  Procedure Laterality Date   ANTERIOR CRUCIATE LIGAMENT REPAIR     COLONOSCOPY WITH PROPOFOL N/A 04/11/2018   Procedure: COLONOSCOPY WITH PROPOFOL;  Surgeon: Toledo, Boykin Nearing, MD;  Location: [facility] ENDOSCOPY;  Service: Gastroenterology;  Laterality: N/A;   GASTRIC RESECTION     IR ANGIO INTRA EXTRACRAN SEL INTERNAL CAROTID UNI L MOD SED  08/15/2022   IR US GUIDE VASC ACCESS RIGHT  08/15/2022   LAPAROTOMY     LOOP RECORDER INSERTION N/A 08/18/2022   Procedure: LOOP RECORDER INSERTION;  Surgeon: Regan Lemming, MD;  Location: [facility] INVASIVE CV LAB;  Service: Cardiovascular;  Laterality: N/A;   RADIOLOGY WITH ANESTHESIA N/A 08/14/2022   Procedure: IR WITH ANESTHESIA;  Surgeon: Julieanne Cotton, MD;  Location: [facility] OR;  Service: Radiology;  Laterality: N/A;   TEE WITHOUT CARDIOVERSION N/A 08/18/2022   Procedure: TRANSESOPHAGEAL ECHOCARDIOGRAM;  Surgeon: [name], MD;  Location: [facility] INVASIVE CV LAB;  Service: Cardiovascular;  Laterality: N/A;   Patient Active Problem List   Diagnosis Date Noted   Essential hypertension, benign 08/18/2022   Hyperlipidemia 08/18/2022   DM (diabetes mellitus), type 2 (HCC) 08/18/2022   Obesity  (BMI 30-39.9) 08/18/2022   CAD (coronary artery disease) 08/18/2022   OSA (obstructive sleep apnea) 08/18/2022   Acute ischemic left MCA stroke (HCC) 08/18/2022   Acute ischemic right MCA stroke (HCC) 08/14/2022   Hypertensive urgency 04/27/2017   ONSET DATE: 08/14/2022; date of referral 08/23/2022    REFERRING DIAG: A21.308 (ICD-10-CM) - Cerebral infarction due to unspecified occlusion or stenosis of left middle cerebral artery   THERAPY DIAG:  Aphasia   Apraxia   Cerebral infarction due to unspecified occlusion or stenosis of left middle cerebral artery (HCC)   Rationale for Evaluation and Treatment Rehabilitation   SUBJECTIVE:      PERTINENT HISTORY:  [name] is 57 year old male who presented to the [facility] ED on 08/14/2022 noted by wife to be very weak and confused and halfway enter out of the bathtub at home. Symptoms were sudden in onset. Last known well 9 PM. Patient presented with global aphasia. Code stroke activated. CT head negative for hemorrhage, aspects of 9, mild hyperdensity left MCA could indicate thrombus. Tenecteplase administered. CTA showed left M1 occlusion and neurointerventional radiology contacted. Patient transferred to [facility] for thrombectomy. On route he was noted to have periorbital welts and uvular swelling. Was concern of possible angioedema secondary to tenecteplase. He was intubated for airway protection and underwent cerebral angiogram by Dr. Tommie Sams. Recanalization of the  left MCA vascular tree noted. Critical care medicine consulted for management and placed on low-dose Neo-Synephrine infusion. Extubated on 4/23. 2D echo with EF of approximately 60 to 65% with trivial MVR. LDL 96, hemoglobin A1c 6.3%. He was started on heparin subcutaneously for VTE prophylaxis.     DIAGNOSTIC FINDINGS:    CT Head - 08/14/2022 Mild hyperdensity of the left MCA   CT Head and Neck Angio - 08/14/2022 Proximal occlusion of the left MCA M1 segment  with poor collateralization throughout the MCA territory.   IR Angio - 08/15/2022 Interval recanalization of the left M1/MCA occlusion seen on prior CT angiogram likely as a result of TNK administration. Therefore, no intervention performed.   MRI 08/15/2022 Acute cortical infarcts along the left frontal operculum and left anterior insula with additional small area of acute cortical infarct within left parietal lobe. Other areas of faint cortical DWI hyperintensity throughout the left MCA territory, including the left putamen, may reflect additional areas of infarct or ischemia. 2. Loss of the left transverse sinus flow void is favored to reflect slow flow. If clinical suspicion for thrombosis, CT venography could be performed for further characterization.     PAIN:  Are you having pain? No   FALLS: Has patient fallen in last 6 months?  No   LIVING ENVIRONMENT: Lives with: lives with their family Lives in: House/apartment   PLOF:  Level of assistance: Independent with ADLs, Independent with IADLs Employment: Full-time employment     PATIENT GOALS    to be able to communicate again   SUBJECTIVE STATEMENT: Pt arrived with HEP and gestured that he had practiced with his wife Pt accompanied by: alone   OBJECTIVE:   TODAY'S TREATMENT:  Skilled treatment session focused on pt's communication goals. SLP facilitated session by providing the following interventions:  Pt able to name 75% of objects using sentence completion targeting object function After initial maximal written cues and demonstration, pt able to follow basic one-step verbal directions "Point to ____" x 10; able to follow one step directions related to body parts with 100%; able to follow 1 step directional body part directions with 100%  Pt indicated that he wanted to return to work and pulled up calendar on his cell phone pointing to October 1st - with SLP probes, pt able to indicate that he could gesture and  verbally state "you, stop, sit down"   PATIENT EDUCATION: Education details: see above Person educated: Patient and his wife via phone Education method: Explanation Education comprehension: verbalized understanding and needs further education   HOME EXERCISE PROGRAM:  Sent home object pictures and sentence completion task   GOALS:   Goals reviewed with patient? Yes   SHORT TERM GOALS: Target date: 10 sessions   SEE PREVIOUS NOTES FOR PROGRESS TOWARDS GOALS  UPDATED: 11/23/2022  UPDATED: 12/19/2022  5.  The patient will follow simple body commands presented auditorily at 80% accuracy given frequent maximal visual cues. Goal status: INITIAL:  MET   6.  With minimal cues, pt will demonstrate improved reading comprehension by following 2 step written directions with 75% accuracy.  Baseline:  Goal status: INITIAL: ONGOING  7.  With minimal cues, pt will type phrase level basic functional statements (3-4 words in length) in 5 out of 7 opportunities.  Baseline:  Goal status: INITIAL:GOOD PROGRESS MADE 3 OUT OF 7 OPPORTUNITIES  8.  With minimal cues, pt will imitate basic functional phrase level statements with 75% speech intelligibility.  Baseline:  Goal status: INITIAL:  GREAT PROGRESS MADE  LONG TERM GOALS: Target date: 01/26/2023   SEE PREVIOUS NOTES FOR PROGRESS TOWARDS GOALS  UPDATED: 11/23/2022  UPDATED: 12/19/2022  Pt will use multimodal means of communication to express basic biographical information about himself with moderate assistance.  Baseline:  Goal status: INITIAL Goal status: ONGOING; progress made; MET Updated Goal (11/03/2022): Pt will use multimodal means of communication to express basic biographical information about himself, activities within his day and his family with minimal assistance: ongoing    2.  Pt will demonstrate > 90% speech intelligibility when reading orientation information.  Baseline:  Goal status: INITIAL Goal status: ONGOING; progress  made  3. Pt will answer semi-complex yes/no questions with > 75% accuracy given minimal cues.   Baseline: 75% with simple yes/no questions  Goal status: INITIAL: MET     ASSESSMENT:   CLINICAL IMPRESSION: Pt is a 56 year old right handed male who was seen today for a speech-language treatment d/t moderate transcortical sensory aphasia. Pt continues to make progress towards verbal expression. This Clinical research associate call pt's wife following session (with pt's permission) to continue developing plan for possible return to work. This Clinical research associate will reach out for job description from pt's principal (permission has been provided by pt and his wife).   See treatment note for more details.   OBJECTIVE IMPAIRMENTS include expressive language, receptive language, apraxia, and reading comprehension and ability to produce written language . These impairments are limiting patient from return to work, managing medications, managing appointments, managing finances, household responsibilities, ADLs/IADLs, and effectively communicating at home and in community. Factors affecting potential to achieve goals and functional outcome are severity of impairments. Patient will benefit from skilled SLP services to address above impairments and improve overall function.   REHAB POTENTIAL: Excellent   PLAN: SLP FREQUENCY: 3x/week to 4xweek   SLP DURATION: 12 weeks   PLANNED INTERVENTIONS: Language facilitation, Cueing hierachy, Functional tasks, Multimodal communication approach, SLP instruction and feedback, Compensatory strategies, and Patient/family education     Jaydence Vanyo B. Dreama Saa, M.S., CCC-SLP, Tree surgeon Certified Brain Injury Specialist [facility]  [facility] Rehabilitation Services Office [phone] Ascom [phone] Fax [phone]

## 2023-01-04 ENCOUNTER — Ambulatory Visit: Payer: BC Managed Care – PPO | Admitting: Speech Pathology

## 2023-01-04 DIAGNOSIS — R482 Apraxia: Secondary | ICD-10-CM

## 2023-01-04 DIAGNOSIS — I63512 Cerebral infarction due to unspecified occlusion or stenosis of left middle cerebral artery: Secondary | ICD-10-CM

## 2023-01-04 DIAGNOSIS — R4701 Aphasia: Secondary | ICD-10-CM | POA: Diagnosis not present

## 2023-01-04 NOTE — Therapy (Signed)
OUTPATIENT SPEECH LANGUAGE PATHOLOGY  TREATMENT NOTE    Patient Name: [name] MRN: [id number] DOB:1965-08-09, 57 y.o., male Today's Date: 01/04/2023   PCP: Myrene Buddy, NP REFERRING PROVIDER: Mariam Dollar, PA    End of Session - 01/04/23 0838     Visit Number 79    Number of Visits 97    Date for SLP Re-Evaluation 01/26/23    Authorization Type [employer]    Progress Note Due on Visit 80    SLP Start Time 0800    SLP Stop Time  0845    SLP Time Calculation (min) 45 min    Activity Tolerance Patient tolerated treatment well              Past Medical History:  Diagnosis Date   Hypertension    LVH (left ventricular hypertrophy)    Myocardial infarction (HCC)    Vitamin D deficiency    Past Surgical History:  Procedure Laterality Date   ANTERIOR CRUCIATE LIGAMENT REPAIR     COLONOSCOPY WITH PROPOFOL N/A 04/11/2018   Procedure: COLONOSCOPY WITH PROPOFOL;  Surgeon: Toledo, Boykin Nearing, MD;  Location: [facility] ENDOSCOPY;  Service: Gastroenterology;  Laterality: N/A;   GASTRIC RESECTION     IR ANGIO INTRA EXTRACRAN SEL INTERNAL CAROTID UNI L MOD SED  08/15/2022   IR US GUIDE VASC ACCESS RIGHT  08/15/2022   LAPAROTOMY     LOOP RECORDER INSERTION N/A 08/18/2022   Procedure: LOOP RECORDER INSERTION;  Surgeon: Regan Lemming, MD;  Location: [facility] INVASIVE CV LAB;  Service: Cardiovascular;  Laterality: N/A;   RADIOLOGY WITH ANESTHESIA N/A 08/14/2022   Procedure: IR WITH ANESTHESIA;  Surgeon: Julieanne Cotton, MD;  Location: [facility] OR;  Service: Radiology;  Laterality: N/A;   TEE WITHOUT CARDIOVERSION N/A 08/18/2022   Procedure: TRANSESOPHAGEAL ECHOCARDIOGRAM;  Surgeon: [name], MD;  Location: [facility] INVASIVE CV LAB;  Service: Cardiovascular;  Laterality: N/A;   Patient Active Problem List   Diagnosis Date Noted   Essential hypertension, benign 08/18/2022   Hyperlipidemia 08/18/2022   DM (diabetes mellitus), type 2 (HCC) 08/18/2022   Obesity  (BMI 30-39.9) 08/18/2022   CAD (coronary artery disease) 08/18/2022   OSA (obstructive sleep apnea) 08/18/2022   Acute ischemic left MCA stroke (HCC) 08/18/2022   Acute ischemic right MCA stroke (HCC) 08/14/2022   Hypertensive urgency 04/27/2017   ONSET DATE: 08/14/2022; date of referral 08/23/2022    REFERRING DIAG: W09.811 (ICD-10-CM) - Cerebral infarction due to unspecified occlusion or stenosis of left middle cerebral artery   THERAPY DIAG:  Aphasia   Apraxia   Cerebral infarction due to unspecified occlusion or stenosis of left middle cerebral artery (HCC)   Rationale for Evaluation and Treatment Rehabilitation   SUBJECTIVE:      PERTINENT HISTORY:  [name] is 57 year old male who presented to the [facility] ED on 08/14/2022 noted by wife to be very weak and confused and halfway enter out of the bathtub at home. Symptoms were sudden in onset. Last known well 9 PM. Patient presented with global aphasia. Code stroke activated. CT head negative for hemorrhage, aspects of 9, mild hyperdensity left MCA could indicate thrombus. Tenecteplase administered. CTA showed left M1 occlusion and neurointerventional radiology contacted. Patient transferred to [facility] for thrombectomy. On route he was noted to have periorbital welts and uvular swelling. Was concern of possible angioedema secondary to tenecteplase. He was intubated for airway protection and underwent cerebral angiogram by Dr. Tommie Sams. Recanalization of the  left MCA vascular tree noted. Critical care medicine consulted for management and placed on low-dose Neo-Synephrine infusion. Extubated on 4/23. 2D echo with EF of approximately 60 to 65% with trivial MVR. LDL 96, hemoglobin A1c 6.3%. He was started on heparin subcutaneously for VTE prophylaxis.     DIAGNOSTIC FINDINGS:    CT Head - 08/14/2022 Mild hyperdensity of the left MCA   CT Head and Neck Angio - 08/14/2022 Proximal occlusion of the left MCA M1 segment  with poor collateralization throughout the MCA territory.   IR Angio - 08/15/2022 Interval recanalization of the left M1/MCA occlusion seen on prior CT angiogram likely as a result of TNK administration. Therefore, no intervention performed.   MRI 08/15/2022 Acute cortical infarcts along the left frontal operculum and left anterior insula with additional small area of acute cortical infarct within left parietal lobe. Other areas of faint cortical DWI hyperintensity throughout the left MCA territory, including the left putamen, may reflect additional areas of infarct or ischemia. 2. Loss of the left transverse sinus flow void is favored to reflect slow flow. If clinical suspicion for thrombosis, CT venography could be performed for further characterization.     PAIN:  Are you having pain? No   FALLS: Has patient fallen in last 6 months?  No   LIVING ENVIRONMENT: Lives with: lives with their family Lives in: House/apartment   PLOF:  Level of assistance: Independent with ADLs, Independent with IADLs Employment: Full-time employment     PATIENT GOALS    to be able to communicate again   SUBJECTIVE STATEMENT: Pt indicated that he was tired from staying up late watching recent Presidential debate Pt accompanied by: alone   OBJECTIVE:   TODAY'S TREATMENT:  Skilled treatment session focused on pt's communication goals. SLP facilitated session by providing the following interventions:  SLP attempted to elicit spontaneous conversation to simulate potential spontaneous conversations he would encounter within the work setting.   Pt wrote down "2am" indicating that he had stayed up late watching last night's presidential debate. He also wrote down "meidas" and gestured to [employer] computer. In addition he spoke "dog" but wrote down "cats." Through the conversation, pt verbally repeated statements "no more money," "I was running," and "jobs." A multimodal approach to improve  understanding was utilized by this Clinical research associate including predictive questions, written clarifying names etc.    PATIENT EDUCATION: Education details: see above Person educated: Patient and his wife via phone Education method: Explanation Education comprehension: verbalized understanding and needs further education   HOME EXERCISE PROGRAM:  Sent home object pictures and sentence completion task   GOALS:   Goals reviewed with patient? Yes   SHORT TERM GOALS: Target date: 10 sessions   SEE PREVIOUS NOTES FOR PROGRESS TOWARDS GOALS  UPDATED: 11/23/2022  UPDATED: 12/19/2022  5.  The patient will follow simple body commands presented auditorily at 80% accuracy given frequent maximal visual cues. Goal status: INITIAL:  MET   6.  With minimal cues, pt will demonstrate improved reading comprehension by following 2 step written directions with 75% accuracy.  Baseline:  Goal status: INITIAL: ONGOING  7.  With minimal cues, pt will type phrase level basic functional statements (3-4 words in length) in 5 out of 7 opportunities.  Baseline:  Goal status: INITIAL:GOOD PROGRESS MADE 3 OUT OF 7 OPPORTUNITIES  8.  With minimal cues, pt will imitate basic functional phrase level statements with 75% speech intelligibility.  Baseline:  Goal status: INITIAL: GREAT PROGRESS MADE  LONG TERM GOALS:  Target date: 01/26/2023   SEE PREVIOUS NOTES FOR PROGRESS TOWARDS GOALS  UPDATED: 11/23/2022  UPDATED: 12/19/2022  Pt will use multimodal means of communication to express basic biographical information about himself with moderate assistance.  Baseline:  Goal status: INITIAL Goal status: ONGOING; progress made; MET Updated Goal (11/03/2022): Pt will use multimodal means of communication to express basic biographical information about himself, activities within his day and his family with minimal assistance: ongoing    2.  Pt will demonstrate > 90% speech intelligibility when reading orientation  information.  Baseline:  Goal status: INITIAL Goal status: ONGOING; progress made  3. Pt will answer semi-complex yes/no questions with > 75% accuracy given minimal cues.   Baseline: 75% with simple yes/no questions  Goal status: INITIAL: MET     ASSESSMENT:   CLINICAL IMPRESSION: Pt is a 57 year old right handed male who was seen today for a speech-language treatment d/t moderate transcortical sensory aphasia. Pt's aphasia is c/b moderate expressive deficits, mild to moderate receptive deficits, mild to moderate deficits in reading comprehension, spelling and written language.   See treatment note for more details.   OBJECTIVE IMPAIRMENTS include expressive language, receptive language, apraxia, and reading comprehension and ability to produce written language . These impairments are limiting patient from return to work, managing medications, managing appointments, managing finances, household responsibilities, ADLs/IADLs, and effectively communicating at home and in community. Factors affecting potential to achieve goals and functional outcome are severity of impairments. Patient will benefit from skilled SLP services to address above impairments and improve overall function.   REHAB POTENTIAL: Excellent   PLAN: SLP FREQUENCY: 3x/week to 4xweek   SLP DURATION: 12 weeks   PLANNED INTERVENTIONS: Language facilitation, Cueing hierachy, Functional tasks, Multimodal communication approach, SLP instruction and feedback, Compensatory strategies, and Patient/family education     Celester Morgan B. Dreama Saa, M.S., CCC-SLP, Tree surgeon Certified Brain Injury Specialist [facility]  [facility] Office [phone] Ascom [phone] Fax [phone]

## 2023-01-05 ENCOUNTER — Ambulatory Visit: Payer: BC Managed Care – PPO | Admitting: Speech Pathology

## 2023-01-05 DIAGNOSIS — I63512 Cerebral infarction due to unspecified occlusion or stenosis of left middle cerebral artery: Secondary | ICD-10-CM

## 2023-01-05 DIAGNOSIS — R482 Apraxia: Secondary | ICD-10-CM

## 2023-01-05 DIAGNOSIS — R4701 Aphasia: Secondary | ICD-10-CM | POA: Diagnosis not present

## 2023-01-05 NOTE — Therapy (Signed)
OUTPATIENT SPEECH LANGUAGE PATHOLOGY  TREATMENT NOTE 10th treatment PROGRESS NOTE    Patient Name: [name] MRN: [id number] DOB:[birth year], 57 y.o., male Today's Date: 01/05/2023   PCP: Myrene Buddy, NP REFERRING PROVIDER: Mariam Dollar, PA    End of Session - 01/05/23 0804     Visit Number 80    Number of Visits 97    Date for SLP Re-Evaluation 01/26/23    Authorization Type [employer]    Progress Note Due on Visit 80    SLP Start Time 0800    SLP Stop Time  0845    SLP Time Calculation (min) 45 min    Activity Tolerance Patient tolerated treatment well              Past Medical History:  Diagnosis Date   Hypertension    LVH (left ventricular hypertrophy)    Myocardial infarction (HCC)    Vitamin D deficiency    Past Surgical History:  Procedure Laterality Date   ANTERIOR CRUCIATE LIGAMENT REPAIR     COLONOSCOPY WITH PROPOFOL N/A 04/11/2018   Procedure: COLONOSCOPY WITH PROPOFOL;  Surgeon: Toledo, Boykin Nearing, MD;  Location: [facility] ENDOSCOPY;  Service: Gastroenterology;  Laterality: N/A;   GASTRIC RESECTION     IR ANGIO INTRA EXTRACRAN SEL INTERNAL CAROTID UNI L MOD SED  08/15/2022   IR US GUIDE VASC ACCESS RIGHT  08/15/2022   LAPAROTOMY     LOOP RECORDER INSERTION N/A 08/18/2022   Procedure: LOOP RECORDER INSERTION;  Surgeon: Regan Lemming, MD;  Location: [facility] INVASIVE CV LAB;  Service: Cardiovascular;  Laterality: N/A;   RADIOLOGY WITH ANESTHESIA N/A 08/14/2022   Procedure: IR WITH ANESTHESIA;  Surgeon: Julieanne Cotton, MD;  Location: [facility] OR;  Service: Radiology;  Laterality: N/A;   TEE WITHOUT CARDIOVERSION N/A 08/18/2022   Procedure: TRANSESOPHAGEAL ECHOCARDIOGRAM;  Surgeon: [name], MD;  Location: [facility] INVASIVE CV LAB;  Service: Cardiovascular;  Laterality: N/A;   Patient Active Problem List   Diagnosis Date Noted   Essential hypertension, benign 08/18/2022   Hyperlipidemia 08/18/2022   DM (diabetes mellitus), type 2  (HCC) 08/18/2022   Obesity (BMI 30-39.9) 08/18/2022   CAD (coronary artery disease) 08/18/2022   OSA (obstructive sleep apnea) 08/18/2022   Acute ischemic left MCA stroke (HCC) 08/18/2022   Acute ischemic right MCA stroke (HCC) 08/14/2022   Hypertensive urgency 04/27/2017   ONSET DATE: 08/14/2022; date of referral 08/23/2022    REFERRING DIAG: X32.440 (ICD-10-CM) - Cerebral infarction due to unspecified occlusion or stenosis of left middle cerebral artery   THERAPY DIAG:  Aphasia   Apraxia   Cerebral infarction due to unspecified occlusion or stenosis of left middle cerebral artery (HCC)   Rationale for Evaluation and Treatment Rehabilitation   SUBJECTIVE:      PERTINENT HISTORY:  [name] is 57 year old male who presented to the [facility] ED on 08/14/2022 noted by wife to be very weak and confused and halfway enter out of the bathtub at home. Symptoms were sudden in onset. Last known well 9 PM. Patient presented with global aphasia. Code stroke activated. CT head negative for hemorrhage, aspects of 9, mild hyperdensity left MCA could indicate thrombus. Tenecteplase administered. CTA showed left M1 occlusion and neurointerventional radiology contacted. Patient transferred to [facility] for thrombectomy. On route he was noted to have periorbital welts and uvular swelling. Was concern of possible angioedema secondary to tenecteplase. He was intubated for airway protection and underwent cerebral angiogram by Dr. Joana Reamer  Quay Burow. Recanalization of the left MCA vascular tree noted. Critical care medicine consulted for management and placed on low-dose Neo-Synephrine infusion. Extubated on 4/23. 2D echo with EF of approximately 60 to 65% with trivial MVR. LDL 96, hemoglobin A1c 6.3%. He was started on heparin subcutaneously for VTE prophylaxis.     DIAGNOSTIC FINDINGS:    CT Head - 08/14/2022 Mild hyperdensity of the left MCA   CT Head and Neck Angio - 08/14/2022 Proximal occlusion  of the left MCA M1 segment with poor collateralization throughout the MCA territory.   IR Angio - 08/15/2022 Interval recanalization of the left M1/MCA occlusion seen on prior CT angiogram likely as a result of TNK administration. Therefore, no intervention performed.   MRI 08/15/2022 Acute cortical infarcts along the left frontal operculum and left anterior insula with additional small area of acute cortical infarct within left parietal lobe. Other areas of faint cortical DWI hyperintensity throughout the left MCA territory, including the left putamen, may reflect additional areas of infarct or ischemia. 2. Loss of the left transverse sinus flow void is favored to reflect slow flow. If clinical suspicion for thrombosis, CT venography could be performed for further characterization.     PAIN:  Are you having pain? No   FALLS: Has patient fallen in last 6 months?  No   LIVING ENVIRONMENT: Lives with: lives with their family Lives in: House/apartment   PLOF:  Level of assistance: Independent with ADLs, Independent with IADLs Employment: Full-time employment     PATIENT GOALS    to be able to communicate again   SUBJECTIVE STATEMENT: Pt gestured throughout session that it was difficult initiating motor pattern to begin words Pt accompanied by: alone   OBJECTIVE:   TODAY'S TREATMENT:  Skilled treatment session focused on pt's communication goals. SLP facilitated session by providing the following interventions:  SLP facilitated language and motor learning thru use of Talk[employer] Therapy App  Functional Repetition   Phrase Level - Level 1: 90% Phrase Level - Level 2: 90% with Min A cues include smaller words (such as articles) Phrase level - Level 3: 75% with Min A cues    PATIENT EDUCATION: Education details: see above Person educated: Patient and his wife via phone Education method: Explanation Education comprehension: verbalized understanding and needs further  education   HOME EXERCISE PROGRAM:  Sent home object pictures and sentence completion task   GOALS:   Goals reviewed with patient? Yes   SHORT TERM GOALS: Target date: 10 sessions   SEE PREVIOUS NOTES FOR PROGRESS TOWARDS GOALS  UPDATED: 11/23/2022  UPDATED: 12/19/2022  5.  The patient will follow simple body commands presented auditorily at 80% accuracy given frequent maximal visual cues. Goal status: INITIAL:  MET   6.  With minimal cues, pt will demonstrate improved reading comprehension by following 2 step written directions with 75% accuracy.  Baseline:  Goal status: INITIAL: ONGOING  7.  With minimal cues, pt will type phrase level basic functional statements (3-4 words in length) in 5 out of 7 opportunities.  Baseline:  Goal status: INITIAL:GOOD PROGRESS MADE 3 OUT OF 7 OPPORTUNITIES  8.  With minimal cues, pt will imitate basic functional phrase level statements with 75% speech intelligibility.  Baseline:  Goal status: INITIAL: GREAT PROGRESS MADE  LONG TERM GOALS: Target date: 01/26/2023   SEE PREVIOUS NOTES FOR PROGRESS TOWARDS GOALS  UPDATED: 11/23/2022  UPDATED: 12/19/2022  Pt will use multimodal means of communication to express basic biographical information about himself with moderate  assistance.  Baseline:  Goal status: INITIAL Goal status: ONGOING; progress made; MET Updated Goal (11/03/2022): Pt will use multimodal means of communication to express basic biographical information about himself, activities within his day and his family with minimal assistance: ongoing    2.  Pt will demonstrate > 90% speech intelligibility when reading orientation information.  Baseline:  Goal status: INITIAL Goal status: ONGOING; progress made  3. Pt will answer semi-complex yes/no questions with > 75% accuracy given minimal cues.   Baseline: 75% with simple yes/no questions  Goal status: INITIAL: MET     ASSESSMENT:   CLINICAL IMPRESSION: Pt is a 57 year old  right handed male who was seen today for a speech-language treatment d/t moderate transcortical sensory aphasia. Pt's aphasia is c/b moderate expressive deficits, mild to moderate receptive deficits, mild to moderate deficits in reading comprehension, spelling and written language.   Today's treatment session focused on pt's motor patterns. Pt continues to demonstrate increased awareness of speech/motor pattern errors. See treatment note for more details.   OBJECTIVE IMPAIRMENTS include expressive language, receptive language, apraxia, and reading comprehension and ability to produce written language . These impairments are limiting patient from return to work, managing medications, managing appointments, managing finances, household responsibilities, ADLs/IADLs, and effectively communicating at home and in community. Factors affecting potential to achieve goals and functional outcome are severity of impairments. Patient will benefit from skilled SLP services to address above impairments and improve overall function.   REHAB POTENTIAL: Excellent   PLAN: SLP FREQUENCY: 3x/week to 4xweek   SLP DURATION: 12 weeks   PLANNED INTERVENTIONS: Language facilitation, Cueing hierachy, Functional tasks, Multimodal communication approach, SLP instruction and feedback, Compensatory strategies, and Patient/family education     Ehan Freas B. Dreama Saa, M.S., CCC-SLP, Tree surgeon Certified Brain Injury Specialist [facility]  [facility] Services Office [phone] Ascom [phone] Fax [phone]

## 2023-01-06 LAB — CUP PACEART REMOTE DEVICE CHECK
Date Time Interrogation Session: 20240911170355
Implantable Pulse Generator Implant Date: 20240425

## 2023-01-09 ENCOUNTER — Ambulatory Visit (INDEPENDENT_AMBULATORY_CARE_PROVIDER_SITE_OTHER): Payer: BC Managed Care – PPO

## 2023-01-09 ENCOUNTER — Ambulatory Visit: Payer: BC Managed Care – PPO | Admitting: Speech Pathology

## 2023-01-09 DIAGNOSIS — R4701 Aphasia: Secondary | ICD-10-CM | POA: Diagnosis not present

## 2023-01-09 DIAGNOSIS — I63511 Cerebral infarction due to unspecified occlusion or stenosis of right middle cerebral artery: Secondary | ICD-10-CM

## 2023-01-09 DIAGNOSIS — R482 Apraxia: Secondary | ICD-10-CM

## 2023-01-09 DIAGNOSIS — I63512 Cerebral infarction due to unspecified occlusion or stenosis of left middle cerebral artery: Secondary | ICD-10-CM

## 2023-01-09 NOTE — Therapy (Unsigned)
OUTPATIENT SPEECH LANGUAGE PATHOLOGY  TREATMENT NOTE 10th treatment PROGRESS NOTE    Patient Name: [name] MRN: [id number] DOB:[birth year], 57 y.o., male Today's Date: 01/09/2023   PCP: Myrene Buddy, NP REFERRING PROVIDER: Mariam Dollar, PA    End of Session - 01/09/23 0854     Visit Number 81    Number of Visits 97    Date for SLP Re-Evaluation 01/26/23    Authorization Type [employer]    Progress Note Due on Visit 90    SLP Start Time 0845    SLP Stop Time  0930    SLP Time Calculation (min) 45 min    Activity Tolerance Patient tolerated treatment well              Past Medical History:  Diagnosis Date   Hypertension    LVH (left ventricular hypertrophy)    Myocardial infarction (HCC)    Vitamin D deficiency    Past Surgical History:  Procedure Laterality Date   ANTERIOR CRUCIATE LIGAMENT REPAIR     COLONOSCOPY WITH PROPOFOL N/A 04/11/2018   Procedure: COLONOSCOPY WITH PROPOFOL;  Surgeon: Toledo, Boykin Nearing, MD;  Location: [facility] ENDOSCOPY;  Service: Gastroenterology;  Laterality: N/A;   GASTRIC RESECTION     IR ANGIO INTRA EXTRACRAN SEL INTERNAL CAROTID UNI L MOD SED  08/15/2022   IR US GUIDE VASC ACCESS RIGHT  08/15/2022   LAPAROTOMY     LOOP RECORDER INSERTION N/A 08/18/2022   Procedure: LOOP RECORDER INSERTION;  Surgeon: Regan Lemming, MD;  Location: [facility] INVASIVE CV LAB;  Service: Cardiovascular;  Laterality: N/A;   RADIOLOGY WITH ANESTHESIA N/A 08/14/2022   Procedure: IR WITH ANESTHESIA;  Surgeon: Julieanne Cotton, MD;  Location: [facility] OR;  Service: Radiology;  Laterality: N/A;   TEE WITHOUT CARDIOVERSION N/A 08/18/2022   Procedure: TRANSESOPHAGEAL ECHOCARDIOGRAM;  Surgeon: [name], MD;  Location: [facility] INVASIVE CV LAB;  Service: Cardiovascular;  Laterality: N/A;   Patient Active Problem List   Diagnosis Date Noted   Essential hypertension, benign 08/18/2022   Hyperlipidemia 08/18/2022   DM (diabetes mellitus), type 2  (HCC) 08/18/2022   Obesity (BMI 30-39.9) 08/18/2022   CAD (coronary artery disease) 08/18/2022   OSA (obstructive sleep apnea) 08/18/2022   Acute ischemic left MCA stroke (HCC) 08/18/2022   Acute ischemic right MCA stroke (HCC) 08/14/2022   Hypertensive urgency 04/27/2017   ONSET DATE: 08/14/2022; date of referral 08/23/2022    REFERRING DIAG: N82.956 (ICD-10-CM) - Cerebral infarction due to unspecified occlusion or stenosis of left middle cerebral artery   THERAPY DIAG:  Aphasia   Apraxia   Cerebral infarction due to unspecified occlusion or stenosis of left middle cerebral artery (HCC)   Rationale for Evaluation and Treatment Rehabilitation   SUBJECTIVE:      PERTINENT HISTORY:  [name] is 57 year old male who presented to the [facility] ED on 08/14/2022 noted by wife to be very weak and confused and halfway enter out of the bathtub at home. Symptoms were sudden in onset. Last known well 9 PM. Patient presented with global aphasia. Code stroke activated. CT head negative for hemorrhage, aspects of 9, mild hyperdensity left MCA could indicate thrombus. Tenecteplase administered. CTA showed left M1 occlusion and neurointerventional radiology contacted. Patient transferred to [facility] for thrombectomy. On route he was noted to have periorbital welts and uvular swelling. Was concern of possible angioedema secondary to tenecteplase. He was intubated for airway protection and underwent cerebral angiogram by Dr. Joana Reamer  Quay Burow. Recanalization of the left MCA vascular tree noted. Critical care medicine consulted for management and placed on low-dose Neo-Synephrine infusion. Extubated on 4/23. 2D echo with EF of approximately 60 to 65% with trivial MVR. LDL 96, hemoglobin A1c 6.3%. He was started on heparin subcutaneously for VTE prophylaxis.     DIAGNOSTIC FINDINGS:    CT Head - 08/14/2022 Mild hyperdensity of the left MCA   CT Head and Neck Angio - 08/14/2022 Proximal occlusion  of the left MCA M1 segment with poor collateralization throughout the MCA territory.   IR Angio - 08/15/2022 Interval recanalization of the left M1/MCA occlusion seen on prior CT angiogram likely as a result of TNK administration. Therefore, no intervention performed.   MRI 08/15/2022 Acute cortical infarcts along the left frontal operculum and left anterior insula with additional small area of acute cortical infarct within left parietal lobe. Other areas of faint cortical DWI hyperintensity throughout the left MCA territory, including the left putamen, may reflect additional areas of infarct or ischemia. 2. Loss of the left transverse sinus flow void is favored to reflect slow flow. If clinical suspicion for thrombosis, CT venography could be performed for further characterization.     PAIN:  Are you having pain? No   FALLS: Has patient fallen in last 6 months?  No   LIVING ENVIRONMENT: Lives with: lives with their family Lives in: House/apartment   PLOF:  Level of assistance: Independent with ADLs, Independent with IADLs Employment: Full-time employment     PATIENT GOALS    to be able to communicate again   SUBJECTIVE STATEMENT: Pt gestured throughout session that it was difficult initiating motor pattern to begin words Pt accompanied by: alone   OBJECTIVE:   TODAY'S TREATMENT:  Skilled treatment session focused on pt's communication goals. SLP facilitated session by providing the following interventions:  SLP facilitated language and motor learning thru use of TalkPath Therapy App  Functional Repetition   Phrase Level - Level 1: 90% Phrase Level - Level 2: 90% with Min A cues include smaller words (such as articles) Phrase level - Level 3: 75% with Min A cues    PATIENT EDUCATION: Education details: see above Person educated: Patient and his wife via phone - via text regarding updated TalkPath Therapy app and pt took pictures and sent to his wife to help in  updating his app Education method: Explanation, pt took pictures Education comprehension: verbalized understanding and needs further education   HOME EXERCISE PROGRAM:  Complete TalkPath Therapy  Articulation videos - Conversation  Functional Repetition - Word Repetition - Level 4; Level 5  Functional Repetition - Phrase Repetition - Level 1, Level 2, Level 3   GOALS:   Goals reviewed with patient? Yes   SHORT TERM GOALS: Target date: 10 sessions   SEE PREVIOUS NOTES FOR PROGRESS TOWARDS GOALS  UPDATED: 11/23/2022  UPDATED: 12/19/2022  5.  The patient will follow simple body commands presented auditorily at 80% accuracy given frequent maximal visual cues. Goal status: INITIAL:  MET   6.  With minimal cues, pt will demonstrate improved reading comprehension by following 2 step written directions with 75% accuracy.  Baseline:  Goal status: INITIAL: ONGOING  7.  With minimal cues, pt will type phrase level basic functional statements (3-4 words in length) in 5 out of 7 opportunities.  Baseline:  Goal status: INITIAL:GOOD PROGRESS MADE 3 OUT OF 7 OPPORTUNITIES  8.  With minimal cues, pt will imitate basic functional phrase level statements with 75%  speech intelligibility.  Baseline:  Goal status: INITIAL: GREAT PROGRESS MADE  LONG TERM GOALS: Target date: 01/26/2023   SEE PREVIOUS NOTES FOR PROGRESS TOWARDS GOALS  UPDATED: 11/23/2022  UPDATED: 12/19/2022  Pt will use multimodal means of communication to express basic biographical information about himself with moderate assistance.  Baseline:  Goal status: INITIAL Goal status: ONGOING; progress made; MET Updated Goal (11/03/2022): Pt will use multimodal means of communication to express basic biographical information about himself, activities within his day and his family with minimal assistance: ongoing    2.  Pt will demonstrate > 90% speech intelligibility when reading orientation information.  Baseline:  Goal status:  INITIAL Goal status: ONGOING; progress made  3. Pt will answer semi-complex yes/no questions with > 75% accuracy given minimal cues.   Baseline: 75% with simple yes/no questions  Goal status: INITIAL: MET     ASSESSMENT:   CLINICAL IMPRESSION: Pt is a 57 year old right handed male who was seen today for a speech-language treatment d/t moderate transcortical sensory aphasia. Pt's aphasia is c/b moderate expressive deficits, mild to moderate receptive deficits, mild to moderate deficits in reading comprehension, spelling and written language.   Today's treatment session focused on pt's motor patterns. Pt continues to demonstrate increased awareness of speech/motor pattern errors. See treatment note for more details.   OBJECTIVE IMPAIRMENTS include expressive language, receptive language, apraxia, and reading comprehension and ability to produce written language . These impairments are limiting patient from return to work, managing medications, managing appointments, managing finances, household responsibilities, ADLs/IADLs, and effectively communicating at home and in community. Factors affecting potential to achieve goals and functional outcome are severity of impairments. Patient will benefit from skilled SLP services to address above impairments and improve overall function.   REHAB POTENTIAL: Excellent   PLAN: SLP FREQUENCY: 3x/week to 4xweek   SLP DURATION: 12 weeks   PLANNED INTERVENTIONS: Language facilitation, Cueing hierachy, Functional tasks, Multimodal communication approach, SLP instruction and feedback, Compensatory strategies, and Patient/family education     Rin Gorton B. Dreama Saa, M.S., CCC-SLP, Tree surgeon Certified Brain Injury Specialist [facility]  [facility] Rehabilitation Services Office [phone] Ascom [phone] Fax [phone]

## 2023-01-10 ENCOUNTER — Ambulatory Visit: Payer: BC Managed Care – PPO | Admitting: Speech Pathology

## 2023-01-10 DIAGNOSIS — I63512 Cerebral infarction due to unspecified occlusion or stenosis of left middle cerebral artery: Secondary | ICD-10-CM

## 2023-01-10 DIAGNOSIS — R4701 Aphasia: Secondary | ICD-10-CM | POA: Diagnosis not present

## 2023-01-10 DIAGNOSIS — R482 Apraxia: Secondary | ICD-10-CM

## 2023-01-10 NOTE — Therapy (Signed)
OUTPATIENT SPEECH LANGUAGE PATHOLOGY  TREATMENT NOTE    Patient Name: [name] MRN: [id number] DOB:29-Jul-1965, 57 y.o., male Today's Date: 01/10/2023   PCP: Myrene Buddy, NP REFERRING PROVIDER: Mariam Dollar, PA    End of Session - 01/10/23 0941     Visit Number 82    Number of Visits 97    Date for SLP Re-Evaluation 01/26/23    Authorization Type [employer]    Progress Note Due on Visit 90    SLP Start Time 0845    SLP Stop Time  0930    SLP Time Calculation (min) 45 min    Activity Tolerance Patient tolerated treatment well              Past Medical History:  Diagnosis Date   Hypertension    LVH (left ventricular hypertrophy)    Myocardial infarction (HCC)    Vitamin D deficiency    Past Surgical History:  Procedure Laterality Date   ANTERIOR CRUCIATE LIGAMENT REPAIR     COLONOSCOPY WITH PROPOFOL N/A 04/11/2018   Procedure: COLONOSCOPY WITH PROPOFOL;  Surgeon: Toledo, Boykin Nearing, MD;  Location: [facility] ENDOSCOPY;  Service: Gastroenterology;  Laterality: N/A;   GASTRIC RESECTION     IR ANGIO INTRA EXTRACRAN SEL INTERNAL CAROTID UNI L MOD SED  08/15/2022   IR US GUIDE VASC ACCESS RIGHT  08/15/2022   LAPAROTOMY     LOOP RECORDER INSERTION N/A 08/18/2022   Procedure: LOOP RECORDER INSERTION;  Surgeon: Regan Lemming, MD;  Location: [facility] INVASIVE CV LAB;  Service: Cardiovascular;  Laterality: N/A;   RADIOLOGY WITH ANESTHESIA N/A 08/14/2022   Procedure: IR WITH ANESTHESIA;  Surgeon: Julieanne Cotton, MD;  Location: [facility] OR;  Service: Radiology;  Laterality: N/A;   TEE WITHOUT CARDIOVERSION N/A 08/18/2022   Procedure: TRANSESOPHAGEAL ECHOCARDIOGRAM;  Surgeon: [name], MD;  Location: [facility] INVASIVE CV LAB;  Service: Cardiovascular;  Laterality: N/A;   Patient Active Problem List   Diagnosis Date Noted   Essential hypertension, benign 08/18/2022   Hyperlipidemia 08/18/2022   DM (diabetes mellitus), type 2 (HCC) 08/18/2022   Obesity  (BMI 30-39.9) 08/18/2022   CAD (coronary artery disease) 08/18/2022   OSA (obstructive sleep apnea) 08/18/2022   Acute ischemic left MCA stroke (HCC) 08/18/2022   Acute ischemic right MCA stroke (HCC) 08/14/2022   Hypertensive urgency 04/27/2017   ONSET DATE: 08/14/2022; date of referral 08/23/2022    REFERRING DIAG: Y78.295 (ICD-10-CM) - Cerebral infarction due to unspecified occlusion or stenosis of left middle cerebral artery   THERAPY DIAG:  Aphasia   Apraxia   Cerebral infarction due to unspecified occlusion or stenosis of left middle cerebral artery (HCC)   Rationale for Evaluation and Treatment Rehabilitation   SUBJECTIVE:      PERTINENT HISTORY:  [name] is 57 year old male who presented to the [facility] ED on 08/14/2022 noted by wife to be very weak and confused and halfway enter out of the bathtub at home. Symptoms were sudden in onset. Last known well 9 PM. Patient presented with global aphasia. Code stroke activated. CT head negative for hemorrhage, aspects of 9, mild hyperdensity left MCA could indicate thrombus. Tenecteplase administered. CTA showed left M1 occlusion and neurointerventional radiology contacted. Patient transferred to [facility] for thrombectomy. On route he was noted to have periorbital welts and uvular swelling. Was concern of possible angioedema secondary to tenecteplase. He was intubated for airway protection and underwent cerebral angiogram by Dr. Tommie Sams. Recanalization of the  left MCA vascular tree noted. Critical care medicine consulted for management and placed on low-dose Neo-Synephrine infusion. Extubated on 4/23. 2D echo with EF of approximately 60 to 65% with trivial MVR. LDL 96, hemoglobin A1c 6.3%. He was started on heparin subcutaneously for VTE prophylaxis.     DIAGNOSTIC FINDINGS:    CT Head - 08/14/2022 Mild hyperdensity of the left MCA   CT Head and Neck Angio - 08/14/2022 Proximal occlusion of the left MCA M1 segment  with poor collateralization throughout the MCA territory.   IR Angio - 08/15/2022 Interval recanalization of the left M1/MCA occlusion seen on prior CT angiogram likely as a result of TNK administration. Therefore, no intervention performed.   MRI 08/15/2022 Acute cortical infarcts along the left frontal operculum and left anterior insula with additional small area of acute cortical infarct within left parietal lobe. Other areas of faint cortical DWI hyperintensity throughout the left MCA territory, including the left putamen, may reflect additional areas of infarct or ischemia. 2. Loss of the left transverse sinus flow void is favored to reflect slow flow. If clinical suspicion for thrombosis, CT venography could be performed for further characterization.     PAIN:  Are you having pain? No   FALLS: Has patient fallen in last 6 months?  No   LIVING ENVIRONMENT: Lives with: lives with their family Lives in: House/apartment   PLOF:  Level of assistance: Independent with ADLs, Independent with IADLs Employment: Full-time employment     PATIENT GOALS    to be able to communicate again   SUBJECTIVE STATEMENT: Pt gestured throughout session that it was difficult initiating motor pattern to begin words Pt accompanied by: alone   OBJECTIVE:   TODAY'S TREATMENT:  Skilled treatment session focused on pt's communication goals. SLP facilitated session by providing the following interventions:  SLP facilitated language and motor learning thru use of TalkPath Therapy App  Functional Repetition   Phrase Level - Level 1: 90% Phrase Level - Level 2: 90% with Min A cues include smaller words (such as articles) Phrase level - Level 3: 75% with Min A cues   SLP also provided target word lists for /n/ within initial and medial position as well as functional common phrases containing possessives to prevent deletion of possessives (final /s/).    PATIENT EDUCATION: Education details:  see above Person educated: Patient and his wife via phone - via text regarding updated TalkPath Therapy app and pt took pictures and sent to his wife to help in updating his app Education method: Explanation, pt took pictures Education comprehension: verbalized understanding and needs further education   HOME EXERCISE PROGRAM:  Complete TalkPath Therapy  Articulation videos - Conversation  Functional Repetition - Word Repetition - Level 4; Level 5  Functional Repetition - Phrase Repetition - Level 1, Level 2, Level 3 Read/repeat functional phrases    GOALS:   Goals reviewed with patient? Yes   SHORT TERM GOALS: Target date: 10 sessions   SEE PREVIOUS NOTES FOR PROGRESS TOWARDS GOALS  UPDATED: 11/23/2022  UPDATED: 12/19/2022  5.  The patient will follow simple body commands presented auditorily at 80% accuracy given frequent maximal visual cues. Goal status: INITIAL:  MET   6.  With minimal cues, pt will demonstrate improved reading comprehension by following 2 step written directions with 75% accuracy.  Baseline:  Goal status: INITIAL: ONGOING  7.  With minimal cues, pt will type phrase level basic functional statements (3-4 words in length) in 5 out of 7  opportunities.  Baseline:  Goal status: INITIAL:GOOD PROGRESS MADE 3 OUT OF 7 OPPORTUNITIES  8.  With minimal cues, pt will imitate basic functional phrase level statements with 75% speech intelligibility.  Baseline:  Goal status: INITIAL: GREAT PROGRESS MADE  LONG TERM GOALS: Target date: 01/26/2023   SEE PREVIOUS NOTES FOR PROGRESS TOWARDS GOALS  UPDATED: 11/23/2022  UPDATED: 12/19/2022  Pt will use multimodal means of communication to express basic biographical information about himself with moderate assistance.  Baseline:  Goal status: INITIAL Goal status: ONGOING; progress made; MET Updated Goal (11/03/2022): Pt will use multimodal means of communication to express basic biographical information about himself,  activities within his day and his family with minimal assistance: ongoing    2.  Pt will demonstrate > 90% speech intelligibility when reading orientation information.  Baseline:  Goal status: INITIAL Goal status: ONGOING; progress made  3. Pt will answer semi-complex yes/no questions with > 75% accuracy given minimal cues.   Baseline: 75% with simple yes/no questions  Goal status: INITIAL: MET     ASSESSMENT:   CLINICAL IMPRESSION: Pt is a 57 year old right handed male who was seen today for a speech-language treatment d/t moderate transcortical sensory aphasia. Pt's aphasia is c/b moderate expressive deficits, mild to moderate receptive deficits, mild to moderate deficits in reading comprehension, spelling and written language.   Today's treatment session focused on pt's motor patterns. Pt continues to demonstrate improved motor patterns as well as increased awareness of speech/motor pattern errors. See treatment note for more details.   OBJECTIVE IMPAIRMENTS include expressive language, receptive language, apraxia, and reading comprehension and ability to produce written language . These impairments are limiting patient from return to work, managing medications, managing appointments, managing finances, household responsibilities, ADLs/IADLs, and effectively communicating at home and in community. Factors affecting potential to achieve goals and functional outcome are severity of impairments. Patient will benefit from skilled SLP services to address above impairments and improve overall function.   REHAB POTENTIAL: Excellent   PLAN: SLP FREQUENCY: 3x/week to 4xweek   SLP DURATION: 12 weeks   PLANNED INTERVENTIONS: Language facilitation, Cueing hierachy, Functional tasks, Multimodal communication approach, SLP instruction and feedback, Compensatory strategies, and Patient/family education     Tahesha Skeet B. Dreama Saa, M.S., CCC-SLP, Tree surgeon Certified Brain Injury  Specialist [facility]  [facility] Rehabilitation Services Office [phone] Ascom [phone] Fax [phone]

## 2023-01-11 ENCOUNTER — Ambulatory Visit: Payer: BC Managed Care – PPO | Admitting: Speech Pathology

## 2023-01-11 DIAGNOSIS — R4701 Aphasia: Secondary | ICD-10-CM | POA: Diagnosis not present

## 2023-01-11 DIAGNOSIS — I63512 Cerebral infarction due to unspecified occlusion or stenosis of left middle cerebral artery: Secondary | ICD-10-CM

## 2023-01-11 DIAGNOSIS — R482 Apraxia: Secondary | ICD-10-CM

## 2023-01-11 NOTE — Therapy (Signed)
OUTPATIENT SPEECH LANGUAGE PATHOLOGY  TREATMENT NOTE    Patient Name: [name] MRN: [id number] DOB:Aug 31, 1965, 57 y.o., male Today's Date: 01/11/2023   PCP: Myrene Buddy, NP REFERRING PROVIDER: Mariam Dollar, PA    End of Session - 01/11/23 0916     Visit Number 83    Number of Visits 97    Date for SLP Re-Evaluation 01/26/23    Authorization Type [employer]    Progress Note Due on Visit 90    SLP Start Time 0800    SLP Stop Time  0845    SLP Time Calculation (min) 45 min    Activity Tolerance Patient tolerated treatment well              Past Medical History:  Diagnosis Date   Hypertension    LVH (left ventricular hypertrophy)    Myocardial infarction (HCC)    Vitamin D deficiency    Past Surgical History:  Procedure Laterality Date   ANTERIOR CRUCIATE LIGAMENT REPAIR     COLONOSCOPY WITH PROPOFOL N/A 04/11/2018   Procedure: COLONOSCOPY WITH PROPOFOL;  Surgeon: Toledo, Boykin Nearing, MD;  Location: [facility] ENDOSCOPY;  Service: Gastroenterology;  Laterality: N/A;   GASTRIC RESECTION     IR ANGIO INTRA EXTRACRAN SEL INTERNAL CAROTID UNI L MOD SED  08/15/2022   IR US GUIDE VASC ACCESS RIGHT  08/15/2022   LAPAROTOMY     LOOP RECORDER INSERTION N/A 08/18/2022   Procedure: LOOP RECORDER INSERTION;  Surgeon: Regan Lemming, MD;  Location: [facility] INVASIVE CV LAB;  Service: Cardiovascular;  Laterality: N/A;   RADIOLOGY WITH ANESTHESIA N/A 08/14/2022   Procedure: IR WITH ANESTHESIA;  Surgeon: Julieanne Cotton, MD;  Location: [facility] OR;  Service: Radiology;  Laterality: N/A;   TEE WITHOUT CARDIOVERSION N/A 08/18/2022   Procedure: TRANSESOPHAGEAL ECHOCARDIOGRAM;  Surgeon: [name], MD;  Location: [facility] INVASIVE CV LAB;  Service: Cardiovascular;  Laterality: N/A;   Patient Active Problem List   Diagnosis Date Noted   Essential hypertension, benign 08/18/2022   Hyperlipidemia 08/18/2022   DM (diabetes mellitus), type 2 (HCC) 08/18/2022   Obesity  (BMI 30-39.9) 08/18/2022   CAD (coronary artery disease) 08/18/2022   OSA (obstructive sleep apnea) 08/18/2022   Acute ischemic left MCA stroke (HCC) 08/18/2022   Acute ischemic right MCA stroke (HCC) 08/14/2022   Hypertensive urgency 04/27/2017   ONSET DATE: 08/14/2022; date of referral 08/23/2022    REFERRING DIAG: P29.518 (ICD-10-CM) - Cerebral infarction due to unspecified occlusion or stenosis of left middle cerebral artery   THERAPY DIAG:  Aphasia   Apraxia   Cerebral infarction due to unspecified occlusion or stenosis of left middle cerebral artery (HCC)   Rationale for Evaluation and Treatment Rehabilitation   SUBJECTIVE:      PERTINENT HISTORY:  [name] is 57 year old male who presented to the [facility] ED on 08/14/2022 noted by wife to be very weak and confused and halfway enter out of the bathtub at home. Symptoms were sudden in onset. Last known well 9 PM. Patient presented with global aphasia. Code stroke activated. CT head negative for hemorrhage, aspects of 9, mild hyperdensity left MCA could indicate thrombus. Tenecteplase administered. CTA showed left M1 occlusion and neurointerventional radiology contacted. Patient transferred to [facility] for thrombectomy. On route he was noted to have periorbital welts and uvular swelling. Was concern of possible angioedema secondary to tenecteplase. He was intubated for airway protection and underwent cerebral angiogram by Dr. Tommie Sams. Recanalization of the  left MCA vascular tree noted. Critical care medicine consulted for management and placed on low-dose Neo-Synephrine infusion. Extubated on 4/23. 2D echo with EF of approximately 60 to 65% with trivial MVR. LDL 96, hemoglobin A1c 6.3%. He was started on heparin subcutaneously for VTE prophylaxis.     DIAGNOSTIC FINDINGS:    CT Head - 08/14/2022 Mild hyperdensity of the left MCA   CT Head and Neck Angio - 08/14/2022 Proximal occlusion of the left MCA M1 segment  with poor collateralization throughout the MCA territory.   IR Angio - 08/15/2022 Interval recanalization of the left M1/MCA occlusion seen on prior CT angiogram likely as a result of TNK administration. Therefore, no intervention performed.   MRI 08/15/2022 Acute cortical infarcts along the left frontal operculum and left anterior insula with additional small area of acute cortical infarct within left parietal lobe. Other areas of faint cortical DWI hyperintensity throughout the left MCA territory, including the left putamen, may reflect additional areas of infarct or ischemia. 2. Loss of the left transverse sinus flow void is favored to reflect slow flow. If clinical suspicion for thrombosis, CT venography could be performed for further characterization.     PAIN:  Are you having pain? No   FALLS: Has patient fallen in last 6 months?  No   LIVING ENVIRONMENT: Lives with: lives with their family Lives in: House/apartment   PLOF:  Level of assistance: Independent with ADLs, Independent with IADLs Employment: Full-time employment     PATIENT GOALS    to be able to communicate again   SUBJECTIVE STATEMENT: Pt was accompanied by his son [name], Session ended early as pt indicated he had to take his son to school, pt also communicates that "G" has been helping him practice his speech phrases.  Pt accompanied by: youngest son, [name]   OBJECTIVE:   TODAY'S TREATMENT:  Skilled treatment session focused on pt's communication goals. SLP facilitated session by providing the following interventions:  SLP facilitated language and motor learning thru use of TalkPath Therapy App  Functional Repetition  Word Level - Level 2: 17 out of 20 independently; Level 3 16 out of 20 independently Phrase Level - Level 2: 17 out of 20 independently    SLP also added additional phrases to target word lists for /n/ within initial and medial position as well as functional common phrases containing  possessives to prevent deletion of possessives (final /s/).    PATIENT EDUCATION: Education details: see above Person educated: Patient and his wife via phone - via text regarding updated TalkPath Therapy app and pt took pictures and sent to his wife to help in updating his app Education method: Explanation, pt took pictures Education comprehension: verbalized understanding and needs further education   HOME EXERCISE PROGRAM:  Complete TalkPath Therapy  Articulation videos - Conversation  Functional Repetition - Word Repetition - Level 4; Level 5  Functional Repetition - Phrase Repetition - Level 1, Level 2, Level 3 Read/repeat functional phrases    GOALS:   Goals reviewed with patient? Yes   SHORT TERM GOALS: Target date: 10 sessions   SEE PREVIOUS NOTES FOR PROGRESS TOWARDS GOALS  UPDATED: 11/23/2022  UPDATED: 12/19/2022  5.  The patient will follow simple body commands presented auditorily at 80% accuracy given frequent maximal visual cues. Goal status: INITIAL:  MET   6.  With minimal cues, pt will demonstrate improved reading comprehension by following 2 step written directions with 75% accuracy.  Baseline:  Goal status: INITIAL: ONGOING  7.  With minimal cues, pt will type phrase level basic functional statements (3-4 words in length) in 5 out of 7 opportunities.  Baseline:  Goal status: INITIAL:GOOD PROGRESS MADE 3 OUT OF 7 OPPORTUNITIES  8.  With minimal cues, pt will imitate basic functional phrase level statements with 75% speech intelligibility.  Baseline:  Goal status: INITIAL: GREAT PROGRESS MADE  LONG TERM GOALS: Target date: 01/26/2023   SEE PREVIOUS NOTES FOR PROGRESS TOWARDS GOALS  UPDATED: 11/23/2022  UPDATED: 12/19/2022  Pt will use multimodal means of communication to express basic biographical information about himself with moderate assistance.  Baseline:  Goal status: INITIAL Goal status: ONGOING; progress made; MET Updated Goal (11/03/2022): Pt  will use multimodal means of communication to express basic biographical information about himself, activities within his day and his family with minimal assistance: ongoing    2.  Pt will demonstrate > 90% speech intelligibility when reading orientation information.  Baseline:  Goal status: INITIAL Goal status: ONGOING; progress made  3. Pt will answer semi-complex yes/no questions with > 75% accuracy given minimal cues.   Baseline: 75% with simple yes/no questions  Goal status: INITIAL: MET     ASSESSMENT:   CLINICAL IMPRESSION: Pt is a 57 year old right handed male who was seen today for a speech-language treatment d/t moderate transcortical sensory aphasia. Pt's aphasia is c/b moderate expressive deficits, mild to moderate receptive deficits, mild to moderate deficits in reading comprehension, spelling and written language.   Treatment sessions continue to focus on pt's motor patterns. Pt continues to demonstrate improved motor patterns as well as increased awareness of speech/motor pattern errors. See treatment note for more details.   OBJECTIVE IMPAIRMENTS include expressive language, receptive language, apraxia, and reading comprehension and ability to produce written language . These impairments are limiting patient from return to work, managing medications, managing appointments, managing finances, household responsibilities, ADLs/IADLs, and effectively communicating at home and in community. Factors affecting potential to achieve goals and functional outcome are severity of impairments. Patient will benefit from skilled SLP services to address above impairments and improve overall function.   REHAB POTENTIAL: Excellent   PLAN: SLP FREQUENCY: 3x/week to 4xweek   SLP DURATION: 12 weeks   PLANNED INTERVENTIONS: Language facilitation, Cueing hierachy, Functional tasks, Multimodal communication approach, SLP instruction and feedback, Compensatory strategies, and Patient/family  education     Braian Tijerina B. Dreama Saa, M.S., CCC-SLP, Tree surgeon Certified Brain Injury Specialist [facility]  [facility] Rehabilitation Services Office [phone] Ascom [phone] Fax [phone]

## 2023-01-12 ENCOUNTER — Ambulatory Visit: Payer: BC Managed Care – PPO | Admitting: Speech Pathology

## 2023-01-12 DIAGNOSIS — I63512 Cerebral infarction due to unspecified occlusion or stenosis of left middle cerebral artery: Secondary | ICD-10-CM

## 2023-01-12 DIAGNOSIS — R4701 Aphasia: Secondary | ICD-10-CM | POA: Diagnosis not present

## 2023-01-12 DIAGNOSIS — R482 Apraxia: Secondary | ICD-10-CM

## 2023-01-12 NOTE — Therapy (Signed)
OUTPATIENT SPEECH LANGUAGE PATHOLOGY  TREATMENT NOTE    Patient Name: [name] MRN: [id number] DOB:[birth year], 57 y.o., male Today's Date: 01/13/2023   PCP: Jon Buddy, NP REFERRING PROVIDER: Mariam Dollar, PA    End of Session - 01/12/23 1513     Visit Number 84    Number of Visits 97    Date for SLP Re-Evaluation 01/26/23    Authorization Type [employer]    Progress Note Due on Visit 90    SLP Start Time 0800    SLP Stop Time  0845    SLP Time Calculation (min) 45 min    Activity Tolerance Patient tolerated treatment well              Past Medical History:  Diagnosis Date   Hypertension    LVH (left ventricular hypertrophy)    Myocardial infarction (HCC)    Vitamin D deficiency    Past Surgical History:  Procedure Laterality Date   ANTERIOR CRUCIATE LIGAMENT REPAIR     COLONOSCOPY WITH PROPOFOL N/A 04/11/2018   Procedure: COLONOSCOPY WITH PROPOFOL;  Surgeon: Toledo, Boykin Nearing, MD;  Location: [facility] ENDOSCOPY;  Service: Gastroenterology;  Laterality: N/A;   GASTRIC RESECTION     IR ANGIO INTRA EXTRACRAN SEL INTERNAL CAROTID UNI L MOD SED  08/15/2022   IR US GUIDE VASC ACCESS RIGHT  08/15/2022   LAPAROTOMY     LOOP RECORDER INSERTION N/A 08/18/2022   Procedure: LOOP RECORDER INSERTION;  Surgeon: Jon Lemming, MD;  Location: [facility] INVASIVE CV LAB;  Service: Cardiovascular;  Laterality: N/A;   RADIOLOGY WITH ANESTHESIA N/A 08/14/2022   Procedure: IR WITH ANESTHESIA;  Surgeon: Jon Cotton, MD;  Location: [facility] OR;  Service: Radiology;  Laterality: N/A;   TEE WITHOUT CARDIOVERSION N/A 08/18/2022   Procedure: TRANSESOPHAGEAL ECHOCARDIOGRAM;  Surgeon: Jon Fus, MD;  [facility] INVASIVE CV LAB;  Service: Cardiovascular;  Laterality: N/A;   Patient Active Problem List   Diagnosis Date Noted   Essential hypertension, benign 08/18/2022   Hyperlipidemia 08/18/2022   DM (diabetes mellitus), type 2 (HCC) 08/18/2022   Obesity  (BMI 30-39.9) 08/18/2022   CAD (coronary artery disease) 08/18/2022   OSA (obstructive sleep apnea) 08/18/2022   Acute ischemic left MCA stroke (HCC) 08/18/2022   Acute ischemic right MCA stroke (HCC) 08/14/2022   Hypertensive urgency 04/27/2017   ONSET DATE: 08/14/2022; date of referral 08/23/2022    REFERRING DIAG: U44.034 (ICD-10-CM) - Cerebral infarction due to unspecified occlusion or stenosis of left middle cerebral artery   THERAPY DIAG:  Aphasia   Apraxia   Cerebral infarction due to unspecified occlusion or stenosis of left middle cerebral artery (HCC)   Rationale for Evaluation and Treatment Rehabilitation   SUBJECTIVE:      PERTINENT HISTORY:  [name] is 57 year old male who presented to the [facility] ED on 08/14/2022 noted by wife to be very weak and confused and halfway enter out of the bathtub at home. Symptoms were sudden in onset. Last known well 9 PM. Patient presented with global aphasia. Code stroke activated. CT head negative for hemorrhage, aspects of 9, mild hyperdensity left MCA could indicate thrombus. Tenecteplase administered. CTA showed left M1 occlusion and neurointerventional radiology contacted. Patient transferred to [facility] for thrombectomy. On route he was noted to have periorbital welts and uvular swelling. Was concern of possible angioedema secondary to tenecteplase. He was intubated for airway protection and underwent cerebral angiogram by Dr. Tommie Warner. Recanalization of the  left MCA vascular tree noted. Critical care medicine consulted for management and placed on low-dose Neo-Synephrine infusion. Extubated on 4/23. 2D echo with EF of approximately 60 to 65% with trivial MVR. LDL 96, hemoglobin A1c 6.3%. He was started on heparin subcutaneously for VTE prophylaxis.     DIAGNOSTIC FINDINGS:    CT Head - 08/14/2022 Mild hyperdensity of the left MCA   CT Head and Neck Angio - 08/14/2022 Proximal occlusion of the left MCA M1 segment  with poor collateralization throughout the MCA territory.   IR Angio - 08/15/2022 Interval recanalization of the left M1/MCA occlusion seen on prior CT angiogram likely as a result of TNK administration. Therefore, no intervention performed.   MRI 08/15/2022 Acute cortical infarcts along the left frontal operculum and left anterior insula with additional small area of acute cortical infarct within left parietal lobe. Other areas of faint cortical DWI hyperintensity throughout the left MCA territory, including the left putamen, may reflect additional areas of infarct or ischemia. 2. Loss of the left transverse sinus flow void is favored to reflect slow flow. If clinical suspicion for thrombosis, CT venography could be performed for further characterization.     PAIN:  Are you having pain? No   FALLS: Has patient fallen in last 6 months?  No   LIVING ENVIRONMENT: Lives with: lives with their family Lives in: House/apartment   PLOF:  Level of assistance: Independent with ADLs, Independent with IADLs Employment: Full-time employment     PATIENT GOALS    to be able to communicate again   SUBJECTIVE STATEMENT: pt also communicates that "G" has been helping him practice his speech phrases and shows this Clinical research associate   Pt accompanied by: youngest son, [name]   OBJECTIVE:   TODAY'S TREATMENT:  Skilled treatment session focused on pt's communication goals. SLP facilitated session by providing the following interventions:  Targeted words list with s-blends in initial position. Pt with improved overall motor pattern  He reports that someone at work is helping him - this Clinical research associate wrote down phone number in event person "Ms G" had questions on how to facilitate HEP.  This Clinical research associate also spoke with pt's principal at work to gather additional information about work activities. Will target strategies to improve functional communication for potential return to work. Also spoke with pt's wife regarding  information obtained.   PATIENT EDUCATION: Education details: see above Person educated: Patient and his wife via phone -  Education method: Explanation, pt took pictures Education comprehension: verbalized understanding and needs further education   HOME EXERCISE PROGRAM:  Complete TalkPath Therapy  Articulation videos - Conversation  Functional Repetition - Word Repetition - Level 4; Level 5  Functional Repetition - Phrase Repetition - Level 1, Level 2, Level 3 Read/repeat functional phrases  Add sentences/phrases with s-blends   GOALS:   Goals reviewed with patient? Yes   SHORT TERM GOALS: Target date: 10 sessions   SEE PREVIOUS NOTES FOR PROGRESS TOWARDS GOALS  UPDATED: 11/23/2022  UPDATED: 12/19/2022  5.  The patient will follow simple body commands presented auditorily at 80% accuracy given frequent maximal visual cues. Goal status: INITIAL:  MET   6.  With minimal cues, pt will demonstrate improved reading comprehension by following 2 step written directions with 75% accuracy.  Baseline:  Goal status: INITIAL: ONGOING  7.  With minimal cues, pt will type phrase level basic functional statements (3-4 words in length) in 5 out of 7 opportunities.  Baseline:  Goal status: INITIAL:GOOD PROGRESS MADE 3  OUT OF 7 OPPORTUNITIES  8.  With minimal cues, pt will imitate basic functional phrase level statements with 75% speech intelligibility.  Baseline:  Goal status: INITIAL: GREAT PROGRESS MADE  LONG TERM GOALS: Target date: 01/26/2023   SEE PREVIOUS NOTES FOR PROGRESS TOWARDS GOALS  UPDATED: 11/23/2022  UPDATED: 12/19/2022  Pt will use multimodal means of communication to express basic biographical information about himself with moderate assistance.  Baseline:  Goal status: INITIAL Goal status: ONGOING; progress made; MET Updated Goal (11/03/2022): Pt will use multimodal means of communication to express basic biographical information about himself, activities within his  day and his family with minimal assistance: ongoing    2.  Pt will demonstrate > 90% speech intelligibility when reading orientation information.  Baseline:  Goal status: INITIAL Goal status: ONGOING; progress made  3. Pt will answer semi-complex yes/no questions with > 75% accuracy given minimal cues.   Baseline: 75% with simple yes/no questions  Goal status: INITIAL: MET     ASSESSMENT:   CLINICAL IMPRESSION: Pt is a 57 year old right handed male who was seen today for a speech-language treatment d/t moderate transcortical sensory aphasia. Pt's aphasia is c/b moderate expressive deficits, mild to moderate receptive deficits, mild to moderate deficits in reading comprehension, spelling and written language.   Treatment sessions continue to focus on pt's motor patterns. Pt continues to demonstrate improved motor patterns as well as increased awareness of speech/motor pattern errors with the ability to self-correct. See treatment note for more details.   OBJECTIVE IMPAIRMENTS include expressive language, receptive language, apraxia, and reading comprehension and ability to produce written language . These impairments are limiting patient from return to work, managing medications, managing appointments, managing finances, household responsibilities, ADLs/IADLs, and effectively communicating at home and in community. Factors affecting potential to achieve goals and functional outcome are severity of impairments. Patient will benefit from skilled SLP services to address above impairments and improve overall function.   REHAB POTENTIAL: Excellent   PLAN: SLP FREQUENCY: 3x/week to 4xweek   SLP DURATION: 12 weeks   PLANNED INTERVENTIONS: Language facilitation, Cueing hierachy, Functional tasks, Multimodal communication approach, SLP instruction and feedback, Compensatory strategies, and Patient/family education     Knox Cervi B. Dreama Saa, M.S., CCC-SLP, Research officer, trade union Certified Brain Injury Specialist [facility]  [facility] Rehabilitation Services Office [phone] Ascom [phone] Fax [phone]

## 2023-01-13 ENCOUNTER — Telehealth: Payer: Self-pay

## 2023-01-13 NOTE — Telephone Encounter (Signed)
Due to provider schedule change, called to reschedule the patient's 01/30/2023 visit with Georgie Chard.  Left message to call back.

## 2023-01-13 NOTE — Telephone Encounter (Signed)
The patient's wife called back and rescheduled the patient's visit to Friday, 02/03/2023 at 9:00AM. She was grateful for call and agreed with plan.

## 2023-01-16 ENCOUNTER — Ambulatory Visit: Payer: BC Managed Care – PPO | Admitting: Speech Pathology

## 2023-01-16 DIAGNOSIS — R4701 Aphasia: Secondary | ICD-10-CM

## 2023-01-16 DIAGNOSIS — I63512 Cerebral infarction due to unspecified occlusion or stenosis of left middle cerebral artery: Secondary | ICD-10-CM

## 2023-01-16 DIAGNOSIS — R482 Apraxia: Secondary | ICD-10-CM

## 2023-01-16 NOTE — Therapy (Signed)
OUTPATIENT SPEECH LANGUAGE PATHOLOGY  TREATMENT NOTE    Patient Name: [name] MRN: [id number] DOB:[birth year], 57 y.o., male Today's Date: 01/16/2023   PCP: Myrene Buddy, NP REFERRING PROVIDER: Mariam Dollar, PA    End of Session - 01/16/23 0849     Visit Number 85    Number of Visits 97    Date for SLP Re-Evaluation 01/26/23    Authorization Type [employer]    Progress Note Due on Visit 90    SLP Start Time 0845    SLP Stop Time  0930    SLP Time Calculation (min) 45 min    Activity Tolerance Patient tolerated treatment well              Past Medical History:  Diagnosis Date   Hypertension    LVH (left ventricular hypertrophy)    Myocardial infarction (HCC)    Vitamin D deficiency    Past Surgical History:  Procedure Laterality Date   ANTERIOR CRUCIATE LIGAMENT REPAIR     COLONOSCOPY WITH PROPOFOL N/A 04/11/2018   Procedure: COLONOSCOPY WITH PROPOFOL;  Surgeon: Toledo, Boykin Nearing, MD;  Location: [facility] ENDOSCOPY;  Service: Gastroenterology;  Laterality: N/A;   GASTRIC RESECTION     IR ANGIO INTRA EXTRACRAN SEL INTERNAL CAROTID UNI L MOD SED  08/15/2022   IR US GUIDE VASC ACCESS RIGHT  08/15/2022   LAPAROTOMY     LOOP RECORDER INSERTION N/A 08/18/2022   Procedure: LOOP RECORDER INSERTION;  Surgeon: Regan Lemming, MD;  Location: [facility] INVASIVE CV LAB;  Service: Cardiovascular;  Laterality: N/A;   RADIOLOGY WITH ANESTHESIA N/A 08/14/2022   Procedure: IR WITH ANESTHESIA;  Surgeon: Julieanne Cotton, MD;  Location: [facility] OR;  Service: Radiology;  Laterality: N/A;   TEE WITHOUT CARDIOVERSION N/A 08/18/2022   Procedure: TRANSESOPHAGEAL ECHOCARDIOGRAM;  Surgeon: [name], MD;  Location: [facility] INVASIVE CV LAB;  Service: Cardiovascular;  Laterality: N/A;   Patient Active Problem List   Diagnosis Date Noted   Essential hypertension, benign 08/18/2022   Hyperlipidemia 08/18/2022   DM (diabetes mellitus), type 2 (HCC) 08/18/2022   Obesity  (BMI 30-39.9) 08/18/2022   CAD (coronary artery disease) 08/18/2022   OSA (obstructive sleep apnea) 08/18/2022   Acute ischemic left MCA stroke (HCC) 08/18/2022   Acute ischemic right MCA stroke (HCC) 08/14/2022   Hypertensive urgency 04/27/2017   ONSET DATE: 08/14/2022; date of referral 08/23/2022    REFERRING DIAG: N82.956 (ICD-10-CM) - Cerebral infarction due to unspecified occlusion or stenosis of left middle cerebral artery   THERAPY DIAG:  Aphasia   Apraxia   Cerebral infarction due to unspecified occlusion or stenosis of left middle cerebral artery (HCC)   Rationale for Evaluation and Treatment Rehabilitation   SUBJECTIVE:      PERTINENT HISTORY:  [name] is 57 year old male who presented to the [facility] ED on 08/14/2022 noted by wife to be very weak and confused and halfway enter out of the bathtub at home. Symptoms were sudden in onset. Last known well 9 PM. Patient presented with global aphasia. Code stroke activated. CT head negative for hemorrhage, aspects of 9, mild hyperdensity left MCA could indicate thrombus. Tenecteplase administered. CTA showed left M1 occlusion and neurointerventional radiology contacted. Patient transferred to [facility] for thrombectomy. On route he was noted to have periorbital welts and uvular swelling. Was concern of possible angioedema secondary to tenecteplase. He was intubated for airway protection and underwent cerebral angiogram by Dr. Tommie Sams. Recanalization of the  left MCA vascular tree noted. Critical care medicine consulted for management and placed on low-dose Neo-Synephrine infusion. Extubated on 4/23. 2D echo with EF of approximately 60 to 65% with trivial MVR. LDL 96, hemoglobin A1c 6.3%. He was started on heparin subcutaneously for VTE prophylaxis.     DIAGNOSTIC FINDINGS:    CT Head - 08/14/2022 Mild hyperdensity of the left MCA   CT Head and Neck Angio - 08/14/2022 Proximal occlusion of the left MCA M1 segment  with poor collateralization throughout the MCA territory.   IR Angio - 08/15/2022 Interval recanalization of the left M1/MCA occlusion seen on prior CT angiogram likely as a result of TNK administration. Therefore, no intervention performed.   MRI 08/15/2022 Acute cortical infarcts along the left frontal operculum and left anterior insula with additional small area of acute cortical infarct within left parietal lobe. Other areas of faint cortical DWI hyperintensity throughout the left MCA territory, including the left putamen, may reflect additional areas of infarct or ischemia. 2. Loss of the left transverse sinus flow void is favored to reflect slow flow. If clinical suspicion for thrombosis, CT venography could be performed for further characterization.     PAIN:  Are you having pain? No   FALLS: Has patient fallen in last 6 months?  No   LIVING ENVIRONMENT: Lives with: lives with their family Lives in: House/apartment   PLOF:  Level of assistance: Independent with ADLs, Independent with IADLs Employment: Full-time employment     PATIENT GOALS    to be able to communicate again   SUBJECTIVE STATEMENT: Pt brought in his pictures Pt accompanied by: self   OBJECTIVE:   TODAY'S TREATMENT:  Skilled treatment session focused on pt's communication goals. SLP facilitated session by providing the following interventions:  Object naming thru use of pictures - pt struggled with independently naming each picture - he was able to independently type the names of each picture into text to speech app but was reliant on sentence completion and initial phonemic cues - when prompted to attempt verbal response pt state "I know but" pointing to his phone indicating that he was able to type the response  List of functional phrases pertaining to work were created. With verbal model, pt able to say each sentence with > 95% speech intelligibility 30 out of 37 opportunities improving to 37 out  of 37 with rare min A  PATIENT EDUCATION: Education details: see above Person educated: Patient and his wife via phone -  Education method: Explanation, pt took pictures Education comprehension: verbalized understanding and needs further education   HOME EXERCISE PROGRAM:  Complete TalkPath Therapy  Articulation videos - Conversation  Functional Repetition - Word Repetition - Level 4; Level 5  Functional Repetition - Phrase Repetition - Level 1, Level 2, Level 3 Read/repeat functional phrases  Add sentences/phrases with s-blends   GOALS:   Goals reviewed with patient? Yes   SHORT TERM GOALS: Target date: 10 sessions   SEE PREVIOUS NOTES FOR PROGRESS TOWARDS GOALS  UPDATED: 11/23/2022  UPDATED: 12/19/2022  5.  The patient will follow simple body commands presented auditorily at 80% accuracy given frequent maximal visual cues. Goal status: INITIAL:  MET   6.  With minimal cues, pt will demonstrate improved reading comprehension by following 2 step written directions with 75% accuracy.  Baseline:  Goal status: INITIAL: ONGOING  7.  With minimal cues, pt will type phrase level basic functional statements (3-4 words in length) in 5 out of 7 opportunities.  Baseline:  Goal status: INITIAL:GOOD PROGRESS MADE 3 OUT OF 7 OPPORTUNITIES  8.  With minimal cues, pt will imitate basic functional phrase level statements with 75% speech intelligibility.  Baseline:  Goal status: INITIAL: GREAT PROGRESS MADE  LONG TERM GOALS: Target date: 01/26/2023   SEE PREVIOUS NOTES FOR PROGRESS TOWARDS GOALS  UPDATED: 11/23/2022  UPDATED: 12/19/2022  Pt will use multimodal means of communication to express basic biographical information about himself with moderate assistance.  Baseline:  Goal status: INITIAL Goal status: ONGOING; progress made; MET Updated Goal (11/03/2022): Pt will use multimodal means of communication to express basic biographical information about himself, activities within his  day and his family with minimal assistance: ongoing    2.  Pt will demonstrate > 90% speech intelligibility when reading orientation information.  Baseline:  Goal status: INITIAL Goal status: ONGOING; progress made  3. Pt will answer semi-complex yes/no questions with > 75% accuracy given minimal cues.   Baseline: 75% with simple yes/no questions  Goal status: INITIAL: MET     ASSESSMENT:   CLINICAL IMPRESSION: Pt is a 57 year old right handed male who was seen today for a speech-language treatment d/t moderate transcortical sensory aphasia. Pt's aphasia is c/b moderate expressive deficits, mild to moderate receptive deficits, mild to moderate deficits in reading comprehension, spelling and written language.   Treatment sessions continue to focus on pt's motor patterns. Pt continues to demonstrate improved motor patterns as well as increased awareness of speech/motor pattern errors with the ability to self-correct. His facial expressions convey intense effort and focus. See treatment note for more details.   OBJECTIVE IMPAIRMENTS include expressive language, receptive language, apraxia, and reading comprehension and ability to produce written language . These impairments are limiting patient from return to work, managing medications, managing appointments, managing finances, household responsibilities, ADLs/IADLs, and effectively communicating at home and in community. Factors affecting potential to achieve goals and functional outcome are severity of impairments. Patient will benefit from skilled SLP services to address above impairments and improve overall function.   REHAB POTENTIAL: Excellent   PLAN: SLP FREQUENCY: 3x/week to 4xweek   SLP DURATION: 12 weeks   PLANNED INTERVENTIONS: Language facilitation, Cueing hierachy, Functional tasks, Multimodal communication approach, SLP instruction and feedback, Compensatory strategies, and Patient/family education     Lou Loewe B. Dreama Saa,  M.S., CCC-SLP, Tree surgeon Certified Brain Injury Specialist [facility]  [facility] Rehabilitation Services Office [phone] Ascom [phone] Fax [phone]

## 2023-01-17 ENCOUNTER — Ambulatory Visit: Payer: BC Managed Care – PPO | Admitting: Speech Pathology

## 2023-01-17 DIAGNOSIS — R4701 Aphasia: Secondary | ICD-10-CM

## 2023-01-17 DIAGNOSIS — R482 Apraxia: Secondary | ICD-10-CM

## 2023-01-17 DIAGNOSIS — I63512 Cerebral infarction due to unspecified occlusion or stenosis of left middle cerebral artery: Secondary | ICD-10-CM

## 2023-01-17 NOTE — Therapy (Signed)
OUTPATIENT SPEECH LANGUAGE PATHOLOGY  TREATMENT NOTE    Patient Name: [name] MRN: [id number] DOB:Aug 14, 1965, 57 y.o., male Today's Date: 01/17/2023   PCP: Myrene Buddy, NP REFERRING PROVIDER: Mariam Dollar, PA    End of Session - 01/17/23 0807     Visit Number 86    Number of Visits 97    Date for SLP Re-Evaluation 01/26/23    Authorization Type [employer]    Progress Note Due on Visit 90    SLP Start Time 0800    SLP Stop Time  0845    SLP Time Calculation (min) 45 min    Activity Tolerance Patient tolerated treatment well              Past Medical History:  Diagnosis Date   Hypertension    LVH (left ventricular hypertrophy)    Myocardial infarction (HCC)    Vitamin D deficiency    Past Surgical History:  Procedure Laterality Date   ANTERIOR CRUCIATE LIGAMENT REPAIR     COLONOSCOPY WITH PROPOFOL N/A 04/11/2018   Procedure: COLONOSCOPY WITH PROPOFOL;  Surgeon: Toledo, Boykin Nearing, MD;  Location: [facility] ENDOSCOPY;  Service: Gastroenterology;  Laterality: N/A;   GASTRIC RESECTION     IR ANGIO INTRA EXTRACRAN SEL INTERNAL CAROTID UNI L MOD SED  08/15/2022   IR US GUIDE VASC ACCESS RIGHT  08/15/2022   LAPAROTOMY     LOOP RECORDER INSERTION N/A 08/18/2022   Procedure: LOOP RECORDER INSERTION;  Surgeon: Regan Lemming, MD;  Location: [facility] INVASIVE CV LAB;  Service: Cardiovascular;  Laterality: N/A;   RADIOLOGY WITH ANESTHESIA N/A 08/14/2022   Procedure: IR WITH ANESTHESIA;  Surgeon: Julieanne Cotton, MD;  Location: [facility] OR;  Service: Radiology;  Laterality: N/A;   TEE WITHOUT CARDIOVERSION N/A 08/18/2022   Procedure: TRANSESOPHAGEAL ECHOCARDIOGRAM;  Surgeon: [name], MD;  Location: [facility] INVASIVE CV LAB;  Service: Cardiovascular;  Laterality: N/A;   Patient Active Problem List   Diagnosis Date Noted   Essential hypertension, benign 08/18/2022   Hyperlipidemia 08/18/2022   DM (diabetes mellitus), type 2 (HCC) 08/18/2022   Obesity  (BMI 30-39.9) 08/18/2022   CAD (coronary artery disease) 08/18/2022   OSA (obstructive sleep apnea) 08/18/2022   Acute ischemic left MCA stroke (HCC) 08/18/2022   Acute ischemic right MCA stroke (HCC) 08/14/2022   Hypertensive urgency 04/27/2017   ONSET DATE: 08/14/2022; date of referral 08/23/2022    REFERRING DIAG: W54.627 (ICD-10-CM) - Cerebral infarction due to unspecified occlusion or stenosis of left middle cerebral artery   THERAPY DIAG:  Aphasia   Apraxia   Cerebral infarction due to unspecified occlusion or stenosis of left middle cerebral artery (HCC)   Rationale for Evaluation and Treatment Rehabilitation   SUBJECTIVE:      PERTINENT HISTORY:  [name] is 57 year old male who presented to the [facility] ED on 08/14/2022 noted by wife to be very weak and confused and halfway enter out of the bathtub at home. Symptoms were sudden in onset. Last known well 9 PM. Patient presented with global aphasia. Code stroke activated. CT head negative for hemorrhage, aspects of 9, mild hyperdensity left MCA could indicate thrombus. Tenecteplase administered. CTA showed left M1 occlusion and neurointerventional radiology contacted. Patient transferred to [facility] for thrombectomy. On route he was noted to have periorbital welts and uvular swelling. Was concern of possible angioedema secondary to tenecteplase. He was intubated for airway protection and underwent cerebral angiogram by Dr. Tommie Sams. Recanalization of the  left MCA vascular tree noted. Critical care medicine consulted for management and placed on low-dose Neo-Synephrine infusion. Extubated on 4/23. 2D echo with EF of approximately 60 to 65% with trivial MVR. LDL 96, hemoglobin A1c 6.3%. He was started on heparin subcutaneously for VTE prophylaxis.     DIAGNOSTIC FINDINGS:    CT Head - 08/14/2022 Mild hyperdensity of the left MCA   CT Head and Neck Angio - 08/14/2022 Proximal occlusion of the left MCA M1 segment  with poor collateralization throughout the MCA territory.   IR Angio - 08/15/2022 Interval recanalization of the left M1/MCA occlusion seen on prior CT angiogram likely as a result of TNK administration. Therefore, no intervention performed.   MRI 08/15/2022 Acute cortical infarcts along the left frontal operculum and left anterior insula with additional small area of acute cortical infarct within left parietal lobe. Other areas of faint cortical DWI hyperintensity throughout the left MCA territory, including the left putamen, may reflect additional areas of infarct or ischemia. 2. Loss of the left transverse sinus flow void is favored to reflect slow flow. If clinical suspicion for thrombosis, CT venography could be performed for further characterization.     PAIN:  Are you having pain? No   FALLS: Has patient fallen in last 6 months?  No   LIVING ENVIRONMENT: Lives with: lives with their family Lives in: House/apartment   PLOF:  Level of assistance: Independent with ADLs, Independent with IADLs Employment: Full-time employment     PATIENT GOALS    to be able to communicate again   SUBJECTIVE STATEMENT: Pt brought in his pictures Pt accompanied by: self   OBJECTIVE:   TODAY'S TREATMENT:  Skilled treatment session focused on pt's communication goals. SLP facilitated session by providing the following interventions:  TalkPath Therapy App utilized for motor pattern/speech intelligibility  Speech - Functional Repetition - Level 4: and Level 5: >90% with pt self-monitoring and self-correctly  Speech - Functional Repetition - Level 3: 80% improving to > 95% with pt self-monitoring and self-correcting  PATIENT EDUCATION: Education details: see above Person educated: Patient and his wife via phone -  Education method: Explanation, pt took pictures Education comprehension: verbalized understanding and needs further education   HOME EXERCISE PROGRAM:  Complete TalkPath  Therapy  Articulation videos - Conversation  Functional Repetition - Word Repetition - Level 4; Level 5  Functional Repetition - Phrase Repetition - Level 1, Level 2, Level 3 Read/repeat functional phrases  Add sentences/phrases with s-blends   GOALS:   Goals reviewed with patient? Yes   SHORT TERM GOALS: Target date: 10 sessions   SEE PREVIOUS NOTES FOR PROGRESS TOWARDS GOALS  UPDATED: 11/23/2022  UPDATED: 12/19/2022  5.  The patient will follow simple body commands presented auditorily at 80% accuracy given frequent maximal visual cues. Goal status: INITIAL:  MET   6.  With minimal cues, pt will demonstrate improved reading comprehension by following 2 step written directions with 75% accuracy.  Baseline:  Goal status: INITIAL: ONGOING  7.  With minimal cues, pt will type phrase level basic functional statements (3-4 words in length) in 5 out of 7 opportunities.  Baseline:  Goal status: INITIAL:GOOD PROGRESS MADE 3 OUT OF 7 OPPORTUNITIES  8.  With minimal cues, pt will imitate basic functional phrase level statements with 75% speech intelligibility.  Baseline:  Goal status: INITIAL: GREAT PROGRESS MADE  LONG TERM GOALS: Target date: 01/26/2023   SEE PREVIOUS NOTES FOR PROGRESS TOWARDS GOALS  UPDATED: 11/23/2022  UPDATED: 12/19/2022  Pt will use multimodal means of communication to express basic biographical information about himself with moderate assistance.  Baseline:  Goal status: INITIAL Goal status: ONGOING; progress made; MET Updated Goal (11/03/2022): Pt will use multimodal means of communication to express basic biographical information about himself, activities within his day and his family with minimal assistance: ongoing    2.  Pt will demonstrate > 90% speech intelligibility when reading orientation information.  Baseline:  Goal status: INITIAL Goal status: ONGOING; progress made  3. Pt will answer semi-complex yes/no questions with > 75% accuracy given  minimal cues.   Baseline: 75% with simple yes/no questions  Goal status: INITIAL: MET     ASSESSMENT:   CLINICAL IMPRESSION: Pt is a 57 year old right handed male who was seen today for a speech-language treatment d/t moderate transcortical sensory aphasia. Pt's aphasia is c/b moderate expressive deficits, mild receptive deficits, mild to  deficits in reading comprehension, spelling and written language.   Treatment sessions continue to focus on pt's motor patterns. Pt continues to demonstrate improved motor patterns as well as increased awareness of speech/motor pattern errors with the ability to self-correct. His facial expressions convey intense effort and focus. See treatment note for more details.   OBJECTIVE IMPAIRMENTS include expressive language, receptive language, apraxia, and reading comprehension and ability to produce written language . These impairments are limiting patient from return to work, managing medications, managing appointments, managing finances, household responsibilities, ADLs/IADLs, and effectively communicating at home and in community. Factors affecting potential to achieve goals and functional outcome are severity of impairments. Patient will benefit from skilled SLP services to address above impairments and improve overall function.   REHAB POTENTIAL: Excellent   PLAN: SLP FREQUENCY: 3x/week to 4xweek   SLP DURATION: 12 weeks   PLANNED INTERVENTIONS: Language facilitation, Cueing hierachy, Functional tasks, Multimodal communication approach, SLP instruction and feedback, Compensatory strategies, and Patient/family education     Tiki Tucciarone B. Dreama Saa, M.S., CCC-SLP, Tree surgeon Certified Brain Injury Specialist [facility]  [facility] Rehabilitation Services Office [phone] Ascom [phone] Fax [phone]

## 2023-01-18 ENCOUNTER — Ambulatory Visit: Payer: BC Managed Care – PPO | Admitting: Speech Pathology

## 2023-01-18 DIAGNOSIS — R4701 Aphasia: Secondary | ICD-10-CM | POA: Diagnosis not present

## 2023-01-18 DIAGNOSIS — R482 Apraxia: Secondary | ICD-10-CM

## 2023-01-18 NOTE — Therapy (Signed)
OUTPATIENT SPEECH LANGUAGE PATHOLOGY  TREATMENT NOTE    Patient Name: [name] MRN: [id number] DOB:[birth year], 57 y.o., male Today's Date: 01/18/2023   PCP: Myrene Buddy, NP REFERRING PROVIDER: Mariam Dollar, PA    End of Session - 01/18/23 0804     Visit Number 87    Number of Visits 97    Date for SLP Re-Evaluation 01/26/23    Authorization Type [employer]    Progress Note Due on Visit 90    SLP Start Time 0800    SLP Stop Time  0845    SLP Time Calculation (min) 45 min    Activity Tolerance Patient tolerated treatment well              Past Medical History:  Diagnosis Date   Hypertension    LVH (left ventricular hypertrophy)    Myocardial infarction (HCC)    Vitamin D deficiency    Past Surgical History:  Procedure Laterality Date   ANTERIOR CRUCIATE LIGAMENT REPAIR     COLONOSCOPY WITH PROPOFOL N/A 04/11/2018   Procedure: COLONOSCOPY WITH PROPOFOL;  Surgeon: Toledo, Boykin Nearing, MD;  Location: [facility] ENDOSCOPY;  Service: Gastroenterology;  Laterality: N/A;   GASTRIC RESECTION     IR ANGIO INTRA EXTRACRAN SEL INTERNAL CAROTID UNI L MOD SED  08/15/2022   IR US GUIDE VASC ACCESS RIGHT  08/15/2022   LAPAROTOMY     LOOP RECORDER INSERTION N/A 08/18/2022   Procedure: LOOP RECORDER INSERTION;  Surgeon: Regan Lemming, MD;  [facility]: [facility] INVAS[facility] CV [facility];  Service: Cardiovascular;  Laterality: N/A;   RADIOLOGY WITH ANESTHESIA N/A 08/14/2022   Procedure: IR WITH ANESTHESIA;  Surgeon: Julieanne Cotton, MD;  Location: [facility] OR;  Service: Radiology;  Laterality: N/A;   TEE WITHOUT CARDIOVERSION N/A 08/18/2022   Procedure: TRANSESOPHAGEAL ECHOCARDIOGRAM;  Surgeon: [name], MD;  Location: [facility] INVASIVE CV LAB;  Service: Cardiovascular;  Laterality: N/A;   Patient Active Problem List   Diagnosis Date Noted   Essential hypertension, benign 08/18/2022   Hyperlipidemia 08/18/2022   DM (diabetes mellitus), type 2 (HCC) 08/18/2022   Obesity  (BMI 30-39.9) 08/18/2022   CAD (coronary artery disease) 08/18/2022   OSA (obstructive sleep apnea) 08/18/2022   Acute ischemic left MCA stroke (HCC) 08/18/2022   Acute ischemic right MCA stroke (HCC) 08/14/2022   Hypertensive urgency 04/27/2017   ONSET DATE: 08/14/2022; date of referral 08/23/2022    REFERRING DIAG: Z36.644 (ICD-10-CM) - Cerebral infarction due to unspecified occlusion or stenosis of left middle cerebral artery   THERAPY DIAG:  Aphasia   Apraxia   Cerebral infarction due to unspecified occlusion or stenosis of left middle cerebral artery (HCC)   Rationale for Evaluation and Treatment Rehabilitation   SUBJECTIVE:      PERTINENT HISTORY:  [name] is 57 year old male who presented to the [facility] ED on 08/14/2022 noted by wife to be very weak and confused and halfway enter out of the bathtub at home. Symptoms were sudden in onset. Last known well 9 PM. Patient presented with global aphasia. Code stroke activated. CT head negative for hemorrhage, aspects of 9, mild hyperdensity left MCA could indicate thrombus. Tenecteplase administered. CTA showed left M1 occlusion and neurointerventional radiology contacted. Patient transferred to [facility] for thrombectomy. On route he was noted to have periorbital welts and uvular swelling. Was concern of possible angioedema secondary to tenecteplase. He was intubated for airway protection and underwent cerebral angiogram by Dr. Tommie Sams. Recanalization of the  left MCA vascular tree noted. Critical care medicine consulted for management and placed on low-dose Neo-Synephrine infusion. Extubated on 4/23. 2D echo with EF of approximately 60 to 65% with trivial MVR. LDL 96, hemoglobin A1c 6.3%. He was started on heparin subcutaneously for VTE prophylaxis.     DIAGNOSTIC FINDINGS:    CT Head - 08/14/2022 Mild hyperdensity of the left MCA   CT Head and Neck Angio - 08/14/2022 Proximal occlusion of the left MCA M1 segment  with poor collateralization throughout the MCA territory.   IR Angio - 08/15/2022 Interval recanalization of the left M1/MCA occlusion seen on prior CT angiogram likely as a result of TNK administration. Therefore, no intervention performed.   MRI 08/15/2022 Acute cortical infarcts along the left frontal operculum and left anterior insula with additional small area of acute cortical infarct within left parietal lobe. Other areas of faint cortical DWI hyperintensity throughout the left MCA territory, including the left putamen, may reflect additional areas of infarct or ischemia. 2. Loss of the left transverse sinus flow void is favored to reflect slow flow. If clinical suspicion for thrombosis, CT venography could be performed for further characterization.     PAIN:  Are you having pain? No   FALLS: Has patient fallen in last 6 months?  No   LIVING ENVIRONMENT: Lives with: lives with their family Lives in: House/apartment   PLOF:  Level of assistance: Independent with ADLs, Independent with IADLs Employment: Full-time employment     PATIENT GOALS    to be able to communicate again   SUBJECTIVE STATEMENT: Pt with spontaneous conversation about death in athlete community Pt accompanied by: self   OBJECTIVE:   TODAY'S TREATMENT:  Skilled treatment session focused on pt's communication goals. SLP facilitated session by providing the following interventions:  TalkPath Therapy App utilized for motor pattern/speech intelligibility   Speech - Functional Repetition - Level 3: 80% improving to > 95% with pt self-monitoring and self-correcting  Level 4: 18 out of 20 improving to 20 out of 20 with rare Min A  Level 5 (consisting of 4-5 word sentences) - 9 out of 20 improving to 18 out of 20 with moderate to minimal A  PATIENT EDUCATION: Education details: see above Person educated: Patient and his wife via phone -  Education method: Explanation, pt took pictures Education  comprehension: verbalized understanding and needs further education   HOME EXERCISE PROGRAM:  Complete TalkPath Therapy  Articulation videos - Conversation  Functional Repetition - Word Repetition - Level 4; Level 5  Functional Repetition - Phrase Repetition - Level 1, Level 2, Level 3 Read/repeat functional phrases  Add sentences/phrases with s-blends   GOALS:   Goals reviewed with patient? Yes   SHORT TERM GOALS: Target date: 10 sessions   SEE PREVIOUS NOTES FOR PROGRESS TOWARDS GOALS  UPDATED: 11/23/2022  UPDATED: 12/19/2022  5.  The patient will follow simple body commands presented auditorily at 80% accuracy given frequent maximal visual cues. Goal status: INITIAL:  MET   6.  With minimal cues, pt will demonstrate improved reading comprehension by following 2 step written directions with 75% accuracy.  Baseline:  Goal status: INITIAL: ONGOING  7.  With minimal cues, pt will type phrase level basic functional statements (3-4 words in length) in 5 out of 7 opportunities.  Baseline:  Goal status: INITIAL:GOOD PROGRESS MADE 3 OUT OF 7 OPPORTUNITIES  8.  With minimal cues, pt will imitate basic functional phrase level statements with 75% speech intelligibility.  Baseline:  Goal status: INITIAL: GREAT PROGRESS MADE  LONG TERM GOALS: Target date: 01/26/2023   SEE PREVIOUS NOTES FOR PROGRESS TOWARDS GOALS  UPDATED: 11/23/2022  UPDATED: 12/19/2022  Pt will use multimodal means of communication to express basic biographical information about himself with moderate assistance.  Baseline:  Goal status: INITIAL Goal status: ONGOING; progress made; MET Updated Goal (11/03/2022): Pt will use multimodal means of communication to express basic biographical information about himself, activities within his day and his family with minimal assistance: ongoing    2.  Pt will demonstrate > 90% speech intelligibility when reading orientation information.  Baseline:  Goal status:  INITIAL Goal status: ONGOING; progress made  3. Pt will answer semi-complex yes/no questions with > 75% accuracy given minimal cues.   Baseline: 75% with simple yes/no questions  Goal status: INITIAL: MET     ASSESSMENT:   CLINICAL IMPRESSION: Pt is a 57 year old right handed male who was seen today for a speech-language treatment d/t moderate transcortical sensory aphasia. Pt's aphasia is c/b moderate expressive deficits, mild receptive deficits, mild to  deficits in reading comprehension, spelling and written language.   Pt continues to be eager and demonstrates improved speech intelligibility with each session. Today, he had 2 cogent verbal spontaneous sentences. Will continue facilitating possible return to work by utilizing functional sentences in sessions. See treatment note for more details.   OBJECTIVE IMPAIRMENTS include expressive language, receptive language, apraxia, and reading comprehension and ability to produce written language . These impairments are limiting patient from return to work, managing medications, managing appointments, managing finances, household responsibilities, ADLs/IADLs, and effectively communicating at home and in community. Factors affecting potential to achieve goals and functional outcome are severity of impairments. Patient will benefit from skilled SLP services to address above impairments and improve overall function.   REHAB POTENTIAL: Excellent   PLAN: SLP FREQUENCY: 3x/week to 4xweek   SLP DURATION: 12 weeks   PLANNED INTERVENTIONS: Language facilitation, Cueing hierachy, Functional tasks, Multimodal communication approach, SLP instruction and feedback, Compensatory strategies, and Patient/family education     Raechelle Sarti B. Dreama Saa, M.S., CCC-SLP, Tree surgeon Certified Brain Injury Specialist [facility]  [facility] Office [phone] Ascom [phone] Fax [phone]

## 2023-01-19 ENCOUNTER — Ambulatory Visit: Payer: BC Managed Care – PPO | Admitting: Speech Pathology

## 2023-01-19 DIAGNOSIS — R4701 Aphasia: Secondary | ICD-10-CM

## 2023-01-19 DIAGNOSIS — R482 Apraxia: Secondary | ICD-10-CM

## 2023-01-19 DIAGNOSIS — I63512 Cerebral infarction due to unspecified occlusion or stenosis of left middle cerebral artery: Secondary | ICD-10-CM

## 2023-01-19 NOTE — Therapy (Signed)
OUTPATIENT SPEECH LANGUAGE PATHOLOGY  TREATMENT NOTE    Patient Name: [name] MRN: [id number] DOB:[birth year], 57 y.o., male Today's Date: 01/19/2023   PCP: Jon Buddy, NP REFERRING PROVIDER: Mariam Dollar, PA    End of Session - 01/19/23 0842     Visit Number 88    Number of Visits 97    Date for SLP Re-Evaluation 01/26/23    Authorization Type [employer]    Progress Note Due on Visit 90    SLP Start Time 0800    SLP Stop Time  0845    SLP Time Calculation (min) 45 min    Activity Tolerance Patient tolerated treatment well              Past Medical History:  Diagnosis Date   Hypertension    LVH (left ventricular hypertrophy)    Myocardial infarction (HCC)    Vitamin D deficiency    Past Surgical History:  Procedure Laterality Date   ANTERIOR CRUCIATE LIGAMENT REPAIR     COLONOSCOPY WITH PROPOFOL N/A 04/11/2018   Procedure: COLONOSCOPY WITH PROPOFOL;  Surgeon: Toledo, Boykin Nearing, MD;  Location: [facility] ENDOSCOPY;  Service: Gastroenterology;  Laterality: N/A;   GASTRIC RESECTION     IR ANGIO INTRA EXTRACRAN SEL INTERNAL CAROTID UNI L MOD SED  08/15/2022   IR US GUIDE VASC ACCESS RIGHT  08/15/2022   LAPAROTOMY     LOOP RECORDER INSERTION N/A 08/18/2022   Procedure: LOOP RECORDER INSERTION;  Surgeon: Regan Lemming, MD;  Location: [facility] INVASIVE CV LAB;  Service: Cardiovascular;  Laterality: N/A;   RADIOLOGY WITH ANESTHESIA N/A 08/14/2022   Procedure: IR WITH ANESTHESIA;  Surgeon: Julieanne Cotton, MD;  Location: [facility] OR;  Service: Radiology;  Laterality: N/A;   TEE WITHOUT CARDIOVERSION N/A 08/18/2022   Procedure: TRANSESOPHAGEAL ECHOCARDIOGRAM;  Surgeon: [name], MD;  Location: [facility] INVASIVE CV LAB;  Service: Cardiovascular;  Laterality: N/A;   Patient Active Problem List   Diagnosis Date Noted   Essential hypertension, benign 08/18/2022   Hyperlipidemia 08/18/2022   DM (diabetes mellitus), type 2 (HCC) 08/18/2022   Obesity  (BMI 30-39.9) 08/18/2022   CAD (coronary artery disease) 08/18/2022   OSA (obstructive sleep apnea) 08/18/2022   Acute ischemic left MCA stroke (HCC) 08/18/2022   Acute ischemic right MCA stroke (HCC) 08/14/2022   Hypertensive urgency 04/27/2017   ONSET DATE: 08/14/2022; date of referral 08/23/2022    REFERRING DIAG: E33.295 (ICD-10-CM) - Cerebral infarction due to unspecified occlusion or stenosis of left middle cerebral artery   THERAPY DIAG:  Aphasia   Apraxia   Cerebral infarction due to unspecified occlusion or stenosis of left middle cerebral artery (HCC)   Rationale for Evaluation and Treatment Rehabilitation   SUBJECTIVE:      PERTINENT HISTORY:  [name] is 57 year old male who presented to the [facility] ED on 08/14/2022 noted by wife to be very weak and confused and halfway enter out of the bathtub at home. Symptoms were sudden in onset. Last known well 9 PM. Patient presented with global aphasia. Code stroke activated. CT head negative for hemorrhage, aspects of 9, mild hyperdensity left MCA could indicate thrombus. Tenecteplase administered. CTA showed left M1 occlusion and neurointerventional radiology contacted. Patient transferred to [facility] for thrombectomy. On route he was noted to have periorbital welts and uvular swelling. Was concern of possible angioedema secondary to tenecteplase. He was intubated for airway protection and underwent cerebral angiogram by Dr. Tommie Sams. Recanalization of the  left MCA vascular tree noted. Critical care medicine consulted for management and placed on low-dose Neo-Synephrine infusion. Extubated on 4/23. 2D echo with EF of approximately 60 to 65% with trivial MVR. LDL 96, hemoglobin A1c 6.3%. He was started on heparin subcutaneously for VTE prophylaxis.     DIAGNOSTIC FINDINGS:    CT Head - 08/14/2022 Mild hyperdensity of the left MCA   CT Head and Neck Angio - 08/14/2022 Proximal occlusion of the left MCA M1 segment  with poor collateralization throughout the MCA territory.   IR Angio - 08/15/2022 Interval recanalization of the left M1/MCA occlusion seen on prior CT angiogram likely as a result of TNK administration. Therefore, no intervention performed.   MRI 08/15/2022 Acute cortical infarcts along the left frontal operculum and left anterior insula with additional small area of acute cortical infarct within left parietal lobe. Other areas of faint cortical DWI hyperintensity throughout the left MCA territory, including the left putamen, may reflect additional areas of infarct or ischemia. 2. Loss of the left transverse sinus flow void is favored to reflect slow flow. If clinical suspicion for thrombosis, CT venography could be performed for further characterization.     PAIN:  Are you having pain? No   FALLS: Has patient fallen in last 6 months?  No   LIVING ENVIRONMENT: Lives with: lives with their family Lives in: House/apartment   PLOF:  Level of assistance: Independent with ADLs, Independent with IADLs Employment: Full-time employment     PATIENT GOALS    to be able to communicate again   SUBJECTIVE STATEMENT: Pt with spontaneous conversation about recent politics Pt accompanied by: self   OBJECTIVE:   TODAY'S TREATMENT:  Skilled treatment session focused on pt's communication goals. SLP facilitated session by providing the following interventions:  TalkPath Therapy App utilized for motor pattern/speech intelligibility Speech - Functional Repetition - Word Level 3:15 out 20 improving to 20 out of 20 with rare Min A  Word Level: Level 4 - 15 out of 20 improving to 20 out of 20 with rare Min A   Phrase Level 4: 18 out of 20 improving to 20 out of 20 with rare Min A  Level 5 (consisting of 4-5 word sentences) - 9 out of 20 improving to 18 out of 20 with minimal A  PATIENT EDUCATION: Education details: see above Person educated: Patient and his wife via phone -  Education  method: Explanation, pt took pictures Education comprehension: verbalized understanding and needs further education   HOME EXERCISE PROGRAM:  Complete TalkPath Therapy  Articulation videos - Conversation  Functional Repetition - Word Repetition - Level 4; Level 5  Functional Repetition - Phrase Repetition - Level 1, Level 2, Level 3 Read/repeat functional phrases  Add sentences/phrases with s-blends   GOALS:   Goals reviewed with patient? Yes   SHORT TERM GOALS: Target date: 10 sessions   SEE PREVIOUS NOTES FOR PROGRESS TOWARDS GOALS  UPDATED: 11/23/2022  UPDATED: 12/19/2022  5.  The patient will follow simple body commands presented auditorily at 80% accuracy given frequent maximal visual cues. Goal status: INITIAL:  MET   6.  With minimal cues, pt will demonstrate improved reading comprehension by following 2 step written directions with 75% accuracy.  Baseline:  Goal status: INITIAL: ONGOING  7.  With minimal cues, pt will type phrase level basic functional statements (3-4 words in length) in 5 out of 7 opportunities.  Baseline:  Goal status: INITIAL:GOOD PROGRESS MADE 3 OUT OF 7 OPPORTUNITIES  8.  With minimal cues, pt will imitate basic functional phrase level statements with 75% speech intelligibility.  Baseline:  Goal status: INITIAL: GREAT PROGRESS MADE  LONG TERM GOALS: Target date: 01/26/2023   SEE PREVIOUS NOTES FOR PROGRESS TOWARDS GOALS  UPDATED: 11/23/2022  UPDATED: 12/19/2022  Pt will use multimodal means of communication to express basic biographical information about himself with moderate assistance.  Baseline:  Goal status: INITIAL Goal status: ONGOING; progress made; MET Updated Goal (11/03/2022): Pt will use multimodal means of communication to express basic biographical information about himself, activities within his day and his family with minimal assistance: ongoing    2.  Pt will demonstrate > 90% speech intelligibility when reading orientation  information.  Baseline:  Goal status: INITIAL Goal status: ONGOING; progress made  3. Pt will answer semi-complex yes/no questions with > 75% accuracy given minimal cues.   Baseline: 75% with simple yes/no questions  Goal status: INITIAL: MET     ASSESSMENT:   CLINICAL IMPRESSION: Pt is a 57 year old right handed male who was seen today for a speech-language treatment d/t moderate transcortical sensory aphasia. Pt's aphasia is c/b moderate expressive deficits, mild receptive deficits, mild to  deficits in reading comprehension, spelling and written language.   Pt continues to be eager and demonstrates improved speech intelligibility with each session. During today's session, as new longer words/phrases were introduced, pt benefited from moderate verbal assistance to achieve accurate motor patterns.  See treatment note for more details.   OBJECTIVE IMPAIRMENTS include expressive language, receptive language, apraxia, and reading comprehension and ability to produce written language . These impairments are limiting patient from return to work, managing medications, managing appointments, managing finances, household responsibilities, ADLs/IADLs, and effectively communicating at home and in community. Factors affecting potential to achieve goals and functional outcome are severity of impairments. Patient will benefit from skilled SLP services to address above impairments and improve overall function.   REHAB POTENTIAL: Excellent   PLAN: SLP FREQUENCY: 3x/week to 4xweek   SLP DURATION: 12 weeks   PLANNED INTERVENTIONS: Language facilitation, Cueing hierachy, Functional tasks, Multimodal communication approach, SLP instruction and feedback, Compensatory strategies, and Patient/family education     Kirill Chatterjee B. Dreama Saa, M.S., CCC-SLP, Tree surgeon Certified Brain Injury Specialist [facility]  [facility] Office  [phone] Ascom [phone] Fax [phone]

## 2023-01-23 ENCOUNTER — Ambulatory Visit: Payer: BC Managed Care – PPO | Admitting: Speech Pathology

## 2023-01-23 DIAGNOSIS — R4701 Aphasia: Secondary | ICD-10-CM

## 2023-01-23 DIAGNOSIS — I63512 Cerebral infarction due to unspecified occlusion or stenosis of left middle cerebral artery: Secondary | ICD-10-CM

## 2023-01-23 DIAGNOSIS — R482 Apraxia: Secondary | ICD-10-CM

## 2023-01-23 NOTE — Progress Notes (Signed)
Carelink Summary Report / Loop Recorder 

## 2023-01-23 NOTE — Therapy (Signed)
OUTPATIENT SPEECH LANGUAGE PATHOLOGY  TREATMENT NOTE    Patient Name: [name] MRN: [id number] DOB:[birth year], 57 y.o., male Today's Date: 01/23/2023   PCP: Myrene Buddy, NP REFERRING PROVIDER: Mariam Dollar, PA    End of Session - 01/23/23 1251     Visit Number 89    Number of Visits 97    Date for SLP Re-Evaluation 01/26/23    Authorization Type [employer]    Progress Note Due on Visit 90    SLP Start Time 0845    SLP Stop Time  0930    SLP Time Calculation (min) 45 min    Activity Tolerance Patient tolerated treatment well              Past Medical History:  Diagnosis Date   Hypertension    LVH (left ventricular hypertrophy)    Myocardial infarction (HCC)    Vitamin D deficiency    Past Surgical History:  Procedure Laterality Date   ANTERIOR CRUCIATE LIGAMENT REPAIR     COLONOSCOPY WITH PROPOFOL N/A 04/11/2018   Procedure: COLONOSCOPY WITH PROPOFOL;  Surgeon: Toledo, Boykin Nearing, MD;  Location: [facility] ENDOSCOPY;  Service: Gastroenterology;  Laterality: N/A;   GASTRIC RESECTION     IR ANGIO INTRA EXTRACRAN SEL INTERNAL CAROTID UNI L MOD SED  08/15/2022   IR US GUIDE VASC ACCESS RIGHT  08/15/2022   LAPAROTOMY     LOOP RECORDER INSERTION N/A 08/18/2022   Procedure: LOOP RECORDER INSERTION;  Surgeon: Regan Lemming, MD;  Location: [facility] INVASIVE CV LAB;  Service: Cardiovascular;  Laterality: N/A;   RADIOLOGY WITH ANESTHESIA N/A 08/14/2022   Procedure: IR WITH ANESTHESIA;  Surgeon: Julieanne Cotton, MD;  Location: [facility] OR;  Service: Radiology;  Laterality: N/A;   TEE WITHOUT CARDIOVERSION N/A 08/18/2022   Procedure: TRANSESOPHAGEAL ECHOCARDIOGRAM;  Surgeon: [name], MD;  Location: [facility] INVASIVE CV LAB;  Service: Cardiovascular;  Laterality: N/A;   Patient Active Problem List   Diagnosis Date Noted   Essential hypertension, benign 08/18/2022   Hyperlipidemia 08/18/2022   DM (diabetes mellitus), type 2 (HCC) 08/18/2022   Obesity  (BMI 30-39.9) 08/18/2022   CAD (coronary artery disease) 08/18/2022   OSA (obstructive sleep apnea) 08/18/2022   Acute ischemic left MCA stroke (HCC) 08/18/2022   Acute ischemic right MCA stroke (HCC) 08/14/2022   Hypertensive urgency 04/27/2017   ONSET DATE: 08/14/2022; date of referral 08/23/2022    REFERRING DIAG: Q46.962 (ICD-10-CM) - Cerebral infarction due to unspecified occlusion or stenosis of left middle cerebral artery   THERAPY DIAG:  Aphasia   Apraxia   Cerebral infarction due to unspecified occlusion or stenosis of left middle cerebral artery (HCC)   Rationale for Evaluation and Treatment Rehabilitation   SUBJECTIVE:      PERTINENT HISTORY:  [name] is 57 year old male who presented to the [facility] ED on 08/14/2022 noted by wife to be very weak and confused and halfway enter out of the bathtub at home. Symptoms were sudden in onset. Last known well 9 PM. Patient presented with global aphasia. Code stroke activated. CT head negative for hemorrhage, aspects of 9, mild hyperdensity left MCA could indicate thrombus. Tenecteplase administered. CTA showed left M1 occlusion and neurointerventional radiology contacted. Patient transferred to [facility] for thrombectomy. On route he was noted to have periorbital welts and uvular swelling. Was concern of possible angioedema secondary to tenecteplase. He was intubated for airway protection and underwent cerebral angiogram by Dr. Tommie Sams. Recanalization of the  left MCA vascular tree noted. Critical care medicine consulted for management and placed on low-dose Neo-Synephrine infusion. Extubated on 4/23. 2D echo with EF of approximately 60 to 65% with trivial MVR. LDL 96, hemoglobin A1c 6.3%. He was started on heparin subcutaneously for VTE prophylaxis.     DIAGNOSTIC FINDINGS:    CT Head - 08/14/2022 Mild hyperdensity of the left MCA   CT Head and Neck Angio - 08/14/2022 Proximal occlusion of the left MCA M1 segment  with poor collateralization throughout the MCA territory.   IR Angio - 08/15/2022 Interval recanalization of the left M1/MCA occlusion seen on prior CT angiogram likely as a result of TNK administration. Therefore, no intervention performed.   MRI 08/15/2022 Acute cortical infarcts along the left frontal operculum and left anterior insula with additional small area of acute cortical infarct within left parietal lobe. Other areas of faint cortical DWI hyperintensity throughout the left MCA territory, including the left putamen, may reflect additional areas of infarct or ischemia. 2. Loss of the left transverse sinus flow void is favored to reflect slow flow. If clinical suspicion for thrombosis, CT venography could be performed for further characterization.     PAIN:  Are you having pain? No   FALLS: Has patient fallen in last 6 months?  No   LIVING ENVIRONMENT: Lives with: lives with their family Lives in: House/apartment   PLOF:  Level of assistance: Independent with ADLs, Independent with IADLs Employment: Full-time employment     PATIENT GOALS    to be able to communicate again   SUBJECTIVE STATEMENT: Pt will be going back to work tomorrow (January 24, 2023), pt indicating that he was happy about return to work Pt accompanied by: self   OBJECTIVE:   TODAY'S TREATMENT:  Skilled treatment session focused on pt's communication goals. SLP facilitated session by providing the following interventions:  TalkPath Therapy App utilized for motor pattern/speech intelligibility Functional Repetition -  Word Level: Level 5 - 12 out 20 improving to 20 out of 20 with moderate A - pt with emerging ability to establish correct motor patterns when self-correcting at the longer word level: 15 out o 20 improving to 20 out of 20 with Min A Functional Repetition: -  Phrase level 3 - 19 out of 20 Phrase level 4 - 13 out 20 improving to 20 out of 20 with Mod A     PATIENT  EDUCATION: Education details: see above Person educated: Patient and his wife via phone -  Education method: Explanation, pt took pictures Education comprehension: verbalized understanding and needs further education   HOME EXERCISE PROGRAM:  Complete TalkPath Therapy  Articulation videos - Conversation  Functional Repetition - Word Repetition - Level 4; Level 5  Functional Repetition - Phrase Repetition - Level 1, Level 2, Level 3 Read/repeat functional phrases  Add sentences/phrases with s-blends   GOALS:   Goals reviewed with patient? Yes   SHORT TERM GOALS: Target date: 10 sessions   SEE PREVIOUS NOTES FOR PROGRESS TOWARDS GOALS  UPDATED: 11/23/2022  UPDATED: 12/19/2022  5.  The patient will follow simple body commands presented auditorily at 80% accuracy given frequent maximal visual cues. Goal status: INITIAL:  MET   6.  With minimal cues, pt will demonstrate improved reading comprehension by following 2 step written directions with 75% accuracy.  Baseline:  Goal status: INITIAL: ONGOING  7.  With minimal cues, pt will type phrase level basic functional statements (3-4 words in length) in 5 out of 7 opportunities.  Baseline:  Goal status: INITIAL:GOOD PROGRESS MADE 3 OUT OF 7 OPPORTUNITIES  8.  With minimal cues, pt will imitate basic functional phrase level statements with 75% speech intelligibility.  Baseline:  Goal status: INITIAL: GREAT PROGRESS MADE  LONG TERM GOALS: Target date: 01/26/2023   SEE PREVIOUS NOTES FOR PROGRESS TOWARDS GOALS  UPDATED: 11/23/2022  UPDATED: 12/19/2022  Pt will use multimodal means of communication to express basic biographical information about himself with moderate assistance.  Baseline:  Goal status: INITIAL Goal status: ONGOING; progress made; MET Updated Goal (11/03/2022): Pt will use multimodal means of communication to express basic biographical information about himself, activities within his day and his family with minimal  assistance: ongoing    2.  Pt will demonstrate > 90% speech intelligibility when reading orientation information.  Baseline:  Goal status: INITIAL Goal status: ONGOING; progress made  3. Pt will answer semi-complex yes/no questions with > 75% accuracy given minimal cues.   Baseline: 75% with simple yes/no questions  Goal status: INITIAL: MET     ASSESSMENT:   CLINICAL IMPRESSION: Pt is a 57 year old right handed male who was seen today for a speech-language treatment d/t moderate transcortical sensory aphasia. Pt's aphasia is c/b moderate expressive deficits, mild receptive deficits, mild to  deficits in reading comprehension, spelling and written language.   Pt continues to demonstrate progress and as a result, he will be returning to work full-time. Will likely have to reduce number of sessions per week to accommodate but pt continues to benefit from therapy and continue to recommend it. As a result of possibly reducing his sessions, the TalkPath Therapy app was updated to promote carryover thru practice.  See treatment note for more details.   OBJECTIVE IMPAIRMENTS include expressive language, receptive language, apraxia, and reading comprehension and ability to produce written language . These impairments are limiting patient from return to work, managing medications, managing appointments, managing finances, household responsibilities, ADLs/IADLs, and effectively communicating at home and in community. Factors affecting potential to achieve goals and functional outcome are severity of impairments. Patient will benefit from skilled SLP services to address above impairments and improve overall function.   REHAB POTENTIAL: Excellent   PLAN: SLP FREQUENCY: 3x/week to 4xweek   SLP DURATION: 12 weeks   PLANNED INTERVENTIONS: Language facilitation, Cueing hierachy, Functional tasks, Multimodal communication approach, SLP instruction and feedback, Compensatory strategies, and  Patient/family education     Xerxes Agrusa B. Dreama Saa, M.S., CCC-SLP, Tree surgeon Certified Brain Injury Specialist [facility]  [facility] Rehabilitation Services Office [phone] Ascom [phone] Fax [phone]

## 2023-01-24 ENCOUNTER — Ambulatory Visit: Payer: BC Managed Care – PPO | Admitting: Speech Pathology

## 2023-01-25 ENCOUNTER — Ambulatory Visit: Payer: BC Managed Care – PPO | Admitting: Speech Pathology

## 2023-01-25 ENCOUNTER — Ambulatory Visit: Payer: BC Managed Care – PPO | Attending: Physician Assistant | Admitting: Speech Pathology

## 2023-01-25 DIAGNOSIS — I63512 Cerebral infarction due to unspecified occlusion or stenosis of left middle cerebral artery: Secondary | ICD-10-CM | POA: Diagnosis present

## 2023-01-25 DIAGNOSIS — R4701 Aphasia: Secondary | ICD-10-CM

## 2023-01-25 DIAGNOSIS — R482 Apraxia: Secondary | ICD-10-CM

## 2023-01-25 NOTE — Therapy (Signed)
OUTPATIENT SPEECH LANGUAGE PATHOLOGY  TREATMENT NOTE 10th VISIT PROGRESS NOTE RE-CERTIFICATION REQUEST    Patient Name: [name] MRN: [id number] DOB:1965/09/06, 57 y.o., male Today's Date: 01/25/2023   PCP: Myrene Buddy, NP REFERRING PROVIDER: Mariam Dollar, PA  Speech Therapy Progress Note  Dates of Reporting Period: 01/09/2023 to 01/25/2023  Objective: Patient has been seen for 10 speech therapy sessions this reporting period targeting mild to moderate aphasia. Patient is making progress toward LTGs and met 2 of 3 STGs this reporting period. See skilled intervention, clinical impressions, and goals below for details.    End of Session - 01/25/23 1619     Visit Number 90    Number of Visits 114    Date for SLP Re-Evaluation 04/19/23    Authorization Type [employer]    Progress Note Due on Visit 90    SLP Start Time 1530    SLP Stop Time  1615    SLP Time Calculation (min) 45 min    Activity Tolerance Patient tolerated treatment well              Past Medical History:  Diagnosis Date   Hypertension    LVH (left ventricular hypertrophy)    Myocardial infarction (HCC)    Vitamin D deficiency    Past Surgical History:  Procedure Laterality Date   ANTERIOR CRUCIATE LIGAMENT REPAIR     COLONOSCOPY WITH PROPOFOL N/A 04/11/2018   Procedure: COLONOSCOPY WITH PROPOFOL;  Surgeon: Toledo, Boykin Nearing, MD;  Location: [facility] ENDOSCOPY;  Service: Gastroenterology;  Laterality: N/A;   GASTRIC RESECTION     IR ANGIO INTRA EXTRACRAN SEL INTERNAL CAROTID UNI L MOD SED  08/15/2022   IR US GUIDE VASC ACCESS RIGHT  08/15/2022   LAPAROTOMY     LOOP RECORDER INSERTION N/A 08/18/2022   Procedure: LOOP RECORDER INSERTION;  Surgeon: Regan Lemming, MD;  Location: [facility] INVASIVE CV LAB;  Service: Cardiovascular;  Laterality: N/A;   RADIOLOGY WITH ANESTHESIA N/A 08/14/2022   Procedure: IR WITH ANESTHESIA;  Surgeon: Julieanne Cotton, MD;  Location: [facility] OR;   Service: Radiology;  Laterality: N/A;   TEE WITHOUT CARDIOVERSION N/A 08/18/2022   Procedure: TRANSESOPHAGEAL ECHOCARDIOGRAM;  Surgeon: [name], MD;  Location: [facility] INVASIVE CV LAB;  Service: Cardiovascular;  Laterality: N/A;   Patient Active Problem List   Diagnosis Date Noted   Essential hypertension, benign 08/18/2022   Hyperlipidemia 08/18/2022   DM (diabetes mellitus), type 2 (HCC) 08/18/2022   Obesity (BMI 30-39.9) 08/18/2022   CAD (coronary artery disease) 08/18/2022   OSA (obstructive sleep apnea) 08/18/2022   Acute ischemic left MCA stroke (HCC) 08/18/2022   Acute ischemic right MCA stroke (HCC) 08/14/2022   Hypertensive urgency 04/27/2017   ONSET DATE: 08/14/2022; date of referral 08/23/2022    REFERRING DIAG: W41.324 (ICD-10-CM) - Cerebral infarction due to unspecified occlusion or stenosis of left middle cerebral artery   THERAPY DIAG:  Aphasia   Apraxia   Cerebral infarction due to unspecified occlusion or stenosis of left middle cerebral artery (HCC)   Rationale for Evaluation and Treatment Rehabilitation   SUBJECTIVE:      PERTINENT HISTORY:  [name] is 57 year old male who presented to the [facility] ED on 08/14/2022 noted by wife to be very weak and confused and halfway enter out of the bathtub at home. Symptoms were sudden in onset. Last known well 9 PM. Patient presented with global aphasia. Code stroke activated. CT head negative for hemorrhage, aspects of 9,  mild hyperdensity left MCA could indicate thrombus. Tenecteplase administered. CTA showed left M1 occlusion and neurointerventional radiology contacted. Patient transferred to [facility] for thrombectomy. On route he was noted to have periorbital welts and uvular swelling. Was concern of possible angioedema secondary to tenecteplase. He was intubated for airway protection and underwent cerebral angiogram by Dr. Tommie Sams. Recanalization of the left MCA vascular tree noted. Critical care  medicine consulted for management and placed on low-dose Neo-Synephrine infusion. Extubated on 4/23. 2D echo with EF of approximately 60 to 65% with trivial MVR. LDL 96, hemoglobin A1c 6.3%. He was started on heparin subcutaneously for VTE prophylaxis.     DIAGNOSTIC FINDINGS:    CT Head - 08/14/2022 Mild hyperdensity of the left MCA   CT Head and Neck Angio - 08/14/2022 Proximal occlusion of the left MCA M1 segment with poor collateralization throughout the MCA territory.   IR Angio - 08/15/2022 Interval recanalization of the left M1/MCA occlusion seen on prior CT angiogram likely as a result of TNK administration. Therefore, no intervention performed.   MRI 08/15/2022 Acute cortical infarcts along the left frontal operculum and left anterior insula with additional small area of acute cortical infarct within left parietal lobe. Other areas of faint cortical DWI hyperintensity throughout the left MCA territory, including the left putamen, may reflect additional areas of infarct or ischemia. 2. Loss of the left transverse sinus flow void is favored to reflect slow flow. If clinical suspicion for thrombosis, CT venography could be performed for further characterization.     PAIN:  Are you having pain? No   FALLS: Has patient fallen in last 6 months?  No   LIVING ENVIRONMENT: Lives with: lives with their family Lives in: House/apartment   PLOF:  Level of assistance: Independent with ADLs, Independent with IADLs Employment: Full-time employment     PATIENT GOALS    to be able to communicate again   SUBJECTIVE STATEMENT: Pt arrived after work - pt returned to work on 01/24/2023 Pt accompanied by: self   OBJECTIVE:   TODAY'S TREATMENT:  Skilled treatment session focused on pt's communication goals. 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 Yes   SHORT TERM GOALS: Target date: 10 sessions   SEE PREVIOUS NOTES FOR PROGRESS TOWARDS GOALS  Updated 01/25/2023 6.  With minimal cues, pt will demonstrate improved reading comprehension by following 2 step written directions with 75% accuracy.  Baseline:  Goal status: INITIAL: MET  7.  With minimal cues,  pt will type phrase level basic functional statements (3-4 words in length) in 5 out of 7 opportunities.  Baseline:  Goal status: INITIAL:GOOD PROGRESS MADE 3 OUT OF 7 OPPORTUNITIES typing 2-3 word length  8.  With minimal cues, pt will imitate basic functional phrase level statements with 75% speech intelligibility.  Baseline:  Goal status: INITIAL: GREAT PROGRESS MADE: MET  LONG TERM GOALS: Target date: 01/26/2023   SEE PREVIOUS NOTES FOR PROGRESS TOWARDS GOALS  Updated 01/25/2023  Pt will use multimodal means of communication to express basic biographical information about himself, activities within his day and his family with minimal assistance:  ongoing    2.  Pt will demonstrate > 90% speech intelligibility when reading orientation information.  Baseline:  Goal status: INITIAL Goal status: ONGOING; progress made  3. Pt will answer semi-complex yes/no questions with > 75% accuracy given minimal cues.   Baseline: 75% with simple yes/no questions  Goal status: INITIAL: MET     ASSESSMENT:   CLINICAL IMPRESSION: Pt is a 57 year old right handed male who was seen today for a speech-language treatment d/t moderate transcortical sensory aphasia. Pt's aphasia is c/b moderate expressive deficits, mild receptive deficits, mild to  deficits in reading comprehension, spelling and written language.   Pt continues to demonstrate progress and as a result, he has successfully returned to work full-time. He continues to demonstrate improved prosody during speech intelligibility tasks but has non-fluent expressive aphasia and would benefit from re-certification. See treatment note for more details.   OBJECTIVE IMPAIRMENTS include expressive language, receptive language, apraxia, and reading comprehension and ability to produce written language . These impairments are limiting patient from return to work, managing medications, managing appointments, managing finances, household responsibilities, ADLs/IADLs, and effectively communicating at home and in community. Factors affecting potential to achieve goals and functional outcome are severity of impairments. Patient will benefit from skilled SLP services to address above impairments and improve overall function.   REHAB POTENTIAL: Excellent   PLAN: SLP FREQUENCY: 3x/week to 4xweek   SLP DURATION: 12 weeks   PLANNED INTERVENTIONS: Language facilitation, Cueing hierachy, Functional tasks, Multimodal communication approach, SLP instruction and feedback, Compensatory strategies, and Patient/family education     Koehn Salehi B. Dreama Saa, M.S., CCC-SLP, Tree surgeon Certified Brain  Injury Specialist [facility]  [facility] Rehabilitation Services Office [phone] Ascom [phone] Fax [phone]

## 2023-01-26 ENCOUNTER — Ambulatory Visit: Payer: BC Managed Care – PPO | Admitting: Speech Pathology

## 2023-01-30 ENCOUNTER — Ambulatory Visit: Payer: BC Managed Care – PPO

## 2023-01-30 ENCOUNTER — Ambulatory Visit: Payer: BC Managed Care – PPO | Admitting: Speech Pathology

## 2023-01-31 ENCOUNTER — Ambulatory Visit: Payer: BC Managed Care – PPO | Admitting: Speech Pathology

## 2023-01-31 DIAGNOSIS — I63512 Cerebral infarction due to unspecified occlusion or stenosis of left middle cerebral artery: Secondary | ICD-10-CM

## 2023-01-31 DIAGNOSIS — R4701 Aphasia: Secondary | ICD-10-CM

## 2023-01-31 DIAGNOSIS — R482 Apraxia: Secondary | ICD-10-CM

## 2023-01-31 NOTE — Therapy (Signed)
OUTPATIENT SPEECH LANGUAGE PATHOLOGY  TREATMENT NOTE 10th VISIT PROGRESS NOTE RE-CERTIFICATION REQUEST    Patient Name: [name] MRN: [id number] DOB:1965-10-21, 57 y.o., male Today's Date: 01/31/2023   PCP: Myrene Buddy, NP REFERRING PROVIDER: Mariam Dollar, PA  Speech Therapy Progress Note  Dates of Reporting Period: 01/09/2023 to 01/25/2023  Objective: Patient has been seen for 10 speech therapy sessions this reporting period targeting mild to moderate aphasia. Patient is making progress toward LTGs and met 2 of 3 STGs this reporting period. See skilled intervention, clinical impressions, and goals below for details.    End of Session - 01/31/23 1557     Visit Number 91    Number of Visits 114    Date for SLP Re-Evaluation 04/19/23    Authorization Type [employer]    Progress Note Due on Visit 100    SLP Start Time 1530    SLP Stop Time  1615    SLP Time Calculation (min) 45 min    Activity Tolerance Patient tolerated treatment well              Past Medical History:  Diagnosis Date   Hypertension    LVH (left ventricular hypertrophy)    Myocardial infarction (HCC)    Vitamin D deficiency    Past Surgical History:  Procedure Laterality Date   ANTERIOR CRUCIATE LIGAMENT REPAIR     COLONOSCOPY WITH PROPOFOL N/A 04/11/2018   Procedure: COLONOSCOPY WITH PROPOFOL;  Surgeon: Toledo, Boykin Nearing, MD;  Location: [facility] ENDOSCOPY;  Service: Gastroenterology;  Laterality: N/A;   GASTRIC RESECTION     IR ANGIO INTRA EXTRACRAN SEL INTERNAL CAROTID UNI L MOD SED  08/15/2022   IR US GUIDE VASC ACCESS RIGHT  08/15/2022   LAPAROTOMY     LOOP RECORDER INSERTION N/A 08/18/2022   Procedure: LOOP RECORDER INSERTION;  Surgeon: Regan Lemming, MD;  Location: [facility] INVASIVE CV LAB;  Service: Cardiovascular;  Laterality: N/A;   RADIOLOGY WITH ANESTHESIA N/A 08/14/2022   Procedure: IR WITH ANESTHESIA;  Surgeon: Julieanne Cotton, MD;  Location: [facility] OR;   Service: Radiology;  Laterality: N/A;   TEE WITHOUT CARDIOVERSION N/A 08/18/2022   Procedure: TRANSESOPHAGEAL ECHOCARDIOGRAM;  Surgeon: [name], MD;  Location: [facility] INVASIVE CV LAB;  Service: Cardiovascular;  Laterality: N/A;   Patient Active Problem List   Diagnosis Date Noted   Essential hypertension, benign 08/18/2022   Hyperlipidemia 08/18/2022   DM (diabetes mellitus), type 2 (HCC) 08/18/2022   Obesity (BMI 30-39.9) 08/18/2022   CAD (coronary artery disease) 08/18/2022   OSA (obstructive sleep apnea) 08/18/2022   Acute ischemic left MCA stroke (HCC) 08/18/2022   Acute ischemic right MCA stroke (HCC) 08/14/2022   Hypertensive urgency 04/27/2017   ONSET DATE: 08/14/2022; date of referral 08/23/2022    REFERRING DIAG: N82.956 (ICD-10-CM) - Cerebral infarction due to unspecified occlusion or stenosis of left middle cerebral artery   THERAPY DIAG:  Aphasia   Apraxia   Cerebral infarction due to unspecified occlusion or stenosis of left middle cerebral artery (HCC)   Rationale for Evaluation and Treatment Rehabilitation   SUBJECTIVE:      PERTINENT HISTORY:  [name] is 57 year old male who presented to the [facility] ED on 08/14/2022 noted by wife to be very weak and confused and halfway enter out of the bathtub at home. Symptoms were sudden in onset. Last known well 9 PM. Patient presented with global aphasia. Code stroke activated. CT head negative for hemorrhage, aspects of 9,  mild hyperdensity left MCA could indicate thrombus. Tenecteplase administered. CTA showed left M1 occlusion and neurointerventional radiology contacted. Patient transferred to [facility] for thrombectomy. On route he was noted to have periorbital welts and uvular swelling. Was concern of possible angioedema secondary to tenecteplase. He was intubated for airway protection and underwent cerebral angiogram by Dr. Tommie Sams. Recanalization of the left MCA vascular tree noted. Critical care  medicine consulted for management and placed on low-dose Neo-Synephrine infusion. Extubated on 4/23. 2D echo with EF of approximately 60 to 65% with trivial MVR. LDL 96, hemoglobin A1c 6.3%. He was started on heparin subcutaneously for VTE prophylaxis.     DIAGNOSTIC FINDINGS:    CT Head - 08/14/2022 Mild hyperdensity of the left MCA   CT Head and Neck Angio - 08/14/2022 Proximal occlusion of the left MCA M1 segment with poor collateralization throughout the MCA territory.   IR Angio - 08/15/2022 Interval recanalization of the left M1/MCA occlusion seen on prior CT angiogram likely as a result of TNK administration. Therefore, no intervention performed.   MRI 08/15/2022 Acute cortical infarcts along the left frontal operculum and left anterior insula with additional small area of acute cortical infarct within left parietal lobe. Other areas of faint cortical DWI hyperintensity throughout the left MCA territory, including the left putamen, may reflect additional areas of infarct or ischemia. 2. Loss of the left transverse sinus flow void is favored to reflect slow flow. If clinical suspicion for thrombosis, CT venography could be performed for further characterization.     PAIN:  Are you having pain? No   FALLS: Has patient fallen in last 6 months?  No   LIVING ENVIRONMENT: Lives with: lives with their family Lives in: House/apartment   PLOF:  Level of assistance: Independent with ADLs, Independent with IADLs Employment: Full-time employment     PATIENT GOALS    to be able to communicate again   SUBJECTIVE STATEMENT: Pt arrived after work - pt returned to work on 01/24/2023 Pt accompanied by: self   OBJECTIVE:   TODAY'S TREATMENT:  Skilled treatment session focused on pt's communication goals. 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 Yes   SHORT TERM GOALS: Target date: 10 sessions   SEE PREVIOUS NOTES FOR PROGRESS TOWARDS GOALS  Updated 01/25/2023 6.  With minimal cues, pt will demonstrate improved reading comprehension by following 2 step written directions with 75% accuracy.  Baseline:  Goal status: INITIAL: MET  7.  With minimal cues, pt will type phrase level basic functional statements (3-4 words in length) in 5 out of 7 opportunities.  Baseline:  Goal status: INITIAL:GOOD PROGRESS MADE 3 OUT OF 7 OPPORTUNITIES typing 2-3 word length  8.  With minimal cues, pt will imitate basic functional phrase level statements with 75% speech intelligibility.  Baseline:  Goal status: INITIAL: GREAT PROGRESS MADE: MET  LONG TERM GOALS: Target date: 01/26/2023   SEE PREVIOUS NOTES FOR PROGRESS TOWARDS GOALS  Updated 01/25/2023  Pt will use multimodal means of communication to express basic biographical information about himself, activities within his day and his family with minimal assistance: ongoing    2.  Pt will demonstrate > 90% speech intelligibility when reading orientation information.  Baseline:  Goal status: INITIAL Goal status: ONGOING; progress made  3. Pt will answer semi-complex  yes/no questions with > 75% accuracy given minimal cues.   Baseline: 75% with simple yes/no questions  Goal status: INITIAL: MET     ASSESSMENT:   CLINICAL IMPRESSION: Pt is a 57 year old right handed male who was seen today for a speech-language treatment d/t moderate transcortical sensory aphasia. Pt's aphasia is c/b moderate expressive deficits, mild receptive deficits, mild to  deficits in reading comprehension, spelling and written language.   Pt continues to demonstrate progress and as a result, he has successfully returned to work full-time. He continues to demonstrate improved prosody during speech intelligibility tasks but has non-fluent expressive aphasia and would benefit from re-certification. See treatment note for more details.   OBJECTIVE IMPAIRMENTS include expressive language, receptive language, apraxia, and reading comprehension and ability to produce written language . These impairments are limiting patient from return to work, managing medications, managing appointments, managing finances, household responsibilities, ADLs/IADLs, and effectively communicating at home and in community. Factors affecting potential to achieve goals and functional outcome are severity of impairments. Patient will benefit from skilled SLP services to address above impairments and improve overall function.   REHAB POTENTIAL: Excellent   PLAN: SLP FREQUENCY: 3x/week to 4xweek   SLP DURATION: 12 weeks   PLANNED INTERVENTIONS: Language facilitation, Cueing hierachy, Functional tasks, Multimodal communication approach, SLP instruction and feedback, Compensatory strategies, and Patient/family education     Delbert Darley B. Dreama Saa, M.S., CCC-SLP, Tree surgeon Certified Brain Injury Specialist [facility]  [facility] Office [phone] Ascom [phone] Fax [phone]

## 2023-02-01 ENCOUNTER — Ambulatory Visit: Payer: BC Managed Care – PPO | Admitting: Speech Pathology

## 2023-02-02 ENCOUNTER — Ambulatory Visit: Payer: BC Managed Care – PPO | Admitting: Speech Pathology

## 2023-02-02 DIAGNOSIS — R482 Apraxia: Secondary | ICD-10-CM

## 2023-02-02 DIAGNOSIS — R4701 Aphasia: Secondary | ICD-10-CM

## 2023-02-02 NOTE — Progress Notes (Signed)
[facility]   MULTIDISCIPLINARY HEART VALVE CLINIC                                     Cardiology Office Note:    Date:  02/03/2023   ID:  [name], DOB 1965-04-28, MRN [id number]  PCP:  Myrene Buddy, NP  [facility] Cardiologist:  Tonny Bollman, MD  [facility] Electrophysiologist:  None   Referring MD: Myrene Buddy, *   1 month s/p PFO closure   History of Present Illness:    [name] is a 57 y.o. male with a hx of CVA with residual aphasia (07/2022), HTN, CKG stage IV, prediabetes and PFO s/p PFO closure (12/30/22) who presents to clinic for follow up.  Mr. Erhardt developed sudden onset of confusion with right-sided weakness, and aphasia. He was treated with TNK at an outside facility and transferred to [facility] for emergent thrombectomy, but intervention was not required due to recanalization following TNK. MRI showed findings consistent with acute stroke with cortical infarcts along the left frontal lobe and left parietal lobe. TEE at that time showed a PFO with right to left shunt confirmed by bubble study. Transcranial Doppler study confirmed a large PFO with a curtain effect during Valsalva. He underwent ILR placement which has shown no evidence of AF since implant. Since he had the stroke on aspirin neurology recommend aspirin and Plavix for 3 weeks then Plavix alone.   S/p successful transcatheter PFO closure using a 25 mm Amplatzer Talisman PFO occluder on 12/30/22 by Dr. Excell Seltzer. Post op echo showed EF 60% and no interatrial shunting. Discharged on aspirin and plavix x 6 months.   Today the patient presents to clinic for follow up. Here with his significant other. No CP or SOB. No LE edema, orthopnea or PND. No dizziness or syncope. No blood in stool or urine. No palpitations.    Past Medical History:  Diagnosis Date   Hypertension    LVH (left ventricular hypertrophy)    Myocardial infarction (HCC)    Stroke (cerebrum)  (HCC)    Vitamin D deficiency     Past Surgical History:  Procedure Laterality Date   ANTERIOR CRUCIATE LIGAMENT REPAIR     COLONOSCOPY WITH PROPOFOL N/A 04/11/2018   Procedure: COLONOSCOPY WITH PROPOFOL;  Surgeon: Toledo, Boykin Nearing, MD;  Location: [facility] ENDOSCOPY;  Service: Gastroenterology;  Laterality: N/A;   GASTRIC RESECTION     IR ANGIO INTRA EXTRACRAN SEL INTERNAL CAROTID UNI L MOD SED  08/15/2022   IR US GUIDE VASC ACCESS RIGHT  08/15/2022   LAPAROTOMY     LOOP RECORDER INSERTION N/A 08/18/2022   Procedure: LOOP RECORDER INSERTION;  Surgeon: Regan Lemming, MD;  Location: [facility] INVASIVE CV LAB;  Service: Cardiovascular;  Laterality: N/A;   PATENT FORAMEN OVALE(PFO) CLOSURE N/A 12/30/2022   Procedure: PATENT FORAMEN OVALE(PFO) CLOSURE;  Surgeon: Tonny Bollman, MD;  Location: [facility] INVASIVE CV LAB;  Service: Cardiovascular;  Laterality: N/A;   RADIOLOGY WITH ANESTHESIA N/A 08/14/2022   Procedure: IR WITH ANESTHESIA;  Surgeon: Julieanne Cotton, MD;  Location: [facility] OR;  Service: Radiology;  Laterality: N/A;   TEE WITHOUT CARDIOVERSION N/A 08/18/2022   Procedure: TRANSESOPHAGEAL ECHOCARDIOGRAM;  Surgeon: [name], MD;  Location: [facility] INVASIVE CV LAB;  Service: Cardiovascular;  Laterality: N/A;    Current Medications: Current Meds  Medication Sig   amLODipine (NORVASC) 10 MG  tablet Take 10 mg by mouth daily.   aspirin EC 81 MG tablet Take 1 tablet (81 mg total) by mouth daily. Swallow whole.   atorvastatin (LIPITOR) 20 MG tablet Take 1 tablet (20 mg total) by mouth daily.   carvedilol (COREG) 12.5 MG tablet Take 1 tablet (12.5 mg total) by mouth 2 (two) times daily.   cholecalciferol (VITAMIN D3) 25 MCG (1000 UNIT) tablet Take 1,000 Units by mouth daily.   clopidogrel (PLAVIX) 75 MG tablet Take 1 tablet (75 mg total) by mouth daily.      ROS:   Please see the history of present illness.    All other systems reviewed and are negative.  EKGs   EKG:  EKG is NOT ordered today.    Recent Labs: 08/19/2022: ALT 18 12/21/2022: BUN 27; Creatinine, Ser 2.66; Hemoglobin 13.6; Platelets 236; Potassium 4.5; Sodium 141  Recent Lipid Panel    Component Value Date/Time   CHOL 172 08/15/2022 0424   TRIG 125 08/16/2022 0647   HDL 36 (L) 08/15/2022 0424   CHOLHDL 4.8 08/15/2022 0424   VLDL 40 08/15/2022 0424   LDLCALC 96 08/15/2022 0424     Risk Assessment/Calculations:            Physical Exam:    VS:  BP (!) 140/82 (BP Location: Left Arm, Patient Position: Sitting, Cuff Size: Normal)   Pulse 84   Ht 6' (1.829 m)   Wt 205 lb 9.6 oz (93.3 kg)   SpO2 97%   BMI 27.88 kg/m     Wt Readings from Last 3 Encounters:  02/03/23 205 lb 9.6 oz (93.3 kg)  12/21/22 199 lb 6.4 oz (90.4 kg)  11/03/22 200 lb 12.8 oz (91.1 kg)     GEN: Well nourished, well developed in no acute distress NECK: No JVD; No carotid bruits CARDIAC: RRR, no murmurs, rubs, gallops RESPIRATORY:  Clear to auscultation without rales, wheezing or rhonchi  ABDOMEN: Soft, non-tender, non-distended EXTREMITIES:  No edema; No deformity   ASSESSMENT:    1. S/P percutaneous patent foramen ovale closure   2. Cerebrovascular accident (CVA), unspecified mechanism (HCC)   3. Essential hypertension   4. CKD (chronic kidney disease) stage 4, GFR 15-29 ml/min (HCC)    PLAN:    In order of problems listed above:  PFO s/p PFO closure: doing excellent. Continue on DAPT with aspirin 81mg  daily and plavix 75mg  daily. Will drop aspirin 81 mg daily after 6 months (06/29/23) and continue long term Plavix as recommended by neurology. We discussed the need for 6 months of SBE prophylaxis and he will defer dental work. I will see him back in 1 year with a limited echo with bubble.    CVA: since he had the stroke on aspirin neurology recommend aspirin and Plavix for 3 weeks then Plavix alone. Continue Lipitor 20mg  daily. Still has some residual aphagia   HTN: BP borderline today 140/82. Continue Coreg 12.5mg  BID and  Norvasc 10mg  daily. He reports a long history of white coat HTN and says it runs 120/60s at home. They have been aggressive with BP given progressive kidney dysfunction.    CKD stage IV: baseline Cr appears to be in the 2-4 range on lab review. He follows with Dr. Dayton Scrape at [facility] and he is carefully monitoring his BP.    Medication Adjustments/Labs and Tests Ordered: Current medicines are reviewed at length with the patient today.  Concerns regarding medicines are outlined above.  Orders Placed This Encounter  Procedures  ECHOCARDIOGRAM LIMITED BUBBLE STUDY   No orders of the defined types were placed in this encounter.   Patient Instructions  Medication Instructions:  Stop Aspirin on Thursday, June 29 2023  *If you need a refill on your cardiac medications before your next appointment, please call your pharmacy*   Lab Work: None ordered   If you have labs (blood work) drawn today and your tests are completely normal, you will receive your results only by: MyChart Message (if you have MyChart) OR A paper copy in the mail If you have any lab test that is abnormal or we need to change your treatment, we will call you to review the results.   Testing/Procedures: Echocardiogram as scheduled    Follow-Up: Follow up as scheduled   Other Instructions     Signed, Cline Crock, PA-C  02/03/2023 9:44 AM    [facility]

## 2023-02-03 ENCOUNTER — Ambulatory Visit: Payer: BC Managed Care – PPO | Attending: Cardiology | Admitting: Physician Assistant

## 2023-02-03 VITALS — BP 140/82 | HR 84 | Ht 72.0 in | Wt 205.6 lb

## 2023-02-03 DIAGNOSIS — Z8774 Personal history of (corrected) congenital malformations of heart and circulatory system: Secondary | ICD-10-CM | POA: Diagnosis not present

## 2023-02-03 DIAGNOSIS — I639 Cerebral infarction, unspecified: Secondary | ICD-10-CM

## 2023-02-03 DIAGNOSIS — I10 Essential (primary) hypertension: Secondary | ICD-10-CM

## 2023-02-03 DIAGNOSIS — N184 Chronic kidney disease, stage 4 (severe): Secondary | ICD-10-CM | POA: Diagnosis not present

## 2023-02-03 NOTE — Patient Instructions (Signed)
Medication Instructions:  Stop Aspirin on Thursday, June 29 2023  *If you need a refill on your cardiac medications before your next appointment, please call your pharmacy*   Lab Work: None ordered   If you have labs (blood work) drawn today and your tests are completely normal, you will receive your results only by: MyChart Message (if you have MyChart) OR A paper copy in the mail If you have any lab test that is abnormal or we need to change your treatment, we will call you to review the results.   Testing/Procedures: Echocardiogram as scheduled    Follow-Up: Follow up as scheduled   Other Instructions

## 2023-02-03 NOTE — Therapy (Signed)
OUTPATIENT SPEECH LANGUAGE PATHOLOGY  TREATMENT NOTE   Patient Name: [name] MRN: [id number] DOB:Dec 08, 1965, 57 y.o., male Today's Date: 02/03/2023   PCP: Myrene Buddy, NP REFERRING PROVIDER: Mariam Dollar, PA     End of Session - 02/03/23 0844     Visit Number 92    Number of Visits 114    Date for SLP Re-Evaluation 04/19/23    Authorization Type [employer]    Progress Note Due on Visit 100    SLP Start Time 1530    SLP Stop Time  1615    SLP Time Calculation (min) 45 min    Activity Tolerance Patient tolerated treatment well              Past Medical History:  Diagnosis Date   Hypertension    LVH (left ventricular hypertrophy)    Myocardial infarction (HCC)    Vitamin D deficiency    Past Surgical History:  Procedure Laterality Date   ANTERIOR CRUCIATE LIGAMENT REPAIR     COLONOSCOPY WITH PROPOFOL N/A 04/11/2018   Procedure: COLONOSCOPY WITH PROPOFOL;  Surgeon: Toledo, Boykin Nearing, MD;  Location: [facility] ENDOSCOPY;  Service: Gastroenterology;  Laterality: N/A;   GASTRIC RESECTION     IR ANGIO INTRA EXTRACRAN SEL INTERNAL CAROTID UNI L MOD SED  08/15/2022   IR US GUIDE VASC ACCESS RIGHT  08/15/2022   LAPAROTOMY     LOOP RECORDER INSERTION N/A 08/18/2022   Procedure: LOOP RECORDER INSERTION;  Surgeon: Regan Lemming, MD;  Location: [facility] INVASIVE CV LAB;  Service: Cardiovascular;  Laterality: N/A;   RADIOLOGY WITH ANESTHESIA N/A 08/14/2022   Procedure: IR WITH ANESTHESIA;  Surgeon: Julieanne Cotton, MD;  Location: [facility] OR;  Service: Radiology;  Laterality: N/A;   TEE WITHOUT CARDIOVERSION N/A 08/18/2022   Procedure: TRANSESOPHAGEAL ECHOCARDIOGRAM;  Surgeon: [name], MD;  Location: [facility] INVASIVE CV LAB;  Service: Cardiovascular;  Laterality: N/A;   Patient Active Problem List   Diagnosis Date Noted   Essential hypertension, benign 08/18/2022   Hyperlipidemia 08/18/2022   DM (diabetes mellitus), type 2 (HCC) 08/18/2022   Obesity  (BMI 30-39.9) 08/18/2022   CAD (coronary artery disease) 08/18/2022   OSA (obstructive sleep apnea) 08/18/2022   Acute ischemic left MCA stroke (HCC) 08/18/2022   Acute ischemic right MCA stroke (HCC) 08/14/2022   Hypertensive urgency 04/27/2017   ONSET DATE: 08/14/2022; date of referral 08/23/2022    REFERRING DIAG: Q65.784 (ICD-10-CM) - Cerebral infarction due to unspecified occlusion or stenosis of left middle cerebral artery   THERAPY DIAG:  Aphasia   Apraxia   Cerebral infarction due to unspecified occlusion or stenosis of left middle cerebral artery (HCC)   Rationale for Evaluation and Treatment Rehabilitation   SUBJECTIVE:      PERTINENT HISTORY:  [name] is 57 year old male who presented to the [facility] ED on 08/14/2022 noted by wife to be very weak and confused and halfway enter out of the bathtub at home. Symptoms were sudden in onset. Last known well 9 PM. Patient presented with global aphasia. Code stroke activated. CT head negative for hemorrhage, aspects of 9, mild hyperdensity left MCA could indicate thrombus. Tenecteplase administered. CTA showed left M1 occlusion and neurointerventional radiology contacted. Patient transferred to [facility] for thrombectomy. On route he was noted to have periorbital welts and uvular swelling. Was concern of possible angioedema secondary to tenecteplase. He was intubated for airway protection and underwent cerebral angiogram by Dr. Tommie Sams. Recanalization of the  left MCA vascular tree noted. Critical care medicine consulted for management and placed on low-dose Neo-Synephrine infusion. Extubated on 4/23. 2D echo with EF of approximately 60 to 65% with trivial MVR. LDL 96, hemoglobin A1c 6.3%. He was started on heparin subcutaneously for VTE prophylaxis.     DIAGNOSTIC FINDINGS:    CT Head - 08/14/2022 Mild hyperdensity of the left MCA   CT Head and Neck Angio - 08/14/2022 Proximal occlusion of the left MCA M1 segment  with poor collateralization throughout the MCA territory.   IR Angio - 08/15/2022 Interval recanalization of the left M1/MCA occlusion seen on prior CT angiogram likely as a result of TNK administration. Therefore, no intervention performed.   MRI 08/15/2022 Acute cortical infarcts along the left frontal operculum and left anterior insula with additional small area of acute cortical infarct within left parietal lobe. Other areas of faint cortical DWI hyperintensity throughout the left MCA territory, including the left putamen, may reflect additional areas of infarct or ischemia. 2. Loss of the left transverse sinus flow void is favored to reflect slow flow. If clinical suspicion for thrombosis, CT venography could be performed for further characterization.     PAIN:  Are you having pain? No   FALLS: Has patient fallen in last 6 months?  No   LIVING ENVIRONMENT: Lives with: lives with their family Lives in: House/apartment   PLOF:  Level of assistance: Independent with ADLs, Independent with IADLs Employment: Full-time employment     PATIENT GOALS    to be able to communicate again   SUBJECTIVE STATEMENT: Pt continues to report that work is going well Pt accompanied by: self   OBJECTIVE:   TODAY'S TREATMENT:  Skilled treatment session focused on pt's communication goals. SLP facilitated session by providing the following interventions:  TalkPath Therapy App utilized for motor pattern/speech intelligibility Functional Repetition: -  Phrase level 4 - 17 out 20 Phrase level 5 - 10 out of 19 Pt continues to be able to establish motor pattern with more ease (less groping)  Word finding thru naming with printed word provided along with image Flash card Naming Nouns pt with good improved as able to name items with Min a use of sentence completion - no longer dependent on having initial phoneme Flash card Naming Verbs - Moderate A with use of sentence  completion   Conversational speech, pt able to communicate basic information regarding return to work, 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 Yes   SHORT TERM GOALS: Target date: 10 sessions   SEE PREVIOUS NOTES FOR PROGRESS TOWARDS GOALS  Updated 01/25/2023 6.  With minimal cues, pt will demonstrate improved reading comprehension by following 2 step written directions with 75% accuracy.  Baseline:  Goal status: INITIAL: MET  7.  With minimal cues, pt will type phrase level basic functional statements (3-4 words in length) in 5 out of 7 opportunities.  Baseline:  Goal status: INITIAL:GOOD PROGRESS MADE 3 OUT OF 7 OPPORTUNITIES typing 2-3 word length  8.  With minimal cues, pt will imitate basic functional phrase level statements with 75% speech intelligibility.  Baseline:  Goal status: INITIAL: GREAT PROGRESS MADE: MET  LONG TERM GOALS: Target date: 01/26/2023   SEE PREVIOUS NOTES FOR PROGRESS TOWARDS GOALS  Updated 01/25/2023  Pt will use multimodal means of communication to express basic biographical information about himself, activities within his day and his family with minimal assistance: ongoing    2.  Pt will demonstrate > 90% speech intelligibility when reading orientation information.  Baseline:  Goal status: INITIAL Goal status: ONGOING; progress made  3. Pt will answer semi-complex yes/no questions with > 75% accuracy given minimal cues.   Baseline: 75%  with simple yes/no questions  Goal status: INITIAL: MET     ASSESSMENT:   CLINICAL IMPRESSION: Pt is a 56 year old right handed male who was seen today for a speech-language treatment d/t moderate transcortical sensory aphasia. Pt's aphasia is c/b moderate expressive deficits, mild receptive deficits, mild to  deficits in reading comprehension, spelling and written language.   Pt continues with improved spontaneous speech intelligibility - 2 complete spontaneous sentences during today's session. See treatment note for more details.   OBJECTIVE IMPAIRMENTS include expressive language, receptive language, apraxia, and reading comprehension and ability to produce written language . These impairments are limiting patient from return to work, managing medications, managing appointments, managing finances, household responsibilities, ADLs/IADLs, and effectively communicating at home and in community. Factors affecting potential to achieve goals and functional outcome are severity of impairments. Patient will benefit from skilled SLP services to address above impairments and improve overall function.   REHAB POTENTIAL: Excellent   PLAN: SLP FREQUENCY: 3x/week to 4xweek   SLP DURATION: 12 weeks   PLANNED INTERVENTIONS: Language facilitation, Cueing hierachy, Functional tasks, Multimodal communication approach, SLP instruction and feedback, Compensatory strategies, and Patient/family education     Shavelle Runkel B. Dreama Saa, M.S., CCC-SLP, Tree surgeon Certified Brain Injury Specialist [facility]  [facility] Rehabilitation Services Office [phone] Ascom [phone] Fax [phone]

## 2023-02-06 ENCOUNTER — Ambulatory Visit: Payer: BC Managed Care – PPO | Admitting: Speech Pathology

## 2023-02-06 DIAGNOSIS — R4701 Aphasia: Secondary | ICD-10-CM | POA: Diagnosis not present

## 2023-02-06 DIAGNOSIS — R482 Apraxia: Secondary | ICD-10-CM

## 2023-02-06 DIAGNOSIS — I63512 Cerebral infarction due to unspecified occlusion or stenosis of left middle cerebral artery: Secondary | ICD-10-CM

## 2023-02-06 NOTE — Therapy (Signed)
OUTPATIENT SPEECH LANGUAGE PATHOLOGY  TREATMENT NOTE   Patient Name: [name] MRN: [id number] DOB:May 31, 1965, 57 y.o., male Today's Date: 02/06/2023   PCP: Myrene Buddy, NP REFERRING PROVIDER: Mariam Dollar, PA     End of Session - 02/06/23 1535     Visit Number 93    Number of Visits 114    Date for SLP Re-Evaluation 04/19/23    Authorization Type [employer]    Progress Note Due on Visit 100    SLP Start Time 1530    SLP Stop Time  1615    SLP Time Calculation (min) 45 min    Activity Tolerance Patient tolerated treatment well              Past Medical History:  Diagnosis Date   Hypertension    LVH (left ventricular hypertrophy)    Myocardial infarction (HCC)    Vitamin D deficiency    Past Surgical History:  Procedure Laterality Date   ANTERIOR CRUCIATE LIGAMENT REPAIR     COLONOSCOPY WITH PROPOFOL N/A 04/11/2018   Procedure: COLONOSCOPY WITH PROPOFOL;  Surgeon: Toledo, Boykin Nearing, MD;  Location: [facility] ENDOSCOPY;  Service: Gastroenterology;  Laterality: N/A;   GASTRIC RESECTION     IR ANGIO INTRA EXTRACRAN SEL INTERNAL CAROTID UNI L MOD SED  08/15/2022   IR US GUIDE VASC ACCESS RIGHT  08/15/2022   LAPAROTOMY     LOOP RECORDER INSERTION N/A 08/18/2022   Procedure: LOOP RECORDER INSERTION;  Surgeon: Regan Lemming, MD;  Location: [facility] INVASIVE CV LAB;  Service: Cardiovascular;  Laterality: N/A;   RADIOLOGY WITH ANESTHESIA N/A 08/14/2022   Procedure: IR WITH ANESTHESIA;  Surgeon: Julieanne Cotton, MD;  Location: [facility] OR;  Service: Radiology;  Laterality: N/A;   TEE WITHOUT CARDIOVERSION N/A 08/18/2022   Procedure: TRANSESOPHAGEAL ECHOCARDIOGRAM;  Surgeon: [name], MD;  Location: [facility] INVASIVE CV LAB;  Service: Cardiovascular;  Laterality: N/A;   Patient Active Problem List   Diagnosis Date Noted   Essential hypertension, benign 08/18/2022   Hyperlipidemia 08/18/2022   DM (diabetes mellitus), type 2 (HCC) 08/18/2022   Obesity  (BMI 30-39.9) 08/18/2022   CAD (coronary artery disease) 08/18/2022   OSA (obstructive sleep apnea) 08/18/2022   Acute ischemic left MCA stroke (HCC) 08/18/2022   Acute ischemic right MCA stroke (HCC) 08/14/2022   Hypertensive urgency 04/27/2017   ONSET DATE: 08/14/2022; date of referral 08/23/2022    REFERRING DIAG: W10.272 (ICD-10-CM) - Cerebral infarction due to unspecified occlusion or stenosis of left middle cerebral artery   THERAPY DIAG:  Aphasia   Apraxia   Cerebral infarction due to unspecified occlusion or stenosis of left middle cerebral artery (HCC)   Rationale for Evaluation and Treatment Rehabilitation   SUBJECTIVE:      PERTINENT HISTORY:  Yuda Cupo is 57 year old male who presented to the [facility] ED on 08/14/2022 noted by wife to be very weak and confused and halfway enter out of the bathtub at home. Symptoms were sudden in onset. Last known well 9 PM. Patient presented with global aphasia. Code stroke activated. CT head negative for hemorrhage, aspects of 9, mild hyperdensity left MCA could indicate thrombus. Tenecteplase administered. CTA showed left M1 occlusion and neurointerventional radiology contacted. Patient transferred to [facility] for thrombectomy. On route he was noted to have periorbital welts and uvular swelling. Was concern of possible angioedema secondary to tenecteplase. He was intubated for airway protection and underwent cerebral angiogram by Dr. Tommie Sams. Recanalization of the  left MCA vascular tree noted. Critical care medicine consulted for management and placed on low-dose Neo-Synephrine infusion. Extubated on 4/23. 2D echo with EF of approximately 60 to 65% with trivial MVR. LDL 96, hemoglobin A1c 6.3%. He was started on heparin subcutaneously for VTE prophylaxis.     DIAGNOSTIC FINDINGS:    CT Head - 08/14/2022 Mild hyperdensity of the left MCA   CT Head and Neck Angio - 08/14/2022 Proximal occlusion of the left MCA M1 segment  with poor collateralization throughout the MCA territory.   IR Angio - 08/15/2022 Interval recanalization of the left M1/MCA occlusion seen on prior CT angiogram likely as a result of TNK administration. Therefore, no intervention performed.   MRI 08/15/2022 Acute cortical infarcts along the left frontal operculum and left anterior insula with additional small area of acute cortical infarct within left parietal lobe. Other areas of faint cortical DWI hyperintensity throughout the left MCA territory, including the left putamen, may reflect additional areas of infarct or ischemia. 2. Loss of the left transverse sinus flow void is favored to reflect slow flow. If clinical suspicion for thrombosis, CT venography could be performed for further characterization.     PAIN:  Are you having pain? No   FALLS: Has patient fallen in last 6 months?  No   LIVING ENVIRONMENT: Lives with: lives with their family Lives in: House/apartment   PLOF:  Level of assistance: Independent with ADLs, Independent with IADLs Employment: Full-time employment     PATIENT GOALS    to be able to communicate again   SUBJECTIVE STATEMENT: "Oh yes, going good" Pt accompanied by: self   OBJECTIVE:   TODAY'S TREATMENT:  Skilled treatment session focused on pt's communication goals. SLP facilitated session by providing the following interventions:  TalkPath Therapy App utilized for motor pattern/speech intelligibility Functional Repetition: -  Phrase level 5 - 17 out of 19 Pt continues to be able to establish motor pattern with more ease (less groping)  Word finding thru naming with printed word provided along with image Flash card Naming Nouns - pt with increased struggle today, requiring sentence completion, printed word to name 8 out of 20 improving to 20 out of 20 with the above along with phonemic cues  In contrast to naming nouns, pt with much improved conversational speech today. He spoke in full  sentence length x 3, when using Text to Speech app, his speech during imitative phrases during conversation about work, supper, lunch and football games from this past week -    PATIENT EDUCATION: Education details: see above Person educated: Patient and his wife via phone -  Education method: Explanation, pt took Dealer comprehension: verbalized understanding and needs further education   HOME EXERCISE PROGRAM:  Complete TalkPath Therapy  Read/repeat functional phrases  Add sentences/phrases with s-blends - pt continues to indicate using Text to Speech App that he is working with a co-worker for daily practice   GOALS:   Goals reviewed with patient? Yes   SHORT TERM GOALS: Target date: 10 sessions   SEE PREVIOUS NOTES FOR PROGRESS TOWARDS GOALS  Updated 01/25/2023 6.  With minimal cues, pt will demonstrate improved reading comprehension by following 2 step written directions with 75% accuracy.  Baseline:  Goal status: INITIAL: MET  7.  With minimal cues, pt will type phrase level basic functional statements (3-4 words in length) in 5 out of 7 opportunities.  Baseline:  Goal status: INITIAL:GOOD PROGRESS MADE 3 OUT OF 7 OPPORTUNITIES typing 2-3 word length  8.  With minimal cues, pt will imitate basic functional phrase level statements with 75% speech intelligibility.  Baseline:  Goal status: INITIAL: GREAT PROGRESS MADE: MET  LONG TERM GOALS: Target date: 01/26/2023   SEE PREVIOUS NOTES FOR PROGRESS TOWARDS GOALS  Updated 01/25/2023  Pt will use multimodal means of communication to express basic biographical information about himself, activities within his day and his family with minimal assistance: ongoing    2.  Pt will demonstrate > 90% speech intelligibility when reading orientation information.  Baseline:  Goal status: INITIAL Goal status: ONGOING; progress made  3. Pt will answer semi-complex yes/no questions with > 75% accuracy given minimal cues.    Baseline: 75% with simple yes/no questions  Goal status: INITIAL: MET     ASSESSMENT:   CLINICAL IMPRESSION: Pt is a 57 year old right handed male who was seen today for a speech-language treatment d/t moderate transcortical sensory aphasia. Pt's aphasia is c/b moderate expressive deficits, mild receptive deficits, mild to  deficits in reading comprehension, spelling and written language.   Pt continues with improved spontaneous speech intelligibility - 3 complete spontaneous sentences during today's session. He continues to report "it is better at work" when referring to his speech "than that" pointing to iPad and indicating today's activity of naming flashcards. See treatment note for more details.   OBJECTIVE IMPAIRMENTS include expressive language, receptive language, apraxia, and reading comprehension and ability to produce written language . These impairments are limiting patient from return to work, managing medications, managing appointments, managing finances, household responsibilities, ADLs/IADLs, and effectively communicating at home and in community. Factors affecting potential to achieve goals and functional outcome are severity of impairments. Patient will benefit from skilled SLP services to address above impairments and improve overall function.   REHAB POTENTIAL: Excellent   PLAN: SLP FREQUENCY: 3x/week to 4xweek   SLP DURATION: 12 weeks   PLANNED INTERVENTIONS: Language facilitation, Cueing hierachy, Functional tasks, Multimodal communication approach, SLP instruction and feedback, Compensatory strategies, and Patient/family education     Jaimie Redditt B. Dreama Saa, M.S., CCC-SLP, Tree surgeon Certified Brain Injury Specialist [facility]  [facility] Rehabilitation Services Office [phone] Ascom [phone] Fax [phone]

## 2023-02-07 ENCOUNTER — Ambulatory Visit: Payer: BC Managed Care – PPO | Admitting: Speech Pathology

## 2023-02-07 DIAGNOSIS — R4701 Aphasia: Secondary | ICD-10-CM | POA: Diagnosis not present

## 2023-02-07 DIAGNOSIS — R482 Apraxia: Secondary | ICD-10-CM

## 2023-02-07 DIAGNOSIS — I63512 Cerebral infarction due to unspecified occlusion or stenosis of left middle cerebral artery: Secondary | ICD-10-CM

## 2023-02-07 NOTE — Therapy (Signed)
OUTPATIENT SPEECH LANGUAGE PATHOLOGY  TREATMENT NOTE   Patient Name: [name] MRN: [id number] DOB:1965/11/23, 57 y.o., male Today's Date: 02/07/2023   PCP: Myrene Buddy, NP REFERRING PROVIDER: Mariam Dollar, PA     End of Session - 02/07/23 1619     Visit Number 94    Number of Visits 114    Date for SLP Re-Evaluation 04/19/23    Authorization Type [employer]    Progress Note Due on Visit 100    SLP Start Time 1530    SLP Stop Time  1610    SLP Time Calculation (min) 40 min    Activity Tolerance Patient tolerated treatment well              Past Medical History:  Diagnosis Date   Hypertension    LVH (left ventricular hypertrophy)    Myocardial infarction (HCC)    Vitamin D deficiency    Past Surgical History:  Procedure Laterality Date   ANTERIOR CRUCIATE LIGAMENT REPAIR     COLONOSCOPY WITH PROPOFOL N/A 04/11/2018   Procedure: COLONOSCOPY WITH PROPOFOL;  Surgeon: Toledo, Boykin Nearing, MD;  Location: [facility] ENDOSCOPY;  Service: Gastroenterology;  Laterality: N/A;   GASTRIC RESECTION     IR ANGIO INTRA EXTRACRAN SEL INTERNAL CAROTID UNI L MOD SED  08/15/2022   IR US GUIDE VASC ACCESS RIGHT  08/15/2022   LAPAROTOMY     LOOP RECORDER INSERTION N/A 08/18/2022   Procedure: LOOP RECORDER INSERTION;  Surgeon: Regan Lemming, MD;  [facility]: [facility] INVAS[facility] CV LAB;  Service: Cardiovascular;  Laterality: N/A;   RADIOLOGY WITH ANESTHESIA N/A 08/14/2022   Procedure: IR WITH ANESTHESIA;  Surgeon: Julieanne Cotton, MD;  Location: [facility] OR;  Service: Radiology;  Laterality: N/A;   TEE WITHOUT CARDIOVERSION N/A 08/18/2022   Procedure: TRANSESOPHAGEAL ECHOCARDIOGRAM;  Surgeon: [name], MD;  Location: [facility] INVASIVE CV LAB;  Service: Cardiovascular;  Laterality: N/A;   Patient Active Problem List   Diagnosis Date Noted   Essential hypertension, benign 08/18/2022   Hyperlipidemia 08/18/2022   DM (diabetes mellitus), type 2 (HCC) 08/18/2022   Obesity  (BMI 30-39.9) 08/18/2022   CAD (coronary artery disease) 08/18/2022   OSA (obstructive sleep apnea) 08/18/2022   Acute ischemic left MCA stroke (HCC) 08/18/2022   Acute ischemic right MCA stroke (HCC) 08/14/2022   Hypertensive urgency 04/27/2017   ONSET DATE: 08/14/2022; date of referral 08/23/2022    REFERRING DIAG: P29.518 (ICD-10-CM) - Cerebral infarction due to unspecified occlusion or stenosis of left middle cerebral artery   THERAPY DIAG:  Aphasia   Apraxia   Cerebral infarction due to unspecified occlusion or stenosis of left middle cerebral artery (HCC)   Rationale for Evaluation and Treatment Rehabilitation   SUBJECTIVE:      PERTINENT HISTORY:  [name] is 57 year old male who presented to the [facility] ED on 08/14/2022 noted by wife to be very weak and confused and halfway enter out of the bathtub at home. Symptoms were sudden in onset. Last known well 9 PM. Patient presented with global aphasia. Code stroke activated. CT head negative for hemorrhage, aspects of 9, mild hyperdensity left MCA could indicate thrombus. Tenecteplase administered. CTA showed left M1 occlusion and neurointerventional radiology contacted. Patient transferred [facility] for [facility]. On route he was noted to have periorbital welts and uvular swelling. Was concern of possible angioedema secondary to tenecteplase. He was intubated for airway protection and underwent cerebral angiogram by Dr. Tommie Sams. Recanalization of the  left MCA vascular tree noted. Critical care medicine consulted for management and placed on low-dose Neo-Synephrine infusion. Extubated on 4/23. 2D echo with EF of approximately 60 to 65% with trivial MVR. LDL 96, hemoglobin A1c 6.3%. He was started on heparin subcutaneously for VTE prophylaxis.     DIAGNOSTIC FINDINGS:    CT Head - 08/14/2022 Mild hyperdensity of the left MCA   CT Head and Neck Angio - 08/14/2022 Proximal occlusion of the left MCA M1 segment  with poor collateralization throughout the MCA territory.   IR Angio - 08/15/2022 Interval recanalization of the left M1/MCA occlusion seen on prior CT angiogram likely as a result of TNK administration. Therefore, no intervention performed.   MRI 08/15/2022 Acute cortical infarcts along the left frontal operculum and left anterior insula with additional small area of acute cortical infarct within left parietal lobe. Other areas of faint cortical DWI hyperintensity throughout the left MCA territory, including the left putamen, may reflect additional areas of infarct or ischemia. 2. Loss of the left transverse sinus flow void is favored to reflect slow flow. If clinical suspicion for thrombosis, CT venography could be performed for further characterization.     PAIN:  Are you having pain? No   FALLS: Has patient fallen in last 6 months?  No   LIVING ENVIRONMENT: Lives with: lives with their family Lives in: House/apartment   PLOF:  Level of assistance: Independent with ADLs, Independent with IADLs Employment: Full-time employment     PATIENT GOALS    to be able to communicate again   SUBJECTIVE STATEMENT: Pt gestured with a "shutter" to indicate that it was getting colder outside Pt accompanied by: self   OBJECTIVE:   TODAY'S TREATMENT:  Skilled treatment session focused on pt's communication goals. SLP facilitated session by providing the following interventions:  SLP provided motor speech practice utilizing list of clinically generated functional phrases - pt able to repeat these phrases following SLP with > 95% speech intelligibility in 44 out of 53 opportunities  In addition, SLP assisted pt in generating basketball related vocabulary list - pt able to repeat each word with > 95% speech intelligibility in 50 out of 50 words - he hopes to return to coaching   PATIENT EDUCATION: Education details: see above Person educated: Patient and his wife via phone -   Education method: Explanation, pt took pictures Education comprehension: verbalized understanding and needs further education   HOME EXERCISE PROGRAM:  Complete TalkPath Therapy  Read/repeat functional phrases  Add sentences/phrases with s-blends - pt continues to indicate using Text to Speech App that he is working with a co-worker for daily practice   GOALS:   Goals reviewed with patient? Yes   SHORT TERM GOALS: Target date: 10 sessions   SEE PREVIOUS NOTES FOR PROGRESS TOWARDS GOALS  Updated 01/25/2023 6.  With minimal cues, pt will demonstrate improved reading comprehension by following 2 step written directions with 75% accuracy.  Baseline:  Goal status: INITIAL: MET  7.  With minimal cues, pt will type phrase level basic functional statements (3-4 words in length) in 5 out of 7 opportunities.  Baseline:  Goal status: INITIAL:GOOD PROGRESS MADE 3 OUT OF 7 OPPORTUNITIES typing 2-3 word length  8.  With minimal cues, pt will imitate basic functional phrase level statements with 75% speech intelligibility.  Baseline:  Goal status: INITIAL: GREAT PROGRESS MADE: MET  LONG TERM GOALS: Target date: 01/26/2023   SEE PREVIOUS NOTES FOR PROGRESS TOWARDS GOALS  Updated 01/25/2023  Pt  will use multimodal means of communication to express basic biographical information about himself, activities within his day and his family with minimal assistance: ongoing    2.  Pt will demonstrate > 90% speech intelligibility when reading orientation information.  Baseline:  Goal status: INITIAL Goal status: ONGOING; progress made  3. Pt will answer semi-complex yes/no questions with > 75% accuracy given minimal cues.   Baseline: 75% with simple yes/no questions  Goal status: INITIAL: MET     ASSESSMENT:   CLINICAL IMPRESSION: Pt is a 57 year old right handed male who was seen today for a speech-language treatment d/t moderate transcortical sensory aphasia. Pt's aphasia is c/b moderate  expressive deficits, mild receptive deficits, mild to  deficits in reading comprehension, spelling and written language.   Pt hopes to return to coaching basketball, therefore list of basketball related vocabulary words were created. Will target and include in longer utterances within future sessions. See treatment note for more details.   OBJECTIVE IMPAIRMENTS include expressive language, receptive language, apraxia, and reading comprehension and ability to produce written language . These impairments are limiting patient from return to work, managing medications, managing appointments, managing finances, household responsibilities, ADLs/IADLs, and effectively communicating at home and in community. Factors affecting potential to achieve goals and functional outcome are severity of impairments. Patient will benefit from skilled SLP services to address above impairments and improve overall function.   REHAB POTENTIAL: Excellent   PLAN: SLP FREQUENCY: 3x/week to 4xweek   SLP DURATION: 12 weeks   PLANNED INTERVENTIONS: Language facilitation, Cueing hierachy, Functional tasks, Multimodal communication approach, SLP instruction and feedback, Compensatory strategies, and Patient/family education     Serayah Yazdani B. Dreama Saa, M.S., CCC-SLP, Tree surgeon Certified Brain Injury Specialist [facility]  [facility] Services Office [phone] Ascom [phone] Fax [phone]

## 2023-02-08 ENCOUNTER — Ambulatory Visit: Payer: BC Managed Care – PPO | Admitting: Speech Pathology

## 2023-02-09 ENCOUNTER — Ambulatory Visit: Payer: BC Managed Care – PPO | Admitting: Speech Pathology

## 2023-02-10 ENCOUNTER — Telehealth: Payer: Self-pay | Admitting: Cardiovascular Disease

## 2023-02-10 MED ORDER — CARVEDILOL 12.5 MG PO TABS: 12.5000 mg | ORAL_TABLET | Freq: Two times a day (BID) | ORAL | 3 refills | Status: AC

## 2023-02-10 NOTE — Telephone Encounter (Signed)
*  STAT* If patient is at the pharmacy, call can be transferred to refill team.   1. Which medications need to be refilled? (please list name of each medication and dose if known) carvedilol (COREG) 12.5 MG tablet    2. Would you like to learn more about the convenience, safety, & potential cost savings by using the [facility]? No    3. Are you open to using the [facility] (Type [facility]. No    4. Which pharmacy/location (including street and city if local pharmacy) is medication to be sent to?  [id number] - [place], [place] - [address]     5. Do they need a 30 day or 90 day supply? 30 day   Patient only has a few tablets left.

## 2023-02-10 NOTE — Telephone Encounter (Signed)
Pt's medication was sent to pt's pharmacy as requested. Confirmation received.  °

## 2023-02-13 ENCOUNTER — Ambulatory Visit: Payer: BC Managed Care – PPO | Admitting: Speech Pathology

## 2023-02-13 ENCOUNTER — Ambulatory Visit (INDEPENDENT_AMBULATORY_CARE_PROVIDER_SITE_OTHER): Payer: BC Managed Care – PPO

## 2023-02-13 DIAGNOSIS — I639 Cerebral infarction, unspecified: Secondary | ICD-10-CM

## 2023-02-13 LAB — CUP PACEART REMOTE DEVICE CHECK
Date Time Interrogation Session: 20241020231814
Implantable Pulse Generator Implant Date: 20240425

## 2023-02-14 ENCOUNTER — Ambulatory Visit: Payer: BC Managed Care – PPO | Admitting: Speech Pathology

## 2023-02-15 ENCOUNTER — Ambulatory Visit: Payer: BC Managed Care – PPO | Admitting: Speech Pathology

## 2023-02-16 ENCOUNTER — Ambulatory Visit: Payer: BC Managed Care – PPO | Admitting: Speech Pathology

## 2023-02-20 ENCOUNTER — Ambulatory Visit: Payer: BC Managed Care – PPO | Admitting: Speech Pathology

## 2023-02-21 ENCOUNTER — Ambulatory Visit: Payer: BC Managed Care – PPO | Admitting: Speech Pathology

## 2023-02-22 ENCOUNTER — Ambulatory Visit: Payer: BC Managed Care – PPO | Admitting: Speech Pathology

## 2023-02-23 ENCOUNTER — Ambulatory Visit: Payer: BC Managed Care – PPO | Admitting: Speech Pathology

## 2023-02-27 ENCOUNTER — Ambulatory Visit: Payer: BC Managed Care – PPO | Attending: Physician Assistant | Admitting: Speech Pathology

## 2023-02-27 ENCOUNTER — Telehealth: Payer: Self-pay | Admitting: Speech Pathology

## 2023-02-27 DIAGNOSIS — R4701 Aphasia: Secondary | ICD-10-CM | POA: Insufficient documentation

## 2023-02-27 DIAGNOSIS — I63512 Cerebral infarction due to unspecified occlusion or stenosis of left middle cerebral artery: Secondary | ICD-10-CM | POA: Insufficient documentation

## 2023-02-27 DIAGNOSIS — R482 Apraxia: Secondary | ICD-10-CM | POA: Insufficient documentation

## 2023-02-27 NOTE — Telephone Encounter (Signed)
Pt was a no call/no show which is atypical for pt. This Clinical research associate texted with pt's wife as pt is aphasic. They didn't receive a reminder from [facility] therefore they overlooked the appt. They have confirmed the next appt on Wednesday at 3:30pm.   Shajuan Musso B. Dreama Saa, M.S., CCC-SLP, Tree surgeon Certified Brain Injury Specialist [facility]  [facility] Services Office [phone] Ascom [phone] Fax [phone]

## 2023-02-28 ENCOUNTER — Ambulatory Visit: Payer: BC Managed Care – PPO | Admitting: Speech Pathology

## 2023-02-28 NOTE — Progress Notes (Signed)
Carelink Summary Report / Loop Recorder 

## 2023-03-01 ENCOUNTER — Ambulatory Visit: Payer: BC Managed Care – PPO | Admitting: Speech Pathology

## 2023-03-01 DIAGNOSIS — I63512 Cerebral infarction due to unspecified occlusion or stenosis of left middle cerebral artery: Secondary | ICD-10-CM | POA: Diagnosis present

## 2023-03-01 DIAGNOSIS — R482 Apraxia: Secondary | ICD-10-CM | POA: Diagnosis present

## 2023-03-01 DIAGNOSIS — R4701 Aphasia: Secondary | ICD-10-CM

## 2023-03-02 ENCOUNTER — Ambulatory Visit: Payer: BC Managed Care – PPO | Admitting: Speech Pathology

## 2023-03-02 NOTE — Therapy (Signed)
OUTPATIENT SPEECH LANGUAGE PATHOLOGY  TREATMENT NOTE   Patient Name: ROSA GAMBALE MRN: [id number] DOB:[birth year], 57 y.o., male Today's Date: 03/02/2023   PCP: Myrene Buddy, NP REFERRING PROVIDER: Mariam Dollar, PA     End of Session - 03/02/23 1013     Visit Number 95    Number of Visits 114    Date for SLP Re-Evaluation 04/19/23    Authorization Type [employer]    Progress Note Due on Visit 100    SLP Start Time 1530    SLP Stop Time  1615    SLP Time Calculation (min) 45 min    Activity Tolerance Patient tolerated treatment well              Past Medical History:  Diagnosis Date   Hypertension    LVH (left ventricular hypertrophy)    Myocardial infarction (HCC)    Vitamin D deficiency    Past Surgical History:  Procedure Laterality Date   ANTERIOR CRUCIATE LIGAMENT REPAIR     COLONOSCOPY WITH PROPOFOL N/A 04/11/2018   Procedure: COLONOSCOPY WITH PROPOFOL;  Surgeon: Toledo, Boykin Nearing, MD;  Location: [facility] ENDOSCOPY;  Service: Gastroenterology;  Laterality: N/A;   GASTRIC RESECTION     IR ANGIO INTRA EXTRACRAN SEL INTERNAL CAROTID UNI L MOD SED  08/15/2022   IR US GUIDE VASC ACCESS RIGHT  08/15/2022   LAPAROTOMY     LOOP RECORDER INSERTION N/A 08/18/2022   Procedure: LOOP RECORDER INSERTION;  Surgeon: Regan Lemming, MD;  Location: [facility] INVASIVE CV LAB;  Service: Cardiovascular;  Laterality: N/A;   RADIOLOGY WITH ANESTHESIA N/A 08/14/2022   Procedure: IR WITH ANESTHESIA;  Surgeon: Julieanne Cotton, MD;  Location: [facility] OR;  Service: Radiology;  Laterality: N/A;   TEE WITHOUT CARDIOVERSION N/A 08/18/2022   Procedure: TRANSESOPHAGEAL ECHOCARDIOGRAM;  Surgeon: [name], MD;  Location: [facility] INVASIVE CV LAB;  Service: Cardiovascular;  Laterality: N/A;   Patient Active Problem List   Diagnosis Date Noted   Essential hypertension, benign 08/18/2022   Hyperlipidemia 08/18/2022   DM (diabetes mellitus), type 2 (HCC) 08/18/2022   Obesity  (BMI 30-39.9) 08/18/2022   CAD (coronary artery disease) 08/18/2022   OSA (obstructive sleep apnea) 08/18/2022   Acute ischemic left MCA stroke (HCC) 08/18/2022   Acute ischemic right MCA stroke (HCC) 08/14/2022   Hypertensive urgency 04/27/2017   ONSET DATE: 08/14/2022; date of referral 08/23/2022    REFERRING DIAG: X91.478 (ICD-10-CM) - Cerebral infarction due to unspecified occlusion or stenosis of left middle cerebral artery   THERAPY DIAG:  Aphasia   Apraxia   Cerebral infarction due to unspecified occlusion or stenosis of left middle cerebral artery (HCC)   Rationale for Evaluation and Treatment Rehabilitation   SUBJECTIVE:      PERTINENT HISTORY:  Cathan Gearin is 57 year old male who presented to the [facility] ED on 08/14/2022 noted by wife to be very weak and confused and halfway enter out of the bathtub at home. Symptoms were sudden in onset. Last known well 9 PM. Patient presented with global aphasia. Code stroke activated. CT head negative for hemorrhage, aspects of 9, mild hyperdensity left MCA could indicate thrombus. Tenecteplase administered. CTA showed left M1 occlusion and neurointerventional radiology contacted. Patient transferred to [facility] for thrombectomy. On route he was noted to have periorbital welts and uvular swelling. Was concern of possible angioedema secondary to tenecteplase. He was intubated for airway protection and underwent cerebral angiogram by Dr. Tommie Sams. Recanalization of the  left MCA vascular tree noted. Critical care medicine consulted for management and placed on low-dose Neo-Synephrine infusion. Extubated on 4/23. 2D echo with EF of approximately 60 to 65% with trivial MVR. LDL 96, hemoglobin A1c 6.3%. He was started on heparin subcutaneously for VTE prophylaxis.     DIAGNOSTIC FINDINGS:    CT Head - 08/14/2022 Mild hyperdensity of the left MCA   CT Head and Neck Angio - 08/14/2022 Proximal occlusion of the left MCA M1 segment  with poor collateralization throughout the MCA territory.   IR Angio - 08/15/2022 Interval recanalization of the left M1/MCA occlusion seen on prior CT angiogram likely as a result of TNK administration. Therefore, no intervention performed.   MRI 08/15/2022 Acute cortical infarcts along the left frontal operculum and left anterior insula with additional small area of acute cortical infarct within left parietal lobe. Other areas of faint cortical DWI hyperintensity throughout the left MCA territory, including the left putamen, may reflect additional areas of infarct or ischemia. 2. Loss of the left transverse sinus flow void is favored to reflect slow flow. If clinical suspicion for thrombosis, CT venography could be performed for further characterization.     PAIN:  Are you having pain? No   FALLS: Has patient fallen in last 6 months?  No   LIVING ENVIRONMENT: Lives with: lives with their family Lives in: House/apartment   PLOF:  Level of assistance: Independent with ADLs, Independent with IADLs Employment: Full-time employment     PATIENT GOALS    to be able to communicate again   SUBJECTIVE STATEMENT: Pt indicates that things are going well at work Pt accompanied by: self   OBJECTIVE:   TODAY'S TREATMENT:  Skilled treatment session focused on pt's communication goals. SLP facilitated session by providing the following interventions:  SLP provided motor speech practice utilizing list of clinically generated functional phrases related to basketball, basketball plays etc - with Moderate faded to minimal A to achieve ~ 80% speech intelligibility   PATIENT EDUCATION: Education details: see above Person educated: Patient and his wife via phone -  Education method: Explanation, pt took pictures Education comprehension: verbalized understanding and needs further education   HOME EXERCISE PROGRAM:  Complete TalkPath Therapy  Read/repeat functional phrases  Add  sentences/phrases with s-blends - pt continues to indicate using Text to Speech App that he is working with a co-worker for daily practice   GOALS:   Goals reviewed with patient? Yes   SHORT TERM GOALS: Target date: 10 sessions   SEE PREVIOUS NOTES FOR PROGRESS TOWARDS GOALS  Updated 01/25/2023 6.  With minimal cues, pt will demonstrate improved reading comprehension by following 2 step written directions with 75% accuracy.  Baseline:  Goal status: INITIAL: MET  7.  With minimal cues, pt will type phrase level basic functional statements (3-4 words in length) in 5 out of 7 opportunities.  Baseline:  Goal status: INITIAL:GOOD PROGRESS MADE 3 OUT OF 7 OPPORTUNITIES typing 2-3 word length  8.  With minimal cues, pt will imitate basic functional phrase level statements with 75% speech intelligibility.  Baseline:  Goal status: INITIAL: GREAT PROGRESS MADE: MET  LONG TERM GOALS: Target date: 01/26/2023   SEE PREVIOUS NOTES FOR PROGRESS TOWARDS GOALS  Updated 01/25/2023  Pt will use multimodal means of communication to express basic biographical information about himself, activities within his day and his family with minimal assistance: ongoing    2.  Pt will demonstrate > 90% speech intelligibility when reading orientation information.  Baseline:  Goal status: INITIAL Goal status: ONGOING; progress made  3. Pt will answer semi-complex yes/no questions with > 75% accuracy given minimal cues.   Baseline: 75% with simple yes/no questions  Goal status: INITIAL: MET     ASSESSMENT:   CLINICAL IMPRESSION: Pt is a 57 year old right handed male who was seen today for a speech-language treatment d/t moderate transcortical sensory aphasia. Pt's aphasia is c/b moderate expressive deficits, mild receptive deficits, mild to  deficits in reading comprehension, spelling and written language.   Pt hopes to return to coaching basketball, therefore list of basketball related vocabulary words were  created. Will target and include in longer utterances within future sessions. See treatment note for more details.   OBJECTIVE IMPAIRMENTS include expressive language, receptive language, apraxia, and reading comprehension and ability to produce written language . These impairments are limiting patient from return to work, managing medications, managing appointments, managing finances, household responsibilities, ADLs/IADLs, and effectively communicating at home and in community. Factors affecting potential to achieve goals and functional outcome are severity of impairments. Patient will benefit from skilled SLP services to address above impairments and improve overall function.   REHAB POTENTIAL: Excellent   PLAN: SLP FREQUENCY: 3x/week to 4xweek   SLP DURATION: 12 weeks   PLANNED INTERVENTIONS: Language facilitation, Cueing hierachy, Functional tasks, Multimodal communication approach, SLP instruction and feedback, Compensatory strategies, and Patient/family education     Mirha Brucato B. Dreama Saa, M.S., CCC-SLP, Tree surgeon Certified Brain Injury Specialist [facility]  [facility] Rehabilitation Services Office [phone] Ascom [phone] Fax [phone]

## 2023-03-06 ENCOUNTER — Ambulatory Visit: Payer: BC Managed Care – PPO | Admitting: Speech Pathology

## 2023-03-06 ENCOUNTER — Telehealth: Payer: Self-pay

## 2023-03-06 ENCOUNTER — Encounter: Payer: Self-pay | Admitting: Neurology

## 2023-03-06 NOTE — Telephone Encounter (Signed)
Returned patient called and Reschedule patient for f/u on 04/25/23 @11 :15am

## 2023-03-07 ENCOUNTER — Ambulatory Visit: Payer: BC Managed Care – PPO | Admitting: Speech Pathology

## 2023-03-07 ENCOUNTER — Ambulatory Visit: Payer: BC Managed Care – PPO | Admitting: Adult Health

## 2023-03-08 ENCOUNTER — Ambulatory Visit: Payer: BC Managed Care – PPO | Admitting: Speech Pathology

## 2023-03-08 DIAGNOSIS — R4701 Aphasia: Secondary | ICD-10-CM

## 2023-03-08 DIAGNOSIS — I63512 Cerebral infarction due to unspecified occlusion or stenosis of left middle cerebral artery: Secondary | ICD-10-CM

## 2023-03-09 ENCOUNTER — Ambulatory Visit: Payer: BC Managed Care – PPO | Admitting: Speech Pathology

## 2023-03-09 NOTE — Therapy (Signed)
OUTPATIENT SPEECH LANGUAGE PATHOLOGY  TREATMENT NOTE   Patient Name: [name] MRN: [id number] DOB:06-29-1965, 57 y.o., male Today's Date: 03/09/2023   PCP: Myrene Buddy, NP REFERRING PROVIDER: Mariam Dollar, PA     End of Session - 03/08/23 1538     Visit Number 96    Number of Visits 114    Date for SLP Re-Evaluation 04/19/23    Authorization Type [employer]    Progress Note Due on Visit 100    SLP Start Time 1530    SLP Stop Time  1615    SLP Time Calculation (min) 45 min    Activity Tolerance Patient tolerated treatment well              Past Medical History:  Diagnosis Date   Hypertension    LVH (left ventricular hypertrophy)    Myocardial infarction (HCC)    Vitamin D deficiency    Past Surgical History:  Procedure Laterality Date   ANTERIOR CRUCIATE LIGAMENT REPAIR     COLONOSCOPY WITH PROPOFOL N/A 04/11/2018   Procedure: COLONOSCOPY WITH PROPOFOL;  Surgeon: Toledo, Boykin Nearing, MD;  Location: [facility] ENDOSCOPY;  Service: Gastroenterology;  Laterality: N/A;   GASTRIC RESECTION     IR ANGIO INTRA EXTRACRAN SEL INTERNAL CAROTID UNI L MOD SED  08/15/2022   IR US GUIDE VASC ACCESS RIGHT  08/15/2022   LAPAROTOMY     LOOP RECORDER INSERTION N/A 08/18/2022   Procedure: LOOP RECORDER INSERTION;  Surgeon: Regan Lemming, MD;  Location: [facility] INVASIVE CV LAB;  Service: Cardiovascular;  Laterality: N/A;   RADIOLOGY WITH ANESTHESIA N/A 08/14/2022   Procedure: IR WITH ANESTHESIA;  Surgeon: Julieanne Cotton, MD;  Location: [facility] OR;  Service: Radiology;  Laterality: N/A;   TEE WITHOUT CARDIOVERSION N/A 08/18/2022   Procedure: TRANSESOPHAGEAL ECHOCARDIOGRAM;  Surgeon: [name], MD;  Location: [facility] INVASIVE CV LAB;  Service: Cardiovascular;  Laterality: N/A;   Patient Active Problem List   Diagnosis Date Noted   Essential hypertension, benign 08/18/2022   Hyperlipidemia 08/18/2022   DM (diabetes mellitus), type 2 (HCC) 08/18/2022   Obesity  (BMI 30-39.9) 08/18/2022   CAD (coronary artery disease) 08/18/2022   OSA (obstructive sleep apnea) 08/18/2022   Acute ischemic left MCA stroke (HCC) 08/18/2022   Acute ischemic right MCA stroke (HCC) 08/14/2022   Hypertensive urgency 04/27/2017   ONSET DATE: 08/14/2022; date of referral 08/23/2022    REFERRING DIAG: G29.528 (ICD-10-CM) - Cerebral infarction due to unspecified occlusion or stenosis of left middle cerebral artery   THERAPY DIAG:  Aphasia   Apraxia   Cerebral infarction due to unspecified occlusion or stenosis of left middle cerebral artery (HCC)   Rationale for Evaluation and Treatment Rehabilitation   SUBJECTIVE:      PERTINENT HISTORY:  [name] is 57 year old male who presented to the [facility] ED on 08/14/2022 noted by wife to be very weak and confused and halfway enter out of the bathtub at home. Symptoms were sudden in onset. Last known well 9 PM. Patient presented with global aphasia. Code stroke activated. CT head negative for hemorrhage, aspects of 9, mild hyperdensity left MCA could indicate thrombus. Tenecteplase administered. CTA showed left M1 occlusion and neurointerventional radiology contacted. Patient transferred to [facility] for thromb[facility]. On route he was noted to have periorbital welts and uvular swelling. Was concern of possible angioedema secondary to tenecteplase. He was intubated for airway protection and underwent cerebral angiogram by Dr. Tommie Sams. Recanalization of the  left MCA vascular tree noted. Critical care medicine consulted for management and placed on low-dose Neo-Synephrine infusion. Extubated on 4/23. 2D echo with EF of approximately 60 to 65% with trivial MVR. LDL 96, hemoglobin A1c 6.3%. He was started on heparin subcutaneously for VTE prophylaxis.     DIAGNOSTIC FINDINGS:    CT Head - 08/14/2022 Mild hyperdensity of the left MCA   CT Head and Neck Angio - 08/14/2022 Proximal occlusion of the left MCA M1 segment  with poor collateralization throughout the MCA territory.   IR Angio - 08/15/2022 Interval recanalization of the left M1/MCA occlusion seen on prior CT angiogram likely as a result of TNK administration. Therefore, no intervention performed.   MRI 08/15/2022 Acute cortical infarcts along the left frontal operculum and left anterior insula with additional small area of acute cortical infarct within left parietal lobe. Other areas of faint cortical DWI hyperintensity throughout the left MCA territory, including the left putamen, may reflect additional areas of infarct or ischemia. 2. Loss of the left transverse sinus flow void is favored to reflect slow flow. If clinical suspicion for thrombosis, CT venography could be performed for further characterization.     PAIN:  Are you having pain? No   FALLS: Has patient fallen in last 6 months?  No   LIVING ENVIRONMENT: Lives with: lives with their family Lives in: House/apartment   PLOF:  Level of assistance: Independent with ADLs, Independent with IADLs Employment: Full-time employment     PATIENT GOALS    to be able to communicate again   SUBJECTIVE STATEMENT: Pt demonstrating different basketball moves to supplement expressive language Pt accompanied by: self   OBJECTIVE:   TODAY'S TREATMENT:  Skilled treatment session focused on pt's communication goals. SLP facilitated session by providing the following interventions:  SLP provided motor speech practice utilizing list of clinically generated functional phrases related to basketball, basketball plays etc - with supervision level cues, pt able to demonstrate > 95% speech intelligibility. SLP also facilitated by creating sentence level utterances containing the above list of words. Pt with continued improve repetition at 90%. He benefits from heavier cuing when producing consonant blends (s blends).    PATIENT EDUCATION: Education details: see above Person educated: Patient  and his wife via phone -  Education method: Explanation, pt took pictures Education comprehension: verbalized understanding and needs further education   HOME EXERCISE PROGRAM:  Complete TalkPath Therapy  Read/repeat functional phrases  Add sentences/phrases with s-blends - pt continues to indicate using Text to Speech App that he is working with a co-worker for daily practice   GOALS:   Goals reviewed with patient? Yes   SHORT TERM GOALS: Target date: 10 sessions   SEE PREVIOUS NOTES FOR PROGRESS TOWARDS GOALS  Updated 01/25/2023 6.  With minimal cues, pt will demonstrate improved reading comprehension by following 2 step written directions with 75% accuracy.  Baseline:  Goal status: INITIAL: MET  7.  With minimal cues, pt will type phrase level basic functional statements (3-4 words in length) in 5 out of 7 opportunities.  Baseline:  Goal status: INITIAL:GOOD PROGRESS MADE 3 OUT OF 7 OPPORTUNITIES typing 2-3 word length  8.  With minimal cues, pt will imitate basic functional phrase level statements with 75% speech intelligibility.  Baseline:  Goal status: INITIAL: GREAT PROGRESS MADE: MET  LONG TERM GOALS: Target date: 01/26/2023   SEE PREVIOUS NOTES FOR PROGRESS TOWARDS GOALS  Updated 01/25/2023  Pt will use multimodal means of communication to express basic  biographical information about himself, activities within his day and his family with minimal assistance: ongoing    2.  Pt will demonstrate > 90% speech intelligibility when reading orientation information.  Baseline:  Goal status: INITIAL Goal status: ONGOING; progress made  3. Pt will answer semi-complex yes/no questions with > 75% accuracy given minimal cues.   Baseline: 75% with simple yes/no questions  Goal status: INITIAL: MET     ASSESSMENT:   CLINICAL IMPRESSION: Pt is a 57 year old right handed male who was seen today for a speech-language treatment d/t moderate transcortical sensory aphasia. Pt's  aphasia is c/b moderate expressive deficits, mild receptive deficits, mild to  deficits in reading comprehension, spelling and written language.   Pt hopes to return to coaching basketball, therefore list of basketball related vocabulary words were created. Pt produced with improve speech intelligibility when at the sentence level. See treatment note for more details.   OBJECTIVE IMPAIRMENTS include expressive language, receptive language, apraxia, and reading comprehension and ability to produce written language . These impairments are limiting patient from return to work, managing medications, managing appointments, managing finances, household responsibilities, ADLs/IADLs, and effectively communicating at home and in community. Factors affecting potential to achieve goals and functional outcome are severity of impairments. Patient will benefit from skilled SLP services to address above impairments and improve overall function.   REHAB POTENTIAL: Excellent   PLAN: SLP FREQUENCY: 3x/week to 4xweek   SLP DURATION: 12 weeks   PLANNED INTERVENTIONS: Language facilitation, Cueing hierachy, Functional tasks, Multimodal communication approach, SLP instruction and feedback, Compensatory strategies, and Patient/family education     Parys Elenbaas B. Dreama Saa, M.S., CCC-SLP, Tree surgeon Certified Brain Injury Specialist [facility]  [facility] Rehabilitation Services Office [phone] Ascom [phone] Fax [phone]

## 2023-03-13 ENCOUNTER — Ambulatory Visit: Payer: BC Managed Care – PPO | Admitting: Speech Pathology

## 2023-03-14 ENCOUNTER — Ambulatory Visit: Payer: BC Managed Care – PPO | Admitting: Speech Pathology

## 2023-03-15 ENCOUNTER — Ambulatory Visit: Payer: BC Managed Care – PPO | Admitting: Speech Pathology

## 2023-03-15 DIAGNOSIS — R4701 Aphasia: Secondary | ICD-10-CM

## 2023-03-15 DIAGNOSIS — I63512 Cerebral infarction due to unspecified occlusion or stenosis of left middle cerebral artery: Secondary | ICD-10-CM

## 2023-03-15 DIAGNOSIS — R482 Apraxia: Secondary | ICD-10-CM

## 2023-03-15 NOTE — Therapy (Unsigned)
OUTPATIENT SPEECH LANGUAGE PATHOLOGY  TREATMENT NOTE   Patient Name: [name] MRN: [id number] DOB:[birth year], 57 y.o., male Today's Date: 03/15/2023   PCP: Myrene Buddy, NP REFERRING PROVIDER: Mariam Dollar, PA     End of Session - 03/15/23 1623     Visit Number 97    Number of Visits 114    Date for SLP Re-Evaluation 04/19/23    Authorization Type [employer]    Progress Note Due on Visit 100    SLP Start Time 1540    SLP Stop Time  1615    SLP Time Calculation (min) 35 min    Activity Tolerance Patient tolerated treatment well              Past Medical History:  Diagnosis Date   Hypertension    LVH (left ventricular hypertrophy)    Myocardial infarction (HCC)    Vitamin D deficiency    Past Surgical History:  Procedure Laterality Date   ANTERIOR CRUCIATE LIGAMENT REPAIR     COLONOSCOPY WITH PROPOFOL N/A 04/11/2018   Procedure: COLONOSCOPY WITH PROPOFOL;  Surgeon: Toledo, Boykin Nearing, MD;  Location: [facility] ENDOSCOPY;  Service: Gastroenterology;  Laterality: N/A;   GASTRIC RESECTION     IR ANGIO INTRA EXTRACRAN SEL INTERNAL CAROTID UNI L MOD SED  08/15/2022   IR US GUIDE VASC ACCESS RIGHT  08/15/2022   LAPAROTOMY     LOOP RECORDER INSERTION N/A 08/18/2022   Procedure: LOOP RECORDER INSERTION;  Surgeon: Regan Lemming, MD;  Location: [facility] INVASIVE CV LAB;  Service: Cardiovascular;  Laterality: N/A;   RADIOLOGY WITH ANESTHESIA N/A 08/14/2022   Procedure: IR WITH ANESTHESIA;  Surgeon: Julieanne Cotton, MD;  Location: [facility] OR;  Service: Radiology;  Laterality: N/A;   TEE WITHOUT CARDIOVERSION N/A 08/18/2022   Procedure: TRANSESOPHAGEAL ECHOCARDIOGRAM;  Surgeon: [name], MD;  Location: [facility] INVASIVE CV LAB;  Service: Cardiovascular;  Laterality: N/A;   Patient Active Problem List   Diagnosis Date Noted   Essential hypertension, benign 08/18/2022   Hyperlipidemia 08/18/2022   DM (diabetes mellitus), type 2 (HCC) 08/18/2022   Obesity  (BMI 30-39.9) 08/18/2022   CAD (coronary artery disease) 08/18/2022   OSA (obstructive sleep apnea) 08/18/2022   Acute ischemic left MCA stroke (HCC) 08/18/2022   Acute ischemic right MCA stroke (HCC) 08/14/2022   Hypertensive urgency 04/27/2017   ONSET DATE: 08/14/2022; date of referral 08/23/2022    REFERRING DIAG: L87.564 (ICD-10-CM) - Cerebral infarction due to unspecified occlusion or stenosis of left middle cerebral artery   THERAPY DIAG:  Aphasia   Apraxia   Cerebral infarction due to unspecified occlusion or stenosis of left middle cerebral artery (HCC)   Rationale for Evaluation and Treatment Rehabilitation   SUBJECTIVE:      PERTINENT HISTORY:  [name] is 57 year old male who presented to the [facility] ED on 08/14/2022 noted by wife to be very weak and confused and halfway enter out of the bathtub at home. Symptoms were sudden in onset. Last known well 9 PM. Patient presented with global aphasia. Code stroke activated. CT head negative for hemorrhage, aspects of 9, mild hyperdensity left MCA could indicate thrombus. Tenecteplase administered. CTA showed left M1 occlusion and neurointerventional radiology contacted. Patient transferred to [facility] for thrombectomy. On route he was noted to have periorbital welts and uvular swelling. Was concern of possible angioedema secondary to tenecteplase. He was intubated for airway protection and underwent cerebral angiogram by Dr. Tommie Sams. Recanalization of the  left MCA vascular tree noted. Critical care medicine consulted for management and placed on low-dose Neo-Synephrine infusion. Extubated on 4/23. 2D echo with EF of approximately 60 to 65% with trivial MVR. LDL 96, hemoglobin A1c 6.3%. He was started on heparin subcutaneously for VTE prophylaxis.     DIAGNOSTIC FINDINGS:    CT Head - 08/14/2022 Mild hyperdensity of the left MCA   CT Head and Neck Angio - 08/14/2022 Proximal occlusion of the left MCA M1 segment  with poor collateralization throughout the MCA territory.   IR Angio - 08/15/2022 Interval recanalization of the left M1/MCA occlusion seen on prior CT angiogram likely as a result of TNK administration. Therefore, no intervention performed.   MRI 08/15/2022 Acute cortical infarcts along the left frontal operculum and left anterior insula with additional small area of acute cortical infarct within left parietal lobe. Other areas of faint cortical DWI hyperintensity throughout the left MCA territory, including the left putamen, may reflect additional areas of infarct or ischemia. 2. Loss of the left transverse sinus flow void is favored to reflect slow flow. If clinical suspicion for thrombosis, CT venography could be performed for further characterization.     PAIN:  Are you having pain? No   FALLS: Has patient fallen in last 6 months?  No   LIVING ENVIRONMENT: Lives with: lives with their family Lives in: House/apartment   PLOF:  Level of assistance: Independent with ADLs, Independent with IADLs Employment: Full-time employment     PATIENT GOALS    to be able to communicate again   SUBJECTIVE STATEMENT: Pt handed this Clinical research associate a same card from his wallet with different basketball plays on card Pt accompanied by: self   OBJECTIVE:   TODAY'S TREATMENT:  Skilled treatment session focused on pt's communication goals. SLP facilitated session by providing the following interventions:  This Clinical research associate used the above mentioned card to have pt repeat the plays listed as wellas demonstrated the hand signal for each. Pt able to repeat each independently with > 95% accuracy. Also when SLP demonstrated the hand signal, pt able to name with 75% accuracy and 75% speech intelligibility. Written instructions provided for pt to practice the above with his wife.    PATIENT EDUCATION: Education details: see above Person educated: Patient and his wife via phone -  Education method:  Explanation, pt took pictures Education comprehension: verbalized understanding and needs further education   HOME EXERCISE PROGRAM:  Target basketball plays thru repetition and naming of hand signals.    GOALS:   Goals reviewed with patient? Yes   SHORT TERM GOALS: Target date: 10 sessions   SEE PREVIOUS NOTES FOR PROGRESS TOWARDS GOALS  Updated 01/25/2023 6.  With minimal cues, pt will demonstrate improved reading comprehension by following 2 step written directions with 75% accuracy.  Baseline:  Goal status: INITIAL: MET  7.  With minimal cues, pt will type phrase level basic functional statements (3-4 words in length) in 5 out of 7 opportunities.  Baseline:  Goal status: INITIAL:GOOD PROGRESS MADE 3 OUT OF 7 OPPORTUNITIES typing 2-3 word length  8.  With minimal cues, pt will imitate basic functional phrase level statements with 75% speech intelligibility.  Baseline:  Goal status: INITIAL: GREAT PROGRESS MADE: MET  LONG TERM GOALS: Target date: 01/26/2023   SEE PREVIOUS NOTES FOR PROGRESS TOWARDS GOALS  Updated 01/25/2023  Pt will use multimodal means of communication to express basic biographical information about himself, activities within his day and his family with minimal assistance: ongoing  2.  Pt will demonstrate > 90% speech intelligibility when reading orientation information.  Baseline:  Goal status: INITIAL Goal status: ONGOING; progress made  3. Pt will answer semi-complex yes/no questions with > 75% accuracy given minimal cues.   Baseline: 75% with simple yes/no questions  Goal status: INITIAL: MET     ASSESSMENT:   CLINICAL IMPRESSION: Pt is a 57 year old right handed male who was seen today for a speech-language treatment d/t moderate transcortical sensory aphasia. Pt's aphasia is c/b moderate expressive deficits, mild receptive deficits, mild to  deficits in reading comprehension, spelling and written language.   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 See treatment note for more details.   OBJECTIVE IMPAIRMENTS include expressive language, receptive language, apraxia, and reading comprehension and ability to produce written language . These impairments are limiting patient from return to work, managing medications, managing appointments, managing finances, household responsibilities, ADLs/IADLs, and effectively communicating at home and in community. Factors affecting potential to achieve goals and functional outcome are severity of impairments. Patient will benefit from skilled SLP services to address above impairments and improve overall function.   REHAB POTENTIAL: Excellent   PLAN: SLP FREQUENCY: 3x/week to 4xweek   SLP DURATION: 12 weeks   PLANNED INTERVENTIONS: Language facilitation, Cueing hierachy, Functional tasks, Multimodal communication approach, SLP instruction and feedback, Compensatory strategies, and Patient/family education     Lian Pounds B. Dreama Saa, M.S., CCC-SLP, Tree surgeon Certified Brain Injury Specialist [facility]  [facility] Rehabilitation Services Office [phone] Ascom [phone] Fax [phone]

## 2023-03-16 ENCOUNTER — Ambulatory Visit: Payer: BC Managed Care – PPO | Admitting: Speech Pathology

## 2023-03-20 ENCOUNTER — Ambulatory Visit: Payer: BC Managed Care – PPO

## 2023-03-20 ENCOUNTER — Ambulatory Visit: Payer: BC Managed Care – PPO | Admitting: Speech Pathology

## 2023-03-20 DIAGNOSIS — R482 Apraxia: Secondary | ICD-10-CM

## 2023-03-20 DIAGNOSIS — I639 Cerebral infarction, unspecified: Secondary | ICD-10-CM | POA: Diagnosis not present

## 2023-03-20 DIAGNOSIS — I63512 Cerebral infarction due to unspecified occlusion or stenosis of left middle cerebral artery: Secondary | ICD-10-CM

## 2023-03-20 DIAGNOSIS — R4701 Aphasia: Secondary | ICD-10-CM

## 2023-03-20 LAB — CUP PACEART REMOTE DEVICE CHECK
Date Time Interrogation Session: 20241122230820
Implantable Pulse Generator Implant Date: 20240425

## 2023-03-20 NOTE — Therapy (Signed)
OUTPATIENT SPEECH LANGUAGE PATHOLOGY  TREATMENT NOTE   Patient Name: [name] MRN: [id number] DOB:06-Sep-1965, 57 y.o., male Today's Date: 03/20/2023   PCP: Myrene Buddy, NP REFERRING PROVIDER: Mariam Dollar, PA     End of Session - 03/20/23 1602     Visit Number 98    Number of Visits 114    Date for SLP Re-Evaluation 04/19/23    Authorization Type [employer]    Progress Note Due on Visit 100    SLP Start Time 1540    SLP Stop Time  1610    SLP Time Calculation (min) 30 min    Activity Tolerance Patient tolerated treatment well              Past Medical History:  Diagnosis Date   Hypertension    LVH (left ventricular hypertrophy)    Myocardial infarction (HCC)    Vitamin D deficiency    Past Surgical History:  Procedure Laterality Date   ANTERIOR CRUCIATE LIGAMENT REPAIR     COLONOSCOPY WITH PROPOFOL N/A 04/11/2018   Procedure: COLONOSCOPY WITH PROPOFOL;  Surgeon: Toledo, Boykin Nearing, MD;  Location: [facility] ENDOSCOPY;  Service: Gastroenterology;  Laterality: N/A;   GASTRIC RESECTION     IR ANGIO INTRA EXTRACRAN SEL INTERNAL CAROTID UNI L MOD SED  08/15/2022   IR US GUIDE VASC ACCESS RIGHT  08/15/2022   LAPAROTOMY     LOOP RECORDER INSERTION N/A 08/18/2022   Procedure: LOOP RECORDER INSERTION;  Surgeon: Regan Lemming, MD;  Location: [facility] INVASIVE CV LAB;  Service: Cardiovascular;  Laterality: N/A;   RADIOLOGY WITH ANESTHESIA N/A 08/14/2022   Procedure: IR WITH ANESTHESIA;  Surgeon: Julieanne Cotton, MD;  Location: [facility] OR;  Service: Radiology;  Laterality: N/A;   TEE WITHOUT CARDIOVERSION N/A 08/18/2022   Procedure: TRANSESOPHAGEAL ECHOCARDIOGRAM;  Surgeon: [name], MD;  Location: [facility] INVASIVE CV LAB;  Service: Cardiovascular;  Laterality: N/A;   Patient Active Problem List   Diagnosis Date Noted   Essential hypertension, benign 08/18/2022   Hyperlipidemia 08/18/2022   DM (diabetes mellitus), type 2 (HCC) 08/18/2022   Obesity  (BMI 30-39.9) 08/18/2022   CAD (coronary artery disease) 08/18/2022   OSA (obstructive sleep apnea) 08/18/2022   Acute ischemic left MCA stroke (HCC) 08/18/2022   Acute ischemic right MCA stroke (HCC) 08/14/2022   Hypertensive urgency 04/27/2017   ONSET DATE: 08/14/2022; date of referral 08/23/2022    REFERRING DIAG: W09.811 (ICD-10-CM) - Cerebral infarction due to unspecified occlusion or stenosis of left middle cerebral artery   THERAPY DIAG:  Aphasia   Apraxia   Cerebral infarction due to unspecified occlusion or stenosis of left middle cerebral artery (HCC)   Rationale for Evaluation and Treatment Rehabilitation   SUBJECTIVE:      PERTINENT HISTORY:  [name] is 57 year old male who presented to the [facility] ED on 08/14/2022 noted by wife to be very weak and confused and halfway enter out of the bathtub at home. Symptoms were sudden in onset. Last known well 9 PM. Patient presented with global aphasia. Code stroke activated. CT head negative for hemorrhage, aspects of 9, mild hyperdensity left MCA could indicate thrombus. Tenecteplase administered. CTA showed left M1 occlusion and neurointerventional radiology contacted. Patient transferred to [facility] for thrombectomy. On route he was noted to have periorbital welts and uvular swelling. Was concern of possible angioedema secondary to tenecteplase. He was intubated for airway protection and underwent cerebral angiogram by Dr. Tommie Sams. Recanalization of the  left MCA vascular tree noted. Critical care medicine consulted for management and placed on low-dose Neo-Synephrine infusion. Extubated on 4/23. 2D echo with EF of approximately 60 to 65% with trivial MVR. LDL 96, hemoglobin A1c 6.3%. He was started on heparin subcutaneously for VTE prophylaxis.     DIAGNOSTIC FINDINGS:    CT Head - 08/14/2022 Mild hyperdensity of the left MCA   CT Head and Neck Angio - 08/14/2022 Proximal occlusion of the left MCA M1 segment  with poor collateralization throughout the MCA territory.   IR Angio - 08/15/2022 Interval recanalization of the left M1/MCA occlusion seen on prior CT angiogram likely as a result of TNK administration. Therefore, no intervention performed.   MRI 08/15/2022 Acute cortical infarcts along the left frontal operculum and left anterior insula with additional small area of acute cortical infarct within left parietal lobe. Other areas of faint cortical DWI hyperintensity throughout the left MCA territory, including the left putamen, may reflect additional areas of infarct or ischemia. 2. Loss of the left transverse sinus flow void is favored to reflect slow flow. If clinical suspicion for thrombosis, CT venography could be performed for further characterization.     PAIN:  Are you having pain? No   FALLS: Has patient fallen in last 6 months?  No   LIVING ENVIRONMENT: Lives with: lives with their family Lives in: House/apartment   PLOF:  Level of assistance: Independent with ADLs, Independent with IADLs Employment: Full-time employment     PATIENT GOALS    to be able to communicate again   SUBJECTIVE STATEMENT: "Happy Thanksgiving" Pt accompanied by: self   OBJECTIVE:   TODAY'S TREATMENT:  Skilled treatment session focused on pt's communication goals. SLP facilitated session by providing the following interventions:  This Clinical research associate used the above mentioned card to have pt repeat the plays listed as wellas demonstrated the hand signal for each. Pt able to repeat each independently with > 95% accuracy. Also when SLP demonstrated the hand signal, pt able to name with 90% accuracy and 90% speech intelligibility. Written instructions provided for pt to practice the above with his wife.    PATIENT EDUCATION: Education details: see above Person educated: Patient and his wife via phone -  Education method: Explanation, pt took pictures Education comprehension: verbalized understanding  and needs further education   HOME EXERCISE PROGRAM:  Target basketball plays thru repetition and naming of hand signals.    GOALS:   Goals reviewed with patient? Yes   SHORT TERM GOALS: Target date: 10 sessions   SEE PREVIOUS NOTES FOR PROGRESS TOWARDS GOALS  Updated 01/25/2023 6.  With minimal cues, pt will demonstrate improved reading comprehension by following 2 step written directions with 75% accuracy.  Baseline:  Goal status: INITIAL: MET  7.  With minimal cues, pt will type phrase level basic functional statements (3-4 words in length) in 5 out of 7 opportunities.  Baseline:  Goal status: INITIAL:GOOD PROGRESS MADE 3 OUT OF 7 OPPORTUNITIES typing 2-3 word length  8.  With minimal cues, pt will imitate basic functional phrase level statements with 75% speech intelligibility.  Baseline:  Goal status: INITIAL: GREAT PROGRESS MADE: MET  LONG TERM GOALS: Target date: 01/26/2023   SEE PREVIOUS NOTES FOR PROGRESS TOWARDS GOALS  Updated 01/25/2023  Pt will use multimodal means of communication to express basic biographical information about himself, activities within his day and his family with minimal assistance: ongoing    2.  Pt will demonstrate > 90% speech intelligibility when reading  orientation information.  Baseline:  Goal status: INITIAL Goal status: ONGOING; progress made  3. Pt will answer semi-complex yes/no questions with > 75% accuracy given minimal cues.   Baseline: 75% with simple yes/no questions  Goal status: INITIAL: MET     ASSESSMENT:   CLINICAL IMPRESSION: Pt is a 57 year old right handed male who was seen today for a speech-language treatment d/t moderate transcortical sensory aphasia. Pt's aphasia is c/b moderate expressive deficits, mild receptive deficits, mild to  deficits in reading comprehension, spelling and written language.   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 See treatment note for more details.   OBJECTIVE IMPAIRMENTS include expressive language, receptive language, apraxia, and reading comprehension and ability to produce written language . These impairments are limiting patient from return to work, managing medications, managing appointments, managing finances, household responsibilities, ADLs/IADLs, and effectively communicating at home and in community. Factors affecting potential to achieve goals and functional outcome are severity of impairments. Patient will benefit from skilled SLP services to address above impairments and improve overall function.   REHAB POTENTIAL: Excellent   PLAN: SLP FREQUENCY: 3x/week to 4xweek   SLP DURATION: 12 weeks   PLANNED INTERVENTIONS: Language facilitation, Cueing hierachy, Functional tasks, Multimodal communication approach, SLP instruction and feedback, Compensatory strategies, and Patient/family education     Cristian Davitt B. Dreama Saa, M.S., CCC-SLP, Tree surgeon Certified Brain Injury Specialist [facility] Rehabilitation Services Office [phone] Ascom [phone] Fax [phone]

## 2023-03-21 ENCOUNTER — Ambulatory Visit: Payer: BC Managed Care – PPO | Admitting: Speech Pathology

## 2023-03-22 ENCOUNTER — Ambulatory Visit: Payer: BC Managed Care – PPO | Admitting: Speech Pathology

## 2023-03-27 ENCOUNTER — Ambulatory Visit: Payer: BC Managed Care – PPO | Attending: Physician Assistant | Admitting: Speech Pathology

## 2023-03-27 DIAGNOSIS — R482 Apraxia: Secondary | ICD-10-CM | POA: Diagnosis present

## 2023-03-27 DIAGNOSIS — I63512 Cerebral infarction due to unspecified occlusion or stenosis of left middle cerebral artery: Secondary | ICD-10-CM | POA: Insufficient documentation

## 2023-03-27 DIAGNOSIS — R4701 Aphasia: Secondary | ICD-10-CM | POA: Insufficient documentation

## 2023-03-27 NOTE — Therapy (Signed)
OUTPATIENT SPEECH LANGUAGE PATHOLOGY  TREATMENT NOTE   Patient Name: [name] MRN: [id number] DOB:04-20-1966, 57 y.o., male Today's Date: 03/27/2023   PCP: Myrene Buddy, NP REFERRING PROVIDER: Mariam Dollar, PA     End of Session - 03/27/23 1606     Visit Number 99    Number of Visits 114    Date for SLP Re-Evaluation 04/19/23    Authorization Type [employer]    Progress Note Due on Visit 100    SLP Start Time 1540    SLP Stop Time  1615    SLP Time Calculation (min) 35 min    Activity Tolerance Patient tolerated treatment well              Past Medical History:  Diagnosis Date   Hypertension    LVH (left ventricular hypertrophy)    Myocardial infarction (HCC)    Vitamin D deficiency    Past Surgical History:  Procedure Laterality Date   ANTERIOR CRUCIATE LIGAMENT REPAIR     COLONOSCOPY WITH PROPOFOL N/A 04/11/2018   Procedure: COLONOSCOPY WITH PROPOFOL;  Surgeon: Toledo, Boykin Nearing, MD;  Location: [facility] ENDOSCOPY;  Service: Gastroenterology;  Laterality: N/A;   GASTRIC RESECTION     IR ANGIO INTRA EXTRACRAN SEL INTERNAL CAROTID UNI L MOD SED  08/15/2022   IR US GUIDE VASC ACCESS RIGHT  08/15/2022   LAPAROTOMY     LOOP RECORDER INSERTION N/A 08/18/2022   Procedure: LOOP RECORDER INSERTION;  Surgeon: Regan Lemming, MD;  Location: [facility] INVASIVE CV LAB;  Service: Cardiovascular;  Laterality: N/A;   RADIOLOGY WITH ANESTHESIA N/A 08/14/2022   Procedure: IR WITH ANESTHESIA;  Surgeon: Julieanne Cotton, MD;  Location: [facility] OR;  Service: Radiology;  Laterality: N/A;   TEE WITHOUT CARDIOVERSION N/A 08/18/2022   Procedure: TRANSESOPHAGEAL ECHOCARDIOGRAM;  Surgeon: [name], MD;  Location: [facility] INVASIVE CV LAB;  Service: Cardiovascular;  Laterality: N/A;   Patient Active Problem List   Diagnosis Date Noted   Essential hypertension, benign 08/18/2022   Hyperlipidemia 08/18/2022   DM (diabetes mellitus), type 2 (HCC) 08/18/2022   Obesity  (BMI 30-39.9) 08/18/2022   CAD (coronary artery disease) 08/18/2022   OSA (obstructive sleep apnea) 08/18/2022   Acute ischemic left MCA stroke (HCC) 08/18/2022   Acute ischemic right MCA stroke (HCC) 08/14/2022   Hypertensive urgency 04/27/2017   ONSET DATE: 08/14/2022; date of referral 08/23/2022    REFERRING DIAG: X52.841 (ICD-10-CM) - Cerebral infarction due to unspecified occlusion or stenosis of left middle cerebral artery   THERAPY DIAG:  Aphasia   Apraxia   Cerebral infarction due to unspecified occlusion or stenosis of left middle cerebral artery (HCC)   Rationale for Evaluation and Treatment Rehabilitation   SUBJECTIVE:      PERTINENT HISTORY:  [name] is 57 year old male who presented to the [facility] ED on 08/14/2022 noted by wife to be very weak and confused and halfway enter out of the bathtub at home. Symptoms were sudden in onset. Last known well 9 PM. Patient presented with global aphasia. Code stroke activated. CT head negative for hemorrhage, aspects of 9, mild hyperdensity left MCA could indicate thrombus. Tenecteplase administered. CTA showed left M1 occlusion and neurointerventional radiology contacted. Patient transferred to [facility] for thrombectomy. On route he was noted to have periorbital welts and uvular swelling. Was concern of possible angioedema secondary to tenecteplase. He was intubated for airway protection and underwent cerebral angiogram by Dr. Tommie Sams. Recanalization of the  left MCA vascular tree noted. Critical care medicine consulted for management and placed on low-dose Neo-Synephrine infusion. Extubated on 4/23. 2D echo with EF of approximately 60 to 65% with trivial MVR. LDL 96, hemoglobin A1c 6.3%. He was started on heparin subcutaneously for VTE prophylaxis.     DIAGNOSTIC FINDINGS:    CT Head - 08/14/2022 Mild hyperdensity of the left MCA   CT Head and Neck Angio - 08/14/2022 Proximal occlusion of the left MCA M1 segment  with poor collateralization throughout the MCA territory.   IR Angio - 08/15/2022 Interval recanalization of the left M1/MCA occlusion seen on prior CT angiogram likely as a result of TNK administration. Therefore, no intervention performed.   MRI 08/15/2022 Acute cortical infarcts along the left frontal operculum and left anterior insula with additional small area of acute cortical infarct within left parietal lobe. Other areas of faint cortical DWI hyperintensity throughout the left MCA territory, including the left putamen, may reflect additional areas of infarct or ischemia. 2. Loss of the left transverse sinus flow void is favored to reflect slow flow. If clinical suspicion for thrombosis, CT venography could be performed for further characterization.     PAIN:  Are you having pain? No   FALLS: Has patient fallen in last 6 months?  No   LIVING ENVIRONMENT: Lives with: lives with their family Lives in: House/apartment   PLOF:  Level of assistance: Independent with ADLs, Independent with IADLs Employment: Full-time employment     PATIENT GOALS    to be able to communicate again   SUBJECTIVE STATEMENT: Pt indicated that he had a good thanksgiving and that his mother isn't doing well Pt accompanied by: self   OBJECTIVE:   TODAY'S TREATMENT:  Skilled treatment session focused on pt's communication goals. SLP facilitated session by providing the following interventions:  Functional Repetition of basketball terms and phrases - > 95% Independently named items that he ate for Thanksgiving x 5 items Improved words/phrases heard x 5 throughout session during off-the-cuff comments   PATIENT EDUCATION: Education details: see above Person educated: Patient and his wife via phone -  Education method: Explanation, pt took pictures Education comprehension: verbalized understanding and needs further education   HOME EXERCISE PROGRAM:  Target basketball plays thru repetition  and naming of hand signals.    GOALS:   Goals reviewed with patient? Yes   SHORT TERM GOALS: Target date: 10 sessions   SEE PREVIOUS NOTES FOR PROGRESS TOWARDS GOALS  Updated 01/25/2023 6.  With minimal cues, pt will demonstrate improved reading comprehension by following 2 step written directions with 75% accuracy.  Baseline:  Goal status: INITIAL: MET  7.  With minimal cues, pt will type phrase level basic functional statements (3-4 words in length) in 5 out of 7 opportunities.  Baseline:  Goal status: INITIAL:GOOD PROGRESS MADE 3 OUT OF 7 OPPORTUNITIES typing 2-3 word length  8.  With minimal cues, pt will imitate basic functional phrase level statements with 75% speech intelligibility.  Baseline:  Goal status: INITIAL: GREAT PROGRESS MADE: MET  LONG TERM GOALS: Target date: 01/26/2023   SEE PREVIOUS NOTES FOR PROGRESS TOWARDS GOALS  Updated 01/25/2023  Pt will use multimodal means of communication to express basic biographical information about himself, activities within his day and his family with minimal assistance: ongoing    2.  Pt will demonstrate > 90% speech intelligibility when reading orientation information.  Baseline:  Goal status: INITIAL Goal status: ONGOING; progress made  3. Pt will answer semi-complex  yes/no questions with > 75% accuracy given minimal cues.   Baseline: 75% with simple yes/no questions  Goal status: INITIAL: MET     ASSESSMENT:   CLINICAL IMPRESSION: Pt is a 57 year old right handed male who was seen today for a speech-language treatment d/t moderate transcortical sensory aphasia. Pt's aphasia is c/b moderate expressive deficits, mild receptive deficits, mild to  deficits in reading comprehension, spelling and written language.   Pt with improved speech intelligibility throughout the session, indicates that he is able to talk better when he practices the basketball terms at home.  See treatment note for more details.   OBJECTIVE  IMPAIRMENTS include expressive language, receptive language, apraxia, and reading comprehension and ability to produce written language . These impairments are limiting patient from return to work, managing medications, managing appointments, managing finances, household responsibilities, ADLs/IADLs, and effectively communicating at home and in community. Factors affecting potential to achieve goals and functional outcome are severity of impairments. Patient will benefit from skilled SLP services to address above impairments and improve overall function.   REHAB POTENTIAL: Excellent   PLAN: SLP FREQUENCY: 3x/week to 4xweek   SLP DURATION: 12 weeks   PLANNED INTERVENTIONS: Language facilitation, Cueing hierachy, Functional tasks, Multimodal communication approach, SLP instruction and feedback, Compensatory strategies, and Patient/family education     Dustie Brittle B. Dreama Saa, M.S., CCC-SLP, Tree surgeon Certified Brain Injury Specialist [facility] Rehabilitation Services Office [phone] Ascom [phone] Fax [phone]

## 2023-03-28 ENCOUNTER — Ambulatory Visit: Payer: BC Managed Care – PPO | Admitting: Speech Pathology

## 2023-03-29 ENCOUNTER — Ambulatory Visit: Payer: BC Managed Care – PPO | Admitting: Speech Pathology

## 2023-03-29 DIAGNOSIS — R4701 Aphasia: Secondary | ICD-10-CM

## 2023-03-29 DIAGNOSIS — R482 Apraxia: Secondary | ICD-10-CM

## 2023-03-29 DIAGNOSIS — I63512 Cerebral infarction due to unspecified occlusion or stenosis of left middle cerebral artery: Secondary | ICD-10-CM

## 2023-03-29 NOTE — Therapy (Unsigned)
OUTPATIENT SPEECH LANGUAGE PATHOLOGY  TREATMENT NOTE 10th VISIT PROGRESS NOTE   Patient Name: [name] MRN: [id number] DOB:1966-02-16, 57 y.o., male Today's Date: 03/29/2023   PCP: Myrene Buddy, NP REFERRING PROVIDER: Mariam Dollar, PA  Speech Therapy Progress Note  Dates of Reporting Period: 01/31/2023 to 03/29/2023  Objective: Patient has been seen for 10 speech therapy sessions this reporting period targeting pt's aphasia and apraxia of speech. Patient is making progress toward LTGs and met 3 out of 3 STGs this reporting period. See skilled intervention, clinical impressions, and goals below for details.    End of Session - 03/29/23 1625     Visit Number 100    Number of Visits 114    Date for SLP Re-Evaluation 04/19/23    Authorization Type [employer]    Progress Note Due on Visit 100    SLP Start Time 1550    SLP Stop Time  1610    SLP Time Calculation (min) 20 min    Activity Tolerance Patient tolerated treatment well              Past Medical History:  Diagnosis Date   Hypertension    LVH (left ventricular hypertrophy)    Myocardial infarction (HCC)    Vitamin D deficiency    Past Surgical History:  Procedure Laterality Date   ANTERIOR CRUCIATE LIGAMENT REPAIR     COLONOSCOPY WITH PROPOFOL N/A 04/11/2018   Procedure: COLONOSCOPY WITH PROPOFOL;  Surgeon: Toledo, Boykin Nearing, MD;  Location: [facility] ENDOSCOPY;  Service: Gastroenterology;  Laterality: N/A;   GASTRIC RESECTION     IR ANGIO INTRA EXTRACRAN SEL INTERNAL CAROTID UNI L MOD SED  08/15/2022   IR US GUIDE VASC ACCESS RIGHT  08/15/2022   LAPAROTOMY     LOOP RECORDER INSERTION N/A 08/18/2022   Procedure: LOOP RECORDER INSERTION;  Surgeon: Regan Lemming, MD;  Location: [facility] INVASIVE CV LAB;  Service: Cardiovascular;  Laterality: N/A;   RADIOLOGY WITH ANESTHESIA N/A 08/14/2022   Procedure: IR WITH ANESTHESIA;  Surgeon: Julieanne Cotton, MD;  Location: [facility] OR;  Service:  Radiology;  Laterality: N/A;   TEE WITHOUT CARDIOVERSION N/A 08/18/2022   Procedure: TRANSESOPHAGEAL ECHOCARDIOGRAM;  Surgeon: [name], MD;  Location: [facility] INVASIVE CV LAB;  Service: Cardiovascular;  Laterality: N/A;   Patient Active Problem List   Diagnosis Date Noted   Essential hypertension, benign 08/18/2022   Hyperlipidemia 08/18/2022   DM (diabetes mellitus), type 2 (HCC) 08/18/2022   Obesity (BMI 30-39.9) 08/18/2022   CAD (coronary artery disease) 08/18/2022   OSA (obstructive sleep apnea) 08/18/2022   Acute ischemic left MCA stroke (HCC) 08/18/2022   Acute ischemic right MCA stroke (HCC) 08/14/2022   Hypertensive urgency 04/27/2017   ONSET DATE: 08/14/2022; date of referral 08/23/2022    REFERRING DIAG: M57.846 (ICD-10-CM) - Cerebral infarction due to unspecified occlusion or stenosis of left middle cerebral artery   THERAPY DIAG:  Aphasia   Apraxia   Cerebral infarction due to unspecified occlusion or stenosis of left middle cerebral artery (HCC)   Rationale for Evaluation and Treatment Rehabilitation   SUBJECTIVE:      PERTINENT HISTORY:  [name] is 57 year old male who presented to the [facility] ED on 08/14/2022 noted by wife to be very weak and confused and halfway enter out of the bathtub at home. Symptoms were sudden in onset. Last known well 9 PM. Patient presented with global aphasia. Code stroke activated. CT head negative for hemorrhage, aspects of 9,  mild hyperdensity left MCA could indicate thrombus. Tenecteplase administered. CTA showed left M1 occlusion and neurointerventional radiology contacted. Patient transferred to [facility] for thrombectomy. On route he was noted to have periorbital welts and uvular swelling. Was concern of possible angioedema secondary to tenecteplase. He was intubated for airway protection and underwent cerebral angiogram by Dr. Tommie Sams. Recanalization of the left MCA vascular tree noted. Critical care medicine  consulted for management and placed on low-dose Neo-Synephrine infusion. Extubated on 4/23. 2D echo with EF of approximately 60 to 65% with trivial MVR. LDL 96, hemoglobin A1c 6.3%. He was started on heparin subcutaneously for VTE prophylaxis.     DIAGNOSTIC FINDINGS:    CT Head - 08/14/2022 Mild hyperdensity of the left MCA   CT Head and Neck Angio - 08/14/2022 Proximal occlusion of the left MCA M1 segment with poor collateralization throughout the MCA territory.   IR Angio - 08/15/2022 Interval recanalization of the left M1/MCA occlusion seen on prior CT angiogram likely as a result of TNK administration. Therefore, no intervention performed.   MRI 08/15/2022 Acute cortical infarcts along the left frontal operculum and left anterior insula with additional small area of acute cortical infarct within left parietal lobe. Other areas of faint cortical DWI hyperintensity throughout the left MCA territory, including the left putamen, may reflect additional areas of infarct or ischemia. 2. Loss of the left transverse sinus flow void is favored to reflect slow flow. If clinical suspicion for thrombosis, CT venography could be performed for further characterization.     PAIN:  Are you having pain? No   FALLS: Has patient fallen in last 6 months?  No   LIVING ENVIRONMENT: Lives with: lives with their family Lives in: House/apartment   PLOF:  Level of assistance: Independent with ADLs, Independent with IADLs Employment: Full-time employment     PATIENT GOALS    to be able to communicate again   SUBJECTIVE STATEMENT: "Awful day, lots fights" Pt accompanied by: self   OBJECTIVE:   TODAY'S TREATMENT:  Skilled treatment session focused on pt's communication goals. SLP facilitated session by providing the following interventions:  Functional Repetition of basketball terms and phrases - > 95% Used multimodal communicate to answer questions about work    PATIENT  EDUCATION: Education details: see above Person educated: Patient and his wife via phone -  Education method: Explanation, pt took Dealer comprehension: verbalized understanding and needs further education   HOME EXERCISE PROGRAM:  Target basketball plays thru repetition and naming of hand signals.    GOALS:   Goals reviewed with patient? Yes   SHORT TERM GOALS: Target date: 10 sessions   SEE PREVIOUS NOTES FOR PROGRESS TOWARDS GOALS  Updated 01/25/2023  Updated 03/29/2023 6.  With minimal cues, pt will demonstrate improved reading comprehension by following 2 step written directions with 75% accuracy.  Baseline:  Goal status: INITIAL: MET- updated to With supervision level cues, pt will demonstrate improved reading comprehension by following 2 step written directions with 90% accuracy.   7.  With minimal cues, pt will type phrase level basic functional statements (3-4 words in length) in 5 out of 7 opportunities.  Baseline:  Goal status: INITIAL:GOOD PROGRESS MADE 3 OUT OF 7 OPPORTUNITIES typing 2-3 word length: MET - updated to With supervision level cues, pt will type phrase level basic functional statements (3-4 words in length) in 5 out of 7 opportunities.  8.  With minimal cues, pt will imitate basic functional phrase level statements with  75% speech intelligibility.  Baseline:  Goal status: INITIAL: GREAT PROGRESS MADE: MET - updated to With supervision level cues, pt will imitate basic functional phrase level statements with 90% speech intelligibility.   LONG TERM GOALS: Target date: 01/26/2023   SEE PREVIOUS NOTES FOR PROGRESS TOWARDS GOALS  Updated 01/25/2023  Updated 03/29/2023  Pt will use multimodal means of communication to express basic biographical information about himself, activities within his day and his family with minimal assistance: ongoing: great progress made    2.  Pt will demonstrate > 90% speech intelligibility when reading orientation  information.  Baseline:  Goal status: INITIAL Goal status: ONGOING; progress made: MET  3. Pt will answer semi-complex yes/no questions with > 75% accuracy given minimal cues.   Baseline: 75% with simple yes/no questions  Goal status: INITIAL: MET     ASSESSMENT:   CLINICAL IMPRESSION: Pt is a 57 year old right handed male who was seen today for a speech-language treatment d/t moderate transcortical sensory aphasia. Pt's aphasia is c/b moderate expressive deficits, mild receptive deficits, mild to  deficits in reading comprehension, spelling and written language.   Pt continues to eagerly attend ST sessions and completes HEP. As a result, he continues to make great progress towards goals. He has successfully returned to work while attending ST sessions 2xweek. See treatment note for more details.   OBJECTIVE IMPAIRMENTS include expressive language, receptive language, apraxia, and reading comprehension and ability to produce written language . These impairments are limiting patient from return to work, managing medications, managing appointments, managing finances, household responsibilities, ADLs/IADLs, and effectively communicating at home and in community. Factors affecting potential to achieve goals and functional outcome are severity of impairments. Patient will benefit from skilled SLP services to address above impairments and improve overall function.   REHAB POTENTIAL: Excellent   PLAN: SLP FREQUENCY: 3x/week to 4xweek   SLP DURATION: 12 weeks   PLANNED INTERVENTIONS: Language facilitation, Cueing hierachy, Functional tasks, Multimodal communication approach, SLP instruction and feedback, Compensatory strategies, and Patient/family education     Asherah Lavoy B. Dreama Saa, M.S., CCC-SLP, Tree surgeon Certified Brain Injury Specialist [facility]  [facility] Services Office [phone] Ascom [phone] Fax [phone]

## 2023-03-30 ENCOUNTER — Ambulatory Visit: Payer: BC Managed Care – PPO | Admitting: Speech Pathology

## 2023-03-31 ENCOUNTER — Encounter: Payer: Self-pay | Admitting: Physical Medicine & Rehabilitation

## 2023-04-03 ENCOUNTER — Ambulatory Visit: Payer: BC Managed Care – PPO | Admitting: Speech Pathology

## 2023-04-03 DIAGNOSIS — R482 Apraxia: Secondary | ICD-10-CM

## 2023-04-03 DIAGNOSIS — R4701 Aphasia: Secondary | ICD-10-CM | POA: Diagnosis not present

## 2023-04-03 NOTE — Therapy (Signed)
OUTPATIENT SPEECH LANGUAGE PATHOLOGY  TREATMENT NOTE    Patient Name: [name] MRN: [id number] DOB:08/15/1965, 57 y.o., male Today's Date: 04/03/2023   PCP: Jon Buddy, NP REFERRING PROVIDER: Mariam Dollar, PA     End of Session - 04/03/23 1615     Visit Number 101    Number of Visits 114    Date for SLP Re-Evaluation 04/19/23    Authorization Type [employer]    Progress Note Due on Visit 110    SLP Start Time 1540    SLP Stop Time  1615    SLP Time Calculation (min) 35 min    Activity Tolerance Patient tolerated treatment well              Past Medical History:  Diagnosis Date   Hypertension    LVH (left ventricular hypertrophy)    Myocardial infarction (HCC)    Vitamin D deficiency    Past Surgical History:  Procedure Laterality Date   ANTERIOR CRUCIATE LIGAMENT REPAIR     COLONOSCOPY WITH PROPOFOL N/A 04/11/2018   Procedure: COLONOSCOPY WITH PROPOFOL;  Surgeon: Toledo, Boykin Nearing, MD;  Location: [facility] ENDOSCOPY;  Service: Gastroenterology;  Laterality: N/A;   GASTRIC RESECTION     IR ANGIO INTRA EXTRACRAN SEL INTERNAL CAROTID UNI L MOD SED  08/15/2022   IR US GUIDE VASC ACCESS RIGHT  08/15/2022   LAPAROTOMY     LOOP RECORDER INSERTION N/A 08/18/2022   Procedure: LOOP RECORDER INSERTION;  Surgeon: Regan Lemming, MD;  Location: [facility] INVASIVE CV LAB;  Service: Cardiovascular;  Laterality: N/A;   RADIOLOGY WITH ANESTHESIA N/A 08/14/2022   Procedure: IR WITH ANESTHESIA;  Surgeon: Julieanne Cotton, MD;  Location: [facility] OR;  Service: Radiology;  Laterality: N/A;   TEE WITHOUT CARDIOVERSION N/A 08/18/2022   Procedure: TRANSESOPHAGEAL ECHOCARDIOGRAM;  Surgeon: [name], MD;  Location: [facility] INVASIVE CV LAB;  Service: Cardiovascular;  Laterality: N/A;   Patient Active Problem List   Diagnosis Date Noted   Essential hypertension, benign 08/18/2022   Hyperlipidemia 08/18/2022   DM (diabetes mellitus), type 2 (HCC) 08/18/2022    Obesity (BMI 30-39.9) 08/18/2022   CAD (coronary artery disease) 08/18/2022   OSA (obstructive sleep apnea) 08/18/2022   Acute ischemic left MCA stroke (HCC) 08/18/2022   Acute ischemic right MCA stroke (HCC) 08/14/2022   Hypertensive urgency 04/27/2017   ONSET DATE: 08/14/2022; date of referral 08/23/2022    REFERRING DIAG: N02.725 (ICD-10-CM) - Cerebral infarction due to unspecified occlusion or stenosis of left middle cerebral artery   THERAPY DIAG:  Aphasia   Apraxia   Cerebral infarction due to unspecified occlusion or stenosis of left middle cerebral artery (HCC)   Rationale for Evaluation and Treatment Rehabilitation   SUBJECTIVE:      PERTINENT HISTORY:  [name] is 57 year old male who presented to the [facility] ED on 08/14/2022 noted by wife to be very weak and confused and halfway enter out of the bathtub at home. Symptoms were sudden in onset. Last known well 9 PM. Patient presented with global aphasia. Code stroke activated. CT head negative for hemorrhage, aspects of 9, mild hyperdensity left MCA could indicate thrombus. Tenecteplase administered. CTA showed left M1 occlusion and neurointerventional radiology contacted. Patient transferred to [facility] for thrombectomy. On route he was noted to have periorbital welts and uvular swelling. Was concern of possible angioedema secondary to tenecteplase. He was intubated for airway protection and underwent cerebral angiogram by Dr. Tommie Sams. Recanalization of  the left MCA vascular tree noted. Critical care medicine consulted for management and placed on low-dose Neo-Synephrine infusion. Extubated on 4/23. 2D echo with EF of approximately 60 to 65% with trivial MVR. LDL 96, hemoglobin A1c 6.3%. He was started on heparin subcutaneously for VTE prophylaxis.     DIAGNOSTIC FINDINGS:    CT Head - 08/14/2022 Mild hyperdensity of the left MCA   CT Head and Neck Angio - 08/14/2022 Proximal occlusion of the left MCA M1  segment with poor collateralization throughout the MCA territory.   IR Angio - 08/15/2022 Interval recanalization of the left M1/MCA occlusion seen on prior CT angiogram likely as a result of TNK administration. Therefore, no intervention performed.   MRI 08/15/2022 Acute cortical infarcts along the left frontal operculum and left anterior insula with additional small area of acute cortical infarct within left parietal lobe. Other areas of faint cortical DWI hyperintensity throughout the left MCA territory, including the left putamen, may reflect additional areas of infarct or ischemia. 2. Loss of the left transverse sinus flow void is favored to reflect slow flow. If clinical suspicion for thrombosis, CT venography could be performed for further characterization.     PAIN:  Are you having pain? No   FALLS: Has patient fallen in last 6 months?  No   LIVING ENVIRONMENT: Lives with: lives with their family Lives in: House/apartment   PLOF:  Level of assistance: Independent with ADLs, Independent with IADLs Employment: Full-time employment     PATIENT GOALS    to be able to communicate again   SUBJECTIVE STATEMENT: "Long day, tired" Pt accompanied by: self   OBJECTIVE:   TODAY'S TREATMENT:  Skilled treatment session focused on pt's communication goals. SLP facilitated session by providing the following interventions:  Functional Repetition of novel basketball terms and slang - 90% with specific difficulty repeating consonant clusters Used multimodal communicate to answer questions about work    PATIENT EDUCATION: Education details: see above Person educated: Patient and his wife via phone -  Education method: Explanation, pt took Dealer comprehension: verbalized understanding and needs further education   HOME EXERCISE PROGRAM:  Target basketball plays thru repetition and naming of hand signals.    GOALS:   Goals reviewed with patient? Yes   SHORT  TERM GOALS: Target date: 10 sessions   SEE PREVIOUS NOTES FOR PROGRESS TOWARDS GOALS  Updated 01/25/2023  Updated 03/29/2023 6.  With minimal cues, pt will demonstrate improved reading comprehension by following 2 step written directions with 75% accuracy.  Baseline:  Goal status: INITIAL: MET- updated to With supervision level cues, pt will demonstrate improved reading comprehension by following 2 step written directions with 90% accuracy.   7.  With minimal cues, pt will type phrase level basic functional statements (3-4 words in length) in 5 out of 7 opportunities.  Baseline:  Goal status: INITIAL:GOOD PROGRESS MADE 3 OUT OF 7 OPPORTUNITIES typing 2-3 word length: MET - updated to With supervision level cues, pt will type phrase level basic functional statements (3-4 words in length) in 5 out of 7 opportunities.  8.  With minimal cues, pt will imitate basic functional phrase level statements with 75% speech intelligibility.  Baseline:  Goal status: INITIAL: GREAT PROGRESS MADE: MET - updated to With supervision level cues, pt will imitate basic functional phrase level statements with 90% speech intelligibility.   LONG TERM GOALS: Target date: 01/26/2023   SEE PREVIOUS NOTES FOR PROGRESS TOWARDS GOALS  Updated 01/25/2023  Updated 03/29/2023  Pt  will use multimodal means of communication to express basic biographical information about himself, activities within his day and his family with minimal assistance: ongoing: great progress made    2.  Pt will demonstrate > 90% speech intelligibility when reading orientation information.  Baseline:  Goal status: INITIAL Goal status: ONGOING; progress made: MET  3. Pt will answer semi-complex yes/no questions with > 75% accuracy given minimal cues.   Baseline: 75% with simple yes/no questions  Goal status: INITIAL: MET     ASSESSMENT:   CLINICAL IMPRESSION: Pt is a 57 year old right handed male who was seen today for a speech-language  treatment d/t moderate transcortical sensory aphasia. Pt's aphasia is c/b moderate expressive deficits, mild receptive deficits, mild to  deficits in reading comprehension, spelling and written language.   Pt reports increased speech and then periods of inability c/b motor difficulty. He used eral and gestures to describe concept. See treatment note for more details.   OBJECTIVE IMPAIRMENTS include expressive language, receptive language, apraxia, and reading comprehension and ability to produce written language . These impairments are limiting patient from return to work, managing medications, managing appointments, managing finances, household responsibilities, ADLs/IADLs, and effectively communicating at home and in community. Factors affecting potential to achieve goals and functional outcome are severity of impairments. Patient will benefit from skilled SLP services to address above impairments and improve overall function.   REHAB POTENTIAL: Excellent   PLAN: SLP FREQUENCY: 3x/week to 4xweek   SLP DURATION: 12 weeks   PLANNED INTERVENTIONS: Language facilitation, Cueing hierachy, Functional tasks, Multimodal communication approach, SLP instruction and feedback, Compensatory strategies, and Patient/family education     Mayson Sterbenz B. Dreama Saa, M.S., CCC-SLP, Tree surgeon Certified Brain Injury Specialist [facility]  [facility] Rehabilitation Services Office [phone] Ascom [phone] Fax [phone]

## 2023-04-04 ENCOUNTER — Ambulatory Visit: Payer: BC Managed Care – PPO | Admitting: Speech Pathology

## 2023-04-05 ENCOUNTER — Ambulatory Visit: Payer: BC Managed Care – PPO | Admitting: Speech Pathology

## 2023-04-05 DIAGNOSIS — R4701 Aphasia: Secondary | ICD-10-CM

## 2023-04-05 DIAGNOSIS — R482 Apraxia: Secondary | ICD-10-CM

## 2023-04-05 DIAGNOSIS — I63512 Cerebral infarction due to unspecified occlusion or stenosis of left middle cerebral artery: Secondary | ICD-10-CM

## 2023-04-05 NOTE — Therapy (Unsigned)
OUTPATIENT SPEECH LANGUAGE PATHOLOGY  TREATMENT NOTE    Patient Name: [name] MRN: [id number] DOB:October 12, 1965, 57 y.o., male Today's Date: 04/05/2023   PCP: Myrene Buddy, NP REFERRING PROVIDER: Mariam Dollar, PA     End of Session - 04/05/23 1620     Visit Number 102    Number of Visits 114    Date for SLP Re-Evaluation 04/19/23    Authorization Type [employer]    Progress Note Due on Visit 120    SLP Start Time 1540    SLP Stop Time  1615    SLP Time Calculation (min) 35 min    Activity Tolerance Patient tolerated treatment well              Past Medical History:  Diagnosis Date   Hypertension    LVH (left ventricular hypertrophy)    Myocardial infarction (HCC)    Vitamin D deficiency    Past Surgical History:  Procedure Laterality Date   ANTERIOR CRUCIATE LIGAMENT REPAIR     COLONOSCOPY WITH PROPOFOL N/A 04/11/2018   Procedure: COLONOSCOPY WITH PROPOFOL;  Surgeon: Toledo, Boykin Nearing, MD;  Location: [facility] ENDOSCOPY;  Service: Gastroenterology;  Laterality: N/A;   GASTRIC RESECTION     IR ANGIO INTRA EXTRACRAN SEL INTERNAL CAROTID UNI L MOD SED  08/15/2022   IR US GUIDE VASC ACCESS RIGHT  08/15/2022   LAPAROTOMY     LOOP RECORDER INSERTION N/A 08/18/2022   Procedure: LOOP RECORDER INSERTION;  Surgeon: Regan Lemming, MD;  Location: [facility] INVASIVE CV LAB;  Service: Cardiovascular;  Laterality: N/A;   RADIOLOGY WITH ANESTHESIA N/A 08/14/2022   Procedure: IR WITH ANESTHESIA;  Surgeon: Julieanne Cotton, MD;  Location: [facility] OR;  Service: Radiology;  Laterality: N/A;   TEE WITHOUT CARDIOVERSION N/A 08/18/2022   Procedure: TRANSESOPHAGEAL ECHOCARDIOGRAM;  Surgeon: [name], MD;  Location: [facility] INVASIVE CV LAB;  Service: Cardiovascular;  Laterality: N/A;   Patient Active Problem List   Diagnosis Date Noted   Essential hypertension, benign 08/18/2022   Hyperlipidemia 08/18/2022   DM (diabetes mellitus), type 2 (HCC) 08/18/2022    Obesity (BMI 30-39.9) 08/18/2022   CAD (coronary artery disease) 08/18/2022   OSA (obstructive sleep apnea) 08/18/2022   Acute ischemic left MCA stroke (HCC) 08/18/2022   Acute ischemic right MCA stroke (HCC) 08/14/2022   Hypertensive urgency 04/27/2017   ONSET DATE: 08/14/2022; date of referral 08/23/2022    REFERRING DIAG: W09.811 (ICD-10-CM) - Cerebral infarction due to unspecified occlusion or stenosis of left middle cerebral artery   THERAPY DIAG:  Aphasia   Apraxia   Cerebral infarction due to unspecified occlusion or stenosis of left middle cerebral artery (HCC)   Rationale for Evaluation and Treatment Rehabilitation   SUBJECTIVE:      PERTINENT HISTORY:  [name] is 58 year old male who presented to the [facility] ED on 08/14/2022 noted by wife to be very weak and confused and halfway enter out of the bathtub at home. Symptoms were sudden in onset. Last known well 9 PM. Patient presented with global aphasia. Code stroke activated. CT head negative for hemorrhage, aspects of 9, mild hyperdensity left MCA could indicate thrombus. Tenecteplase administered. CTA showed left M1 occlusion and neurointerventional radiology contacted. Patient transferred to [facility] for thrombectomy. On route he was noted to have periorbital welts and uvular swelling. Was concern of possible angioedema secondary to tenecteplase. He was intubated for airway protection and underwent cerebral angiogram by Dr. Tommie Sams. Recanalization of  the left MCA vascular tree noted. Critical care medicine consulted for management and placed on low-dose Neo-Synephrine infusion. Extubated on 4/23. 2D echo with EF of approximately 60 to 65% with trivial MVR. LDL 96, hemoglobin A1c 6.3%. He was started on heparin subcutaneously for VTE prophylaxis.     DIAGNOSTIC FINDINGS:    CT Head - 08/14/2022 Mild hyperdensity of the left MCA   CT Head and Neck Angio - 08/14/2022 Proximal occlusion of the left MCA M1  segment with poor collateralization throughout the MCA territory.   IR Angio - 08/15/2022 Interval recanalization of the left M1/MCA occlusion seen on prior CT angiogram likely as a result of TNK administration. Therefore, no intervention performed.   MRI 08/15/2022 Acute cortical infarcts along the left frontal operculum and left anterior insula with additional small area of acute cortical infarct within left parietal lobe. Other areas of faint cortical DWI hyperintensity throughout the left MCA territory, including the left putamen, may reflect additional areas of infarct or ischemia. 2. Loss of the left transverse sinus flow void is favored to reflect slow flow. If clinical suspicion for thrombosis, CT venography could be performed for further characterization.     PAIN:  Are you having pain? No   FALLS: Has patient fallen in last 6 months?  No   LIVING ENVIRONMENT: Lives with: lives with their family Lives in: House/apartment   PLOF:  Level of assistance: Independent with ADLs, Independent with IADLs Employment: Full-time employment     PATIENT GOALS    to be able to communicate again   SUBJECTIVE STATEMENT: "Long day, tired" Pt accompanied by: self   OBJECTIVE:   TODAY'S TREATMENT:  Skilled treatment session focused on pt's communication goals. SLP facilitated session by providing the following interventions:  Functional Repetition of novel basketball terms and slang - 90% with specific difficulty repeating consonant clusters Used multimodal communicate to answer questions about work    PATIENT EDUCATION: Education details: see above Person educated: Patient and his wife via phone -  Education method: Explanation, pt took Dealer comprehension: verbalized understanding and needs further education   HOME EXERCISE PROGRAM:  Target basketball plays thru repetition and naming of hand signals.    GOALS:   Goals reviewed with patient? Yes   SHORT  TERM GOALS: Target date: 10 sessions   SEE PREVIOUS NOTES FOR PROGRESS TOWARDS GOALS  Updated 01/25/2023  Updated 03/29/2023 6.  With minimal cues, pt will demonstrate improved reading comprehension by following 2 step written directions with 75% accuracy.  Baseline:  Goal status: INITIAL: MET- updated to With supervision level cues, pt will demonstrate improved reading comprehension by following 2 step written directions with 90% accuracy.   7.  With minimal cues, pt will type phrase level basic functional statements (3-4 words in length) in 5 out of 7 opportunities.  Baseline:  Goal status: INITIAL:GOOD PROGRESS MADE 3 OUT OF 7 OPPORTUNITIES typing 2-3 word length: MET - updated to With supervision level cues, pt will type phrase level basic functional statements (3-4 words in length) in 5 out of 7 opportunities.  8.  With minimal cues, pt will imitate basic functional phrase level statements with 75% speech intelligibility.  Baseline:  Goal status: INITIAL: GREAT PROGRESS MADE: MET - updated to With supervision level cues, pt will imitate basic functional phrase level statements with 90% speech intelligibility.   LONG TERM GOALS: Target date: 01/26/2023   SEE PREVIOUS NOTES FOR PROGRESS TOWARDS GOALS  Updated 01/25/2023  Updated 03/29/2023  Pt  will use multimodal means of communication to express basic biographical information about himself, activities within his day and his family with minimal assistance: ongoing: great progress made    2.  Pt will demonstrate > 90% speech intelligibility when reading orientation information.  Baseline:  Goal status: INITIAL Goal status: ONGOING; progress made: MET  3. Pt will answer semi-complex yes/no questions with > 75% accuracy given minimal cues.   Baseline: 75% with simple yes/no questions  Goal status: INITIAL: MET     ASSESSMENT:   CLINICAL IMPRESSION: Pt is a 57 year old right handed male who was seen today for a speech-language  treatment d/t moderate transcortical sensory aphasia. Pt's aphasia is c/b moderate expressive deficits, mild receptive deficits, mild to  deficits in reading comprehension, spelling and written language.   Pt reports increased speech and then periods of inability c/b motor difficulty. He used eral and gestures to describe concept. See treatment note for more details.   OBJECTIVE IMPAIRMENTS include expressive language, receptive language, apraxia, and reading comprehension and ability to produce written language . These impairments are limiting patient from return to work, managing medications, managing appointments, managing finances, household responsibilities, ADLs/IADLs, and effectively communicating at home and in community. Factors affecting potential to achieve goals and functional outcome are severity of impairments. Patient will benefit from skilled SLP services to address above impairments and improve overall function.   REHAB POTENTIAL: Excellent   PLAN: SLP FREQUENCY: 3x/week to 4xweek   SLP DURATION: 12 weeks   PLANNED INTERVENTIONS: Language facilitation, Cueing hierachy, Functional tasks, Multimodal communication approach, SLP instruction and feedback, Compensatory strategies, and Patient/family education     Kris No B. Dreama Saa, M.S., CCC-SLP, Tree surgeon Certified Brain Injury Specialist [facility]  [facility] Office [phone] Ascom [phone] Fax [phone]

## 2023-04-06 ENCOUNTER — Ambulatory Visit: Payer: BC Managed Care – PPO | Admitting: Speech Pathology

## 2023-04-10 ENCOUNTER — Ambulatory Visit: Payer: BC Managed Care – PPO | Admitting: Speech Pathology

## 2023-04-10 DIAGNOSIS — R4701 Aphasia: Secondary | ICD-10-CM

## 2023-04-10 DIAGNOSIS — R482 Apraxia: Secondary | ICD-10-CM

## 2023-04-10 DIAGNOSIS — I63512 Cerebral infarction due to unspecified occlusion or stenosis of left middle cerebral artery: Secondary | ICD-10-CM

## 2023-04-10 NOTE — Therapy (Signed)
OUTPATIENT SPEECH LANGUAGE PATHOLOGY  TREATMENT NOTE    Patient Name: [name] MRN: [id number] DOB:16-Dec-1965, 57 y.o., male Today's Date: 04/10/2023   PCP: Myrene Buddy, NP REFERRING PROVIDER: Mariam Dollar, PA     End of Session - 04/10/23 1603     Visit Number 103    Number of Visits 114    Date for SLP Re-Evaluation 04/19/23    Authorization Type [employer]    Progress Note Due on Visit 120    SLP Start Time 1540    SLP Stop Time  1615    SLP Time Calculation (min) 35 min    Activity Tolerance Patient tolerated treatment well              Past Medical History:  Diagnosis Date   Hypertension    LVH (left ventricular hypertrophy)    Myocardial infarction (HCC)    Vitamin D deficiency    Past Surgical History:  Procedure Laterality Date   ANTERIOR CRUCIATE LIGAMENT REPAIR     COLONOSCOPY WITH PROPOFOL N/A 04/11/2018   Procedure: COLONOSCOPY WITH PROPOFOL;  Surgeon: Toledo, Boykin Nearing, MD;  Location: [facility] ENDOSCOPY;  Service: Gastroenterology;  Laterality: N/A;   GASTRIC RESECTION     IR ANGIO INTRA EXTRACRAN SEL INTERNAL CAROTID UNI L MOD SED  08/15/2022   IR US GUIDE VASC ACCESS RIGHT  08/15/2022   LAPAROTOMY     LOOP RECORDER INSERTION N/A 08/18/2022   Procedure: LOOP RECORDER INSERTION;  Surgeon: Regan Lemming, MD;  Location: [facility] INVASIVE CV LAB;  Service: Cardiovascular;  Laterality: N/A;   RADIOLOGY WITH ANESTHESIA N/A 08/14/2022   Procedure: IR WITH ANESTHESIA;  Surgeon: Julieanne Cotton, MD;  Location: [facility] OR;  Service: Radiology;  Laterality: N/A;   TEE WITHOUT CARDIOVERSION N/A 08/18/2022   Procedure: TRANSESOPHAGEAL ECHOCARDIOGRAM;  Surgeon: [name], MD;  Location: [facility] INVASIVE CV LAB;  Service: Cardiovascular;  Laterality: N/A;   Patient Active Problem List   Diagnosis Date Noted   Essential hypertension, benign 08/18/2022   Hyperlipidemia 08/18/2022   DM (diabetes mellitus), type 2 (HCC) 08/18/2022    Obesity (BMI 30-39.9) 08/18/2022   CAD (coronary artery disease) 08/18/2022   OSA (obstructive sleep apnea) 08/18/2022   Acute ischemic left MCA stroke (HCC) 08/18/2022   Acute ischemic right MCA stroke (HCC) 08/14/2022   Hypertensive urgency 04/27/2017   ONSET DATE: 08/14/2022; date of referral 08/23/2022    REFERRING DIAG: W09.811 (ICD-10-CM) - Cerebral infarction due to unspecified occlusion or stenosis of left middle cerebral artery   THERAPY DIAG:  Aphasia   Apraxia   Cerebral infarction due to unspecified occlusion or stenosis of left middle cerebral artery (HCC)   Rationale for Evaluation and Treatment Rehabilitation   SUBJECTIVE:      PERTINENT HISTORY:  [name] is 57 year old male who presented to the [facility] ED on 08/14/2022 noted by wife to be very weak and confused and halfway enter out of the bathtub at home. Symptoms were sudden in onset. Last known well 9 PM. Patient presented with global aphasia. Code stroke activated. CT head negative for hemorrhage, aspects of 9, mild hyperdensity left MCA could indicate thrombus. Tenecteplase administered. CTA showed left M1 occlusion and neurointerventional radiology contacted. Patient transferred to [facility] for thrombectomy. On route he was noted to have periorbital welts and uvular swelling. Was concern of possible angioedema secondary to tenecteplase. He was intubated for airway protection and underwent cerebral angiogram by Dr. Tommie Sams. Recanalization of  the left MCA vascular tree noted. Critical care medicine consulted for management and placed on low-dose Neo-Synephrine infusion. Extubated on 4/23. 2D echo with EF of approximately 60 to 65% with trivial MVR. LDL 96, hemoglobin A1c 6.3%. He was started on heparin subcutaneously for VTE prophylaxis.     DIAGNOSTIC FINDINGS:    CT Head - 08/14/2022 Mild hyperdensity of the left MCA   CT Head and Neck Angio - 08/14/2022 Proximal occlusion of the left MCA M1  segment with poor collateralization throughout the MCA territory.   IR Angio - 08/15/2022 Interval recanalization of the left M1/MCA occlusion seen on prior CT angiogram likely as a result of TNK administration. Therefore, no intervention performed.   MRI 08/15/2022 Acute cortical infarcts along the left frontal operculum and left anterior insula with additional small area of acute cortical infarct within left parietal lobe. Other areas of faint cortical DWI hyperintensity throughout the left MCA territory, including the left putamen, may reflect additional areas of infarct or ischemia. 2. Loss of the left transverse sinus flow void is favored to reflect slow flow. If clinical suspicion for thrombosis, CT venography could be performed for further characterization.     PAIN:  Are you having pain? No   FALLS: Has patient fallen in last 6 months?  No   LIVING ENVIRONMENT: Lives with: lives with their family Lives in: House/apartment   PLOF:  Level of assistance: Independent with ADLs, Independent with IADLs Employment: Full-time employment     PATIENT GOALS    to be able to communicate again   SUBJECTIVE STATEMENT: "Long day, tired" Pt accompanied by: self   OBJECTIVE:   TODAY'S TREATMENT:  Skilled treatment session focused on pt's communication goals. SLP facilitated session by providing the following interventions:  Functional Repetition of 200 basketball terms and slang - 90% with specific difficulty repeating consonant clusters Used multimodal communicate to answer questions about work    PATIENT EDUCATION: Education details: see above Person educated: Patient and his wife via phone -  Education method: Explanation, pt took Dealer comprehension: verbalized understanding and needs further education   HOME EXERCISE PROGRAM:  Target basketball plays thru repetition and naming of hand signals.    GOALS:   Goals reviewed with patient? Yes   SHORT  TERM GOALS: Target date: 10 sessions   SEE PREVIOUS NOTES FOR PROGRESS TOWARDS GOALS  Updated 01/25/2023  Updated 03/29/2023 6.  With minimal cues, pt will demonstrate improved reading comprehension by following 2 step written directions with 75% accuracy.  Baseline:  Goal status: INITIAL: MET- updated to With supervision level cues, pt will demonstrate improved reading comprehension by following 2 step written directions with 90% accuracy.   7.  With minimal cues, pt will type phrase level basic functional statements (3-4 words in length) in 5 out of 7 opportunities.  Baseline:  Goal status: INITIAL:GOOD PROGRESS MADE 3 OUT OF 7 OPPORTUNITIES typing 2-3 word length: MET - updated to With supervision level cues, pt will type phrase level basic functional statements (3-4 words in length) in 5 out of 7 opportunities.  8.  With minimal cues, pt will imitate basic functional phrase level statements with 75% speech intelligibility.  Baseline:  Goal status: INITIAL: GREAT PROGRESS MADE: MET - updated to With supervision level cues, pt will imitate basic functional phrase level statements with 90% speech intelligibility.   LONG TERM GOALS: Target date: 01/26/2023   SEE PREVIOUS NOTES FOR PROGRESS TOWARDS GOALS  Updated 01/25/2023  Updated 03/29/2023  Pt  will use multimodal means of communication to express basic biographical information about himself, activities within his day and his family with minimal assistance: ongoing: great progress made    2.  Pt will demonstrate > 90% speech intelligibility when reading orientation information.  Baseline:  Goal status: INITIAL Goal status: ONGOING; progress made: MET  3. Pt will answer semi-complex yes/no questions with > 75% accuracy given minimal cues.   Baseline: 75% with simple yes/no questions  Goal status: INITIAL: MET     ASSESSMENT:   CLINICAL IMPRESSION: Pt is a 57 year old right handed male who was seen today for a speech-language  treatment d/t moderate transcortical sensory aphasia. Pt's aphasia is c/b moderate expressive deficits, mild receptive deficits, mild to  deficits in reading comprehension, spelling and written language.   Pt reports increased speech and then periods of inability c/b motor difficulty. He used eral and gestures to describe concept. See treatment note for more details.   OBJECTIVE IMPAIRMENTS include expressive language, receptive language, apraxia, and reading comprehension and ability to produce written language . These impairments are limiting patient from return to work, managing medications, managing appointments, managing finances, household responsibilities, ADLs/IADLs, and effectively communicating at home and in community. Factors affecting potential to achieve goals and functional outcome are severity of impairments. Patient will benefit from skilled SLP services to address above impairments and improve overall function.   REHAB POTENTIAL: Excellent   PLAN: SLP FREQUENCY: 3x/week to 4xweek   SLP DURATION: 12 weeks   PLANNED INTERVENTIONS: Language facilitation, Cueing hierachy, Functional tasks, Multimodal communication approach, SLP instruction and feedback, Compensatory strategies, and Patient/family education     Jaynia Fendley B. Dreama Saa, M.S., CCC-SLP, Tree surgeon Certified Brain Injury Specialist [facility]  [facility] Rehabilitation Services Office [phone] Ascom [phone] Fax [phone]

## 2023-04-11 ENCOUNTER — Ambulatory Visit: Payer: BC Managed Care – PPO | Admitting: Speech Pathology

## 2023-04-12 ENCOUNTER — Ambulatory Visit: Payer: BC Managed Care – PPO | Admitting: Speech Pathology

## 2023-04-12 DIAGNOSIS — R4701 Aphasia: Secondary | ICD-10-CM

## 2023-04-12 DIAGNOSIS — I63512 Cerebral infarction due to unspecified occlusion or stenosis of left middle cerebral artery: Secondary | ICD-10-CM

## 2023-04-12 DIAGNOSIS — R482 Apraxia: Secondary | ICD-10-CM

## 2023-04-12 NOTE — Therapy (Signed)
OUTPATIENT SPEECH LANGUAGE PATHOLOGY  TREATMENT NOTE    Patient Name: [name] MRN: [id number] DOB:Dec 23, 1965, 57 y.o., male Today's Date: 04/12/2023   PCP: Myrene Buddy, NP REFERRING PROVIDER: Mariam Dollar, PA     End of Session - 04/12/23 1545     Visit Number 104    Number of Visits 114    Date for SLP Re-Evaluation 04/19/23    Authorization Type [employer]    Progress Note Due on Visit 120    SLP Start Time 1540    SLP Stop Time  1615    SLP Time Calculation (min) 35 min    Activity Tolerance Patient tolerated treatment well              Past Medical History:  Diagnosis Date   Hypertension    LVH (left ventricular hypertrophy)    Myocardial infarction (HCC)    Vitamin D deficiency    Past Surgical History:  Procedure Laterality Date   ANTERIOR CRUCIATE LIGAMENT REPAIR     COLONOSCOPY WITH PROPOFOL N/A 04/11/2018   Procedure: COLONOSCOPY WITH PROPOFOL;  Surgeon: Toledo, Boykin Nearing, MD;  Location: [facility] ENDOSCOPY;  Service: Gastroenterology;  Laterality: N/A;   GASTRIC RESECTION     IR ANGIO INTRA EXTRACRAN SEL INTERNAL CAROTID UNI L MOD SED  08/15/2022   IR US GUIDE VASC ACCESS RIGHT  08/15/2022   LAPAROTOMY     LOOP RECORDER INSERTION N/A 08/18/2022   Procedure: LOOP RECORDER INSERTION;  Surgeon: Regan Lemming, MD;  Location: [facility] INVASIVE CV LAB;  Service: Cardiovascular;  Laterality: N/A;   RADIOLOGY WITH ANESTHESIA N/A 08/14/2022   Procedure: IR WITH ANESTHESIA;  Surgeon: Julieanne Cotton, MD;  Location: [facility] OR;  Service: Radiology;  Laterality: N/A;   TEE WITHOUT CARDIOVERSION N/A 08/18/2022   Procedure: TRANSESOPHAGEAL ECHOCARDIOGRAM;  Surgeon: [name], MD;  Location: [facility] INVASIVE CV LAB;  Service: Cardiovascular;  Laterality: N/A;   Patient Active Problem List   Diagnosis Date Noted   Essential hypertension, benign 08/18/2022   Hyperlipidemia 08/18/2022   DM (diabetes mellitus), type 2 (HCC) 08/18/2022    Obesity (BMI 30-39.9) 08/18/2022   CAD (coronary artery disease) 08/18/2022   OSA (obstructive sleep apnea) 08/18/2022   Acute ischemic left MCA stroke (HCC) 08/18/2022   Acute ischemic right MCA stroke (HCC) 08/14/2022   Hypertensive urgency 04/27/2017   ONSET DATE: 08/14/2022; date of referral 08/23/2022    REFERRING DIAG: M57.846 (ICD-10-CM) - Cerebral infarction due to unspecified occlusion or stenosis of left middle cerebral artery   THERAPY DIAG:  Aphasia   Apraxia   Cerebral infarction due to unspecified occlusion or stenosis of left middle cerebral artery (HCC)   Rationale for Evaluation and Treatment Rehabilitation   SUBJECTIVE:      PERTINENT HISTORY:  [name] is 57 year old male who presented to the [facility] ED on 08/14/2022 noted by wife to be very weak and confused and halfway enter out of the bathtub at home. Symptoms were sudden in onset. Last known well 9 PM. Patient presented with global aphasia. Code stroke activated. CT head negative for hemorrhage, aspects of 9, mild hyperdensity left MCA could indicate thrombus. Tenecteplase administered. CTA showed left M1 occlusion and neurointerventional radiology contacted. Patient transferred to [facility] for thrombectomy. On route he was noted to have periorbital welts and uvular swelling. Was concern of possible angioedema secondary to tenecteplase. He was intubated for airway protection and underwent cerebral angiogram by Dr. Tommie Sams. Recanalization of  the left MCA vascular tree noted. Critical care medicine consulted for management and placed on low-dose Neo-Synephrine infusion. Extubated on 4/23. 2D echo with EF of approximately 60 to 65% with trivial MVR. LDL 96, hemoglobin A1c 6.3%. He was started on heparin subcutaneously for VTE prophylaxis.     DIAGNOSTIC FINDINGS:    CT Head - 08/14/2022 Mild hyperdensity of the left MCA   CT Head and Neck Angio - 08/14/2022 Proximal occlusion of the left MCA M1  segment with poor collateralization throughout the MCA territory.   IR Angio - 08/15/2022 Interval recanalization of the left M1/MCA occlusion seen on prior CT angiogram likely as a result of TNK administration. Therefore, no intervention performed.   MRI 08/15/2022 Acute cortical infarcts along the left frontal operculum and left anterior insula with additional small area of acute cortical infarct within left parietal lobe. Other areas of faint cortical DWI hyperintensity throughout the left MCA territory, including the left putamen, may reflect additional areas of infarct or ischemia. 2. Loss of the left transverse sinus flow void is favored to reflect slow flow. If clinical suspicion for thrombosis, CT venography could be performed for further characterization.     PAIN:  Are you having pain? No   FALLS: Has patient fallen in last 6 months?  No   LIVING ENVIRONMENT: Lives with: lives with their family Lives in: House/apartment   PLOF:  Level of assistance: Independent with ADLs, Independent with IADLs Employment: Full-time employment     PATIENT GOALS    to be able to communicate again   SUBJECTIVE STATEMENT: "Too many too long" Pt accompanied by: self   OBJECTIVE:   TODAY'S TREATMENT:  Skilled treatment session focused on pt's communication goals. SLP facilitated session by providing the following interventions:  Functional Repetition of 225 basketball terms and slang - 90% with specific difficulty repeating consonant clusters Used multimodal communicate to answer questions about work    PATIENT EDUCATION: Education details: see above Person educated: Patient and his wife via phone -  Education method: Explanation, pt took Dealer comprehension: verbalized understanding and needs further education   HOME EXERCISE PROGRAM:  Target basketball plays thru repetition and naming of hand signals.    GOALS:   Goals reviewed with patient? Yes   SHORT  TERM GOALS: Target date: 10 sessions   SEE PREVIOUS NOTES FOR PROGRESS TOWARDS GOALS  Updated 01/25/2023  Updated 03/29/2023 6.  With minimal cues, pt will demonstrate improved reading comprehension by following 2 step written directions with 75% accuracy.  Baseline:  Goal status: INITIAL: MET- updated to With supervision level cues, pt will demonstrate improved reading comprehension by following 2 step written directions with 90% accuracy.   7.  With minimal cues, pt will type phrase level basic functional statements (3-4 words in length) in 5 out of 7 opportunities.  Baseline:  Goal status: INITIAL:GOOD PROGRESS MADE 3 OUT OF 7 OPPORTUNITIES typing 2-3 word length: MET - updated to With supervision level cues, pt will type phrase level basic functional statements (3-4 words in length) in 5 out of 7 opportunities.  8.  With minimal cues, pt will imitate basic functional phrase level statements with 75% speech intelligibility.  Baseline:  Goal status: INITIAL: GREAT PROGRESS MADE: MET - updated to With supervision level cues, pt will imitate basic functional phrase level statements with 90% speech intelligibility.   LONG TERM GOALS: Target date: 01/26/2023   SEE PREVIOUS NOTES FOR PROGRESS TOWARDS GOALS  Updated 01/25/2023  Updated 03/29/2023  Pt will use multimodal means of communication to express basic biographical information about himself, activities within his day and his family with minimal assistance: ongoing: great progress made    2.  Pt will demonstrate > 90% speech intelligibility when reading orientation information.  Baseline:  Goal status: INITIAL Goal status: ONGOING; progress made: MET  3. Pt will answer semi-complex yes/no questions with > 75% accuracy given minimal cues.   Baseline: 75% with simple yes/no questions  Goal status: INITIAL: MET     ASSESSMENT:   CLINICAL IMPRESSION: Pt is a 56 year old right handed male who was seen today for a speech-language  treatment d/t moderate transcortical sensory aphasia. Pt's aphasia is c/b moderate expressive deficits, mild receptive deficits, mild to  deficits in reading comprehension, spelling and written language.   Pt reports increased speech and then periods of inability c/b motor difficulty. He used eral and gestures to describe concept. See treatment note for more details.   OBJECTIVE IMPAIRMENTS include expressive language, receptive language, apraxia, and reading comprehension and ability to produce written language . These impairments are limiting patient from return to work, managing medications, managing appointments, managing finances, household responsibilities, ADLs/IADLs, and effectively communicating at home and in community. Factors affecting potential to achieve goals and functional outcome are severity of impairments. Patient will benefit from skilled SLP services to address above impairments and improve overall function.   REHAB POTENTIAL: Excellent   PLAN: SLP FREQUENCY: 3x/week to 4xweek   SLP DURATION: 12 weeks   PLANNED INTERVENTIONS: Language facilitation, Cueing hierachy, Functional tasks, Multimodal communication approach, SLP instruction and feedback, Compensatory strategies, and Patient/family education     Roselind Klus B. Dreama Saa, M.S., CCC-SLP, Tree surgeon Certified Brain Injury Specialist [facility]  [facility] Office [phone] Ascom [phone] Fax [phone]

## 2023-04-13 ENCOUNTER — Ambulatory Visit: Payer: BC Managed Care – PPO | Admitting: Speech Pathology

## 2023-04-13 NOTE — Progress Notes (Signed)
Carelink Summary Report / Loop Recorder 

## 2023-04-17 ENCOUNTER — Ambulatory Visit: Payer: BC Managed Care – PPO | Admitting: Speech Pathology

## 2023-04-18 ENCOUNTER — Ambulatory Visit: Payer: BC Managed Care – PPO | Admitting: Speech Pathology

## 2023-04-20 ENCOUNTER — Ambulatory Visit: Payer: BC Managed Care – PPO | Admitting: Speech Pathology

## 2023-04-24 ENCOUNTER — Ambulatory Visit (INDEPENDENT_AMBULATORY_CARE_PROVIDER_SITE_OTHER): Payer: BC Managed Care – PPO

## 2023-04-24 ENCOUNTER — Ambulatory Visit: Payer: BC Managed Care – PPO | Admitting: Speech Pathology

## 2023-04-24 DIAGNOSIS — I639 Cerebral infarction, unspecified: Secondary | ICD-10-CM | POA: Diagnosis not present

## 2023-04-24 LAB — CUP PACEART REMOTE DEVICE CHECK
Date Time Interrogation Session: 20241229231849
Implantable Pulse Generator Implant Date: 20240425

## 2023-04-25 ENCOUNTER — Ambulatory Visit: Payer: BC Managed Care – PPO | Admitting: Adult Health

## 2023-04-25 ENCOUNTER — Ambulatory Visit: Payer: BC Managed Care – PPO | Admitting: Speech Pathology

## 2023-04-27 ENCOUNTER — Ambulatory Visit: Payer: 59 | Admitting: Speech Pathology

## 2023-05-01 ENCOUNTER — Ambulatory Visit: Payer: 59 | Attending: Physician Assistant | Admitting: Speech Pathology

## 2023-05-01 DIAGNOSIS — R482 Apraxia: Secondary | ICD-10-CM

## 2023-05-01 DIAGNOSIS — R4701 Aphasia: Secondary | ICD-10-CM

## 2023-05-01 DIAGNOSIS — I63512 Cerebral infarction due to unspecified occlusion or stenosis of left middle cerebral artery: Secondary | ICD-10-CM | POA: Diagnosis present

## 2023-05-01 NOTE — Therapy (Signed)
 OUTPATIENT SPEECH LANGUAGE PATHOLOGY  TREATMENT NOTE RE-CERTIFICATION REQUEST    Patient Name: [name] MRN: [id number] DOB:11/28/1965, 58 y.o., male Today's Date: 05/01/2023   PCP: Lauraine Lamarr Leak, NP REFERRING PROVIDER: Toribio Pitch, PA     End of Session - 05/01/23 1447     Visit Number 105    Number of Visits 129    Date for SLP Re-Evaluation 07/24/23    Authorization Type [employer]    Progress Note Due on Visit 110    SLP Start Time 1445    SLP Stop Time  1530    SLP Time Calculation (min) 45 min    Activity Tolerance Patient tolerated treatment well              Past Medical History:  Diagnosis Date   Hypertension    LVH (left ventricular hypertrophy)    Myocardial infarction (HCC)    Vitamin D deficiency    Past Surgical History:  Procedure Laterality Date   ANTERIOR CRUCIATE LIGAMENT REPAIR     COLONOSCOPY WITH PROPOFOL  N/A 04/11/2018   Procedure: COLONOSCOPY WITH PROPOFOL ;  Surgeon: Toledo, Ladell POUR, MD;  Location: [facility] ENDOSCOPY;  Service: Gastroenterology;  Laterality: N/A;   GASTRIC RESECTION     IR ANGIO INTRA EXTRACRAN SEL INTERNAL CAROTID UNI L MOD SED  08/15/2022   IR US  GUIDE VASC ACCESS RIGHT  08/15/2022   LAPAROTOMY     LOOP RECORDER INSERTION N/A 08/18/2022   Procedure: LOOP RECORDER INSERTION;  Surgeon: Inocencio Soyla Lunger, MD;  Location: [facility] INVASIVE CV LAB;  Service: Cardiovascular;  Laterality: N/A;   RADIOLOGY WITH ANESTHESIA N/A 08/14/2022   Procedure: IR WITH ANESTHESIA;  Surgeon: Dolphus Carrion, MD;  Location: [facility] OR;  Service: Radiology;  Laterality: N/A;   TEE WITHOUT CARDIOVERSION N/A 08/18/2022   Procedure: TRANSESOPHAGEAL ECHOCARDIOGRAM;  Surgeon: Alvan Ronal BRAVO, MD;  Location: [facility] INVASIVE CV LAB;  Service: Cardiovascular;  Laterality: N/A;   Patient Active Problem List   Diagnosis Date Noted   Essential hypertension, benign 08/18/2022   Hyperlipidemia 08/18/2022   DM (diabetes mellitus), type 2  (HCC) 08/18/2022   Obesity (BMI 30-39.9) 08/18/2022   CAD (coronary artery disease) 08/18/2022   OSA (obstructive sleep apnea) 08/18/2022   Acute ischemic left MCA stroke (HCC) 08/18/2022   Acute ischemic right MCA stroke (HCC) 08/14/2022   Hypertensive urgency 04/27/2017   ONSET DATE: 08/14/2022; date of referral 08/23/2022    REFERRING DIAG: P36.487 (ICD-10-CM) - Cerebral infarction due to unspecified occlusion or stenosis of left middle cerebral artery   THERAPY DIAG:  Aphasia   Apraxia   Cerebral infarction due to unspecified occlusion or stenosis of left middle cerebral artery (HCC)   Rationale for Evaluation and Treatment Rehabilitation   SUBJECTIVE:      PERTINENT HISTORY:  [name] is 58 year old male who presented to the [facility] ED on 08/14/2022 noted by wife to be very weak and confused and halfway enter out of the bathtub at home. Symptoms were sudden in onset. Last known well 9 PM. Patient presented with global aphasia. Code stroke activated. CT head negative for hemorrhage, aspects of 9, mild hyperdensity left MCA could indicate thrombus. Tenecteplase  administered. CTA showed left M1 occlusion and neurointerventional radiology contacted. Patient transferred to [facility] for thrombectomy. On route he was noted to have periorbital welts and uvular swelling. Was concern of possible angioedema secondary to tenecteplase . He was intubated for airway protection and underwent cerebral angiogram by Dr. Everitt Nile Erichsen.  Recanalization of the left MCA vascular tree noted. Critical care medicine consulted for management and placed on low-dose Neo-Synephrine infusion. Extubated on 4/23. 2D echo with EF of approximately 60 to 65% with trivial MVR. LDL 96, hemoglobin A1c 6.3%. He was started on heparin  subcutaneously for VTE prophylaxis.     DIAGNOSTIC FINDINGS:    CT Head - 08/14/2022 Mild hyperdensity of the left MCA   CT Head and Neck Angio - 08/14/2022 Proximal occlusion  of the left MCA M1 segment with poor collateralization throughout the MCA territory.   IR Angio - 08/15/2022 Interval recanalization of the left M1/MCA occlusion seen on prior CT angiogram likely as a result of TNK administration. Therefore, no intervention performed.   MRI 08/15/2022 Acute cortical infarcts along the left frontal operculum and left anterior insula with additional small area of acute cortical infarct within left parietal lobe. Other areas of faint cortical DWI hyperintensity throughout the left MCA territory, including the left putamen, may reflect additional areas of infarct or ischemia. 2. Loss of the left transverse sinus flow void is favored to reflect slow flow. If clinical suspicion for thrombosis, CT venography could be performed for further characterization.     PAIN:  Are you having pain? No   FALLS: Has patient fallen in last 6 months?  No   LIVING ENVIRONMENT: Lives with: lives with their family Lives in: House/apartment   PLOF:  Level of assistance: Independent with ADLs, Independent with IADLs Employment: Full-time employment     PATIENT GOALS    to be able to communicate again   SUBJECTIVE STATEMENT: Pt indicates that he hasn't been talking as much d/t holiday break from work and watching a lot of football college series have been one Pt accompanied by: self   OBJECTIVE:   TODAY'S TREATMENT:  Skilled treatment session focused on pt's communication goals. SLP facilitated session by providing the following interventions:  Functional Repetition of 250 basketball terms and slang - 85% with specific difficulty repeating consonant clusters Used multimodal communicate to answer questions about work    PATIENT EDUCATION: Education details: see above Person educated: Patient and his wife via phone -  Education method: Explanation, pt took Dealer comprehension: verbalized understanding and needs further education   HOME EXERCISE  PROGRAM:  Target basketball plays thru repetition and naming of hand signals.    GOALS:   Goals reviewed with patient? Yes   SHORT TERM GOALS: Target date: 10 sessions   SEE PREVIOUS NOTES FOR PROGRESS TOWARDS GOALS  Updated 01/25/2023  Updated 03/29/2023 6.  With minimal cues, pt will demonstrate improved reading comprehension by following 2 step written directions with 75% accuracy.  Baseline:  Goal status: INITIAL: MET- updated to With supervision level cues, pt will demonstrate improved reading comprehension by following 2 step written directions with 90% accuracy.   7.  With minimal cues, pt will type phrase level basic functional statements (3-4 words in length) in 5 out of 7 opportunities.  Baseline:  Goal status: INITIAL:GOOD PROGRESS MADE 3 OUT OF 7 OPPORTUNITIES typing 2-3 word length: MET - updated to With supervision level cues, pt will type phrase level basic functional statements (3-4 words in length) in 5 out of 7 opportunities.  8.  With minimal cues, pt will imitate basic functional phrase level statements with 75% speech intelligibility.  Baseline:  Goal status: INITIAL: GREAT PROGRESS MADE: MET - updated to With supervision level cues, pt will imitate basic functional phrase level statements with 90% speech intelligibility.  LONG TERM GOALS: Target date: 01/26/2023   SEE PREVIOUS NOTES FOR PROGRESS TOWARDS GOALS  Updated 01/25/2023  Updated 03/29/2023  Pt will use multimodal means of communication to express basic biographical information about himself, activities within his day and his family with minimal assistance: ongoing: great progress made    2.  Pt will demonstrate > 90% speech intelligibility when reading orientation information.  Baseline:  Goal status: INITIAL Goal status: ONGOING; progress made: MET  3. Pt will answer semi-complex yes/no questions with > 75% accuracy given minimal cues.   Baseline: 75% with simple yes/no questions  Goal status:  INITIAL: MET     ASSESSMENT:   CLINICAL IMPRESSION: Pt is a 58 year old right handed male who was seen today for a speech-language treatment d/t moderate transcortical sensory aphasia. Pt's aphasia is c/b moderate expressive deficits, mild receptive deficits, mild to  deficits in reading comprehension, spelling and written language.   Pt reports increased speech and then periods of inability c/b motor difficulty. He used eral and gestures to describe concept. See treatment note for more details.   OBJECTIVE IMPAIRMENTS include expressive language, receptive language, apraxia, and reading comprehension and ability to produce written language . These impairments are limiting patient from return to work, managing medications, managing appointments, managing finances, household responsibilities, ADLs/IADLs, and effectively communicating at home and in community. Factors affecting potential to achieve goals and functional outcome are severity of impairments. Patient will benefit from skilled SLP services to address above impairments and improve overall function.   REHAB POTENTIAL: Excellent   PLAN: SLP FREQUENCY: 3x/week to 4xweek   SLP DURATION: 12 weeks   PLANNED INTERVENTIONS: Language facilitation, Cueing hierachy, Functional tasks, Multimodal communication approach, SLP instruction and feedback, Compensatory strategies, and Patient/family education     Nandita Mathenia B. Rubbie, M.S., CCC-SLP, Tree Surgeon Certified Brain Injury Specialist [facility]  [facility] Office [phone] Ascom [phone] Fax [phone]

## 2023-05-03 ENCOUNTER — Ambulatory Visit: Payer: 59 | Admitting: Speech Pathology

## 2023-05-03 DIAGNOSIS — R482 Apraxia: Secondary | ICD-10-CM

## 2023-05-03 DIAGNOSIS — R4701 Aphasia: Secondary | ICD-10-CM | POA: Diagnosis not present

## 2023-05-03 NOTE — Therapy (Signed)
 OUTPATIENT SPEECH LANGUAGE PATHOLOGY  TREATMENT NOTE     Patient Name: [name] MRN: [id number] DOB:[birth year], 58 y.o., male Today's Date: 05/03/2023   PCP: Lauraine Lamarr Leak, NP REFERRING PROVIDER: Toribio Pitch, PA     End of Session - 05/03/23 1618     Visit Number 106    Number of Visits 129    Date for SLP Re-Evaluation 07/24/23    Authorization Type [employer]    Progress Note Due on Visit 110    SLP Start Time 1530    SLP Stop Time  1615    SLP Time Calculation (min) 45 min    Activity Tolerance Patient tolerated treatment well              Past Medical History:  Diagnosis Date   Hypertension    LVH (left ventricular hypertrophy)    Myocardial infarction (HCC)    Vitamin D deficiency    Past Surgical History:  Procedure Laterality Date   ANTERIOR CRUCIATE LIGAMENT REPAIR     COLONOSCOPY WITH PROPOFOL  N/A 04/11/2018   Procedure: COLONOSCOPY WITH PROPOFOL ;  Surgeon: Toledo, Ladell POUR, MD;  Location: [facility] ENDOSCOPY;  Service: Gastroenterology;  Laterality: N/A;   GASTRIC RESECTION     IR ANGIO INTRA EXTRACRAN SEL INTERNAL CAROTID UNI L MOD SED  08/15/2022   IR US  GUIDE VASC ACCESS RIGHT  08/15/2022   LAPAROTOMY     LOOP RECORDER INSERTION N/A 08/18/2022   Procedure: LOOP RECORDER INSERTION;  Surgeon: Inocencio Soyla Lunger, MD;  Location: [facility] INVASIVE CV LAB;  Service: Cardiovascular;  Laterality: N/A;   RADIOLOGY WITH ANESTHESIA N/A 08/14/2022   Procedure: IR WITH ANESTHESIA;  Surgeon: Dolphus Carrion, MD;  Location: [facility] OR;  Service: Radiology;  Laterality: N/A;   TEE WITHOUT CARDIOVERSION N/A 08/18/2022   Procedure: TRANSESOPHAGEAL ECHOCARDIOGRAM;  Surgeon: Alvan Ronal BRAVO, MD;  Location: [facility] INVASIVE CV LAB;  Service: Cardiovascular;  Laterality: N/A;   Patient Active Problem List   Diagnosis Date Noted   Essential hypertension, benign 08/18/2022   Hyperlipidemia 08/18/2022   DM (diabetes mellitus), type 2 (HCC) 08/18/2022    Obesity (BMI 30-39.9) 08/18/2022   CAD (coronary artery disease) 08/18/2022   OSA (obstructive sleep apnea) 08/18/2022   Acute ischemic left MCA stroke (HCC) 08/18/2022   Acute ischemic right MCA stroke (HCC) 08/14/2022   Hypertensive urgency 04/27/2017   ONSET DATE: 08/14/2022; date of referral 08/23/2022    REFERRING DIAG: P36.487 (ICD-10-CM) - Cerebral infarction due to unspecified occlusion or stenosis of left middle cerebral artery   THERAPY DIAG:  Aphasia   Apraxia   Cerebral infarction due to unspecified occlusion or stenosis of left middle cerebral artery (HCC)   Rationale for Evaluation and Treatment Rehabilitation   SUBJECTIVE:      PERTINENT HISTORY:  [name] is 59 year old male who presented to the [facility] ED on 08/14/2022 noted by wife to be very weak and confused and halfway enter out of the bathtub at home. Symptoms were sudden in onset. Last known well 9 PM. Patient presented with global aphasia. Code stroke activated. CT head negative for hemorrhage, aspects of 9, mild hyperdensity left MCA could indicate thrombus. Tenecteplase  administered. CTA showed left M1 occlusion and neurointerventional radiology contacted. Patient transferred to [facility] for thrombectomy. On route he was noted to have periorbital welts and uvular swelling. Was concern of possible angioedema secondary to tenecteplase . He was intubated for airway protection and underwent cerebral angiogram by Dr. Everitt Nile Erichsen. Recanalization  of the left MCA vascular tree noted. Critical care medicine consulted for management and placed on low-dose Neo-Synephrine infusion. Extubated on 4/23. 2D echo with EF of approximately 60 to 65% with trivial MVR. LDL 96, hemoglobin A1c 6.3%. He was started on heparin  subcutaneously for VTE prophylaxis.     DIAGNOSTIC FINDINGS:    CT Head - 08/14/2022 Mild hyperdensity of the left MCA   CT Head and Neck Angio - 08/14/2022 Proximal occlusion of the left MCA M1  segment with poor collateralization throughout the MCA territory.   IR Angio - 08/15/2022 Interval recanalization of the left M1/MCA occlusion seen on prior CT angiogram likely as a result of TNK administration. Therefore, no intervention performed.   MRI 08/15/2022 Acute cortical infarcts along the left frontal operculum and left anterior insula with additional small area of acute cortical infarct within left parietal lobe. Other areas of faint cortical DWI hyperintensity throughout the left MCA territory, including the left putamen, may reflect additional areas of infarct or ischemia. 2. Loss of the left transverse sinus flow void is favored to reflect slow flow. If clinical suspicion for thrombosis, CT venography could be performed for further characterization.     PAIN:  Are you having pain? No   FALLS: Has patient fallen in last 6 months?  No   LIVING ENVIRONMENT: Lives with: lives with their family Lives in: House/apartment   PLOF:  Level of assistance: Independent with ADLs, Independent with IADLs Employment: Full-time employment     PATIENT GOALS    to be able to communicate again   SUBJECTIVE STATEMENT: Big day, 3, 3 pt holding up 3 fingers indicating 3 incidences at workplace today Pt accompanied by: self   OBJECTIVE:   TODAY'S TREATMENT:  Skilled treatment session focused on pt's communication goals. SLP facilitated session by providing the following interventions:  Pt with difficulty communicating thought about basketball he continued to say one more on more with physical demonstration and he drew diagram of of basketball court. This clinical research associate was not successful in figuring out what pt was attempting to communicate.   Functional Repetition of 200 basketball terms and slang - 85% with specific difficulty repeating consonant clusters Used multimodal communicate to answer questions about work    PATIENT EDUCATION: Education details: see above Person  educated: Patient and his wife via phone -  Education method: Explanation, pt took Dealer comprehension: verbalized understanding and needs further education   HOME EXERCISE PROGRAM:  Target basketball plays thru repetition and naming of hand signals.    GOALS:   Goals reviewed with patient? Yes   SHORT TERM GOALS: Target date: 10 sessions   SEE PREVIOUS NOTES FOR PROGRESS TOWARDS GOALS  Updated 01/25/2023  Updated 03/29/2023 6.  With minimal cues, pt will demonstrate improved reading comprehension by following 2 step written directions with 75% accuracy.  Baseline:  Goal status: INITIAL: MET- updated to With supervision level cues, pt will demonstrate improved reading comprehension by following 2 step written directions with 90% accuracy.   7.  With minimal cues, pt will type phrase level basic functional statements (3-4 words in length) in 5 out of 7 opportunities.  Baseline:  Goal status: INITIAL:GOOD PROGRESS MADE 3 OUT OF 7 OPPORTUNITIES typing 2-3 word length: MET - updated to With supervision level cues, pt will type phrase level basic functional statements (3-4 words in length) in 5 out of 7 opportunities.  8.  With minimal cues, pt will imitate basic functional phrase level statements with 75% speech  intelligibility.  Baseline:  Goal status: INITIAL: GREAT PROGRESS MADE: MET - updated to With supervision level cues, pt will imitate basic functional phrase level statements with 90% speech intelligibility.   LONG TERM GOALS: Target date: 01/26/2023   SEE PREVIOUS NOTES FOR PROGRESS TOWARDS GOALS  Updated 01/25/2023  Updated 03/29/2023  Pt will use multimodal means of communication to express basic biographical information about himself, activities within his day and his family with minimal assistance: ongoing: great progress made    2.  Pt will demonstrate > 90% speech intelligibility when reading orientation information.  Baseline:  Goal status: INITIAL Goal  status: ONGOING; progress made: MET  3. Pt will answer semi-complex yes/no questions with > 75% accuracy given minimal cues.   Baseline: 75% with simple yes/no questions  Goal status: INITIAL: MET     ASSESSMENT:   CLINICAL IMPRESSION: Pt is a 58 year old right handed male who was seen today for a speech-language treatment d/t moderate transcortical sensory aphasia. Pt's aphasia is c/b moderate expressive deficits, mild receptive deficits, mild to  deficits in reading comprehension, spelling and written language.   Pt reports increased speech and then periods of inability c/b motor difficulty. He used verbal and gestures to describe concept. See treatment note for more details.   OBJECTIVE IMPAIRMENTS include expressive language, receptive language, apraxia, and reading comprehension and ability to produce written language . These impairments are limiting patient from return to work, managing medications, managing appointments, managing finances, household responsibilities, ADLs/IADLs, and effectively communicating at home and in community. Factors affecting potential to achieve goals and functional outcome are severity of impairments. Patient will benefit from skilled SLP services to address above impairments and improve overall function.   REHAB POTENTIAL: Excellent   PLAN: SLP FREQUENCY: 3x/week to 4xweek   SLP DURATION: 12 weeks   PLANNED INTERVENTIONS: Language facilitation, Cueing hierachy, Functional tasks, Multimodal communication approach, SLP instruction and feedback, Compensatory strategies, and Patient/family education     Bereket Gernert B. Rubbie, M.S., CCC-SLP, Tree Surgeon Certified Brain Injury Specialist [facility]  [facility] Rehabilitation Services Office [phone] Ascom [phone] Fax [phone]

## 2023-05-04 ENCOUNTER — Ambulatory Visit: Payer: 59 | Admitting: Speech Pathology

## 2023-05-08 ENCOUNTER — Ambulatory Visit: Payer: 59 | Admitting: Speech Pathology

## 2023-05-08 DIAGNOSIS — R4701 Aphasia: Secondary | ICD-10-CM

## 2023-05-08 DIAGNOSIS — I63512 Cerebral infarction due to unspecified occlusion or stenosis of left middle cerebral artery: Secondary | ICD-10-CM

## 2023-05-08 DIAGNOSIS — R482 Apraxia: Secondary | ICD-10-CM

## 2023-05-08 NOTE — Therapy (Signed)
 OUTPATIENT SPEECH LANGUAGE PATHOLOGY  TREATMENT NOTE     Patient Name: [name] MRN: [id number] DOB:April 15, 1966, 58 y.o., male Today's Date: 05/08/2023   PCP: Jon Lamarr Leak, NP REFERRING PROVIDER: Toribio Pitch, PA     End of Session - 05/08/23 1535     Visit Number 107    Number of Visits 129    Date for SLP Re-Evaluation 07/24/23    Authorization Type [employer]    Progress Note Due on Visit 110    SLP Start Time 1445    SLP Stop Time  1530    SLP Time Calculation (min) 45 min    Activity Tolerance Patient tolerated treatment well              Past Medical History:  Diagnosis Date   Hypertension    LVH (left ventricular hypertrophy)    Myocardial infarction (HCC)    Vitamin D deficiency    Past Surgical History:  Procedure Laterality Date   ANTERIOR CRUCIATE LIGAMENT REPAIR     COLONOSCOPY WITH PROPOFOL  N/A 04/11/2018   Procedure: COLONOSCOPY WITH PROPOFOL ;  Surgeon: Toledo, Ladell POUR, MD;  Location: [facility] ENDOSCOPY;  Service: Gastroenterology;  Laterality: N/A;   GASTRIC RESECTION     IR ANGIO INTRA EXTRACRAN SEL INTERNAL CAROTID UNI L MOD SED  08/15/2022   IR US  GUIDE VASC ACCESS RIGHT  08/15/2022   LAPAROTOMY     LOOP RECORDER INSERTION N/A 08/18/2022   Procedure: LOOP RECORDER INSERTION;  Surgeon: Jon Soyla Lunger, MD;  Location: [facility] INVASIVE CV LAB;  Service: Cardiovascular;  Laterality: N/A;   RADIOLOGY WITH ANESTHESIA N/A 08/14/2022   Procedure: IR WITH ANESTHESIA;  Surgeon: Jon Carrion, MD;  Location: [facility] OR;  Service: Radiology;  Laterality: N/A;   TEE WITHOUT CARDIOVERSION N/A 08/18/2022   Procedure: TRANSESOPHAGEAL ECHOCARDIOGRAM;  Surgeon: Jon Ronal BRAVO, MD;  Location: [facility] INVASIVE CV LAB;  Service: Cardiovascular;  Laterality: N/A;   Patient Active Problem List   Diagnosis Date Noted   Essential hypertension, benign 08/18/2022   Hyperlipidemia 08/18/2022   DM (diabetes mellitus), type 2 (HCC) 08/18/2022    Obesity (BMI 30-39.9) 08/18/2022   CAD (coronary artery disease) 08/18/2022   OSA (obstructive sleep apnea) 08/18/2022   Acute ischemic left MCA stroke (HCC) 08/18/2022   Acute ischemic right MCA stroke (HCC) 08/14/2022   Hypertensive urgency 04/27/2017   ONSET DATE: 08/14/2022; date of referral 08/23/2022    REFERRING DIAG: P36.487 (ICD-10-CM) - Cerebral infarction due to unspecified occlusion or stenosis of left middle cerebral artery   THERAPY DIAG:  Aphasia   Apraxia   Cerebral infarction due to unspecified occlusion or stenosis of left middle cerebral artery (HCC)   Rationale for Evaluation and Treatment Rehabilitation   SUBJECTIVE:      PERTINENT HISTORY:  [name] is 58 year old male who presented to the [facility] ED on 08/14/2022 noted by wife to be very weak and confused and halfway enter out of the bathtub at home. Symptoms were sudden in onset. Last known well 9 PM. Patient presented with global aphasia. Code stroke activated. CT head negative for hemorrhage, aspects of 9, mild hyperdensity left MCA could indicate thrombus. Tenecteplase  administered. CTA showed left M1 occlusion and neurointerventional radiology contacted. Patient transferred to [facility] for thrombectomy. On route he was noted to have periorbital welts and uvular swelling. Was concern of possible angioedema secondary to tenecteplase . He was intubated for airway protection and underwent cerebral angiogram by Dr. Everitt Nile Warner. Recanalization  of the left MCA vascular tree noted. Critical care medicine consulted for management and placed on low-dose Neo-Synephrine infusion. Extubated on 4/23. 2D echo with EF of approximately 60 to 65% with trivial MVR. LDL 96, hemoglobin A1c 6.3%. He was started on heparin  subcutaneously for VTE prophylaxis.     DIAGNOSTIC FINDINGS:    CT Head - 08/14/2022 Mild hyperdensity of the left MCA   CT Head and Neck Angio - 08/14/2022 Proximal occlusion of the left MCA M1  segment with poor collateralization throughout the MCA territory.   IR Angio - 08/15/2022 Interval recanalization of the left M1/MCA occlusion seen on prior CT angiogram likely as a result of TNK administration. Therefore, no intervention performed.   MRI 08/15/2022 Acute cortical infarcts along the left frontal operculum and left anterior insula with additional small area of acute cortical infarct within left parietal lobe. Other areas of faint cortical DWI hyperintensity throughout the left MCA territory, including the left putamen, may reflect additional areas of infarct or ischemia. 2. Loss of the left transverse sinus flow void is favored to reflect slow flow. If clinical suspicion for thrombosis, CT venography could be performed for further characterization.     PAIN:  Are you having pain? No   FALLS: Has patient fallen in last 6 months?  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 SLP facilitated session by providing the following interventions:   Pt used multi-modal means of communication to describe inability to read out loud. He describes increased ability to talk as well as increased ability to read (indicating reading comprehension) but no ability to actually read out loud. SLP employed several strategies such as reading along with SLP for basic basketball related sentences but SLP unable to fade model.    PATIENT EDUCATION: Education details: see above Person educated:  Patient and his wife via phone -  Education method: Explanation, pt took pictures Education comprehension: verbalized understanding and needs further education   HOME EXERCISE PROGRAM:  Target basketball plays thru repetition and naming of hand signals.    GOALS:   Goals reviewed with patient? Yes   SHORT TERM GOALS: Target date: 10 sessions   SEE PREVIOUS NOTES FOR PROGRESS TOWARDS GOALS  Updated 01/25/2023  Updated 03/29/2023 6.  With minimal cues, pt will demonstrate improved reading comprehension by following 2 step written directions with 75% accuracy.  Baseline:  Goal status: INITIAL: MET- updated to With supervision level cues, pt will demonstrate improved reading comprehension by following 2 step written directions with 90% accuracy.   7.  With minimal cues, pt will type phrase level basic functional statements (3-4 words in length) in 5 out of 7 opportunities.  Baseline:  Goal status: INITIAL:GOOD PROGRESS MADE 3 OUT OF 7 OPPORTUNITIES typing 2-3 word length: MET - updated to With supervision level cues, pt will type phrase level basic functional statements (3-4 words in length) in 5 out of 7 opportunities.  8.  With minimal cues, pt will imitate basic functional phrase level statements with  75% speech intelligibility.  Baseline:  Goal status: INITIAL: GREAT PROGRESS MADE: MET - updated to With supervision level cues, pt will imitate basic functional phrase level statements with 90% speech intelligibility.   LONG TERM GOALS: Target date: 01/26/2023   SEE PREVIOUS NOTES FOR PROGRESS TOWARDS GOALS  Updated 01/25/2023  Updated 03/29/2023  Pt will use multimodal means of communication to express basic biographical information about himself, activities within his day and his family with minimal assistance: ongoing: great progress made    2.  Pt will demonstrate > 90% speech intelligibility when reading orientation information.  Baseline:  Goal status: INITIAL Goal status:  ONGOING; progress made: MET  3. Pt will answer semi-complex yes/no questions with > 75% accuracy given minimal cues.   Baseline: 75% with simple yes/no questions  Goal status: INITIAL: MET     ASSESSMENT:   CLINICAL IMPRESSION: Pt is a 58 year old right handed male who was seen today for a speech-language treatment d/t moderate transcortical sensory aphasia. Pt's aphasia is c/b moderate expressive deficits, mild receptive deficits, mild to  deficits in reading comprehension, spelling and written language.   Pt reports increased speech and then periods of inability c/b motor difficulty. He used verbal and gestures to describe concept as well as an increased desire to be about read information out loud.  See treatment note for more details.   OBJECTIVE IMPAIRMENTS include expressive language, receptive language, apraxia, and reading comprehension and ability to produce written language . These impairments are limiting patient from return to work, managing medications, managing appointments, managing finances, household responsibilities, ADLs/IADLs, and effectively communicating at home and in community. Factors affecting potential to achieve goals and functional outcome are severity of impairments. Patient will benefit from skilled SLP services to address above impairments and improve overall function.   REHAB POTENTIAL: Excellent   PLAN: SLP FREQUENCY: 2x/week   SLP DURATION: 12 weeks   PLANNED INTERVENTIONS: Language facilitation, Cueing hierachy, Functional tasks, Multimodal communication approach, SLP instruction and feedback, Compensatory strategies, and Patient/family education     Willowdean Luhmann B. Rubbie, M.S., CCC-SLP, Tree Surgeon Certified Brain Injury Specialist [facility]  [facility] Rehabilitation Services Office [phone] Ascom [phone] Fax [phone]

## 2023-05-10 ENCOUNTER — Ambulatory Visit: Payer: 59 | Admitting: Speech Pathology

## 2023-05-10 DIAGNOSIS — R4701 Aphasia: Secondary | ICD-10-CM

## 2023-05-10 DIAGNOSIS — I63512 Cerebral infarction due to unspecified occlusion or stenosis of left middle cerebral artery: Secondary | ICD-10-CM

## 2023-05-10 DIAGNOSIS — R482 Apraxia: Secondary | ICD-10-CM

## 2023-05-10 NOTE — Therapy (Signed)
OUTPATIENT SPEECH LANGUAGE PATHOLOGY  TREATMENT NOTE     Patient Name: [name] MRN: [id number] DOB:1966-01-12, 58 y.o., male Today's Date: 05/10/2023   PCP: Myrene Buddy, NP REFERRING PROVIDER: Mariam Dollar, PA     End of Session - 05/10/23 1618     Visit Number 108    Number of Visits 129    Date for SLP Re-Evaluation 07/24/23    Authorization Type [employer]    Progress Note Due on Visit 110    SLP Start Time 1540    SLP Stop Time  1620    SLP Time Calculation (min) 40 min    Activity Tolerance Patient tolerated treatment well              Past Medical History:  Diagnosis Date   Hypertension    LVH (left ventricular hypertrophy)    Myocardial infarction (HCC)    Vitamin D deficiency    Past Surgical History:  Procedure Laterality Date   ANTERIOR CRUCIATE LIGAMENT REPAIR     COLONOSCOPY WITH PROPOFOL N/A 04/11/2018   Procedure: COLONOSCOPY WITH PROPOFOL;  Surgeon: Toledo, Boykin Nearing, MD;  Location: [facility] ENDOSCOPY;  Service: Gastroenterology;  Laterality: N/A;   GASTRIC RESECTION     IR ANGIO INTRA EXTRACRAN SEL INTERNAL CAROTID UNI L MOD SED  08/15/2022   IR US GUIDE VASC ACCESS RIGHT  08/15/2022   LAPAROTOMY     LOOP RECORDER INSERTION N/A 08/18/2022   Procedure: LOOP RECORDER INSERTION;  Surgeon: Regan Lemming, MD;  [facility]: [facility] INVAS[facility];  Service: Cardiovascular;  Laterality: N/A;   RADIOLOGY WITH ANESTHESIA N/A 08/14/2022   Procedure: IR WITH ANESTHESIA;  Surgeon: Julieanne Cotton, MD;  Location: [facility] OR;  Service: Radiology;  Laterality: N/A;   TEE WITHOUT CARDIOVERSION N/A 08/18/2022   Procedure: TRANSESOPHAGEAL ECHOCARDIOGRAM;  Surgeon: [name], MD;  Location: [facility] INVASIVE CV LAB;  Service: Cardiovascular;  Laterality: N/A;   Patient Active Problem List   Diagnosis Date Noted   Essential hypertension, benign 08/18/2022   Hyperlipidemia 08/18/2022   DM (diabetes mellitus), type 2 (HCC) 08/18/2022    Obesity (BMI 30-39.9) 08/18/2022   CAD (coronary artery disease) 08/18/2022   OSA (obstructive sleep apnea) 08/18/2022   Acute ischemic left MCA stroke (HCC) 08/18/2022   Acute ischemic right MCA stroke (HCC) 08/14/2022   Hypertensive urgency 04/27/2017   ONSET DATE: 08/14/2022; date of referral 08/23/2022    REFERRING DIAG: M57.846 (ICD-10-CM) - Cerebral infarction due to unspecified occlusion or stenosis of left middle cerebral artery   THERAPY DIAG:  Aphasia   Apraxia   Cerebral infarction due to unspecified occlusion or stenosis of left middle cerebral artery (HCC)   Rationale for Evaluation and Treatment Rehabilitation   SUBJECTIVE:      PERTINENT HISTORY:  [name] is 58 year old male who presented to the [facility] ED on 08/14/2022 noted by wife to be very weak and confused and halfway enter out of the bathtub at home. Symptoms were sudden in onset. Last known well 9 PM. Patient presented with global aphasia. Code stroke activated. CT head negative for hemorrhage, aspects of 9, mild hyperdensity left MCA could indicate thrombus. Tenecteplase administered. CTA showed left M1 occlusion and neurointerventional radiology contacted. Patient transferred to [facility] for thrombectomy. On route he was noted to have periorbital welts and uvular swelling. Was concern of possible angioedema secondary to tenecteplase. He was intubated for airway protection and underwent cerebral angiogram by Dr. Tommie Sams. Recanalization  of the left MCA vascular tree noted. Critical care medicine consulted for management and placed on low-dose Neo-Synephrine infusion. Extubated on 4/23. 2D echo with EF of approximately 60 to 65% with trivial MVR. LDL 96, hemoglobin A1c 6.3%. He was started on heparin subcutaneously for VTE prophylaxis.     DIAGNOSTIC FINDINGS:    CT Head - 08/14/2022 Mild hyperdensity of the left MCA   CT Head and Neck Angio - 08/14/2022 Proximal occlusion of the left MCA M1  segment with poor collateralization throughout the MCA territory.   IR Angio - 08/15/2022 Interval recanalization of the left M1/MCA occlusion seen on prior CT angiogram likely as a result of TNK administration. Therefore, no intervention performed.   MRI 08/15/2022 Acute cortical infarcts along the left frontal operculum and left anterior insula with additional small area of acute cortical infarct within left parietal lobe. Other areas of faint cortical DWI hyperintensity throughout the left MCA territory, including the left putamen, may reflect additional areas of infarct or ischemia. 2. Loss of the left transverse sinus flow void is favored to reflect slow flow. If clinical suspicion for thrombosis, CT venography could be performed for further characterization.     PAIN:  Are you having pain? No   FALLS: Has patient fallen in last 6 months?  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 SLP facilitated session by providing the following interventions:  Reading targeted thru use of alphabet and words beginning with specific initial letter. Pt able to read each letter of alphabet with Mod I and Min A required for imitating and responsive reading of letter paired with word. Pt visibly excited about these words.    PATIENT EDUCATION: Education details: see above Person educated: Patient and his wife via phone -  Education method: Explanation, pt took pictures Education comprehension:  verbalized understanding and needs further education   HOME EXERCISE PROGRAM:  Target basketball plays thru repetition and naming of hand signals.  Reading list of words   GOALS:   Goals reviewed with patient? Yes   SHORT TERM GOALS: Target date: 10 sessions   SEE PREVIOUS NOTES FOR PROGRESS TOWARDS GOALS  Updated 01/25/2023  Updated 03/29/2023 6.  With minimal cues, pt will demonstrate improved reading comprehension by following 2 step written directions with 75% accuracy.  Baseline:  Goal status: INITIAL: MET- updated to With supervision level cues, pt will demonstrate improved reading comprehension by following 2 step written directions with 90% accuracy.   7.  With minimal cues, pt will type phrase level basic functional statements (3-4 words in length) in 5 out of 7 opportunities.  Baseline:  Goal status: INITIAL:GOOD PROGRESS MADE 3 OUT OF 7 OPPORTUNITIES typing 2-3 word length: MET - updated to With supervision level cues, pt will type phrase level basic functional statements (3-4 words in length) in 5 out of 7 opportunities.  8.  With minimal cues, pt will imitate basic functional phrase level statements with 75% speech intelligibility.  Baseline:  Goal status: INITIAL: GREAT PROGRESS  MADE: MET - updated to With supervision level cues, pt will imitate basic functional phrase level statements with 90% speech intelligibility.   LONG TERM GOALS: Target date: 01/26/2023   SEE PREVIOUS NOTES FOR PROGRESS TOWARDS GOALS  Updated 01/25/2023  Updated 03/29/2023  Pt will use multimodal means of communication to express basic biographical information about himself, activities within his day and his family with minimal assistance: ongoing: great progress made    2.  Pt will demonstrate > 90% speech intelligibility when reading orientation information.  Baseline:  Goal status: INITIAL Goal status: ONGOING; progress made: MET  3. Pt will answer semi-complex yes/no questions with > 75%  accuracy given minimal cues.   Baseline: 75% with simple yes/no questions  Goal status: INITIAL: MET     ASSESSMENT:   CLINICAL IMPRESSION: Pt is a 58 year old right handed male who was seen today for a speech-language treatment d/t moderate transcortical sensory aphasia. Pt's aphasia is c/b moderate expressive deficits, mild receptive deficits, mild to  deficits in reading comprehension, spelling and written language.   Pt eager to target reading and was visibly excited about increased ability to read at the word level when paired with verbal support by this Clinical research associate. See treatment note for more details.   OBJECTIVE IMPAIRMENTS include expressive language, receptive language, apraxia, and reading comprehension and ability to produce written language . These impairments are limiting patient from return to work, managing medications, managing appointments, managing finances, household responsibilities, ADLs/IADLs, and effectively communicating at home and in community. Factors affecting potential to achieve goals and functional outcome are severity of impairments. Patient will benefit from skilled SLP services to address above impairments and improve overall function.   REHAB POTENTIAL: Excellent   PLAN: SLP FREQUENCY: 2x/week   SLP DURATION: 12 weeks   PLANNED INTERVENTIONS: Language facilitation, Cueing hierachy, Functional tasks, Multimodal communication approach, SLP instruction and feedback, Compensatory strategies, and Patient/family education     Jolicia Delira B. Dreama Saa, M.S., CCC-SLP, Tree surgeon Certified Brain Injury Specialist [facility]  [facility] Rehabilitation Services Office [phone] Ascom [phone] Fax [phone]

## 2023-05-15 ENCOUNTER — Ambulatory Visit: Payer: 59 | Admitting: Speech Pathology

## 2023-05-15 DIAGNOSIS — R4701 Aphasia: Secondary | ICD-10-CM | POA: Diagnosis not present

## 2023-05-15 DIAGNOSIS — I63512 Cerebral infarction due to unspecified occlusion or stenosis of left middle cerebral artery: Secondary | ICD-10-CM

## 2023-05-15 DIAGNOSIS — R482 Apraxia: Secondary | ICD-10-CM

## 2023-05-15 NOTE — Therapy (Signed)
OUTPATIENT SPEECH LANGUAGE PATHOLOGY  TREATMENT NOTE     Patient Name: [name] MRN: [id number] DOB:Jun 05, 1965, 58 y.o., male Today's Date: 05/15/2023   PCP: Myrene Buddy, NP REFERRING PROVIDER: Mariam Dollar, PA     End of Session - 05/15/23 1543     Visit Number 109    Number of Visits 129    Date for SLP Re-Evaluation 07/24/23    Authorization Type [employer]    Progress Note Due on Visit 110    SLP Start Time 1545    SLP Stop Time  1615    SLP Time Calculation (min) 30 min    Activity Tolerance Patient tolerated treatment well              Past Medical History:  Diagnosis Date   Hypertension    LVH (left ventricular hypertrophy)    Myocardial infarction (HCC)    Vitamin D deficiency    Past Surgical History:  Procedure Laterality Date   ANTERIOR CRUCIATE LIGAMENT REPAIR     COLONOSCOPY WITH PROPOFOL N/A 04/11/2018   Procedure: COLONOSCOPY WITH PROPOFOL;  Surgeon: Toledo, Boykin Nearing, MD;  Location: [facility] ENDOSCOPY;  Service: Gastroenterology;  Laterality: N/A;   GASTRIC RESECTION     IR ANGIO INTRA EXTRACRAN SEL INTERNAL CAROTID UNI L MOD SED  08/15/2022   IR US GUIDE VASC ACCESS RIGHT  08/15/2022   LAPAROTOMY     LOOP RECORDER INSERTION N/A 08/18/2022   Procedure: LOOP RECORDER INSERTION;  Surgeon: Regan Lemming, MD;  Location: [facility] INVASIVE CV LAB;  Service: Cardiovascular;  Laterality: N/A;   RADIOLOGY WITH ANESTHESIA N/A 08/14/2022   Procedure: IR WITH ANESTHESIA;  Surgeon: Julieanne Cotton, MD;  Location: [facility] OR;  Service: Radiology;  Laterality: N/A;   TEE WITHOUT CARDIOVERSION N/A 08/18/2022   Procedure: TRANSESOPHAGEAL ECHOCARDIOGRAM;  Surgeon: [name], MD;  Location: [facility] INVASIVE CV LAB;  Service: Cardiovascular;  Laterality: N/A;   Patient Active Problem List   Diagnosis Date Noted   Essential hypertension, benign 08/18/2022   Hyperlipidemia 08/18/2022   DM (diabetes mellitus), type 2 (HCC) 08/18/2022    Obesity (BMI 30-39.9) 08/18/2022   CAD (coronary artery disease) 08/18/2022   OSA (obstructive sleep apnea) 08/18/2022   Acute ischemic left MCA stroke (HCC) 08/18/2022   Acute ischemic right MCA stroke (HCC) 08/14/2022   Hypertensive urgency 04/27/2017   ONSET DATE: 08/14/2022; date of referral 08/23/2022    REFERRING DIAG: G95.621 (ICD-10-CM) - Cerebral infarction due to unspecified occlusion or stenosis of left middle cerebral artery   THERAPY DIAG:  Aphasia   Apraxia   Cerebral infarction due to unspecified occlusion or stenosis of left middle cerebral artery (HCC)   Rationale for Evaluation and Treatment Rehabilitation   SUBJECTIVE:      PERTINENT HISTORY:  Shayaan Hermiller is 58 year old male who presented to the [facility] ED on 08/14/2022 noted by wife to be very weak and confused and halfway enter out of the bathtub at home. Symptoms were sudden in onset. Last known well 9 PM. Patient presented with global aphasia. Code stroke activated. CT head negative for hemorrhage, aspects of 9, mild hyperdensity left MCA could indicate thrombus. Tenecteplase administered. CTA showed left M1 occlusion and neurointerventional radiology contacted. Patient transferred to [facility] for thrombectomy. On route he was noted to have periorbital welts and uvular swelling. Was concern of possible angioedema secondary to tenecteplase. He was intubated for airway protection and underwent cerebral angiogram by Dr. Tommie Sams. Recanalization  of the left MCA vascular tree noted. Critical care medicine consulted for management and placed on low-dose Neo-Synephrine infusion. Extubated on 4/23. 2D echo with EF of approximately 60 to 65% with trivial MVR. LDL 96, hemoglobin A1c 6.3%. He was started on heparin subcutaneously for VTE prophylaxis.     DIAGNOSTIC FINDINGS:    CT Head - 08/14/2022 Mild hyperdensity of the left MCA   CT Head and Neck Angio - 08/14/2022 Proximal occlusion of the left MCA M1  segment with poor collateralization throughout the MCA territory.   IR Angio - 08/15/2022 Interval recanalization of the left M1/MCA occlusion seen on prior CT angiogram likely as a result of TNK administration. Therefore, no intervention performed.   MRI 08/15/2022 Acute cortical infarcts along the left frontal operculum and left anterior insula with additional small area of acute cortical infarct within left parietal lobe. Other areas of faint cortical DWI hyperintensity throughout the left MCA territory, including the left putamen, may reflect additional areas of infarct or ischemia. 2. Loss of the left transverse sinus flow void is favored to reflect slow flow. If clinical suspicion for thrombosis, CT venography could be performed for further characterization.     PAIN:  Are you having pain? No   FALLS: Has patient fallen in last 6 months?  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 SLP facilitated session by providing the following interventions:  Reading targeted thru use of alphabet and words beginning with specific initial letter. Pt able to read each letter of alphabet with Mod I and Min A required for imitating and responsive reading of letter paired with word. With maximal faded to moderate multimodal assistance pt able to create 2 additional words for each letter but continues to require sentence completion to "read the word."    PATIENT EDUCATION: Education details: see above Person educated: Patient and his wife via phone -   Education method: Explanation, pt took pictures Education comprehension: verbalized understanding and needs further education   HOME EXERCISE PROGRAM:  Target basketball plays thru repetition and naming of hand signals.  Reading list of words   GOALS:   Goals reviewed with patient? Yes   SHORT TERM GOALS: Target date: 10 sessions   SEE PREVIOUS NOTES FOR PROGRESS TOWARDS GOALS  Updated 01/25/2023  Updated 03/29/2023 6.  With minimal cues, pt will demonstrate improved reading comprehension by following 2 step written directions with 75% accuracy.  Baseline:  Goal status: INITIAL: MET- updated to With supervision level cues, pt will demonstrate improved reading comprehension by following 2 step written directions with 90% accuracy.   7.  With minimal cues, pt will type phrase level basic functional statements (3-4 words in length) in 5 out of 7 opportunities.  Baseline:  Goal status: INITIAL:GOOD PROGRESS MADE 3 OUT OF 7 OPPORTUNITIES typing 2-3 word length: MET - updated to With supervision level cues, pt will type phrase level basic functional statements (3-4 words in length) in 5 out of 7 opportunities.  8.  With minimal cues, pt will imitate basic functional phrase level statements with 75% speech  intelligibility.  Baseline:  Goal status: INITIAL: GREAT PROGRESS MADE: MET - updated to With supervision level cues, pt will imitate basic functional phrase level statements with 90% speech intelligibility.   LONG TERM GOALS: Target date: 01/26/2023   SEE PREVIOUS NOTES FOR PROGRESS TOWARDS GOALS  Updated 01/25/2023  Updated 03/29/2023  Pt will use multimodal means of communication to express basic biographical information about himself, activities within his day and his family with minimal assistance: ongoing: great progress made    2.  Pt will demonstrate > 90% speech intelligibility when reading orientation information.  Baseline:  Goal status: INITIAL Goal status: ONGOING;  progress made: MET  3. Pt will answer semi-complex yes/no questions with > 75% accuracy given minimal cues.   Baseline: 75% with simple yes/no questions  Goal status: INITIAL: MET     ASSESSMENT:   CLINICAL IMPRESSION: Pt is a 58 year old right handed male who was seen today for a speech-language treatment d/t moderate transcortical sensory aphasia. Pt's aphasia is c/b moderate expressive deficits, mild receptive deficits, mild to  deficits in reading comprehension, spelling and written language.   Pt eager to target reading and was visibly excited about increased ability to read at the word level when paired with verbal support by this Clinical research associate. See treatment note for more details.   OBJECTIVE IMPAIRMENTS include expressive language, receptive language, apraxia, and reading comprehension and ability to produce written language . These impairments are limiting patient from return to work, managing medications, managing appointments, managing finances, household responsibilities, ADLs/IADLs, and effectively communicating at home and in community. Factors affecting potential to achieve goals and functional outcome are severity of impairments. Patient will benefit from skilled SLP services to address above impairments and improve overall function.   REHAB POTENTIAL: Excellent   PLAN: SLP FREQUENCY: 2x/week   SLP DURATION: 12 weeks   PLANNED INTERVENTIONS: Language facilitation, Cueing hierachy, Functional tasks, Multimodal communication approach, SLP instruction and feedback, Compensatory strategies, and Patient/family education     Marianne Golightly B. Dreama Saa, M.S., CCC-SLP, Tree surgeon Certified Brain Injury Specialist [facility]  [facility] Rehabilitation Services Office [phone] Ascom [phone] Fax [phone]

## 2023-05-17 ENCOUNTER — Ambulatory Visit: Payer: 59 | Admitting: Speech Pathology

## 2023-05-17 DIAGNOSIS — R4701 Aphasia: Secondary | ICD-10-CM

## 2023-05-17 DIAGNOSIS — R482 Apraxia: Secondary | ICD-10-CM

## 2023-05-17 DIAGNOSIS — I63512 Cerebral infarction due to unspecified occlusion or stenosis of left middle cerebral artery: Secondary | ICD-10-CM

## 2023-05-17 NOTE — Therapy (Signed)
OUTPATIENT SPEECH LANGUAGE PATHOLOGY  TREATMENT NOTE     Patient Name: [name] MRN: [id number] DOB:1965/07/21, 58 y.o., male Today's Date: 05/17/2023   PCP: Jon Buddy, NP REFERRING PROVIDER: Mariam Dollar, PA     End of Session - 05/17/23 1407     Visit Number 110    Number of Visits 129    Date for SLP Re-Evaluation 07/24/23    Authorization Type [employer]    Progress Note Due on Visit 110    SLP Start Time 1400    SLP Stop Time  1445    SLP Time Calculation (min) 45 min    Activity Tolerance Patient tolerated treatment well              Past Medical History:  Diagnosis Date   Hypertension    LVH (left ventricular hypertrophy)    Myocardial infarction (HCC)    Vitamin D deficiency    Past Surgical History:  Procedure Laterality Date   ANTERIOR CRUCIATE LIGAMENT REPAIR     COLONOSCOPY WITH PROPOFOL N/A 04/11/2018   Procedure: COLONOSCOPY WITH PROPOFOL;  Surgeon: Toledo, Boykin Nearing, MD;  Location: [facility] ENDOSCOPY;  Service: Gastroenterology;  Laterality: N/A;   GASTRIC RESECTION     IR ANGIO INTRA EXTRACRAN SEL INTERNAL CAROTID UNI L MOD SED  08/15/2022   IR US GUIDE VASC ACCESS RIGHT  08/15/2022   LAPAROTOMY     LOOP RECORDER INSERTION N/A 08/18/2022   Procedure: LOOP RECORDER INSERTION;  Surgeon: Jon Lemming, MD;  Location: [facility] INVASIVE CV LAB;  Service: Cardiovascular;  Laterality: N/A;   RADIOLOGY WITH ANESTHESIA N/A 08/14/2022   Procedure: IR WITH ANESTHESIA;  Surgeon: Jon Cotton, MD;  Location: [facility] OR;  Service: Radiology;  Laterality: N/A;   TEE WITHOUT CARDIOVERSION N/A 08/18/2022   Procedure: TRANSESOPHAGEAL ECHOCARDIOGRAM;  Surgeon: Jon Fus, MD;  Location: [facility] INVASIVE CV LAB;  Service: Cardiovascular;  Laterality: N/A;   Patient Active Problem List   Diagnosis Date Noted   Essential hypertension, benign 08/18/2022   Hyperlipidemia 08/18/2022   DM (diabetes mellitus), type 2 (HCC) 08/18/2022    Obesity (BMI 30-39.9) 08/18/2022   CAD (coronary artery disease) 08/18/2022   OSA (obstructive sleep apnea) 08/18/2022   Acute ischemic left MCA stroke (HCC) 08/18/2022   Acute ischemic right MCA stroke (HCC) 08/14/2022   Hypertensive urgency 04/27/2017   ONSET DATE: 08/14/2022; date of referral 08/23/2022    REFERRING DIAG: M57.846 (ICD-10-CM) - Cerebral infarction due to unspecified occlusion or stenosis of left middle cerebral artery   THERAPY DIAG:  Aphasia   Apraxia   Cerebral infarction due to unspecified occlusion or stenosis of left middle cerebral artery (HCC)   Rationale for Evaluation and Treatment Rehabilitation   SUBJECTIVE:      PERTINENT HISTORY:  [name] is 58 year old male who presented to the [facility] ED on 08/14/2022 noted by wife to be very weak and confused and halfway enter out of the bathtub at home. Symptoms were sudden in onset. Last known well 9 PM. Patient presented with global aphasia. Code stroke activated. CT head negative for hemorrhage, aspects of 9, mild hyperdensity left MCA could indicate thrombus. Tenecteplase administered. CTA showed left M1 occlusion and neurointerventional radiology contacted. Patient transferred to [facility] for thrombectomy. On route he was noted to have periorbital welts and uvular swelling. Was concern of possible angioedema secondary to tenecteplase. He was intubated for airway protection and underwent cerebral angiogram by Dr. Tommie Warner. Recanalization  of the left MCA vascular tree noted. Critical care medicine consulted for management and placed on low-dose Neo-Synephrine infusion. Extubated on 4/23. 2D echo with EF of approximately 60 to 65% with trivial MVR. LDL 96, hemoglobin A1c 6.3%. He was started on heparin subcutaneously for VTE prophylaxis.     DIAGNOSTIC FINDINGS:    CT Head - 08/14/2022 Mild hyperdensity of the left MCA   CT Head and Neck Angio - 08/14/2022 Proximal occlusion of the left MCA M1  segment with poor collateralization throughout the MCA territory.   IR Angio - 08/15/2022 Interval recanalization of the left M1/MCA occlusion seen on prior CT angiogram likely as a result of TNK administration. Therefore, no intervention performed.   MRI 08/15/2022 Acute cortical infarcts along the left frontal operculum and left anterior insula with additional small area of acute cortical infarct within left parietal lobe. Other areas of faint cortical DWI hyperintensity throughout the left MCA territory, including the left putamen, may reflect additional areas of infarct or ischemia. 2. Loss of the left transverse sinus flow void is favored to reflect slow flow. If clinical suspicion for thrombosis, CT venography could be performed for further characterization.     PAIN:  Are you having pain? No   FALLS: Has patient fallen in last 6 months?  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 SLP facilitated session by providing the following interventions:  Reading targeted thru use of alphabet and words beginning with specific initial letter. Pt able to read each letter of alphabet with Mod I and Min A required for imitating and responsive reading of letter paired with word. With maximal faded to moderate multimodal assistance pt able to create 2 additional words for each letter but continues to require sentence completion to "read the word."    PATIENT EDUCATION: Education details: see above Person educated: Patient and his wife via phone -   Education method: Explanation, pt took pictures Education comprehension: verbalized understanding and needs further education   HOME EXERCISE PROGRAM:  Target basketball plays thru repetition and naming of hand signals.  Reading list of words   GOALS:   Goals reviewed with patient? Yes   SHORT TERM GOALS: Target date: 10 sessions   SEE PREVIOUS NOTES FOR PROGRESS TOWARDS GOALS  Updated 01/25/2023  Updated 03/29/2023 6.  With minimal cues, pt will demonstrate improved reading comprehension by following 2 step written directions with 75% accuracy.  Baseline:  Goal status: INITIAL: MET- updated to With supervision level cues, pt will demonstrate improved reading comprehension by following 2 step written directions with 90% accuracy.   7.  With minimal cues, pt will type phrase level basic functional statements (3-4 words in length) in 5 out of 7 opportunities.  Baseline:  Goal status: INITIAL:GOOD PROGRESS MADE 3 OUT OF 7 OPPORTUNITIES typing 2-3 word length: MET - updated to With supervision level cues, pt will type phrase level basic functional statements (3-4 words in length) in 5 out of 7 opportunities.  8.  With minimal cues, pt will imitate basic functional phrase level statements with 75% speech  intelligibility.  Baseline:  Goal status: INITIAL: GREAT PROGRESS MADE: MET - updated to With supervision level cues, pt will imitate basic functional phrase level statements with 90% speech intelligibility.   LONG TERM GOALS: Target date: 01/26/2023   SEE PREVIOUS NOTES FOR PROGRESS TOWARDS GOALS  Updated 01/25/2023  Updated 03/29/2023  Pt will use multimodal means of communication to express basic biographical information about himself, activities within his day and his family with minimal assistance: ongoing: great progress made    2.  Pt will demonstrate > 90% speech intelligibility when reading orientation information.  Baseline:  Goal status: INITIAL Goal status: ONGOING;  progress made: MET  3. Pt will answer semi-complex yes/no questions with > 75% accuracy given minimal cues.   Baseline: 75% with simple yes/no questions  Goal status: INITIAL: MET     ASSESSMENT:   CLINICAL IMPRESSION: Pt is a 58 year old right handed male who was seen today for a speech-language treatment d/t moderate transcortical sensory aphasia. Pt's aphasia is c/b moderate expressive deficits, mild receptive deficits, mild to  deficits in reading comprehension, spelling and written language.   Pt eager to target reading and was visibly excited about increased ability to read at the word level when paired with verbal support by this Clinical research associate. See treatment note for more details.   OBJECTIVE IMPAIRMENTS include expressive language, receptive language, apraxia, and reading comprehension and ability to produce written language . These impairments are limiting patient from return to work, managing medications, managing appointments, managing finances, household responsibilities, ADLs/IADLs, and effectively communicating at home and in community. Factors affecting potential to achieve goals and functional outcome are severity of impairments. Patient will benefit from skilled SLP services to address above impairments and improve overall function.   REHAB POTENTIAL: Excellent   PLAN: SLP FREQUENCY: 2x/week   SLP DURATION: 12 weeks   PLANNED INTERVENTIONS: Language facilitation, Cueing hierachy, Functional tasks, Multimodal communication approach, SLP instruction and feedback, Compensatory strategies, and Patient/family education     Gerard Cantara B. Dreama Saa, M.S., CCC-SLP, Tree surgeon Certified Brain Injury Specialist [facility]  [facility] Rehabilitation Services Office [phone] Ascom [phone] Fax [phone]

## 2023-05-22 ENCOUNTER — Ambulatory Visit: Payer: 59 | Admitting: Speech Pathology

## 2023-05-22 DIAGNOSIS — I63512 Cerebral infarction due to unspecified occlusion or stenosis of left middle cerebral artery: Secondary | ICD-10-CM

## 2023-05-22 DIAGNOSIS — R482 Apraxia: Secondary | ICD-10-CM

## 2023-05-22 DIAGNOSIS — R4701 Aphasia: Secondary | ICD-10-CM

## 2023-05-22 NOTE — Therapy (Unsigned)
OUTPATIENT SPEECH LANGUAGE PATHOLOGY  TREATMENT NOTE     Patient Name: [name] MRN: [id number] DOB:1965/11/09, 58 y.o., male Today's Date: 05/22/2023   PCP: Myrene Buddy, NP REFERRING PROVIDER: Mariam Dollar, PA     End of Session - 05/22/23 1658     Visit Number 111    Number of Visits 129    Date for SLP Re-Evaluation 07/24/23    Authorization Type [employer]    Progress Note Due on Visit 120    SLP Start Time 1540    SLP Stop Time  1625    SLP Time Calculation (min) 45 min    Activity Tolerance Patient tolerated treatment well              Past Medical History:  Diagnosis Date   Hypertension    LVH (left ventricular hypertrophy)    Myocardial infarction (HCC)    Vitamin D deficiency    Past Surgical History:  Procedure Laterality Date   ANTERIOR CRUCIATE LIGAMENT REPAIR     COLONOSCOPY WITH PROPOFOL N/A 04/11/2018   Procedure: COLONOSCOPY WITH PROPOFOL;  Surgeon: Toledo, Boykin Nearing, MD;  Location: [facility] ENDOSCOPY;  Service: Gastroenterology;  Laterality: N/A;   GASTRIC RESECTION     IR ANGIO INTRA EXTRACRAN SEL INTERNAL CAROTID UNI L MOD SED  08/15/2022   IR US GUIDE VASC ACCESS RIGHT  08/15/2022   LAPAROTOMY     LOOP RECORDER INSERTION N/A 08/18/2022   Procedure: LOOP RECORDER INSERTION;  Surgeon: Regan Lemming, MD;  Location: [facility] INVASIVE CV LAB;  Service: Cardiovascular;  Laterality: N/A;   RADIOLOGY WITH ANESTHESIA N/A 08/14/2022   Procedure: IR WITH ANESTHESIA;  Surgeon: Julieanne Cotton, MD;  Location: [facility] OR;  Service: Radiology;  Laterality: N/A;   TEE WITHOUT CARDIOVERSION N/A 08/18/2022   Procedure: TRANSESOPHAGEAL ECHOCARDIOGRAM;  Surgeon: [name], MD;  Location: [facility] INVASIVE CV LAB;  Service: Cardiovascular;  Laterality: N/A;   Patient Active Problem List   Diagnosis Date Noted   Essential hypertension, benign 08/18/2022   Hyperlipidemia 08/18/2022   DM (diabetes mellitus), type 2 (HCC) 08/18/2022    Obesity (BMI 30-39.9) 08/18/2022   CAD (coronary artery disease) 08/18/2022   OSA (obstructive sleep apnea) 08/18/2022   Acute ischemic left MCA stroke (HCC) 08/18/2022   Acute ischemic right MCA stroke (HCC) 08/14/2022   Hypertensive urgency 04/27/2017   ONSET DATE: 08/14/2022; date of referral 08/23/2022    REFERRING DIAG: W09.811 (ICD-10-CM) - Cerebral infarction due to unspecified occlusion or stenosis of left middle cerebral artery   THERAPY DIAG:  Aphasia   Apraxia   Cerebral infarction due to unspecified occlusion or stenosis of left middle cerebral artery (HCC)   Rationale for Evaluation and Treatment Rehabilitation   SUBJECTIVE:      PERTINENT HISTORY:  [name] is 58 year old male who presented to the [facility] ED on 08/14/2022 noted by wife to be very weak and confused and halfway enter out of the bathtub at home. Symptoms were sudden in onset. Last known well 9 PM. Patient presented with global aphasia. Code stroke activated. CT head negative for hemorrhage, aspects of 9, mild hyperdensity left MCA could indicate thrombus. Tenecteplase administered. CTA showed left M1 occlusion and neurointerventional radiology contacted. Patient transferred to [facility] for thrombectomy. On route he was noted to have periorbital welts and uvular swelling. Was concern of possible angioedema secondary to tenecteplase. He was intubated for airway protection and underwent cerebral angiogram by Dr. Tommie Sams. Recanalization  of the left MCA vascular tree noted. Critical care medicine consulted for management and placed on low-dose Neo-Synephrine infusion. Extubated on 4/23. 2D echo with EF of approximately 60 to 65% with trivial MVR. LDL 96, hemoglobin A1c 6.3%. He was started on heparin subcutaneously for VTE prophylaxis.     DIAGNOSTIC FINDINGS:    CT Head - 08/14/2022 Mild hyperdensity of the left MCA   CT Head and Neck Angio - 08/14/2022 Proximal occlusion of the left MCA M1  segment with poor collateralization throughout the MCA territory.   IR Angio - 08/15/2022 Interval recanalization of the left M1/MCA occlusion seen on prior CT angiogram likely as a result of TNK administration. Therefore, no intervention performed.   MRI 08/15/2022 Acute cortical infarcts along the left frontal operculum and left anterior insula with additional small area of acute cortical infarct within left parietal lobe. Other areas of faint cortical DWI hyperintensity throughout the left MCA territory, including the left putamen, may reflect additional areas of infarct or ischemia. 2. Loss of the left transverse sinus flow void is favored to reflect slow flow. If clinical suspicion for thrombosis, CT venography could be performed for further characterization.     PAIN:  Are you having pain? No   FALLS: Has patient fallen in last 6 months?  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 SLP facilitated session by providing the following interventions:  Reading targeted thru use of alphabet and words beginning with specific initial letter. Pt able to read each letter of alphabet with Mod I and Min A required for imitating and responsive reading of letter paired with word. With maximal faded to moderate multimodal assistance pt able to create 2 additional words for each letter but continues to require sentence completion to "read the word."    PATIENT EDUCATION: Education details: see above Person educated: Patient and his wife via phone -   Education method: Explanation, pt took pictures Education comprehension: verbalized understanding and needs further education   HOME EXERCISE PROGRAM:  Target basketball plays thru repetition and naming of hand signals.  Reading list of words   GOALS:   Goals reviewed with patient? Yes   SHORT TERM GOALS: Target date: 10 sessions   SEE PREVIOUS NOTES FOR PROGRESS TOWARDS GOALS  Updated 01/25/2023  Updated 03/29/2023 6.  With minimal cues, pt will demonstrate improved reading comprehension by following 2 step written directions with 75% accuracy.  Baseline:  Goal status: INITIAL: MET- updated to With supervision level cues, pt will demonstrate improved reading comprehension by following 2 step written directions with 90% accuracy.   7.  With minimal cues, pt will type phrase level basic functional statements (3-4 words in length) in 5 out of 7 opportunities.  Baseline:  Goal status: INITIAL:GOOD PROGRESS MADE 3 OUT OF 7 OPPORTUNITIES typing 2-3 word length: MET - updated to With supervision level cues, pt will type phrase level basic functional statements (3-4 words in length) in 5 out of 7 opportunities.  8.  With minimal cues, pt will imitate basic functional phrase level statements with 75% speech  intelligibility.  Baseline:  Goal status: INITIAL: GREAT PROGRESS MADE: MET - updated to With supervision level cues, pt will imitate basic functional phrase level statements with 90% speech intelligibility.   LONG TERM GOALS: Target date: 01/26/2023   SEE PREVIOUS NOTES FOR PROGRESS TOWARDS GOALS  Updated 01/25/2023  Updated 03/29/2023  Pt will use multimodal means of communication to express basic biographical information about himself, activities within his day and his family with minimal assistance: ongoing: great progress made    2.  Pt will demonstrate > 90% speech intelligibility when reading orientation information.  Baseline:  Goal status: INITIAL Goal status: ONGOING;  progress made: MET  3. Pt will answer semi-complex yes/no questions with > 75% accuracy given minimal cues.   Baseline: 75% with simple yes/no questions  Goal status: INITIAL: MET     ASSESSMENT:   CLINICAL IMPRESSION: Pt is a 58 year old right handed male who was seen today for a speech-language treatment d/t moderate transcortical sensory aphasia. Pt's aphasia is c/b moderate expressive deficits, mild receptive deficits, mild to  deficits in reading comprehension, spelling and written language.   Pt eager to target reading and was visibly excited about increased ability to read at the word level when paired with verbal support by this Clinical research associate. See treatment note for more details.   OBJECTIVE IMPAIRMENTS include expressive language, receptive language, apraxia, and reading comprehension and ability to produce written language . These impairments are limiting patient from return to work, managing medications, managing appointments, managing finances, household responsibilities, ADLs/IADLs, and effectively communicating at home and in community. Factors affecting potential to achieve goals and functional outcome are severity of impairments. Patient will benefit from skilled SLP services to address above impairments and improve overall function.   REHAB POTENTIAL: Excellent   PLAN: SLP FREQUENCY: 2x/week   SLP DURATION: 12 weeks   PLANNED INTERVENTIONS: Language facilitation, Cueing hierachy, Functional tasks, Multimodal communication approach, SLP instruction and feedback, Compensatory strategies, and Patient/family education     Lecia Esperanza B. Dreama Saa, M.S., CCC-SLP, Tree surgeon Certified Brain Injury Specialist [facility]  [facility] Rehabilitation Services Office [phone] Ascom [phone] Fax [phone]

## 2023-05-24 ENCOUNTER — Ambulatory Visit: Payer: 59 | Admitting: Speech Pathology

## 2023-05-24 DIAGNOSIS — R4701 Aphasia: Secondary | ICD-10-CM | POA: Diagnosis not present

## 2023-05-24 DIAGNOSIS — I63512 Cerebral infarction due to unspecified occlusion or stenosis of left middle cerebral artery: Secondary | ICD-10-CM

## 2023-05-24 DIAGNOSIS — R482 Apraxia: Secondary | ICD-10-CM

## 2023-05-28 NOTE — Therapy (Signed)
OUTPATIENT SPEECH LANGUAGE PATHOLOGY  TREATMENT NOTE     Patient Name: [name] MRN: [id number] DOB:1966-02-10, 58 y.o., male Today's Date: 05/24/2023   PCP: Myrene Buddy, NP REFERRING PROVIDER: Mariam Dollar, PA     End of Session - 05/24/23 1541     Visit Number 112    Number of Visits 129    Date for SLP Re-Evaluation 07/24/23    Authorization Type [employer]    Progress Note Due on Visit 120    SLP Start Time 1540    SLP Stop Time  1625    SLP Time Calculation (min) 45 min    Activity Tolerance Patient tolerated treatment well              Past Medical History:  Diagnosis Date   Hypertension    LVH (left ventricular hypertrophy)    Myocardial infarction (HCC)    Vitamin D deficiency    Past Surgical History:  Procedure Laterality Date   ANTERIOR CRUCIATE LIGAMENT REPAIR     COLONOSCOPY WITH PROPOFOL N/A 04/11/2018   Procedure: COLONOSCOPY WITH PROPOFOL;  Surgeon: Toledo, Boykin Nearing, MD;  Location: [facility] ENDOSCOPY;  Service: Gastroenterology;  Laterality: N/A;   GASTRIC RESECTION     IR ANGIO INTRA EXTRACRAN SEL INTERNAL CAROTID UNI L MOD SED  08/15/2022   IR US GUIDE VASC ACCESS RIGHT  08/15/2022   LAPAROTOMY     LOOP RECORDER INSERTION N/A 08/18/2022   Procedure: LOOP RECORDER INSERTION;  Surgeon: Regan Lemming, MD;  [facility]: [facility] INVASIVE CV LAB;  Service: Cardiovascular;  Laterality: N/A;   RADIOLOGY WITH ANESTHESIA N/A 08/14/2022   Procedure: IR WITH ANESTHESIA;  Surgeon: Julieanne Cotton, MD;  Location: [facility] OR;  Service: Radiology;  Laterality: N/A;   TEE WITHOUT CARDIOVERSION N/A 08/18/2022   Procedure: TRANSESOPHAGEAL ECHOCARDIOGRAM;  Surgeon: [name], MD;  Location: [facility] INVASIVE CV LAB;  Service: Cardiovascular;  Laterality: N/A;   Patient Active Problem List   Diagnosis Date Noted   Essential hypertension, benign 08/18/2022   Hyperlipidemia 08/18/2022   DM (diabetes mellitus), type 2 (HCC) 08/18/2022    Obesity (BMI 30-39.9) 08/18/2022   CAD (coronary artery disease) 08/18/2022   OSA (obstructive sleep apnea) 08/18/2022   Acute ischemic left MCA stroke (HCC) 08/18/2022   Acute ischemic right MCA stroke (HCC) 08/14/2022   Hypertensive urgency 04/27/2017   ONSET DATE: 08/14/2022; date of referral 08/23/2022    REFERRING DIAG: N02.725 (ICD-10-CM) - Cerebral infarction due to unspecified occlusion or stenosis of left middle cerebral artery   THERAPY DIAG:  Aphasia   Apraxia   Cerebral infarction due to unspecified occlusion or stenosis of left middle cerebral artery (HCC)   Rationale for Evaluation and Treatment Rehabilitation   SUBJECTIVE:      PERTINENT HISTORY:  [name] is 58 year old male who presented to the [facility] ED on 08/14/2022 noted by wife to be very weak and confused and halfway enter out of the bathtub at home. Symptoms were sudden in onset. Last known well 9 PM. Patient presented with global aphasia. Code stroke activated. CT head negative for hemorrhage, aspects of 9, mild hyperdensity left MCA could indicate thrombus. Tenecteplase administered. CTA showed left M1 occlusion and neurointerventional radiology contacted. Patient transferred to [facility] for thrombectomy. On route he was noted to have periorbital welts and uvular swelling. Was concern of possible angioedema secondary to tenecteplase. He was intubated for airway protection and underwent cerebral angiogram by Dr. Tommie Sams. Recanalization  of the left MCA vascular tree noted. Critical care medicine consulted for management and placed on low-dose Neo-Synephrine infusion. Extubated on 4/23. 2D echo with EF of approximately 60 to 65% with trivial MVR. LDL 96, hemoglobin A1c 6.3%. He was started on heparin subcutaneously for VTE prophylaxis.     DIAGNOSTIC FINDINGS:    CT Head - 08/14/2022 Mild hyperdensity of the left MCA   CT Head and Neck Angio - 08/14/2022 Proximal occlusion of the left MCA M1  segment with poor collateralization throughout the MCA territory.   IR Angio - 08/15/2022 Interval recanalization of the left M1/MCA occlusion seen on prior CT angiogram likely as a result of TNK administration. Therefore, no intervention performed.   MRI 08/15/2022 Acute cortical infarcts along the left frontal operculum and left anterior insula with additional small area of acute cortical infarct within left parietal lobe. Other areas of faint cortical DWI hyperintensity throughout the left MCA territory, including the left putamen, may reflect additional areas of infarct or ischemia. 2. Loss of the left transverse sinus flow void is favored to reflect slow flow. If clinical suspicion for thrombosis, CT venography could be performed for further characterization.     PAIN:  Are you having pain? No   FALLS: Has patient fallen in last 6 months?  No   LIVING ENVIRONMENT: Lives with: lives with their family Lives in: House/apartment   PLOF:  Level of assistance: Independent with ADLs, Independent with IADLs Employment: Full-time employment     PATIENT GOALS    to be able to communicate again   SUBJECTIVE STATEMENT: Pt appeared in better spirits - along with gestures, pt indicated that his verbal communication had been improved over the weekend Pt accompanied by: self   OBJECTIVE:   TODAY'S TREATMENT:  Skilled treatment session focused on pt's communication goals. SLP facilitated session by providing the following interventions:  Pt arrived with vocabulary words entered into Text to Speech App. "[name] did it" referring to someone entering the words for him to practice. Pt able to add written words accurately to letters within the alphabet. While his reading comprehension and spelling continues to be > 95% accurate, he benefits from semantic cues, sentence completion and occasional initial phoneme for verbally producing the words.    PATIENT EDUCATION: Education details: see  above Person educated: Patient and his wife via phone -  Education method: Explanation, pt took pictures Education comprehension: verbalized understanding and needs further education   HOME EXERCISE PROGRAM:  Target basketball plays thru repetition and naming of hand signals.  Reading list of words   GOALS:   Goals reviewed with patient? Yes   SHORT TERM GOALS: Target date: 10 sessions   SEE PREVIOUS NOTES FOR PROGRESS TOWARDS GOALS  Updated 01/25/2023  Updated 03/29/2023 6.  With minimal cues, pt will demonstrate improved reading comprehension by following 2 step written directions with 75% accuracy.  Baseline:  Goal status: INITIAL: MET- updated to With supervision level cues, pt will demonstrate improved reading comprehension by following 2 step written directions with 90% accuracy.   7.  With minimal cues, pt will type phrase level basic functional statements (3-4 words in length) in 5 out of 7 opportunities.  Baseline:  Goal status: INITIAL:GOOD PROGRESS MADE 3 OUT OF 7 OPPORTUNITIES typing 2-3 word length: MET - updated to With supervision level cues, pt will type phrase level basic functional statements (3-4 words in length) in 5 out of 7 opportunities.  8.  With minimal cues, pt will imitate  basic functional phrase level statements with 75% speech intelligibility.  Baseline:  Goal status: INITIAL: GREAT PROGRESS MADE: MET - updated to With supervision level cues, pt will imitate basic functional phrase level statements with 90% speech intelligibility.   LONG TERM GOALS: Target date: 01/26/2023   SEE PREVIOUS NOTES FOR PROGRESS TOWARDS GOALS  Updated 01/25/2023  Updated 03/29/2023  Pt will use multimodal means of communication to express basic biographical information about himself, activities within his day and his family with minimal assistance: ongoing: great progress made    2.  Pt will demonstrate > 90% speech intelligibility when reading orientation information.   Baseline:  Goal status: INITIAL Goal status: ONGOING; progress made: MET  3. Pt will answer semi-complex yes/no questions with > 75% accuracy given minimal cues.   Baseline: 75% with simple yes/no questions  Goal status: INITIAL: MET     ASSESSMENT:   CLINICAL IMPRESSION: Pt is a 58 year old right handed male who was seen today for a speech-language treatment d/t moderate transcortical sensory aphasia. Pt's aphasia is c/b moderate expressive deficits, mild receptive deficits, mild to  deficits in reading comprehension, spelling and written language.   Pt eager to target reading and was visibly excited about increased ability to read at the word level when paired with verbal support by this Clinical research associate. See treatment note for more details.   OBJECTIVE IMPAIRMENTS include expressive language, receptive language, apraxia, and reading comprehension and ability to produce written language . These impairments are limiting patient from return to work, managing medications, managing appointments, managing finances, household responsibilities, ADLs/IADLs, and effectively communicating at home and in community. Factors affecting potential to achieve goals and functional outcome are severity of impairments. Patient will benefit from skilled SLP services to address above impairments and improve overall function.   REHAB POTENTIAL: Excellent   PLAN: SLP FREQUENCY: 2x/week   SLP DURATION: 12 weeks   PLANNED INTERVENTIONS: Language facilitation, Cueing hierachy, Functional tasks, Multimodal communication approach, SLP instruction and feedback, Compensatory strategies, and Patient/family education     Torie Priebe B. Dreama Saa, M.S., CCC-SLP, Tree surgeon Certified Brain Injury Specialist [facility]  [facility] Rehabilitation Services Office [phone] Ascom [phone] Fax [phone]

## 2023-05-29 ENCOUNTER — Ambulatory Visit: Payer: 59 | Attending: Physician Assistant | Admitting: Speech Pathology

## 2023-05-29 ENCOUNTER — Ambulatory Visit (INDEPENDENT_AMBULATORY_CARE_PROVIDER_SITE_OTHER): Payer: BC Managed Care – PPO

## 2023-05-29 DIAGNOSIS — I639 Cerebral infarction, unspecified: Secondary | ICD-10-CM

## 2023-05-29 DIAGNOSIS — I63512 Cerebral infarction due to unspecified occlusion or stenosis of left middle cerebral artery: Secondary | ICD-10-CM | POA: Insufficient documentation

## 2023-05-29 DIAGNOSIS — R4701 Aphasia: Secondary | ICD-10-CM | POA: Insufficient documentation

## 2023-05-29 DIAGNOSIS — R482 Apraxia: Secondary | ICD-10-CM | POA: Insufficient documentation

## 2023-05-29 LAB — CUP PACEART REMOTE DEVICE CHECK
Date Time Interrogation Session: 20250202232127
Implantable Pulse Generator Implant Date: 20240425

## 2023-05-30 NOTE — Therapy (Signed)
OUTPATIENT SPEECH LANGUAGE PATHOLOGY  TREATMENT NOTE     Patient Name: [name] MRN: [id number] DOB:1965-06-29, 58 y.o., male Today's Date: 05/24/2023   PCP: Myrene Buddy, NP REFERRING PROVIDER: Mariam Dollar, PA     End of Session - 05/24/23 1541     Visit Number 112    Number of Visits 129    Date for SLP Re-Evaluation 07/24/23    Authorization Type [employer]    Progress Note Due on Visit 120    SLP Start Time 1540    SLP Stop Time  1625    SLP Time Calculation (min) 45 min    Activity Tolerance Patient tolerated treatment well              Past Medical History:  Diagnosis Date   Hypertension    LVH (left ventricular hypertrophy)    Myocardial infarction (HCC)    Vitamin D deficiency    Past Surgical History:  Procedure Laterality Date   ANTERIOR CRUCIATE LIGAMENT REPAIR     COLONOSCOPY WITH PROPOFOL N/A 04/11/2018   Procedure: COLONOSCOPY WITH PROPOFOL;  Surgeon: Toledo, Boykin Nearing, MD;  Location: [facility] ENDOSCOPY;  Service: Gastroenterology;  Laterality: N/A;   GASTRIC RESECTION     IR ANGIO INTRA EXTRACRAN SEL INTERNAL CAROTID UNI L MOD SED  08/15/2022   IR US GUIDE VASC ACCESS RIGHT  08/15/2022   LAPAROTOMY     LOOP RECORDER INSERTION N/A 08/18/2022   Procedure: LOOP RECORDER INSERTION;  Surgeon: Regan Lemming, MD;  Location: [facility] INVASIVE CV LAB;  Service: Cardiovascular;  Laterality: N/A;   RADIOLOGY WITH ANESTHESIA N/A 08/14/2022   Procedure: IR WITH ANESTHESIA;  Surgeon: Julieanne Cotton, MD;  Location: [facility] OR;  Service: Radiology;  Laterality: N/A;   TEE WITHOUT CARDIOVERSION N/A 08/18/2022   Procedure: TRANSESOPHAGEAL ECHOCARDIOGRAM;  Surgeon: [name], MD;  Location: [facility] INVASIVE CV LAB;  Service: Cardiovascular;  Laterality: N/A;   Patient Active Problem List   Diagnosis Date Noted   Essential hypertension, benign 08/18/2022   Hyperlipidemia 08/18/2022   DM (diabetes mellitus), type 2 (HCC) 08/18/2022    Obesity (BMI 30-39.9) 08/18/2022   CAD (coronary artery disease) 08/18/2022   OSA (obstructive sleep apnea) 08/18/2022   Acute ischemic left MCA stroke (HCC) 08/18/2022   Acute ischemic right MCA stroke (HCC) 08/14/2022   Hypertensive urgency 04/27/2017   ONSET DATE: 08/14/2022; date of referral 08/23/2022    REFERRING DIAG: I69.629 (ICD-10-CM) - Cerebral infarction due to unspecified occlusion or stenosis of left middle cerebral artery   THERAPY DIAG:  Aphasia   Apraxia   Cerebral infarction due to unspecified occlusion or stenosis of left middle cerebral artery (HCC)   Rationale for Evaluation and Treatment Rehabilitation   SUBJECTIVE:      PERTINENT HISTORY:  [name] is 58 year old male who presented to the [facility] ED on 08/14/2022 noted by wife to be very weak and confused and halfway enter out of the bathtub at home. Symptoms were sudden in onset. Last known well 9 PM. Patient presented with global aphasia. Code stroke activated. CT head negative for hemorrhage, aspects of 9, mild hyperdensity left MCA could indicate thrombus. Tenecteplase administered. CTA showed left M1 occlusion and neurointerventional radiology contacted. Patient transferred to [facility] for thrombectomy. On route he was noted to have periorbital welts and uvular swelling. Was concern of possible angioedema secondary to tenecteplase. He was intubated for airway protection and underwent cerebral angiogram by Dr. Tommie Sams. Recanalization  of the left MCA vascular tree noted. Critical care medicine consulted for management and placed on low-dose Neo-Synephrine infusion. Extubated on 4/23. 2D echo with EF of approximately 60 to 65% with trivial MVR. LDL 96, hemoglobin A1c 6.3%. He was started on heparin subcutaneously for VTE prophylaxis.     DIAGNOSTIC FINDINGS:    CT Head - 08/14/2022 Mild hyperdensity of the left MCA   CT Head and Neck Angio - 08/14/2022 Proximal occlusion of the left MCA M1  segment with poor collateralization throughout the MCA territory.   IR Angio - 08/15/2022 Interval recanalization of the left M1/MCA occlusion seen on prior CT angiogram likely as a result of TNK administration. Therefore, no intervention performed.   MRI 08/15/2022 Acute cortical infarcts along the left frontal operculum and left anterior insula with additional small area of acute cortical infarct within left parietal lobe. Other areas of faint cortical DWI hyperintensity throughout the left MCA territory, including the left putamen, may reflect additional areas of infarct or ischemia. 2. Loss of the left transverse sinus flow void is favored to reflect slow flow. If clinical suspicion for thrombosis, CT venography could be performed for further characterization.     PAIN:  Are you having pain? No   FALLS: Has patient fallen in last 6 months?  No   LIVING ENVIRONMENT: Lives with: lives with their family Lives in: House/apartment   PLOF:  Level of assistance: Independent with ADLs, Independent with IADLs Employment: Full-time employment     PATIENT GOALS    to be able to communicate again   SUBJECTIVE STATEMENT: Pt indicated that his verbal communication is better during real-life functional communication Pt accompanied by: self   OBJECTIVE:   TODAY'S TREATMENT:  Skilled treatment session focused on pt's communication goals. SLP facilitated session by providing the following interventions:  SLP continued added words to pt's Text to Speech and also pt with > 95% speech intelligibility when repeating the words. He also demonstrates improved verbal utterances when describing upcoming basketball practice and using functional language during practice.    PATIENT EDUCATION: Education details: see above Person educated: Patient and his wife via phone -  Education method: Explanation, pt took pictures Education comprehension: verbalized understanding and needs further  education   HOME EXERCISE PROGRAM:  Target basketball plays thru repetition and naming of hand signals.  Reading list of words   GOALS:   Goals reviewed with patient? Yes   SHORT TERM GOALS: Target date: 10 sessions   SEE PREVIOUS NOTES FOR PROGRESS TOWARDS GOALS  Updated 01/25/2023  Updated 03/29/2023 6.  With minimal cues, pt will demonstrate improved reading comprehension by following 2 step written directions with 75% accuracy.  Baseline:  Goal status: INITIAL: MET- updated to With supervision level cues, pt will demonstrate improved reading comprehension by following 2 step written directions with 90% accuracy.   7.  With minimal cues, pt will type phrase level basic functional statements (3-4 words in length) in 5 out of 7 opportunities.  Baseline:  Goal status: INITIAL:GOOD PROGRESS MADE 3 OUT OF 7 OPPORTUNITIES typing 2-3 word length: MET - updated to With supervision level cues, pt will type phrase level basic functional statements (3-4 words in length) in 5 out of 7 opportunities.  8.  With minimal cues, pt will imitate basic functional phrase level statements with 75% speech intelligibility.  Baseline:  Goal status: INITIAL: GREAT PROGRESS MADE: MET - updated to With supervision level cues, pt will imitate basic functional phrase level statements with  90% speech intelligibility.   LONG TERM GOALS: Target date: 01/26/2023   SEE PREVIOUS NOTES FOR PROGRESS TOWARDS GOALS  Updated 01/25/2023  Updated 03/29/2023  Pt will use multimodal means of communication to express basic biographical information about himself, activities within his day and his family with minimal assistance: ongoing: great progress made    2.  Pt will demonstrate > 90% speech intelligibility when reading orientation information.  Baseline:  Goal status: INITIAL Goal status: ONGOING; progress made: MET  3. Pt will answer semi-complex yes/no questions with > 75% accuracy given minimal cues.   Baseline:  75% with simple yes/no questions  Goal status: INITIAL: MET     ASSESSMENT:   CLINICAL IMPRESSION: Pt is a 58 year old right handed male who was seen today for a speech-language treatment d/t moderate transcortical sensory aphasia. Pt's aphasia is c/b moderate expressive deficits, mild receptive deficits, mild to  deficits in reading comprehension, spelling and written language.   Pt continues to be eager to participate in therapy as well as improve his communication. See treatment note for more details. In addition, he describes frustration with knowing what he wants to say "I know, I know" and then being unable to say it (gesturing closed mouth). 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 Will check with pt's wife to see how it goes with pt.   OBJECTIVE IMPAIRMENTS include expressive language, receptive language, apraxia, and reading comprehension and ability to produce written language . These impairments are limiting patient from return to work, managing medications, managing appointments, managing finances, household responsibilities, ADLs/IADLs, and effectively communicating at home and in community. Factors affecting potential to achieve goals and functional outcome are severity of impairments. Patient will benefit from skilled SLP services to address above impairments and improve overall function.   REHAB POTENTIAL: Excellent   PLAN: SLP FREQUENCY: 2x/week   SLP DURATION: 12 weeks   PLANNED INTERVENTIONS: Language facilitation, Cueing hierachy, Functional tasks, Multimodal communication approach, SLP instruction and feedback, Compensatory strategies, and Patient/family education     Zaine Elsass B. Dreama Saa, M.S., CCC-SLP, Tree surgeon Certified Brain Injury Specialist [facility]  [facility] Rehabilitation Services Office [phone] Ascom [phone] Fax [phone]

## 2023-05-31 ENCOUNTER — Ambulatory Visit: Payer: 59 | Admitting: Speech Pathology

## 2023-06-01 ENCOUNTER — Telehealth: Payer: Self-pay | Admitting: Speech Pathology

## 2023-06-01 NOTE — Telephone Encounter (Signed)
 Pt was a no call/no show for his appt yesterday. This clinical research associate communicated via text with pt's wife who informs that pt visited his dad d/t surgery. Pt and his wife are aware of future appt.   Kattleya Kuhnert B. Rubbie, M.S., CCC-SLP, Tree Surgeon Certified Brain Injury Specialist [facility]  [facility] Rehabilitation Services Office [phone] Ascom [phone] Fax [phone]

## 2023-06-05 ENCOUNTER — Ambulatory Visit: Payer: 59 | Admitting: Speech Pathology

## 2023-06-07 ENCOUNTER — Ambulatory Visit: Payer: 59 | Admitting: Speech Pathology

## 2023-06-07 DIAGNOSIS — R4701 Aphasia: Secondary | ICD-10-CM

## 2023-06-07 DIAGNOSIS — R482 Apraxia: Secondary | ICD-10-CM

## 2023-06-07 DIAGNOSIS — I63512 Cerebral infarction due to unspecified occlusion or stenosis of left middle cerebral artery: Secondary | ICD-10-CM

## 2023-06-07 NOTE — Therapy (Unsigned)
OUTPATIENT SPEECH LANGUAGE PATHOLOGY  TREATMENT NOTE DISCHARGE SUMMARY     Patient Name: [name] MRN: [id number] DOB:12-Jun-1965, 58 y.o., male Today's Date: 06/07/2023   PCP: Myrene Buddy, NP REFERRING PROVIDER: Mariam Dollar, PA   End of Session - 06/09/23 0833     Visit Number 114    Number of Visits 129    Date for SLP Re-Evaluation 07/24/23    Authorization Type [employer]    Progress Note Due on Visit 120    SLP Start Time 1530    SLP Stop Time  1615    SLP Time Calculation (min) 45 min    Activity Tolerance Patient tolerated treatment well              Past Medical History:  Diagnosis Date   Hypertension    LVH (left ventricular hypertrophy)    Myocardial infarction (HCC)    Vitamin D deficiency    Past Surgical History:  Procedure Laterality Date   ANTERIOR CRUCIATE LIGAMENT REPAIR     COLONOSCOPY WITH PROPOFOL N/A 04/11/2018   Procedure: COLONOSCOPY WITH PROPOFOL;  Surgeon: Toledo, Boykin Nearing, MD;  Location: [facility] ENDOSCOPY;  Service: Gastroenterology;  Laterality: N/A;   GASTRIC RESECTION     IR ANGIO INTRA EXTRACRAN SEL INTERNAL CAROTID UNI L MOD SED  08/15/2022   IR US GUIDE VASC ACCESS RIGHT  08/15/2022   LAPAROTOMY     LOOP RECORDER INSERTION N/A 08/18/2022   Procedure: LOOP RECORDER INSERTION;  Surgeon: Regan Lemming, MD;  Location: [facility] INVASIVE CV LAB;  Service: Cardiovascular;  Laterality: N/A;   RADIOLOGY WITH ANESTHESIA N/A 08/14/2022   Procedure: IR WITH ANESTHESIA;  Surgeon: Julieanne Cotton, MD;  Location: [facility] OR;  Service: Radiology;  Laterality: N/A;   TEE WITHOUT CARDIOVERSION N/A 08/18/2022   Procedure: TRANSESOPHAGEAL ECHOCARDIOGRAM;  Surgeon: [name], MD;  Location: [facility] INVASIVE CV LAB;  Service: Cardiovascular;  Laterality: N/A;   Patient Active Problem List   Diagnosis Date Noted   Essential hypertension, benign 08/18/2022   Hyperlipidemia 08/18/2022   DM (diabetes mellitus), type 2  (HCC) 08/18/2022   Obesity (BMI 30-39.9) 08/18/2022   CAD (coronary artery disease) 08/18/2022   OSA (obstructive sleep apnea) 08/18/2022   Acute ischemic left MCA stroke (HCC) 08/18/2022   Acute ischemic right MCA stroke (HCC) 08/14/2022   Hypertensive urgency 04/27/2017   ONSET DATE: 08/14/2022; date of referral 08/23/2022    REFERRING DIAG: W09.811 (ICD-10-CM) - Cerebral infarction due to unspecified occlusion or stenosis of left middle cerebral artery   THERAPY DIAG:  Aphasia   Apraxia   Cerebral infarction due to unspecified occlusion or stenosis of left middle cerebral artery (HCC)   Rationale for Evaluation and Treatment Rehabilitation   SUBJECTIVE:      PERTINENT HISTORY:  [name] is 58 year old male who presented to the [facility] ED on 08/14/2022 noted by wife to be very weak and confused and halfway enter out of the bathtub at home. Symptoms were sudden in onset. Last known well 9 PM. Patient presented with global aphasia. Code stroke activated. CT head negative for hemorrhage, aspects of 9, mild hyperdensity left MCA could indicate thrombus. Tenecteplase administered. CTA showed left M1 occlusion and neurointerventional radiology contacted. Patient transferred to [facility] for thrombectomy. On route he was noted to have periorbital welts and uvular swelling. Was concern of possible angioedema secondary to tenecteplase. He was intubated for airway protection and underwent cerebral angiogram by Dr. Tommie Sams.  Recanalization of the left MCA vascular tree noted. Critical care medicine consulted for management and placed on low-dose Neo-Synephrine infusion. Extubated on 4/23. 2D echo with EF of approximately 60 to 65% with trivial MVR. LDL 96, hemoglobin A1c 6.3%. He was started on heparin subcutaneously for VTE prophylaxis.     DIAGNOSTIC FINDINGS:    CT Head - 08/14/2022 Mild hyperdensity of the left MCA   CT Head and Neck Angio - 08/14/2022 Proximal occlusion  of the left MCA M1 segment with poor collateralization throughout the MCA territory.   IR Angio - 08/15/2022 Interval recanalization of the left M1/MCA occlusion seen on prior CT angiogram likely as a result of TNK administration. Therefore, no intervention performed.   MRI 08/15/2022 Acute cortical infarcts along the left frontal operculum and left anterior insula with additional small area of acute cortical infarct within left parietal lobe. Other areas of faint cortical DWI hyperintensity throughout the left MCA territory, including the left putamen, may reflect additional areas of infarct or ischemia. 2. Loss of the left transverse sinus flow void is favored to reflect slow flow. If clinical suspicion for thrombosis, CT venography could be performed for further characterization.     PAIN:  Are you having pain? No   FALLS: Has patient fallen in last 6 months?  No   LIVING ENVIRONMENT: Lives with: lives with their family Lives in: House/apartment   PLOF:  Level of assistance: Independent with ADLs, Independent with IADLs Employment: Full-time employment     PATIENT GOALS    to be able to communicate again   SUBJECTIVE STATEMENT: Pt's wife present for session to discuss progress and discharge planning Pt accompanied by: self   OBJECTIVE:   TODAY'S TREATMENT:  Skilled treatment session focused on pt's communication goals. SLP facilitated session by providing the following interventions:  Pt and wife discussed pt's progress towards goals as well as progress towards functional language. Education provided provided on conversation vs communication, likelihood that pt might have to continue supplementing verbal communication with Text to Speech App. Education also provided on difficulty with reading out loud d/t apraxia of speech but great improvement noted with reading comprehension. At this time, continue to recommend pt engage in verbal activities within basketball,  coaching, work and also repetitive functional language drills and conversational opportunities.    PATIENT EDUCATION: Education details: see above Person educated: Patient and his wife via phone -  Education method: Explanation, pt took pictures Education comprehension: verbalized understanding and needs further education   HOME EXERCISE PROGRAM:  Target basketball plays thru repetition and naming of hand signals.  Reading list of words   GOALS:   Goals reviewed with patient? Yes   SHORT TERM GOALS: Target date: 10 sessions   SEE PREVIOUS NOTES FOR PROGRESS TOWARDS GOALS  Updated 01/25/2023  Updated 03/29/2023 6.  With minimal cues, pt will demonstrate improved reading comprehension by following 2 step written directions with 75% accuracy.  Baseline:  Goal status: INITIAL: MET- updated to With supervision level cues, pt will demonstrate improved reading comprehension by following 2 step written directions with 90% accuracy. : MET  7.  With minimal cues, pt will type phrase level basic functional statements (3-4 words in length) in 5 out of 7 opportunities.  Baseline:  Goal status: INITIAL:GOOD PROGRESS MADE 3 OUT OF 7 OPPORTUNITIES typing 2-3 word length: MET - updated to With supervision level cues, pt will type phrase level basic functional statements (3-4 words in length) in 5 out of 7 opportunities.Marland Kitchen  MET  8.  With minimal cues, pt will imitate basic functional phrase level statements with 75% speech intelligibility.  Baseline:  Goal status: INITIAL: GREAT PROGRESS MADE: MET - updated to With supervision level cues, pt will imitate basic functional phrase level statements with 90% speech intelligibility. : MET  LONG TERM GOALS: Target date: 01/26/2023   SEE PREVIOUS NOTES FOR PROGRESS TOWARDS GOALS  Updated 01/25/2023  Updated 03/29/2023  Pt will use multimodal means of communication to express basic biographical information about himself, activities within his day and his  family with minimal assistance: ongoing: great progress made: MET    2.  Pt will demonstrate > 90% speech intelligibility when reading orientation information.  Baseline:  Goal status: INITIAL Goal status: ONGOING; progress made: MET  3. Pt will answer semi-complex yes/no questions with > 75% accuracy given minimal cues.   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 Over the course of the services, pt has made incredible progress in the areas of verbal communication, spelling, reading comprehension, use of app for communication. His verbal communication continues to be nonfluent but he has islands of increased speech intelligibility, has been able to return to work, and Banker. At this time, participating in skilled ST services is not likely to increase his progress. Extensive education has been completed on ways to continue to maximal pt's language ability. All questions have been answered to pt and his wife's satisfaction.       PLAN: Discharge from services.    Khalila Buechner B. Dreama Saa, M.S., CCC-SLP, Tree surgeon Certified Brain Injury Specialist [facility]  [facility] Rehabilitation Services Office [phone] Ascom [phone] Fax [phone]

## 2023-06-12 ENCOUNTER — Ambulatory Visit: Payer: 59 | Admitting: Speech Pathology

## 2023-06-14 ENCOUNTER — Ambulatory Visit: Payer: 59 | Admitting: Speech Pathology

## 2023-06-19 ENCOUNTER — Ambulatory Visit: Payer: 59 | Admitting: Speech Pathology

## 2023-06-21 ENCOUNTER — Ambulatory Visit: Payer: 59 | Admitting: Speech Pathology

## 2023-06-27 ENCOUNTER — Encounter: Payer: 59 | Admitting: Speech Pathology

## 2023-06-28 ENCOUNTER — Encounter: Payer: 59 | Admitting: Speech Pathology

## 2023-07-03 ENCOUNTER — Ambulatory Visit (INDEPENDENT_AMBULATORY_CARE_PROVIDER_SITE_OTHER): Payer: BC Managed Care – PPO

## 2023-07-03 ENCOUNTER — Encounter: Payer: 59 | Admitting: Speech Pathology

## 2023-07-03 DIAGNOSIS — I639 Cerebral infarction, unspecified: Secondary | ICD-10-CM

## 2023-07-04 ENCOUNTER — Other Ambulatory Visit: Payer: Self-pay

## 2023-07-04 LAB — CUP PACEART REMOTE DEVICE CHECK
Date Time Interrogation Session: 20250309232126
Implantable Pulse Generator Implant Date: 20240425

## 2023-07-04 NOTE — Progress Notes (Signed)
 [facility] Summary Report / Loop Recorder

## 2023-07-10 ENCOUNTER — Encounter: Payer: 59 | Admitting: Speech Pathology

## 2023-07-12 ENCOUNTER — Encounter: Payer: 59 | Admitting: Speech Pathology

## 2023-07-17 ENCOUNTER — Encounter: Payer: 59 | Admitting: Speech Pathology

## 2023-07-19 ENCOUNTER — Encounter: Payer: 59 | Admitting: Speech Pathology

## 2023-07-24 ENCOUNTER — Encounter: Payer: 59 | Admitting: Speech Pathology

## 2023-07-26 ENCOUNTER — Encounter: Payer: 59 | Admitting: Speech Pathology

## 2023-07-31 ENCOUNTER — Encounter: Payer: 59 | Admitting: Speech Pathology

## 2023-08-02 ENCOUNTER — Encounter: Payer: 59 | Admitting: Speech Pathology

## 2023-08-03 NOTE — Progress Notes (Signed)
 [facility] Summary Report / Loop Recorder

## 2023-08-03 NOTE — Addendum Note (Signed)
 Addended by: Geralyn Flash D on: 08/03/2023 03:53 PM   Modules accepted: Orders

## 2023-08-07 ENCOUNTER — Encounter: Payer: 59 | Admitting: Speech Pathology

## 2023-08-07 ENCOUNTER — Ambulatory Visit (INDEPENDENT_AMBULATORY_CARE_PROVIDER_SITE_OTHER): Payer: BC Managed Care – PPO

## 2023-08-07 DIAGNOSIS — I639 Cerebral infarction, unspecified: Secondary | ICD-10-CM | POA: Diagnosis not present

## 2023-08-08 LAB — CUP PACEART REMOTE DEVICE CHECK
Date Time Interrogation Session: 20250413231832
Implantable Pulse Generator Implant Date: 20240425

## 2023-08-09 ENCOUNTER — Encounter: Payer: 59 | Admitting: Speech Pathology

## 2023-08-14 ENCOUNTER — Encounter: Payer: 59 | Admitting: Speech Pathology

## 2023-08-16 ENCOUNTER — Encounter: Payer: 59 | Admitting: Speech Pathology

## 2023-08-21 ENCOUNTER — Encounter: Payer: 59 | Admitting: Speech Pathology

## 2023-08-23 ENCOUNTER — Encounter: Payer: 59 | Admitting: Speech Pathology

## 2023-08-28 ENCOUNTER — Encounter: Payer: 59 | Admitting: Speech Pathology

## 2023-08-30 ENCOUNTER — Encounter: Payer: 59 | Admitting: Speech Pathology

## 2023-09-04 ENCOUNTER — Encounter: Payer: 59 | Admitting: Speech Pathology

## 2023-09-11 ENCOUNTER — Ambulatory Visit (INDEPENDENT_AMBULATORY_CARE_PROVIDER_SITE_OTHER): Payer: BC Managed Care – PPO

## 2023-09-11 ENCOUNTER — Ambulatory Visit: Payer: Self-pay | Admitting: Cardiology

## 2023-09-11 DIAGNOSIS — I639 Cerebral infarction, unspecified: Secondary | ICD-10-CM | POA: Diagnosis not present

## 2023-09-11 LAB — CUP PACEART REMOTE DEVICE CHECK
Date Time Interrogation Session: 20250518233042
Implantable Pulse Generator Implant Date: 20240425

## 2023-09-21 NOTE — Addendum Note (Signed)
 Addended by: Edra Govern D on: 09/21/2023 01:44 PM   Modules accepted: Orders

## 2023-09-21 NOTE — Progress Notes (Signed)
 [facility] Summary Report / Loop Recorder

## 2023-10-02 ENCOUNTER — Telehealth: Payer: Self-pay | Admitting: Neurology

## 2023-10-02 NOTE — Telephone Encounter (Signed)
 Spouse called to r/s due to a conflict

## 2023-10-03 ENCOUNTER — Ambulatory Visit: Payer: BC Managed Care – PPO | Admitting: Adult Health

## 2023-10-12 ENCOUNTER — Ambulatory Visit (INDEPENDENT_AMBULATORY_CARE_PROVIDER_SITE_OTHER): Payer: Self-pay

## 2023-10-12 ENCOUNTER — Ambulatory Visit: Payer: Self-pay | Admitting: Cardiology

## 2023-10-12 DIAGNOSIS — I639 Cerebral infarction, unspecified: Secondary | ICD-10-CM | POA: Diagnosis not present

## 2023-10-12 LAB — CUP PACEART REMOTE DEVICE CHECK
Date Time Interrogation Session: 20250618232903
Implantable Pulse Generator Implant Date: 20240425

## 2023-10-20 ENCOUNTER — Encounter (HOSPITAL_COMMUNITY): Payer: Self-pay | Admitting: Interventional Radiology

## 2023-10-26 NOTE — Progress Notes (Signed)
 [facility] Summary Report / Loop Recorder

## 2023-11-13 ENCOUNTER — Ambulatory Visit: Payer: Self-pay

## 2023-11-13 DIAGNOSIS — I639 Cerebral infarction, unspecified: Secondary | ICD-10-CM

## 2023-11-14 LAB — CUP PACEART REMOTE DEVICE CHECK
Date Time Interrogation Session: 20250720233724
Implantable Pulse Generator Implant Date: 20240425

## 2023-11-22 ENCOUNTER — Ambulatory Visit: Payer: Self-pay | Admitting: Cardiology

## 2023-11-27 ENCOUNTER — Ambulatory Visit: Admitting: Adult Health

## 2023-12-13 NOTE — Progress Notes (Signed)
 [facility] Summary Report / Loop Recorder

## 2023-12-14 ENCOUNTER — Ambulatory Visit: Payer: Self-pay | Admitting: Cardiology

## 2023-12-14 ENCOUNTER — Ambulatory Visit (INDEPENDENT_AMBULATORY_CARE_PROVIDER_SITE_OTHER): Payer: Self-pay

## 2023-12-14 DIAGNOSIS — I639 Cerebral infarction, unspecified: Secondary | ICD-10-CM | POA: Diagnosis not present

## 2023-12-14 LAB — CUP PACEART REMOTE DEVICE CHECK
Date Time Interrogation Session: 20250820234743
Implantable Pulse Generator Implant Date: 20240425

## 2023-12-27 ENCOUNTER — Ambulatory Visit: Payer: BC Managed Care – PPO

## 2023-12-27 ENCOUNTER — Other Ambulatory Visit (HOSPITAL_COMMUNITY): Payer: BC Managed Care – PPO

## 2024-01-15 ENCOUNTER — Ambulatory Visit: Payer: Self-pay | Admitting: Cardiology

## 2024-01-15 ENCOUNTER — Ambulatory Visit (INDEPENDENT_AMBULATORY_CARE_PROVIDER_SITE_OTHER): Payer: Self-pay

## 2024-01-15 DIAGNOSIS — I639 Cerebral infarction, unspecified: Secondary | ICD-10-CM | POA: Diagnosis not present

## 2024-01-15 LAB — CUP PACEART REMOTE DEVICE CHECK
Date Time Interrogation Session: 20250921233025
Implantable Pulse Generator Implant Date: 20240425

## 2024-01-16 NOTE — Progress Notes (Signed)
 Remote Loop Recorder Transmission

## 2024-01-17 ENCOUNTER — Ambulatory Visit: Payer: Self-pay | Admitting: Physician Assistant

## 2024-01-17 ENCOUNTER — Ambulatory Visit
Admission: RE | Admit: 2024-01-17 | Discharge: 2024-01-17 | Disposition: A | Discharge status: Home / Self Care | Payer: Self-pay | Source: Ambulatory Visit | Attending: Physician Assistant | Admitting: Physician Assistant

## 2024-01-17 DIAGNOSIS — I2510 Atherosclerotic heart disease of native coronary artery without angina pectoris: Secondary | ICD-10-CM | POA: Insufficient documentation

## 2024-01-17 DIAGNOSIS — E119 Type 2 diabetes mellitus without complications: Secondary | ICD-10-CM | POA: Insufficient documentation

## 2024-01-17 DIAGNOSIS — Q2112 Patent foramen ovale: Secondary | ICD-10-CM | POA: Diagnosis not present

## 2024-01-17 DIAGNOSIS — E785 Hyperlipidemia, unspecified: Secondary | ICD-10-CM | POA: Insufficient documentation

## 2024-01-17 DIAGNOSIS — I119 Hypertensive heart disease without heart failure: Secondary | ICD-10-CM | POA: Diagnosis not present

## 2024-01-17 DIAGNOSIS — G4730 Sleep apnea, unspecified: Secondary | ICD-10-CM | POA: Insufficient documentation

## 2024-01-17 DIAGNOSIS — I340 Nonrheumatic mitral (valve) insufficiency: Secondary | ICD-10-CM | POA: Insufficient documentation

## 2024-01-17 DIAGNOSIS — Z8774 Personal history of (corrected) congenital malformations of heart and circulatory system: Secondary | ICD-10-CM | POA: Insufficient documentation

## 2024-01-17 LAB — ECHOCARDIOGRAM LIMITED BUBBLE STUDY: Area-P 1/2: 3.6 cm2

## 2024-01-17 NOTE — Progress Notes (Signed)
  Echocardiogram 2D Echocardiogram has been performed.  [name] 01/17/2024, 9:40 AM

## 2024-01-29 NOTE — Progress Notes (Signed)
 Remote Loop Recorder Transmission

## 2024-02-15 ENCOUNTER — Ambulatory Visit

## 2024-02-15 DIAGNOSIS — I639 Cerebral infarction, unspecified: Secondary | ICD-10-CM

## 2024-02-15 LAB — CUP PACEART REMOTE DEVICE CHECK
Date Time Interrogation Session: 20251022232641
Implantable Pulse Generator Implant Date: 20240425

## 2024-02-16 ENCOUNTER — Ambulatory Visit: Payer: Self-pay | Admitting: Cardiology

## 2024-02-16 NOTE — Progress Notes (Signed)
 Remote Loop Recorder Transmission

## 2024-02-21 ENCOUNTER — Other Ambulatory Visit: Payer: Self-pay | Admitting: Cardiovascular Disease

## 2024-03-06 ENCOUNTER — Ambulatory Visit: Admitting: Adult Health

## 2024-03-17 ENCOUNTER — Ambulatory Visit

## 2024-03-17 DIAGNOSIS — I63511 Cerebral infarction due to unspecified occlusion or stenosis of right middle cerebral artery: Secondary | ICD-10-CM

## 2024-03-19 LAB — CUP PACEART REMOTE DEVICE CHECK
Date Time Interrogation Session: 20251122232603
Implantable Pulse Generator Implant Date: 20240425

## 2024-03-19 NOTE — Progress Notes (Signed)
 Remote Loop Recorder Transmission

## 2024-03-20 ENCOUNTER — Ambulatory Visit: Payer: Self-pay | Admitting: Cardiology

## 2024-04-17 ENCOUNTER — Ambulatory Visit: Admitting: Adult Health

## 2024-04-17 ENCOUNTER — Ambulatory Visit

## 2024-04-17 DIAGNOSIS — I63511 Cerebral infarction due to unspecified occlusion or stenosis of right middle cerebral artery: Secondary | ICD-10-CM | POA: Diagnosis not present

## 2024-04-18 LAB — CUP PACEART REMOTE DEVICE CHECK
Date Time Interrogation Session: 20251223231818
Implantable Pulse Generator Implant Date: 20240425

## 2024-04-19 ENCOUNTER — Ambulatory Visit: Payer: Self-pay | Admitting: Cardiology

## 2024-04-19 NOTE — Progress Notes (Signed)
 Remote Loop Recorder Transmission

## 2024-05-18 ENCOUNTER — Ambulatory Visit: Attending: Cardiology

## 2024-05-18 DIAGNOSIS — I63511 Cerebral infarction due to unspecified occlusion or stenosis of right middle cerebral artery: Secondary | ICD-10-CM

## 2024-05-20 LAB — CUP PACEART REMOTE DEVICE CHECK
Date Time Interrogation Session: 20260123231548
Implantable Pulse Generator Implant Date: 20240425

## 2024-05-21 ENCOUNTER — Ambulatory Visit: Payer: Self-pay | Admitting: Cardiology

## 2024-05-22 NOTE — Progress Notes (Signed)
 Remote Loop Recorder Transmission

## 2024-06-18 ENCOUNTER — Ambulatory Visit

## 2024-06-20 ENCOUNTER — Ambulatory Visit: Admitting: Adult Health

## 2024-07-19 ENCOUNTER — Ambulatory Visit

## 2024-08-19 ENCOUNTER — Ambulatory Visit
